# Patient Record
Sex: Female | Born: 1935 | Race: White | Hispanic: No | Smoking: Former smoker
Health system: Southern US, Community
[De-identification: ages and names within clinical notes are randomized; demographics above are authoritative.]

## PROBLEM LIST (undated history)

## (undated) DIAGNOSIS — J479 Bronchiectasis, uncomplicated: Secondary | ICD-10-CM

## (undated) DIAGNOSIS — I493 Ventricular premature depolarization: Secondary | ICD-10-CM

## (undated) DIAGNOSIS — K219 Gastro-esophageal reflux disease without esophagitis: Secondary | ICD-10-CM

## (undated) DIAGNOSIS — J40 Bronchitis, not specified as acute or chronic: Secondary | ICD-10-CM

## (undated) DIAGNOSIS — M1710 Unilateral primary osteoarthritis, unspecified knee: Secondary | ICD-10-CM

## (undated) DIAGNOSIS — H9190 Unspecified hearing loss, unspecified ear: Secondary | ICD-10-CM

## (undated) DIAGNOSIS — I4891 Unspecified atrial fibrillation: Secondary | ICD-10-CM

## (undated) DIAGNOSIS — G4700 Insomnia, unspecified: Secondary | ICD-10-CM

## (undated) DIAGNOSIS — Z9289 Personal history of other medical treatment: Secondary | ICD-10-CM

## (undated) DIAGNOSIS — M179 Osteoarthritis of knee, unspecified: Secondary | ICD-10-CM

## (undated) DIAGNOSIS — H9319 Tinnitus, unspecified ear: Secondary | ICD-10-CM

## (undated) HISTORY — PX: OTHER SURGICAL HISTORY: SHX169

## (undated) HISTORY — DX: Gastro-esophageal reflux disease without esophagitis: K21.9

## (undated) HISTORY — DX: Bronchitis, not specified as acute or chronic: J40

## (undated) HISTORY — PX: BREAST EXCISIONAL BIOPSY: SUR124

## (undated) HISTORY — DX: Personal history of other medical treatment: Z92.89

## (undated) HISTORY — DX: Unspecified hearing loss, unspecified ear: H91.90

## (undated) HISTORY — DX: Tinnitus, unspecified ear: H93.19

## (undated) HISTORY — DX: Unilateral primary osteoarthritis, unspecified knee: M17.10

## (undated) HISTORY — DX: Osteoarthritis of knee, unspecified: M17.9

## (undated) HISTORY — DX: Insomnia, unspecified: G47.00

## (undated) HISTORY — DX: Bronchiectasis, uncomplicated: J47.9

## (undated) HISTORY — DX: Ventricular premature depolarization: I49.3

---

## 1999-08-25 ENCOUNTER — Encounter: Payer: Self-pay | Admitting: Family Medicine

## 1999-08-25 ENCOUNTER — Encounter: Admission: RE | Admit: 1999-08-25 | Discharge: 1999-08-25 | Payer: Self-pay | Admitting: Family Medicine

## 1999-12-02 ENCOUNTER — Other Ambulatory Visit: Admission: RE | Admit: 1999-12-02 | Discharge: 1999-12-02 | Payer: Self-pay | Admitting: Internal Medicine

## 1999-12-06 ENCOUNTER — Encounter: Admission: RE | Admit: 1999-12-06 | Discharge: 1999-12-06 | Payer: Self-pay | Admitting: Internal Medicine

## 1999-12-06 ENCOUNTER — Encounter: Payer: Self-pay | Admitting: Internal Medicine

## 2000-08-27 ENCOUNTER — Encounter: Admission: RE | Admit: 2000-08-27 | Discharge: 2000-08-27 | Payer: Self-pay | Admitting: Internal Medicine

## 2000-08-27 ENCOUNTER — Encounter: Payer: Self-pay | Admitting: Internal Medicine

## 2000-08-29 ENCOUNTER — Encounter: Admission: RE | Admit: 2000-08-29 | Discharge: 2000-08-29 | Payer: Self-pay | Admitting: Internal Medicine

## 2000-08-29 ENCOUNTER — Encounter: Payer: Self-pay | Admitting: Internal Medicine

## 2000-12-03 ENCOUNTER — Other Ambulatory Visit: Admission: RE | Admit: 2000-12-03 | Discharge: 2000-12-03 | Payer: Self-pay | Admitting: Internal Medicine

## 2001-02-14 ENCOUNTER — Ambulatory Visit (HOSPITAL_COMMUNITY): Admission: RE | Admit: 2001-02-14 | Discharge: 2001-02-14 | Payer: Self-pay | Admitting: Gastroenterology

## 2001-09-02 ENCOUNTER — Encounter: Admission: RE | Admit: 2001-09-02 | Discharge: 2001-09-02 | Payer: Self-pay | Admitting: Internal Medicine

## 2001-09-02 ENCOUNTER — Encounter: Payer: Self-pay | Admitting: Internal Medicine

## 2002-09-11 ENCOUNTER — Encounter: Payer: Self-pay | Admitting: Internal Medicine

## 2002-09-11 ENCOUNTER — Encounter: Admission: RE | Admit: 2002-09-11 | Discharge: 2002-09-11 | Payer: Self-pay | Admitting: Internal Medicine

## 2002-12-09 ENCOUNTER — Other Ambulatory Visit: Admission: RE | Admit: 2002-12-09 | Discharge: 2002-12-09 | Payer: Self-pay | Admitting: Internal Medicine

## 2003-09-14 ENCOUNTER — Encounter: Admission: RE | Admit: 2003-09-14 | Discharge: 2003-09-14 | Payer: Self-pay | Admitting: Internal Medicine

## 2004-05-27 ENCOUNTER — Encounter: Admission: RE | Admit: 2004-05-27 | Discharge: 2004-05-27 | Payer: Self-pay | Admitting: Internal Medicine

## 2004-10-05 ENCOUNTER — Encounter: Admission: RE | Admit: 2004-10-05 | Discharge: 2004-10-05 | Payer: Self-pay | Admitting: Internal Medicine

## 2004-12-12 ENCOUNTER — Other Ambulatory Visit: Admission: RE | Admit: 2004-12-12 | Discharge: 2004-12-12 | Payer: Self-pay | Admitting: Internal Medicine

## 2005-12-05 ENCOUNTER — Encounter: Admission: RE | Admit: 2005-12-05 | Discharge: 2005-12-05 | Payer: Self-pay | Admitting: Internal Medicine

## 2006-01-15 ENCOUNTER — Encounter: Admission: RE | Admit: 2006-01-15 | Discharge: 2006-01-15 | Payer: Self-pay | Admitting: Internal Medicine

## 2006-03-06 ENCOUNTER — Encounter: Admission: RE | Admit: 2006-03-06 | Discharge: 2006-03-06 | Payer: Self-pay | Admitting: Internal Medicine

## 2006-03-29 DIAGNOSIS — Z9289 Personal history of other medical treatment: Secondary | ICD-10-CM

## 2006-03-29 HISTORY — DX: Personal history of other medical treatment: Z92.89

## 2006-12-10 ENCOUNTER — Encounter: Admission: RE | Admit: 2006-12-10 | Discharge: 2006-12-10 | Payer: Self-pay | Admitting: Internal Medicine

## 2006-12-18 ENCOUNTER — Other Ambulatory Visit: Admission: RE | Admit: 2006-12-18 | Discharge: 2006-12-18 | Payer: Self-pay | Admitting: Internal Medicine

## 2007-12-17 ENCOUNTER — Encounter: Admission: RE | Admit: 2007-12-17 | Discharge: 2007-12-17 | Payer: Self-pay | Admitting: Internal Medicine

## 2008-12-21 ENCOUNTER — Encounter: Admission: RE | Admit: 2008-12-21 | Discharge: 2008-12-21 | Payer: Self-pay | Admitting: Internal Medicine

## 2009-02-19 ENCOUNTER — Other Ambulatory Visit: Admission: RE | Admit: 2009-02-19 | Discharge: 2009-02-19 | Payer: Self-pay | Admitting: Internal Medicine

## 2009-03-31 ENCOUNTER — Inpatient Hospital Stay (HOSPITAL_BASED_OUTPATIENT_CLINIC_OR_DEPARTMENT_OTHER): Admission: RE | Admit: 2009-03-31 | Discharge: 2009-03-31 | Payer: Self-pay | Admitting: Interventional Cardiology

## 2009-12-27 ENCOUNTER — Encounter: Admission: RE | Admit: 2009-12-27 | Discharge: 2009-12-27 | Payer: Self-pay | Admitting: Internal Medicine

## 2010-10-14 NOTE — Procedures (Signed)
[place]  Patient:    [name], [name] Visit Number: [id number] MRN: [id number]          Service Type: Attending:  Verlin Grills, M.D. Dictated by:   Verlin Grills, M.D. Proc. Date: 02/14/01   CC:         Pearla Dubonnet, M.D.   Procedure Report  DATE OF BIRTH:  December 26, 1930  REFERRING PHYSICIAN:  Pearla Dubonnet, M.D.  PROCEDURE PERFORMED:  Screening colonoscopy to the hepatic flexure.  ENDOSCOPIST:  Verlin Grills, M.D.  INDICATIONS FOR PROCEDURE:  The patient is a 77 year old female who is referred for her first screening colonoscopy with possible polypectomy to prevent colon cancer.  PREMEDICATION:  Fentanyl 50 mcg, Versed 7 mg.  ENDOSCOPE:  Olympus pediatric video colonoscope.  DESCRIPTION OF PROCEDURE:  After obtaining informed consent, the patient was placed in the left lateral decubitus position.  I administered intravenous Versed and intravenous Versed to achieve conscious sedation for the procedure.  The patients blood pressure, oxygen saturation and cardiac rhythm were monitored throughout the procedure and documented in the medical record.  Anal inspection was normal.  Digital rectal exam was normal.  The Olympus pediatric video colonoscope was then introduced into the rectum and under direct vision, advanced to the hepatic flexure.  Due to colonic loop formation, I was unable to intubate the right colon and I was unable to examine the ascending colon, cecum or ileocecal valve.  Colonic preparation for the exam today was excellent.  Rectum:  Normal.  Sigmoid colon and descending colon:  Normal.  Splenic flexure:  Normal.  Transverse colon:  Normal.  Hepatic flexure:  Normal.  ASSESSMENT:  Normal screening proctocolonoscopy to the hepatic flexure.  No endoscopic evidence for the presence of colorectal neoplasia. Dictated by:   Verlin Grills, M.D. Attending:  Verlin Grills, M.D. DD:  02/14/01 TD:  02/14/01 Job: [id number]

## 2010-11-28 ENCOUNTER — Other Ambulatory Visit: Payer: Self-pay | Admitting: Internal Medicine

## 2010-11-28 DIAGNOSIS — Z1231 Encounter for screening mammogram for malignant neoplasm of breast: Secondary | ICD-10-CM

## 2011-01-02 ENCOUNTER — Ambulatory Visit
Admission: RE | Admit: 2011-01-02 | Discharge: 2011-01-02 | Disposition: A | Discharge status: Home / Self Care | Payer: PRIVATE HEALTH INSURANCE | Source: Ambulatory Visit | Attending: Internal Medicine | Admitting: Internal Medicine

## 2011-01-02 DIAGNOSIS — Z1231 Encounter for screening mammogram for malignant neoplasm of breast: Secondary | ICD-10-CM

## 2011-04-07 ENCOUNTER — Other Ambulatory Visit: Payer: Self-pay | Admitting: Internal Medicine

## 2011-04-07 ENCOUNTER — Ambulatory Visit
Admission: RE | Admit: 2011-04-07 | Discharge: 2011-04-07 | Disposition: A | Discharge status: Home / Self Care | Payer: Medicare Other | Source: Ambulatory Visit | Attending: Internal Medicine | Admitting: Internal Medicine

## 2011-06-13 DIAGNOSIS — R05 Cough: Secondary | ICD-10-CM | POA: Diagnosis not present

## 2011-07-07 DIAGNOSIS — IMO0002 Reserved for concepts with insufficient information to code with codable children: Secondary | ICD-10-CM | POA: Insufficient documentation

## 2011-07-07 DIAGNOSIS — H2510 Age-related nuclear cataract, unspecified eye: Secondary | ICD-10-CM | POA: Diagnosis not present

## 2011-09-07 DIAGNOSIS — I10 Essential (primary) hypertension: Secondary | ICD-10-CM | POA: Diagnosis not present

## 2011-09-07 DIAGNOSIS — E782 Mixed hyperlipidemia: Secondary | ICD-10-CM | POA: Diagnosis not present

## 2011-09-07 DIAGNOSIS — Z1331 Encounter for screening for depression: Secondary | ICD-10-CM | POA: Diagnosis not present

## 2011-09-07 DIAGNOSIS — Z0000 Encounter for general adult medical examination without abnormal findings: Secondary | ICD-10-CM | POA: Diagnosis not present

## 2011-09-07 DIAGNOSIS — J479 Bronchiectasis, uncomplicated: Secondary | ICD-10-CM | POA: Diagnosis not present

## 2011-09-07 DIAGNOSIS — H9190 Unspecified hearing loss, unspecified ear: Secondary | ICD-10-CM | POA: Diagnosis not present

## 2011-09-07 DIAGNOSIS — M1990 Unspecified osteoarthritis, unspecified site: Secondary | ICD-10-CM | POA: Diagnosis not present

## 2011-09-07 DIAGNOSIS — Z79899 Other long term (current) drug therapy: Secondary | ICD-10-CM | POA: Diagnosis not present

## 2011-09-07 DIAGNOSIS — E559 Vitamin D deficiency, unspecified: Secondary | ICD-10-CM | POA: Diagnosis not present

## 2011-12-05 ENCOUNTER — Other Ambulatory Visit: Payer: Self-pay | Admitting: Internal Medicine

## 2011-12-05 DIAGNOSIS — Z1231 Encounter for screening mammogram for malignant neoplasm of breast: Secondary | ICD-10-CM

## 2012-01-08 ENCOUNTER — Ambulatory Visit
Admission: RE | Admit: 2012-01-08 | Discharge: 2012-01-08 | Disposition: A | Discharge status: Home / Self Care | Payer: Medicare Other | Source: Ambulatory Visit | Attending: Internal Medicine | Admitting: Internal Medicine

## 2012-01-08 DIAGNOSIS — Z1231 Encounter for screening mammogram for malignant neoplasm of breast: Secondary | ICD-10-CM | POA: Diagnosis not present

## 2012-03-25 DIAGNOSIS — I8390 Asymptomatic varicose veins of unspecified lower extremity: Secondary | ICD-10-CM | POA: Diagnosis not present

## 2012-03-25 DIAGNOSIS — H6120 Impacted cerumen, unspecified ear: Secondary | ICD-10-CM | POA: Diagnosis not present

## 2012-03-25 DIAGNOSIS — Z23 Encounter for immunization: Secondary | ICD-10-CM | POA: Diagnosis not present

## 2012-07-15 DIAGNOSIS — R0789 Other chest pain: Secondary | ICD-10-CM | POA: Diagnosis not present

## 2012-07-15 DIAGNOSIS — R634 Abnormal weight loss: Secondary | ICD-10-CM | POA: Diagnosis not present

## 2012-07-15 DIAGNOSIS — R002 Palpitations: Secondary | ICD-10-CM | POA: Diagnosis not present

## 2012-07-17 DIAGNOSIS — R002 Palpitations: Secondary | ICD-10-CM | POA: Diagnosis not present

## 2012-07-18 DIAGNOSIS — R002 Palpitations: Secondary | ICD-10-CM | POA: Diagnosis not present

## 2012-07-18 DIAGNOSIS — R0789 Other chest pain: Secondary | ICD-10-CM | POA: Diagnosis not present

## 2012-07-18 DIAGNOSIS — R634 Abnormal weight loss: Secondary | ICD-10-CM | POA: Diagnosis not present

## 2012-07-19 DIAGNOSIS — I491 Atrial premature depolarization: Secondary | ICD-10-CM | POA: Diagnosis not present

## 2012-07-19 DIAGNOSIS — I6529 Occlusion and stenosis of unspecified carotid artery: Secondary | ICD-10-CM | POA: Diagnosis not present

## 2012-07-19 DIAGNOSIS — R079 Chest pain, unspecified: Secondary | ICD-10-CM | POA: Diagnosis not present

## 2012-07-19 DIAGNOSIS — R002 Palpitations: Secondary | ICD-10-CM | POA: Diagnosis not present

## 2012-08-21 DIAGNOSIS — I491 Atrial premature depolarization: Secondary | ICD-10-CM | POA: Diagnosis not present

## 2012-08-21 DIAGNOSIS — R079 Chest pain, unspecified: Secondary | ICD-10-CM | POA: Diagnosis not present

## 2012-09-06 DIAGNOSIS — H2510 Age-related nuclear cataract, unspecified eye: Secondary | ICD-10-CM | POA: Diagnosis not present

## 2012-09-30 DIAGNOSIS — Z1331 Encounter for screening for depression: Secondary | ICD-10-CM | POA: Diagnosis not present

## 2012-09-30 DIAGNOSIS — I10 Essential (primary) hypertension: Secondary | ICD-10-CM | POA: Diagnosis not present

## 2012-09-30 DIAGNOSIS — E782 Mixed hyperlipidemia: Secondary | ICD-10-CM | POA: Diagnosis not present

## 2012-09-30 DIAGNOSIS — I491 Atrial premature depolarization: Secondary | ICD-10-CM | POA: Diagnosis not present

## 2012-09-30 DIAGNOSIS — J479 Bronchiectasis, uncomplicated: Secondary | ICD-10-CM | POA: Diagnosis not present

## 2012-09-30 DIAGNOSIS — Z0000 Encounter for general adult medical examination without abnormal findings: Secondary | ICD-10-CM | POA: Diagnosis not present

## 2012-09-30 DIAGNOSIS — M1990 Unspecified osteoarthritis, unspecified site: Secondary | ICD-10-CM | POA: Diagnosis not present

## 2012-09-30 DIAGNOSIS — I8390 Asymptomatic varicose veins of unspecified lower extremity: Secondary | ICD-10-CM | POA: Diagnosis not present

## 2012-09-30 DIAGNOSIS — E559 Vitamin D deficiency, unspecified: Secondary | ICD-10-CM | POA: Diagnosis not present

## 2012-11-01 DIAGNOSIS — D485 Neoplasm of uncertain behavior of skin: Secondary | ICD-10-CM | POA: Diagnosis not present

## 2012-11-01 DIAGNOSIS — L219 Seborrheic dermatitis, unspecified: Secondary | ICD-10-CM | POA: Diagnosis not present

## 2012-12-16 ENCOUNTER — Other Ambulatory Visit: Payer: Self-pay

## 2012-12-16 DIAGNOSIS — Z1231 Encounter for screening mammogram for malignant neoplasm of breast: Secondary | ICD-10-CM

## 2013-01-21 ENCOUNTER — Ambulatory Visit: Payer: Medicare Other

## 2013-01-29 ENCOUNTER — Ambulatory Visit
Admission: RE | Admit: 2013-01-29 | Discharge: 2013-01-29 | Disposition: A | Discharge status: Home / Self Care | Payer: Medicare Other | Source: Ambulatory Visit

## 2013-01-29 DIAGNOSIS — Z1231 Encounter for screening mammogram for malignant neoplasm of breast: Secondary | ICD-10-CM

## 2013-03-15 DIAGNOSIS — Z23 Encounter for immunization: Secondary | ICD-10-CM | POA: Diagnosis not present

## 2013-04-10 ENCOUNTER — Encounter: Payer: Self-pay | Admitting: Interventional Cardiology

## 2013-07-04 DIAGNOSIS — R002 Palpitations: Secondary | ICD-10-CM | POA: Diagnosis not present

## 2013-07-04 DIAGNOSIS — F411 Generalized anxiety disorder: Secondary | ICD-10-CM | POA: Diagnosis not present

## 2013-07-18 ENCOUNTER — Encounter: Payer: Self-pay | Admitting: Cardiology

## 2013-07-18 ENCOUNTER — Ambulatory Visit: Payer: Medicare Other | Admitting: Interventional Cardiology

## 2013-07-18 DIAGNOSIS — I491 Atrial premature depolarization: Secondary | ICD-10-CM | POA: Insufficient documentation

## 2013-07-18 DIAGNOSIS — I6529 Occlusion and stenosis of unspecified carotid artery: Secondary | ICD-10-CM

## 2013-07-22 ENCOUNTER — Ambulatory Visit: Payer: Medicare Other | Admitting: Interventional Cardiology

## 2013-07-29 ENCOUNTER — Other Ambulatory Visit: Payer: Self-pay | Admitting: Interventional Cardiology

## 2013-09-08 DIAGNOSIS — H5200 Hypermetropia, unspecified eye: Secondary | ICD-10-CM | POA: Diagnosis not present

## 2013-09-08 DIAGNOSIS — H01009 Unspecified blepharitis unspecified eye, unspecified eyelid: Secondary | ICD-10-CM | POA: Diagnosis not present

## 2013-09-08 DIAGNOSIS — H2589 Other age-related cataract: Secondary | ICD-10-CM | POA: Diagnosis not present

## 2013-09-08 DIAGNOSIS — H04129 Dry eye syndrome of unspecified lacrimal gland: Secondary | ICD-10-CM | POA: Diagnosis not present

## 2013-09-08 DIAGNOSIS — H524 Presbyopia: Secondary | ICD-10-CM | POA: Diagnosis not present

## 2013-09-08 DIAGNOSIS — H52209 Unspecified astigmatism, unspecified eye: Secondary | ICD-10-CM | POA: Diagnosis not present

## 2013-09-11 ENCOUNTER — Ambulatory Visit (INDEPENDENT_AMBULATORY_CARE_PROVIDER_SITE_OTHER): Payer: Medicare Other | Admitting: Interventional Cardiology

## 2013-09-11 ENCOUNTER — Encounter: Payer: Self-pay | Admitting: Interventional Cardiology

## 2013-09-11 VITALS — BP 150/64 | HR 62 | Ht 62.0 in | Wt 136.0 lb

## 2013-09-11 DIAGNOSIS — I6529 Occlusion and stenosis of unspecified carotid artery: Secondary | ICD-10-CM

## 2013-09-11 DIAGNOSIS — R002 Palpitations: Secondary | ICD-10-CM

## 2013-09-11 DIAGNOSIS — I491 Atrial premature depolarization: Secondary | ICD-10-CM | POA: Diagnosis not present

## 2013-09-11 NOTE — Progress Notes (Signed)
Patient ID: [name], female   DOB: April 11, 1931, 80 y.o.   MRN: [id number]    [place], [place], [place]  [address] Phone: [phone] Fax:  [phone]  Date:  09/11/2013   ID:  [name], DOB [birth year], MRN [id number]  PCP:  Henrine Screws, MD      History of Present Illness: [name] is a 80 y.o. female who has had a clean cath in 2010. She has had pounding in her chest over the past few months. It is worse in the mornings. We changed your atenolol to metoprolol last year. She had a lifeline screening that reported AFib, but I reviewed the strips and she was in NSR in 2014.   She denies syncope or lightheadedness, but reports palpitations. Sometimes, she feels that she is choking. She does housework and walks up stairs. She has no chest pain but some mild SHOB. She had mild carotid disease on the u/s by lifeline.    Wt Readings from Last 3 Encounters:  09/11/13 136 lb (61.689 kg)     Past Medical History  Diagnosis Date  . DJD (degenerative joint disease) of knee   . Bronchiectasis     with multiple pneumonia back in 1990's  . Bronchitis     episodic  . PVC's (premature ventricular contractions)     on EKG, controlled on atenolol and caffeine reduction  . History of nuclear stress test 11/07    NORMAL  . GERD (gastroesophageal reflux disease)     episodic symptoms  . Hearing loss     chronic moderate hearing loss, worked up by ENT and hearing aids have been recommended  . Tinnitus     chronic  . Insomnia     Current Outpatient Prescriptions  Medication Sig Dispense Refill  . aspirin 81 MG tablet Take 81 mg by mouth daily.      . Cholecalciferol (VITAMIN D) 2000 UNITS CAPS Take 2,000 Units by mouth daily.      Marland Kitchen LORazepam (ATIVAN) 0.5 MG tablet Take 0.5 mg by mouth every 8 (eight) hours.      . metoprolol tartrate (LOPRESSOR) 25 MG tablet TAKE 1 TABLET BY MOUTH TWICE DAILY.  60 tablet  1   No current facility-administered  medications for this visit.    Allergies:    Allergies  Allergen Reactions  . Augmentin [Amoxicillin-Pot Clavulanate] Nausea And Vomiting  . Biaxin [Clarithromycin] Nausea And Vomiting  . Tequin [Gatifloxacin] Nausea Only    Social History:  The patient  reports that she has quit smoking. She does not have any smokeless tobacco history on file.   Family History:  The patient's family history is not on file.   ROS:  Please see the history of present illness.  No nausea, vomiting.  No fevers, chills.  No focal weakness.  No dysuria.    All other systems reviewed and negative.   PHYSICAL EXAM: VS:  BP 150/64  Pulse 62  Ht 5\' 2"  (1.575 m)  Wt 136 lb (61.689 kg)  BMI 24.87 kg/m2 Well nourished, well developed, in no acute distress HEENT: normal Neck: no JVD, no carotid bruits Cardiac:  normal S1, S2; RRR;  Lungs:  clear to auscultation bilaterally, no wheezing, rhonchi or rales Abd: soft, nontender, no hepatomegaly Ext: no edema Skin: warm and dry Neuro:   no focal abnormalities noted  EKG:  NSR, NSST in 3/15  ASSESSMENT AND PLAN:  Chest pain, unspecified : Resolved. Prior  negative stress test.  Negative cath in 2010.  LDL 92 in 5/14. 2. PACs  Stopped Atenolol Tablet, 25 MG, 1 tablet, Orally, BID in 2014 Continue Metoprolol Tartrate Tablet, 25 MG, 1 tablet, Orally, Twice a day, 30 day(s), 60, Refills 11 she feels the palpitations are more frequent. Short run of atrial tachycardia noted on her Holter monitor in 2014.she is willing to try a different beta blocker. lifeline screening reviewed. They reported atrial fibrillation, but I personally reviewed ECG. It  Was normal sinus rhythm. Stress may be the cause of her current palpitations. She has had some improvement with lorazepam. Asked her to talk to Dr. Inda Merlin about long-term antianxiety therapy.  3. Carotid Artery Disease  Lifeline screening reviewed. Mild carotid artery disease without significant stenosis.    Signed, Mina Marble, MD, [facility] 09/11/2013 5:47 PM

## 2013-09-11 NOTE — Patient Instructions (Signed)
Your physician recommends that you continue on your current medications as directed. Please refer to the Current Medication list given to you today.  Your physician wants you to follow-up in: 1 year with Dr. Varanasi. You will receive a reminder letter in the mail two months in advance. If you don't receive a letter, please call our office to schedule the follow-up appointment.  

## 2013-09-29 ENCOUNTER — Other Ambulatory Visit: Payer: Self-pay | Admitting: Interventional Cardiology

## 2013-10-07 DIAGNOSIS — E782 Mixed hyperlipidemia: Secondary | ICD-10-CM | POA: Diagnosis not present

## 2013-10-07 DIAGNOSIS — I491 Atrial premature depolarization: Secondary | ICD-10-CM | POA: Diagnosis not present

## 2013-10-07 DIAGNOSIS — I10 Essential (primary) hypertension: Secondary | ICD-10-CM | POA: Diagnosis not present

## 2013-10-07 DIAGNOSIS — E559 Vitamin D deficiency, unspecified: Secondary | ICD-10-CM | POA: Diagnosis not present

## 2013-10-07 DIAGNOSIS — R002 Palpitations: Secondary | ICD-10-CM | POA: Diagnosis not present

## 2013-10-07 DIAGNOSIS — Z1331 Encounter for screening for depression: Secondary | ICD-10-CM | POA: Diagnosis not present

## 2013-10-07 DIAGNOSIS — I8390 Asymptomatic varicose veins of unspecified lower extremity: Secondary | ICD-10-CM | POA: Diagnosis not present

## 2013-10-07 DIAGNOSIS — Z23 Encounter for immunization: Secondary | ICD-10-CM | POA: Diagnosis not present

## 2013-10-07 DIAGNOSIS — Z0000 Encounter for general adult medical examination without abnormal findings: Secondary | ICD-10-CM | POA: Diagnosis not present

## 2013-12-29 ENCOUNTER — Other Ambulatory Visit: Payer: Self-pay

## 2013-12-29 DIAGNOSIS — Z1231 Encounter for screening mammogram for malignant neoplasm of breast: Secondary | ICD-10-CM

## 2014-01-13 DIAGNOSIS — I498 Other specified cardiac arrhythmias: Secondary | ICD-10-CM | POA: Diagnosis not present

## 2014-01-13 DIAGNOSIS — F411 Generalized anxiety disorder: Secondary | ICD-10-CM | POA: Diagnosis not present

## 2014-01-13 DIAGNOSIS — I10 Essential (primary) hypertension: Secondary | ICD-10-CM | POA: Diagnosis not present

## 2014-01-13 DIAGNOSIS — R002 Palpitations: Secondary | ICD-10-CM | POA: Diagnosis not present

## 2014-02-03 ENCOUNTER — Ambulatory Visit
Admission: RE | Admit: 2014-02-03 | Discharge: 2014-02-03 | Disposition: A | Discharge status: Home / Self Care | Payer: Medicare Other | Source: Ambulatory Visit

## 2014-02-03 DIAGNOSIS — Z1231 Encounter for screening mammogram for malignant neoplasm of breast: Secondary | ICD-10-CM

## 2014-02-20 ENCOUNTER — Other Ambulatory Visit: Payer: Self-pay | Admitting: Internal Medicine

## 2014-02-20 ENCOUNTER — Ambulatory Visit
Admission: RE | Admit: 2014-02-20 | Discharge: 2014-02-20 | Disposition: A | Discharge status: Home / Self Care | Payer: Medicare Other | Source: Ambulatory Visit | Attending: Internal Medicine | Admitting: Internal Medicine

## 2014-02-20 DIAGNOSIS — J984 Other disorders of lung: Secondary | ICD-10-CM | POA: Diagnosis not present

## 2014-02-20 DIAGNOSIS — R079 Chest pain, unspecified: Secondary | ICD-10-CM

## 2014-02-20 DIAGNOSIS — Z87891 Personal history of nicotine dependence: Secondary | ICD-10-CM | POA: Diagnosis not present

## 2014-02-20 DIAGNOSIS — J449 Chronic obstructive pulmonary disease, unspecified: Secondary | ICD-10-CM | POA: Diagnosis not present

## 2014-03-27 DIAGNOSIS — Z23 Encounter for immunization: Secondary | ICD-10-CM | POA: Diagnosis not present

## 2014-04-10 ENCOUNTER — Other Ambulatory Visit: Payer: Self-pay | Admitting: Interventional Cardiology

## 2014-08-17 DIAGNOSIS — L237 Allergic contact dermatitis due to plants, except food: Secondary | ICD-10-CM | POA: Diagnosis not present

## 2014-09-09 DIAGNOSIS — H5201 Hypermetropia, right eye: Secondary | ICD-10-CM | POA: Diagnosis not present

## 2014-09-09 DIAGNOSIS — H5202 Hypermetropia, left eye: Secondary | ICD-10-CM | POA: Diagnosis not present

## 2014-09-09 DIAGNOSIS — H25813 Combined forms of age-related cataract, bilateral: Secondary | ICD-10-CM | POA: Diagnosis not present

## 2014-10-07 ENCOUNTER — Encounter: Payer: Self-pay | Admitting: Interventional Cardiology

## 2014-10-07 ENCOUNTER — Ambulatory Visit (INDEPENDENT_AMBULATORY_CARE_PROVIDER_SITE_OTHER): Payer: Medicare Other | Admitting: Interventional Cardiology

## 2014-10-07 VITALS — BP 158/82 | HR 59 | Ht 62.0 in | Wt 133.0 lb

## 2014-10-07 DIAGNOSIS — I491 Atrial premature depolarization: Secondary | ICD-10-CM

## 2014-10-07 DIAGNOSIS — R002 Palpitations: Secondary | ICD-10-CM

## 2014-10-07 NOTE — Progress Notes (Signed)
Patient ID: [name], female   DOB: 1931/02/21, 81 y.o.   MRN: [id number]     Cardiology Office Note   Date:  10/07/2014   ID:  [name], DOB Dec 28, 1930, MRN [id number]  PCP:  Henrine Screws, MD    No chief complaint on file.    Wt Readings from Last 3 Encounters:  10/07/14 133 lb (60.328 kg)  09/11/13 136 lb (61.689 kg)       History of Present Illness: [name] is a 81 y.o. female  who has had a clean cath in 2010. She has had pounding in her chest over the past few months. No longer worse in the mornings. She had a lifeline screening that reported AFib, but I reviewed the strips and she was in NSR in 2014.   She denies syncope or lightheadedness, but reports palpitations. Sometimes, she feels that she is choking. She does housework and walks up stairs. She has no chest pain but some mild SHOB. She had mild carotid disease on the u/s by lifeline.  She saw her PMD who felt this choking and punding feeling was anxiety.  She was treated with Lorazepam, as needed.  This does help. She does feel that the heart beat is irregular at times.   Holter in 2014 showed only PACs.     Past Medical History  Diagnosis Date  . DJD (degenerative joint disease) of knee   . Bronchiectasis     with multiple pneumonia back in 1990's  . Bronchitis     episodic  . PVC's (premature ventricular contractions)     on EKG, controlled on atenolol and caffeine reduction  . History of nuclear stress test 11/07    NORMAL  . GERD (gastroesophageal reflux disease)     episodic symptoms  . Hearing loss     chronic moderate hearing loss, worked up by ENT and hearing aids have been recommended  . Tinnitus     chronic  . Insomnia     No past surgical history on file.   Current Outpatient Prescriptions  Medication Sig Dispense Refill  . aspirin 81 MG tablet Take 81 mg by mouth daily.    . Cholecalciferol (VITAMIN D) 2000 UNITS CAPS Take 2,000 Units by mouth daily.    .  metoprolol tartrate (LOPRESSOR) 25 MG tablet TAKE 1 TABLET BY MOUTH TWICE DAILY 60 tablet 5   No current facility-administered medications for this visit.    Allergies:   Augmentin; Biaxin; and Tequin    Social History:  The patient  reports that she has quit smoking. She does not have any smokeless tobacco history on file. She reports that she drinks alcohol. She reports that she does not use illicit drugs.   Family History:  The patient's *family history includes Hypertension in her father and mother; Stroke in her father and mother.    ROS:  Please see the history of present illness.   Otherwise, review of systems are positive for palpitations.   All other systems are reviewed and negative.    PHYSICAL EXAM: VS:  BP 158/82 mmHg  Pulse 59  Ht 5\' 2"  (1.575 m)  Wt 133 lb (60.328 kg)  BMI 24.32 kg/m2 , BMI Body mass index is 24.32 kg/(m^2). GEN: Well nourished, well developed, in no acute distress HEENT: normal Neck: no JVD, carotid bruits, or masses Cardiac: RRR; no murmurs, rubs, or gallops,no edema  Respiratory:  clear to auscultation bilaterally, normal work of breathing GI: soft, nontender,  nondistended, + BS MS: no deformity or atrophy Skin: warm and dry, no rash Neuro:  Strength and sensation are intact Psych: euthymic mood, full affect   EKG:   The ekg ordered today demonstrates SB, no ST segment changes, borderline RAE.   Recent Labs: No results found for requested labs within last 365 days.   Lipid Panel No results found for: CHOL, TRIG, HDL, CHOLHDL, VLDL, LDLCALC, LDLDIRECT   Other studies Reviewed: Additional studies/ records that were reviewed today with results demonstrating: as above.   ASSESSMENT AND PLAN:  Chest pain, unspecified : Resolved. Prior negative stress test. Negative cath in 2010. LDL 92 in 5/14. 2. PACs  Stopped Atenolol Tablet, 25 MG, 1 tablet, Orally, BID in 2014 Continue Metoprolol Tartrate Tablet, 25 MG, 1 tablet, Orally, Twice  a day, 30 day(s), 60, Refills 11 she feels the palpitations are frequent, several times a week. Short run of atrial tachycardia noted on her Holter monitor in 2014.  She has had some improvement with lorazepam. Asked her to talk to Dr. Inda Merlin about long-term antianxiety therapy.  3. Carotid Artery Disease  Lifeline screening reviewed. Mild carotid artery disease without significant stenosis.    Current medicines are reviewed at length with the patient today.  The patient concerns regarding her medicines were addressed.  The following changes have been made:  No change  Labs/ tests ordered today include:  No orders of the defined types were placed in this encounter.    Recommend 150 minutes/week of aerobic exercise Low fat, low carb, high fiber diet recommended  Disposition:   FU in one year.  Offered prn f/u but she would like to be seen.    Teresita Madura., MD  10/07/2014 4:21 PM    [facility] [place], [place], [place]  [address] Phone: [phone]; Fax: [phone]

## 2014-10-07 NOTE — Patient Instructions (Addendum)
Medication Instructions:  Your physician recommends that you continue on your current medications as directed. Please refer to the Current Medication list given to you today.   Labwork: NONE  Testing/Procedures: NONE  Follow-Up: Your physician wants you to follow-up in: 12 months with Dr. Irish Lack. You will receive a reminder letter in the mail two months in advance. If you don't receive a letter, please call our office to schedule the follow-up appointment.   Any Other Special Instructions Will Be Listed Below (If Applicable).

## 2014-10-11 ENCOUNTER — Other Ambulatory Visit: Payer: Self-pay | Admitting: Interventional Cardiology

## 2014-11-10 ENCOUNTER — Other Ambulatory Visit (HOSPITAL_COMMUNITY): Payer: Self-pay | Admitting: Respiratory Therapy

## 2014-11-10 ENCOUNTER — Ambulatory Visit
Admission: RE | Admit: 2014-11-10 | Discharge: 2014-11-10 | Disposition: A | Discharge status: Home / Self Care | Payer: Medicare Other | Source: Ambulatory Visit | Attending: Internal Medicine | Admitting: Internal Medicine

## 2014-11-10 ENCOUNTER — Other Ambulatory Visit: Payer: Self-pay | Admitting: Internal Medicine

## 2014-11-10 DIAGNOSIS — E559 Vitamin D deficiency, unspecified: Secondary | ICD-10-CM | POA: Diagnosis not present

## 2014-11-10 DIAGNOSIS — I10 Essential (primary) hypertension: Secondary | ICD-10-CM | POA: Diagnosis not present

## 2014-11-10 DIAGNOSIS — J479 Bronchiectasis, uncomplicated: Secondary | ICD-10-CM | POA: Diagnosis not present

## 2014-11-10 DIAGNOSIS — E782 Mixed hyperlipidemia: Secondary | ICD-10-CM | POA: Diagnosis not present

## 2014-11-10 DIAGNOSIS — R0989 Other specified symptoms and signs involving the circulatory and respiratory systems: Secondary | ICD-10-CM | POA: Diagnosis not present

## 2014-11-10 DIAGNOSIS — R5383 Other fatigue: Secondary | ICD-10-CM | POA: Diagnosis not present

## 2014-11-10 DIAGNOSIS — Z0001 Encounter for general adult medical examination with abnormal findings: Secondary | ICD-10-CM | POA: Diagnosis not present

## 2014-11-10 DIAGNOSIS — Z79899 Other long term (current) drug therapy: Secondary | ICD-10-CM | POA: Diagnosis not present

## 2014-11-10 DIAGNOSIS — Z1389 Encounter for screening for other disorder: Secondary | ICD-10-CM | POA: Diagnosis not present

## 2014-11-10 DIAGNOSIS — E538 Deficiency of other specified B group vitamins: Secondary | ICD-10-CM | POA: Diagnosis not present

## 2014-11-10 DIAGNOSIS — R001 Bradycardia, unspecified: Secondary | ICD-10-CM | POA: Diagnosis not present

## 2014-11-10 DIAGNOSIS — R05 Cough: Secondary | ICD-10-CM | POA: Diagnosis not present

## 2014-11-10 DIAGNOSIS — R0600 Dyspnea, unspecified: Secondary | ICD-10-CM | POA: Diagnosis not present

## 2014-11-20 ENCOUNTER — Ambulatory Visit (HOSPITAL_COMMUNITY)
Admission: RE | Admit: 2014-11-20 | Discharge: 2014-11-20 | Disposition: A | Discharge status: Home / Self Care | Payer: Medicare Other | Source: Ambulatory Visit | Attending: Internal Medicine | Admitting: Internal Medicine

## 2014-11-20 DIAGNOSIS — J479 Bronchiectasis, uncomplicated: Secondary | ICD-10-CM | POA: Diagnosis present

## 2014-11-20 LAB — PULMONARY FUNCTION TEST
DL/VA % pred: 91 %
DL/VA: 4.14 ml/min/mmHg/L
DLCO UNC % PRED: 43 %
DLCO UNC: 9.33 ml/min/mmHg
FEF 25-75 PRE: 0.38 L/s
FEF 25-75 Post: 0.55 L/sec
FEF2575-%Change-Post: 44 %
FEF2575-%Pred-Post: 45 %
FEF2575-%Pred-Pre: 31 %
FEV1-%Change-Post: 19 %
FEV1-%Pred-Post: 63 %
FEV1-%Pred-Pre: 53 %
FEV1-Post: 1.08 L
FEV1-Pre: 0.9 L
FEV1FVC-%Change-Post: 12 %
FEV1FVC-%Pred-Pre: 68 %
FEV6-%CHANGE-POST: 8 %
FEV6-%PRED-POST: 86 %
FEV6-%PRED-PRE: 79 %
FEV6-POST: 1.87 L
FEV6-PRE: 1.73 L
FEV6FVC-%CHANGE-POST: 1 %
FEV6FVC-%Pred-Post: 103 %
FEV6FVC-%Pred-Pre: 102 %
FVC-%Change-Post: 5 %
FVC-%PRED-POST: 83 %
FVC-%Pred-Pre: 78 %
FVC-POST: 1.91 L
FVC-PRE: 1.8 L
POST FEV6/FVC RATIO: 98 %
Post FEV1/FVC ratio: 57 %
Pre FEV1/FVC ratio: 50 %
Pre FEV6/FVC Ratio: 97 %
RV % PRED: 117 %
RV: 2.73 L
TLC % PRED: 98 %
TLC: 4.66 L

## 2014-11-20 MED ORDER — ALBUTEROL SULFATE (2.5 MG/3ML) 0.083% IN NEBU
2.5000 mg | INHALATION_SOLUTION | Freq: Once | RESPIRATORY_TRACT | Status: AC
  Administered 2014-11-20: 2.5 mg via RESPIRATORY_TRACT

## 2014-11-24 DIAGNOSIS — J984 Other disorders of lung: Secondary | ICD-10-CM | POA: Diagnosis not present

## 2014-11-24 DIAGNOSIS — J479 Bronchiectasis, uncomplicated: Secondary | ICD-10-CM | POA: Diagnosis not present

## 2014-11-24 DIAGNOSIS — R0989 Other specified symptoms and signs involving the circulatory and respiratory systems: Secondary | ICD-10-CM | POA: Diagnosis not present

## 2014-12-08 ENCOUNTER — Institutional Professional Consult (permissible substitution): Payer: Medicare Other | Admitting: Pulmonary Disease

## 2014-12-21 DIAGNOSIS — H00013 Hordeolum externum right eye, unspecified eyelid: Secondary | ICD-10-CM | POA: Diagnosis not present

## 2014-12-28 ENCOUNTER — Institutional Professional Consult (permissible substitution): Payer: Medicare Other | Admitting: Pulmonary Disease

## 2014-12-28 ENCOUNTER — Ambulatory Visit (INDEPENDENT_AMBULATORY_CARE_PROVIDER_SITE_OTHER): Payer: Medicare Other | Admitting: Pulmonary Disease

## 2014-12-28 ENCOUNTER — Encounter: Payer: Self-pay | Admitting: *Deleted

## 2014-12-28 ENCOUNTER — Other Ambulatory Visit (INDEPENDENT_AMBULATORY_CARE_PROVIDER_SITE_OTHER): Payer: Medicare Other

## 2014-12-28 VITALS — BP 110/56 | HR 50 | Temp 97.8°F | Ht 62.0 in | Wt 129.0 lb

## 2014-12-28 DIAGNOSIS — J449 Chronic obstructive pulmonary disease, unspecified: Secondary | ICD-10-CM | POA: Diagnosis not present

## 2014-12-28 DIAGNOSIS — R0609 Other forms of dyspnea: Secondary | ICD-10-CM

## 2014-12-28 DIAGNOSIS — H00013 Hordeolum externum right eye, unspecified eyelid: Secondary | ICD-10-CM | POA: Diagnosis not present

## 2014-12-28 LAB — CBC WITH DIFFERENTIAL/PLATELET
BASOS PCT: 0.6 % (ref 0.0–3.0)
Basophils Absolute: 0 10*3/uL (ref 0.0–0.1)
Eosinophils Absolute: 0.1 10*3/uL (ref 0.0–0.7)
Eosinophils Relative: 2.1 % (ref 0.0–5.0)
HCT: 41.8 % (ref 36.0–46.0)
Hemoglobin: 14.2 g/dL (ref 12.0–15.0)
LYMPHS PCT: 37.2 % (ref 12.0–46.0)
Lymphs Abs: 1.8 10*3/uL (ref 0.7–4.0)
MCHC: 34 g/dL (ref 30.0–36.0)
MCV: 86.8 fl (ref 78.0–100.0)
MONOS PCT: 7.6 % (ref 3.0–12.0)
Monocytes Absolute: 0.4 10*3/uL (ref 0.1–1.0)
NEUTROS ABS: 2.6 10*3/uL (ref 1.4–7.7)
Neutrophils Relative %: 52.5 % (ref 43.0–77.0)
Platelets: 170 10*3/uL (ref 150.0–400.0)
RBC: 4.82 Mil/uL (ref 3.87–5.11)
RDW: 14 % (ref 11.5–15.5)
WBC: 4.9 10*3/uL (ref 4.0–10.5)

## 2014-12-28 LAB — SEDIMENTATION RATE: Sed Rate: 16 mm/hr (ref 0–22)

## 2014-12-28 LAB — BASIC METABOLIC PANEL
BUN: 14 mg/dL (ref 6–23)
CO2: 25 mEq/L (ref 19–32)
Calcium: 9.5 mg/dL (ref 8.4–10.5)
Chloride: 105 mEq/L (ref 96–112)
Creatinine, Ser: 0.67 mg/dL (ref 0.40–1.20)
GFR: 89.6 mL/min (ref >60.00)
Glucose, Bld: 102 mg/dL — ABNORMAL HIGH (ref 70–99)
POTASSIUM: 4.1 meq/L (ref 3.5–5.1)
Sodium: 140 mEq/L (ref 135–145)

## 2014-12-28 MED ORDER — BUDESONIDE-FORMOTEROL FUMARATE 160-4.5 MCG/ACT IN AERO: 2.0000 | INHALATION_SPRAY | Freq: Two times a day (BID) | RESPIRATORY_TRACT | Status: DC

## 2014-12-28 NOTE — Patient Instructions (Signed)
[name]- it was great meeting you today...  We reviewed your previous CXR & scan, plus the pulm function test that showed moderate obstructive lung disease...  For further diagnosis we are checking a Hi-resolution CT Chest and some blood work...    We will contact you w/ the results when available...   In the meanwhile> we are going to start an inhaler- SYMBICORT160- 2 sprays twice daily every day...  If needed for thick sputum you may use the OTC MUCINEX 1-2 tabs twice daily w/ fluids...  As we discussed> EXERCISE is very important going forward (a gradually increasing exercise program)...  Let's plan a follow up visit in 6 weeks, sooner if needed for problems.Marland KitchenMarland Kitchen

## 2014-12-29 ENCOUNTER — Telehealth: Payer: Self-pay | Admitting: Pulmonary Disease

## 2014-12-29 NOTE — Telephone Encounter (Signed)
SN, do you want to switch pt to advair since symbicort is not on formulay? Please advise thanks!

## 2014-12-30 LAB — QUANTIFERON TB GOLD ASSAY (BLOOD)
Interferon Gamma Release Assay: NEGATIVE
QUANTIFERON NIL VALUE: 0.1 [IU]/mL
QUANTIFERON TB AG MINUS NIL: 0.03 [IU]/mL
TB AG VALUE: 0.13 [IU]/mL

## 2014-12-30 MED ORDER — FLUTICASONE-SALMETEROL 115-21 MCG/ACT IN AERO: 2.0000 | INHALATION_SPRAY | Freq: Two times a day (BID) | RESPIRATORY_TRACT | Status: DC

## 2014-12-30 NOTE — Telephone Encounter (Signed)
Message printed and placed on  SN cart for review  

## 2014-12-30 NOTE — Telephone Encounter (Signed)
Per SN,  Change to Advair HFA with instructions of 2 puffs BID with prn refills

## 2014-12-30 NOTE — Telephone Encounter (Signed)
Informed pt her insurance won't cover Symbicort and per SN we are sending Advair to her pharmacy. Pt agrees. Nothing further needed. RX sent to pharmacy.

## 2014-12-31 ENCOUNTER — Encounter: Payer: Self-pay | Admitting: Pulmonary Disease

## 2014-12-31 NOTE — Progress Notes (Addendum)
Subjective:     Patient ID: [name], female   DOB: 06-03-1930, 81 y.o.   MRN: [id number]  HPI 81 y/o WF, referred by Dr. Mertha Finders, for evaluation of dyspnea>>       [name] tells me that she was diagnosed w/ bronchiectasis ~15 yrs ago- notes that she had whooping cough as a child (but no known sequelae in childhood) and pneumonia x3 as an adult (in the 1990s she says);  She is not sure of her early course w/ her lung dis & records in Epic show old films from [facility] back to 2003 w/ similar features to current CXRs=> biapical pleuroparenchymal scarring with a +/- stable pattern;  CT Angiogram done 02/2006 showed neg for PE, biapical pleuroparenchymal scarring w/ fibronodular changes in RUL, RML, Lingula felt to represent likely post infectious scarring and NAD;  CXRs done 11/10/14 and 02/20/14 showed sl hyperinflation, flattening of diaphragms, stable biapical & upper lobe scarring, normal heart size, NAD...       Symptomatically she notes SOB/DOE- breathing harder when walking fast or exerting, going on for about 79yr & not really progressive (fairly stable dyspnea), ADLs are OK, prev walked on treadmill for 64min about 33yr ago but now not going to the gym;  She notes some chest tightness, a choking sensation in her throat, hard to get the air "IN" and a yawn seems to help temporarily;  She notes mild cough, sm amt green=>beige sput, w/o hemoptysis/ f/c/s... She is an ex-smoker having started in her teens, smoked off & on, up to 1/2 ppd, and quit in the 1980s- calc about a 15 pack-year smoking hx...       She is followed for CARDS by DrVaranasi> Hx CP & palpit w/ neg stress test and neg cath 2010; ?lifeline screen showed Afib but never confirmed (also showed mild carotid dis); notes SOB, choking, palpit, & Holter in 2014 showed PACs & given Lorazepam by PCP w/ some relief; DrV checks her yearly- on ASA81 and Metop25Bid...       Current meds> Metop25Bid, Lorazepam0.5 prn, VitD  supplement...  EXAM reveals Afeb, VSS, O2sat=97% on RA;  HEENT- neg, mallampati1;  Chest- decr BS bilat, clear w/o w/r/r;  Heart- RR, gr1/6 SEM w/o r/g;  Abd- soft, neg;  Ext- neg w/o c/c/e...  Old CXRs in PACS> 2003/2007 showed biapical pleuroparenchymal scarring with a +/- stable pattern.   CT Angiogram done 02/2006 showed neg for PE, biapical pleuroparenchymal scarring w/ fibronodular changes in RUL, RML, lingula felt to represent likely post infectious scarring and NAD.   CXRs done 11/10/14 and 02/20/14 showed sl hyperinflation, flattening of diaphragms, stable biapical & upper lobe scarring, normal heart size, NAD (osteopenia & mid Tspine compression)...   PFT 11/20/14 showed FVC=1.80 (78%), FEV1=0.90 (53%), %1sec=50, mid-flows reduced at 31% predicted; post bronchodil there was a 19% improvement in FEV1; LungVols- TLC=4.66 (98%), RV=2.73 (117%), RV/TLC=59%; DLCO is reduced at 43% predicted...   LABS 12/28/14:  We checked BMet- wnl;  CBC- wnl w/ 2% eos;  Sed=16;  Quantiferon GOLD= NEG...  Hi-res CT Chest => sched for 01/05/15 & pending      IMP/PLAN>>  72 y/o woman w/ mild smoking hx & quit 30 yrs ago; she has mod obstructive lung dis on PFT w/ reversible component AND decr DLCO; Labs appear neg & QuantGold is neg as well- still could have underlying bronchiectasis & poss atypic mycobactium, awaiting Hi-res CT Chest, consider getting sput for cult/ AFB; In any event her  parenchymal dis does not appear to be very progressive;  REC- trial SYMBICORT160- 2sp Bid and we will recheck pt in 4-6 weeks...   ADDENDUM>>  CT Chest 01/05/15 showed norm heart size, atherosclerosis, sl dilated PA trunck at 3.5cm suggestion PAH, no adenopathy;  Lungs show bilat patchy bronchiectasis & interstitial thickening w/ peribronchvasc nodularity (sl worse than 2007) and no ground glass, reticulation, honeycombing => we will check sput specimen for cult & AFB...    Past Medical History  Diagnosis Date  . DJD  (degenerative joint disease) of knee   . Bronchiectasis     with multiple pneumonia back in 1990's  . Bronchitis     episodic  . PVC's (premature ventricular contractions)     on EKG, controlled on atenolol and caffeine reduction  . History of nuclear stress test 11/07    NORMAL  . GERD (gastroesophageal reflux disease)     episodic symptoms  . Hearing loss     chronic moderate hearing loss, worked up by ENT and hearing aids have been recommended  . Tinnitus     chronic  . Insomnia     Past Surgical History  Procedure Laterality Date  . Cyst removed      left breast    Outpatient Encounter Prescriptions as of 12/28/2014  Medication Sig  . Cholecalciferol (VITAMIN D) 2000 UNITS CAPS Take 2,000 Units by mouth daily.  Marland Kitchen LORazepam (ATIVAN) 0.5 MG tablet As needed  . metoprolol tartrate (LOPRESSOR) 25 MG tablet TAKE 1 TABLET BY MOUTH TWICE DAILY  . budesonide-formoterol (SYMBICORT) 160-4.5 MCG/ACT inhaler Inhale 2 puffs into the lungs 2 (two) times daily.  . [DISCONTINUED] aspirin 81 MG tablet Take 81 mg by mouth daily.   No facility-administered encounter medications on file as of 12/28/2014.    Allergies  Allergen Reactions  . Augmentin [Amoxicillin-Pot Clavulanate] Nausea And Vomiting  . Biaxin [Clarithromycin] Nausea And Vomiting  . Tequin [Gatifloxacin] Nausea Only    Family History  Problem Relation Age of Onset  . Stroke Father   . Hypertension Father   . Stroke Mother   . Hypertension Mother   . Asthma Sister   . Clotting disorder Sister     History   Social History  . Marital Status: Divorced    Spouse Name: N/A  . Number of Children: N/A  . Years of Education: N/A   Occupational History  . retired    Social History Main Topics  . Smoking status: Former Smoker -- 0.50 packs/day for 20 years    Types: Cigarettes    Quit date: 05/29/1988  . Smokeless tobacco: Not on file  . Alcohol Use: 0.0 oz/week    0 Standard drinks or equivalent per week      Comment: 1 can of beer at night  . Drug Use: No  . Sexual Activity: Not on file   Other Topics Concern  . Not on file   Social History Narrative    Current Medications, Allergies, Past Medical History, Past Surgical History, Family History, and Social History were reviewed in [employer] record.   Review of Systems            All symptoms NEG except where BOLDED >>  Constitutional:  F/C/S, fatigue, anorexia, unexpected weight change. HEENT:  HA, visual changes, hearing loss, earache, nasal symptoms, sore throat, mouth sores, hoarseness. Resp:  cough, sputum, hemoptysis; SOB, tightness, wheezing. Cardio:  CP, palpit, DOE, orthopnea, edema. GI:  N/V/D/C, blood in stool; reflux, abd  pain, distention, gas. GU:  dysuria, freq, urgency, hematuria, flank pain, voiding difficulty. MS:  joint pain, swelling, tenderness, decr ROM; neck pain, back pain, etc. Neuro:  HA, tremors, seizures, dizziness, syncope, weakness, numbness, gait abn. Skin:  suspicious lesions or skin rash. Heme:  adenopathy, bruising, bleeding. Psyche:  confusion, agitation, sleep disturbance, hallucinations, anxiety, depression suicidal.   Objective:   Physical Exam      Vital Signs:  Reviewed...  General:  WD, WN, 81 y/o WM in NAD; alert & oriented; pleasant & cooperative... HEENT:  [place]/AT; Conjunctiva- pink, Sclera- nonicteric, EOM-wnl, PERRLA, EACs-clear, TMs-wnl; NOSE-clear; THROAT-clear & wnl. Neck:  Supple w/ fairl ROM; no JVD; normal carotid impulses w/o bruits; no thyromegaly or nodules palpated; no lymphadenopathy. Chest:  Sl decr BS at bases, clear to P & A x few rhonchi; without wheezes, rales, or signs of consolidation... Heart:  Regular Rhythm; norm S1 & S2 without murmurs, rubs, or gallops detected. Abdomen:  Soft & nontender- no guarding or rebound; normal bowel sounds; no organomegaly or masses palpated. Ext:  decrROM; without deformities +arthritic changes; no varicose veins,  venous insuffic, or edema;  Pulses intact w/o bruits. Neuro:  No focal neuro deficits; sensory testing normal; gait normal & balance OK. Derm:  No lesions noted; no rash etc. Lymph:  No cervical, supraclavicular, axillary, or inguinal adenopathy palpated.   Assessment:      IMP >>  Mod obstructive lung dis w/ a revers component & GOLD Stage3 COPD on PFTs>  We will start SYMBICORT160- 2sp Bid R/O Bronchiectasis, MAI infection, [place] syndrome> awaiting the hi-res CT Chest, then consider collecting sput for AFB...   PLAN >> 56 y/o woman w/ mild smoking hx & quit 30 yrs ago; she has mod obstructive lung dis on PFT w/ reversible component AND decr DLCO; Labs appear neg & QuantGold is neg as well- still could have underlying bronchiectasis & poss atypic mycobactium, awaiting Hi-res CT Chest, consider getting sput for cult/ AFB; In any event her parenchymal dis does not appear to be very progressive;  REC- trial SYMBICORT160- 2sp Bid and we will recheck pt in 4-6 weeks     Plan:     Patient's Medications  New Prescriptions   BUDESONIDE-FORMOTEROL (SYMBICORT) 160-4.5 MCG/ACT INHALER    Inhale 2 puffs into the lungs 2 (two) times daily.   FLUTICASONE-SALMETEROL (ADVAIR HFA) 115-21 MCG/ACT INHALER    Inhale 2 puffs into the lungs 2 (two) times daily.  Previous Medications   CHOLECALCIFEROL (VITAMIN D) 2000 UNITS CAPS    Take 2,000 Units by mouth daily.   LORAZEPAM (ATIVAN) 0.5 MG TABLET    As needed   METOPROLOL TARTRATE (LOPRESSOR) 25 MG TABLET    TAKE 1 TABLET BY MOUTH TWICE DAILY  Modified Medications   No medications on file  Discontinued Medications   ASPIRIN 81 MG TABLET    Take 81 mg by mouth daily.

## 2015-01-04 ENCOUNTER — Other Ambulatory Visit: Payer: Self-pay

## 2015-01-04 DIAGNOSIS — Z1231 Encounter for screening mammogram for malignant neoplasm of breast: Secondary | ICD-10-CM

## 2015-01-05 ENCOUNTER — Ambulatory Visit (INDEPENDENT_AMBULATORY_CARE_PROVIDER_SITE_OTHER)
Admission: RE | Admit: 2015-01-05 | Discharge: 2015-01-05 | Disposition: A | Discharge status: Home / Self Care | Payer: Medicare Other | Source: Ambulatory Visit | Attending: Pulmonary Disease | Admitting: Pulmonary Disease

## 2015-01-05 DIAGNOSIS — J449 Chronic obstructive pulmonary disease, unspecified: Secondary | ICD-10-CM

## 2015-01-05 DIAGNOSIS — I700 Atherosclerosis of aorta: Secondary | ICD-10-CM | POA: Diagnosis not present

## 2015-01-05 DIAGNOSIS — J479 Bronchiectasis, uncomplicated: Secondary | ICD-10-CM | POA: Diagnosis not present

## 2015-01-05 DIAGNOSIS — R0609 Other forms of dyspnea: Secondary | ICD-10-CM

## 2015-01-06 ENCOUNTER — Other Ambulatory Visit: Payer: Self-pay | Admitting: Pulmonary Disease

## 2015-01-06 DIAGNOSIS — J449 Chronic obstructive pulmonary disease, unspecified: Secondary | ICD-10-CM

## 2015-01-08 ENCOUNTER — Telehealth: Payer: Self-pay | Admitting: Pulmonary Disease

## 2015-01-08 NOTE — Telephone Encounter (Signed)
Patient came by office to pick up sputum cups to capture samples Patient given cups  Nothing further needed.

## 2015-01-15 ENCOUNTER — Other Ambulatory Visit: Payer: Self-pay | Admitting: Pulmonary Disease

## 2015-01-15 ENCOUNTER — Other Ambulatory Visit: Payer: Medicare Other

## 2015-01-15 DIAGNOSIS — J449 Chronic obstructive pulmonary disease, unspecified: Secondary | ICD-10-CM | POA: Diagnosis not present

## 2015-01-18 LAB — RESPIRATORY CULTURE OR RESPIRATORY AND SPUTUM CULTURE

## 2015-01-19 ENCOUNTER — Other Ambulatory Visit: Payer: Self-pay | Admitting: Pulmonary Disease

## 2015-01-19 ENCOUNTER — Other Ambulatory Visit: Payer: Self-pay

## 2015-01-29 DIAGNOSIS — Z23 Encounter for immunization: Secondary | ICD-10-CM | POA: Diagnosis not present

## 2015-01-29 DIAGNOSIS — J439 Emphysema, unspecified: Secondary | ICD-10-CM | POA: Diagnosis not present

## 2015-01-29 DIAGNOSIS — E782 Mixed hyperlipidemia: Secondary | ICD-10-CM | POA: Diagnosis not present

## 2015-01-29 DIAGNOSIS — J984 Other disorders of lung: Secondary | ICD-10-CM | POA: Diagnosis not present

## 2015-01-29 DIAGNOSIS — F4320 Adjustment disorder, unspecified: Secondary | ICD-10-CM | POA: Diagnosis not present

## 2015-01-29 DIAGNOSIS — J309 Allergic rhinitis, unspecified: Secondary | ICD-10-CM | POA: Diagnosis not present

## 2015-01-29 DIAGNOSIS — E538 Deficiency of other specified B group vitamins: Secondary | ICD-10-CM | POA: Diagnosis not present

## 2015-01-29 DIAGNOSIS — J479 Bronchiectasis, uncomplicated: Secondary | ICD-10-CM | POA: Diagnosis not present

## 2015-01-29 DIAGNOSIS — I10 Essential (primary) hypertension: Secondary | ICD-10-CM | POA: Diagnosis not present

## 2015-01-29 DIAGNOSIS — E559 Vitamin D deficiency, unspecified: Secondary | ICD-10-CM | POA: Diagnosis not present

## 2015-01-29 DIAGNOSIS — H9190 Unspecified hearing loss, unspecified ear: Secondary | ICD-10-CM | POA: Diagnosis not present

## 2015-01-29 DIAGNOSIS — R0989 Other specified symptoms and signs involving the circulatory and respiratory systems: Secondary | ICD-10-CM | POA: Diagnosis not present

## 2015-02-08 ENCOUNTER — Encounter: Payer: Self-pay | Admitting: Pulmonary Disease

## 2015-02-08 ENCOUNTER — Ambulatory Visit (INDEPENDENT_AMBULATORY_CARE_PROVIDER_SITE_OTHER): Payer: Medicare Other | Admitting: Pulmonary Disease

## 2015-02-08 VITALS — BP 110/58 | HR 62 | Temp 98.3°F | Wt 129.4 lb

## 2015-02-08 DIAGNOSIS — J449 Chronic obstructive pulmonary disease, unspecified: Secondary | ICD-10-CM

## 2015-02-08 DIAGNOSIS — Z2239 Carrier of other specified bacterial diseases: Secondary | ICD-10-CM

## 2015-02-08 DIAGNOSIS — J479 Bronchiectasis, uncomplicated: Secondary | ICD-10-CM

## 2015-02-08 NOTE — Progress Notes (Signed)
Subjective:     Patient ID: [name], female   DOB: 30-Sep-1930, 81 y.o.   MRN: [id number]  HPI ~  December 28, 2014:  Initial pulmonary consult by SN>   68 y/o WF, referred by Dr. Mertha Finders, for evaluation of dyspnea>>       [name] tells me that she was diagnosed w/ bronchiectasis ~15 yrs ago- notes that she had whooping cough as a child (but no known sequelae in childhood) and pneumonia x3 as an adult (in the 1990s she says);  She is not sure of her early course w/ her lung dis & records in Epic show old films from [place] office back to 2003 w/ similar features to current CXRs=> biapical pleuroparenchymal scarring with a +/- stable pattern;  CT Angiogram done 02/2006 showed neg for PE, biapical pleuroparenchymal scarring w/ fibronodular changes in RUL, RML, Lingula felt to represent likely post infectious scarring and NAD;  CXRs done 11/10/14 and 02/20/14 showed sl hyperinflation, flattening of diaphragms, stable biapical & upper lobe scarring, normal heart size, NAD...       Symptomatically she notes SOB/DOE- breathing harder when walking fast or exerting, going on for about 92yr & not really progressive (fairly stable dyspnea), ADLs are OK, prev walked on treadmill for 65min about 57yr ago but now not going to the gym;  She notes some chest tightness, a choking sensation in her throat, hard to get the air "IN" and a yawn seems to help temporarily;  She notes mild cough, sm amt green=>beige sput, w/o hemoptysis/ f/c/s... She is an ex-smoker having started in her teens, smoked off & on, up to 1/2 ppd, and quit in the 1980s- calc about a 15 pack-year smoking hx...       She is followed for CARDS by DrVaranasi> Hx CP & palpit w/ neg stress test and neg cath 2010; ?lifeline screen showed Afib but never confirmed (also showed mild carotid dis); notes SOB, choking, palpit, & Holter in 2014 showed PACs & given Lorazepam by PCP w/ some relief; DrV checks her yearly- on ASA81 and Metop25Bid...       Current  meds> Metop25Bid, Lorazepam0.5 prn, VitD supplement... EXAM reveals Afeb, VSS, O2sat=97% on RA;  HEENT- neg, mallampati1;  Chest- decr BS bilat, clear w/o w/r/r;  Heart- RR, gr1/6 SEM w/o r/g;  Abd- soft, neg;  Ext- neg w/o c/c/e...  Old CXRs in PACS> 2003/2007 showed biapical pleuroparenchymal scarring with a +/- stable pattern.   CT Angiogram done 02/2006 showed neg for PE, biapical pleuroparenchymal scarring w/ fibronodular changes in RUL, RML, lingula felt to represent likely post infectious scarring and NAD.   CXRs done 11/10/14 and 02/20/14 showed sl hyperinflation, flattening of diaphragms, stable biapical & upper lobe scarring, normal heart size, NAD (osteopenia & mid Tspine compression)...   PFT 11/20/14 showed FVC=1.80 (78%), FEV1=0.90 (53%), %1sec=50, mid-flows reduced at 31% predicted; post bronchodil there was a 19% improvement in FEV1; LungVols- TLC=4.66 (98%), RV=2.73 (117%), RV/TLC=59%; DLCO is reduced at 43% predicted...   LABS 12/28/14:  We checked BMet- wnl;  CBC- wnl w/ 2% eos;  Sed=16;  Quantiferon GOLD= NEG...  Hi-res CT Chest => sched for 01/05/15 & pending     IMP/PLAN>>  37 y/o woman w/ mild smoking hx & quit 30 yrs ago; she has mod obstructive lung dis on PFT w/ reversible component AND decr DLCO; Labs appear neg & QuantGold is neg as well- still could have underlying bronchiectasis & poss atypic mycobactium, awaiting Hi-res CT  Chest, consider getting sput for cult/ AFB; In any event her parenchymal dis does not appear to be very progressive;  REC- trial SYMBICORT160- 2sp Bid and we will recheck pt in 4-6 weeks...   ADDENDUM>>  CT Chest 01/05/15 showed norm heart size, atherosclerosis, sl dilated PA trunk at 3.5cm suggestion PAH, no adenopathy;  Lungs show bilat patchy bronchiectasis & interstitial thickening w/ peribronchvasc nodularity (sl worse than 2007) and no ground glass, reticulation, honeycombing => we will check sput specimen for cult & AFB... ADDENDUM>>  Sputum C&S  01/15/15 was pos for Pseudomonas (resist to Rocephin, sens to Cipro etc)- likely colonizer;  AFB smears are neg & culture pending...   ~  February 08, 2015:  6wk ROV w/ SN>      [name] reports that her breathing is improved, less SOB, notes that she walked a lot over the weekend at the [place] did well;  She denies much cough, sput, no hemoptysis, no CP, no f/c/s... She remains on Advair115-2spBid and Lorazepam 0.5mg  prn...     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 EXAM reveals Afeb, VSS, O2sat=97% on RA;  HEENT- neg, mallampati1;  Chest- decr BS bilat, clear w/o w/r/r;  Heart- RR, gr1/6 SEM w/o r/g;  Abd- soft, neg;  Ext- neg w/o c/c/e... IMP/PLAN>>  [name] is stable=> improved, asked to continue the Advair115-2spBid; she has already had the 2016 Flu vaccine; we plan ROV in 3-4 moths, sooner if needed...    Past Medical History  Diagnosis Date  . DJD (degenerative joint disease) of knee   . Bronchiectasis     with multiple pneumonia back in 1990's  . Bronchitis     episodic  . PVC's (premature ventricular contractions)     on EKG, controlled on atenolol and caffeine reduction  . History of nuclear stress test 11/07    NORMAL  . GERD (gastroesophageal reflux disease)     episodic symptoms  . Hearing loss     chronic moderate hearing loss, worked up by ENT and hearing aids have been recommended  . Tinnitus     chronic  . Insomnia     Past Surgical History  Procedure Laterality Date  . Cyst removed      left breast    Outpatient Encounter Prescriptions as of 02/08/2015  Medication Sig  . Cholecalciferol (VITAMIN D) 2000 UNITS CAPS Take 2,000 Units by mouth daily.  . fluticasone-salmeterol  (ADVAIR HFA) 115-21 MCG/ACT inhaler Inhale 2 puffs into the lungs 2 (two) times daily.  Marland Kitchen LORazepam (ATIVAN) 0.5 MG tablet As needed  . metoprolol tartrate (LOPRESSOR) 25 MG tablet TAKE 1 TABLET BY MOUTH TWICE DAILY  . [DISCONTINUED] budesonide-formoterol (SYMBICORT) 160-4.5 MCG/ACT inhaler Inhale 2 puffs into the lungs 2 (two) times daily. (Patient not taking: Reported on 02/08/2015)   No facility-administered encounter medications on file as of 02/08/2015.    Allergies  Allergen Reactions  . Augmentin [Amoxicillin-Pot Clavulanate] Nausea And Vomiting  . Biaxin [Clarithromycin] Nausea And Vomiting  . Tequin [Gatifloxacin] Nausea Only    Current Medications, Allergies, Past Medical History, Past Surgical History, Family History, and Social History were reviewed in [employer] record.   Review of Systems  All symptoms NEG except where BOLDED >>  Constitutional:  F/C/S, fatigue, anorexia, unexpected weight change. HEENT:  HA, visual changes, hearing loss, earache, nasal symptoms, sore throat, mouth sores, hoarseness. Resp:  cough, sputum, hemoptysis; SOB, tightness, wheezing. Cardio:  CP, palpit, DOE, orthopnea, edema. GI:  N/V/D/C, blood in stool; reflux, abd pain, distention, gas. GU:  dysuria, freq, urgency, hematuria, flank pain, voiding difficulty. MS:  joint pain, swelling, tenderness, decr ROM; neck pain, back pain, etc. Neuro:  HA, tremors, seizures, dizziness, syncope, weakness, numbness, gait abn. Skin:  suspicious lesions or skin rash. Heme:  adenopathy, bruising, bleeding. Psyche:  confusion, agitation, sleep disturbance, hallucinations, anxiety, depression suicidal.   Objective:   Physical Exam      Vital Signs:  Reviewed...  General:  WD, WN, 81 y/o WM in NAD; alert & oriented; pleasant & cooperative... HEENT:  [place]/AT; Conjunctiva- pink, Sclera- nonicteric, EOM-wnl, PERRLA, EACs-clear, TMs-wnl; NOSE-clear; THROAT-clear & wnl. Neck:   Supple w/ fairl ROM; no JVD; normal carotid impulses w/o bruits; no thyromegaly or nodules palpated; no lymphadenopathy. Chest:  Sl decr BS at bases, clear to P & A x few rhonchi; without wheezes, rales, or signs of consolidation... Heart:  Regular Rhythm; norm S1 & S2 without murmurs, rubs, or gallops detected. Abdomen:  Soft & nontender- no guarding or rebound; normal bowel sounds; no organomegaly or masses palpated. Ext:  decrROM; without deformities +arthritic changes; no varicose veins, venous insuffic, or edema;  Pulses intact w/o bruits. Neuro:  No focal neuro deficits; sensory testing normal; gait normal & balance OK. Derm:  No lesions noted; no rash etc. Lymph:  No cervical, supraclavicular, axillary, or inguinal adenopathy palpated.   Assessment:      IMP >>     Mod obstructive lung dis w/ a revers component & GOLD Stage3 COPD on PFTs> on ADVAIR115-2spBid & she is improved w/ incr exercise tolerance    Bronchiectasis & pleuroparenchymal scarring on CT, r/o MAI infection/ [name] syndrome> AFB cult is neg x1    Pseudomonas colonization> the pseudomonas is sens to Cipro...  PLAN >> 8/1>   81 y/o woman w/ mild smoking hx & quit 30 yrs ago; she has mod obstructive lung dis on PFT w/ reversible component AND decr DLCO; Labs appear neg & QuantGold is neg as well- still could have underlying bronchiectasis & poss atypic mycobactium, awaiting Hi-res CT Chest, consider getting sput for cult/ AFB; In any event her parenchymal dis does not appear to be very progressive;  REC- trial SYMBICORT160- 2sp Bid and we will recheck pt in 4-6 weeks. 9/12>   [place] is stable=> improved, asked to continue the [place]; she has already had the 2016 Flu vaccine; we plan ROV in 3-4 moths, sooner if needed...     Plan:     Patient's Medications  New Prescriptions   No medications on file  Previous Medications   CHOLECALCIFEROL (VITAMIN D) 2000 UNITS CAPS    Take 2,000 Units by mouth daily.     FLUTICASONE-SALMETEROL (ADVAIR HFA) 115-21 MCG/ACT INHALER    Inhale 2 puffs into the lungs 2 (two) times daily.   LORAZEPAM (ATIVAN) 0.5 MG TABLET    As needed   METOPROLOL TARTRATE (LOPRESSOR) 25 MG TABLET    TAKE 1 TABLET BY MOUTH TWICE DAILY  Modified Medications   No medications on file  Discontinued Medications   BUDESONIDE-FORMOTEROL (SYMBICORT) 160-4.5 MCG/ACT INHALER    Inhale 2 puffs into the lungs 2 (two) times daily.

## 2015-02-10 ENCOUNTER — Ambulatory Visit
Admission: RE | Admit: 2015-02-10 | Discharge: 2015-02-10 | Disposition: A | Discharge status: Home / Self Care | Payer: Medicare Other | Source: Ambulatory Visit

## 2015-02-10 DIAGNOSIS — Z1231 Encounter for screening mammogram for malignant neoplasm of breast: Secondary | ICD-10-CM | POA: Diagnosis not present

## 2015-02-22 DIAGNOSIS — Z2239 Carrier of other specified bacterial diseases: Secondary | ICD-10-CM | POA: Insufficient documentation

## 2015-02-22 DIAGNOSIS — J479 Bronchiectasis, uncomplicated: Secondary | ICD-10-CM | POA: Insufficient documentation

## 2015-03-03 LAB — AFB CULTURE WITH SMEAR (NOT AT ARMC): ACID FAST SMEAR: NONE SEEN

## 2015-03-11 ENCOUNTER — Ambulatory Visit (INDEPENDENT_AMBULATORY_CARE_PROVIDER_SITE_OTHER): Payer: Medicare Other | Admitting: Interventional Cardiology

## 2015-03-11 ENCOUNTER — Encounter: Payer: Self-pay | Admitting: Interventional Cardiology

## 2015-03-11 VITALS — BP 124/78 | HR 59 | Ht 62.0 in | Wt 129.2 lb

## 2015-03-11 DIAGNOSIS — I491 Atrial premature depolarization: Secondary | ICD-10-CM | POA: Diagnosis not present

## 2015-03-11 DIAGNOSIS — R42 Dizziness and giddiness: Secondary | ICD-10-CM

## 2015-03-11 NOTE — Progress Notes (Signed)
Patient ID: [name], female   DOB: [birth year], 81 y.o.   MRN: [id number]     Cardiology Office Note   Date:  03/11/2015   ID:  [name], DOB 1931/04/12, MRN [id number]  PCP:  Henrine Screws, MD    Chief Complaint  Patient presents with  . SKIPPED HEART BEATS    CAUSED VISUAL DISTURBANCE     Wt Readings from Last 3 Encounters:  03/11/15 129 lb 3.2 oz (58.605 kg)  02/08/15 129 lb 6.4 oz (58.695 kg)  12/28/14 129 lb (58.514 kg)       History of Present Illness: [name] is a 81 y.o. female  who has had a clean cath in 2010. She had a lifeline screening that reported AFib, but I reviewed the strips and she was in NSR in 2014.   She denies syncope or lightheadedness, but reports palpitations. Sometimes, she feels that she is choking. She does housework and walks up stairs. She has no chest pain but some mild SHOB. She had mild carotid disease on the u/s by lifeline.  She saw her PMD who felt she had anxiety. She was treated with Lorazepam, as needed. This does help. She does feel that the heart beat is irregular at times.   Holter in 2014 showed only PACs.  Had a lightheaded feeling after using Lorazepam once.  She takes that to help her breathing.  She takes it if she is nervous.  She uses it about 2x/week.  She felt her vision decreasing as she was dizzy, lasting a few seconds and then resolving spontaneously.     Past Medical History  Diagnosis Date  . DJD (degenerative joint disease) of knee   . Bronchiectasis ([place])     with multiple pneumonia back in 1990's  . Bronchitis     episodic  . PVC's (premature ventricular contractions)     on EKG, controlled on atenolol and caffeine reduction  . History of nuclear stress test 11/07    NORMAL  . GERD (gastroesophageal reflux disease)     episodic symptoms  . Hearing loss     chronic moderate hearing loss, worked up by ENT and hearing aids have been recommended  . Tinnitus     chronic  . Insomnia      Past Surgical History  Procedure Laterality Date  . Cyst removed      left breast     Current Outpatient Prescriptions  Medication Sig Dispense Refill  . Cholecalciferol (VITAMIN D) 2000 UNITS CAPS Take 2,000 Units by mouth daily.    . fluticasone-salmeterol (ADVAIR HFA) 115-21 MCG/ACT inhaler Inhale 2 puffs into the lungs 2 (two) times daily. 1 Inhaler 5  . LORazepam (ATIVAN) 0.5 MG tablet Take 0.25 mg by mouth 2 (two) times daily as needed for anxiety.   0  . metoprolol tartrate (LOPRESSOR) 25 MG tablet TAKE 1 TABLET BY MOUTH TWICE DAILY 60 tablet 11   No current facility-administered medications for this visit.    Allergies:   Augmentin; Biaxin; and Tequin    Social History:  The patient  reports that she quit smoking about 26 years ago. Her smoking use included Cigarettes. She has a 10 pack-year smoking history. She has never used smokeless tobacco. She reports that she drinks alcohol. She reports that she does not use illicit drugs.   Family History:  The patient's *family history includes Asthma in her sister; Clotting disorder in her sister; Hypertension in her father and mother; Stroke  in her father and mother.    ROS:  Please see the history of present illness.   Otherwise, review of systems are positive for dizzy episode as noted above.   All other systems are reviewed and negative.    PHYSICAL EXAM: VS:  BP 124/78 mmHg  Pulse 59  Ht 5\' 2"  (1.575 m)  Wt 129 lb 3.2 oz (58.605 kg)  BMI 23.63 kg/m2  SpO2 98% , BMI Body mass index is 23.63 kg/(m^2). GEN: Well nourished, well developed, in no acute distress HEENT: normal Neck: no JVD, carotid bruits, or masses Cardiac: RRR; no murmurs, rubs, or gallops,no edema  Respiratory:  clear to auscultation bilaterally, normal work of breathing GI: soft, nontender, nondistended, + BS MS: no deformity or atrophy Skin: warm and dry, no rash Neuro:  Strength and sensation are intact Psych: euthymic mood, full  affect    Recent Labs: 12/28/2014: BUN 14; Creatinine, Ser 0.67; Hemoglobin 14.2; Platelets 170.0; Potassium 4.1; Sodium 140   Lipid Panel No results found for: CHOL, TRIG, HDL, CHOLHDL, VLDL, LDLCALC, LDLDIRECT   Other studies Reviewed: Additional studies/ records that were reviewed today with results demonstrating: Negative cath in 2010.   ASSESSMENT AND PLAN:  1. Dizziness:  May have been related to lorazepam.  SHe has not been drinking water frequently.  If she was a little dehydrated in the setting of lorazepam, this may have caused the episode.  I counseled her regarding eating small meals frequently and staying well-hydrated. She should also be seated when taken lorazepam. 2. Palpitations: Controlled. Prior PACs. 3. No CAD by prior cath in 2010.   Current medicines are reviewed at length with the patient today.  The patient concerns regarding her medicines were addressed.  The following changes have been made:  No change  Labs/ tests ordered today include:  No orders of the defined types were placed in this encounter.    Recommend 150 minutes/week of aerobic exercise Low fat, low carb, high fiber diet recommended  Disposition:   As scheduled   Teresita Madura., MD  03/11/2015 11:20 AM    [facility] [address], [place], [place]  [address] Phone: [phone]; Fax: [phone]

## 2015-03-11 NOTE — Patient Instructions (Signed)
Medication Instructions:  Same-no changes  Labwork: None  Testing/Procedures: None  Follow-Up: Your physician recommends that you schedule a follow-up appointment in: as previously planned

## 2015-05-12 DIAGNOSIS — H9313 Tinnitus, bilateral: Secondary | ICD-10-CM | POA: Diagnosis not present

## 2015-05-12 DIAGNOSIS — H903 Sensorineural hearing loss, bilateral: Secondary | ICD-10-CM | POA: Diagnosis not present

## 2015-06-04 DIAGNOSIS — E559 Vitamin D deficiency, unspecified: Secondary | ICD-10-CM | POA: Diagnosis not present

## 2015-06-04 DIAGNOSIS — I10 Essential (primary) hypertension: Secondary | ICD-10-CM | POA: Diagnosis not present

## 2015-06-04 DIAGNOSIS — J479 Bronchiectasis, uncomplicated: Secondary | ICD-10-CM | POA: Diagnosis not present

## 2015-06-04 DIAGNOSIS — E538 Deficiency of other specified B group vitamins: Secondary | ICD-10-CM | POA: Diagnosis not present

## 2015-06-04 DIAGNOSIS — E782 Mixed hyperlipidemia: Secondary | ICD-10-CM | POA: Diagnosis not present

## 2015-06-04 DIAGNOSIS — R002 Palpitations: Secondary | ICD-10-CM | POA: Diagnosis not present

## 2015-06-04 DIAGNOSIS — J439 Emphysema, unspecified: Secondary | ICD-10-CM | POA: Diagnosis not present

## 2015-06-04 DIAGNOSIS — R001 Bradycardia, unspecified: Secondary | ICD-10-CM | POA: Diagnosis not present

## 2015-06-10 ENCOUNTER — Ambulatory Visit (INDEPENDENT_AMBULATORY_CARE_PROVIDER_SITE_OTHER): Payer: Medicare Other | Admitting: Pulmonary Disease

## 2015-06-10 ENCOUNTER — Encounter: Payer: Self-pay | Admitting: Pulmonary Disease

## 2015-06-10 VITALS — BP 118/60 | HR 50 | Temp 97.7°F | Ht 62.0 in | Wt 128.4 lb

## 2015-06-10 DIAGNOSIS — J479 Bronchiectasis, uncomplicated: Secondary | ICD-10-CM

## 2015-06-10 DIAGNOSIS — J449 Chronic obstructive pulmonary disease, unspecified: Secondary | ICD-10-CM | POA: Diagnosis not present

## 2015-06-10 DIAGNOSIS — Z2239 Carrier of other specified bacterial diseases: Secondary | ICD-10-CM | POA: Diagnosis not present

## 2015-06-10 MED ORDER — FLUTICASONE-SALMETEROL 250-50 MCG/DOSE IN AEPB: 1.0000 | INHALATION_SPRAY | Freq: Two times a day (BID) | RESPIRATORY_TRACT | Status: DC

## 2015-06-10 NOTE — Progress Notes (Signed)
Subjective:     Patient ID: [name], female   DOB: 09-Nov-1930, 82 y.o.   MRN: [id number]  HPI ~  December 28, 2014:  Initial pulmonary consult by SN>   65 y/o WF, referred by Dr. Mertha Finders, for evaluation of dyspnea>>       [name] tells me that she was diagnosed w/ bronchiectasis ~15 yrs ago- notes that she had whooping cough as a child (but no known sequelae in childhood) and pneumonia x3 as an adult (in the 1990s she says);  She is not sure of her early course w/ her lung dis & records in Epic show old films from [place] office back to 2003 w/ similar features to current CXRs=> biapical pleuroparenchymal scarring with a +/- stable pattern;  CT Angiogram done 02/2006 showed neg for PE, biapical pleuroparenchymal scarring w/ fibronodular changes in RUL, RML, Lingula felt to represent likely post infectious scarring and NAD;  CXRs done 11/10/14 and 02/20/14 showed sl hyperinflation, flattening of diaphragms, stable biapical & upper lobe scarring, normal heart size, NAD...       Symptomatically she notes SOB/DOE- breathing harder when walking fast or exerting, going on for about 90yr & not really progressive (fairly stable dyspnea), ADLs are OK, prev walked on treadmill for 39min about 20yr ago but now not going to the gym;  She notes some chest tightness, a choking sensation in her throat, hard to get the air "IN" and a yawn seems to help temporarily;  She notes mild cough, sm amt green=>beige sput, w/o hemoptysis/ f/c/s... She is an ex-smoker having started in her teens, smoked off & on, up to 1/2 ppd, and quit in the 1980s- calc about a 15 pack-year smoking hx...       She is followed for CARDS by DrVaranasi> Hx CP & palpit w/ neg stress test and neg cath 2010; ?lifeline screen showed Afib but never confirmed (also showed mild carotid dis); notes SOB, choking, palpit, & Holter in 2014 showed PACs & given Lorazepam by PCP w/ some relief; DrV checks her yearly- on ASA81 and Metop25Bid...       Current  meds> Metop25Bid, Lorazepam0.5 prn, VitD supplement... EXAM reveals Afeb, VSS, O2sat=97% on RA;  HEENT- neg, mallampati1;  Chest- decr BS bilat, clear w/o w/r/r;  Heart- RR, gr1/6 SEM w/o r/g;  Abd- soft, neg;  Ext- neg w/o c/c/e...  Old CXRs in PACS> 2003/2007 showed biapical pleuroparenchymal scarring with a +/- stable pattern.   CT Angiogram done 02/2006 showed neg for PE, biapical pleuroparenchymal scarring w/ fibronodular changes in RUL, RML, lingula felt to represent likely post infectious scarring and NAD.   CXRs done 11/10/14 and 02/20/14 showed sl hyperinflation, flattening of diaphragms, stable biapical & upper lobe scarring, normal heart size, NAD (osteopenia & mid Tspine compression)...   PFT 11/20/14 showed FVC=1.80 (78%), FEV1=0.90 (53%), %1sec=50, mid-flows reduced at 31% predicted; post bronchodil there was a 19% improvement in FEV1; LungVols- TLC=4.66 (98%), RV=2.73 (117%), RV/TLC=59%; DLCO is reduced at 43% predicted...   LABS 12/28/14:  We checked BMet- wnl;  CBC- wnl w/ 2% eos;  Sed=16;  Quantiferon GOLD= NEG...  Hi-res CT Chest => sched for 01/05/15 & pending     IMP/PLAN>>  18 y/o woman w/ mild smoking hx & quit 30 yrs ago; she has mod obstructive lung dis on PFT w/ reversible component AND decr DLCO; Labs appear neg & QuantGold is neg as well- still could have underlying bronchiectasis & poss atypic mycobactium, awaiting Hi-res CT  Chest, consider getting sput for cult/ AFB; In any event her parenchymal dis does not appear to be very progressive;  REC- trial SYMBICORT160- 2sp Bid and we will recheck pt in 4-6 weeks...   ADDENDUM>>  CT Chest 01/05/15 showed norm heart size, atherosclerosis, sl dilated PA trunk at 3.5cm suggestion PAH, no adenopathy;  Lungs show bilat patchy bronchiectasis & interstitial thickening w/ peribronchvasc nodularity (sl worse than 2007) and no ground glass, reticulation, honeycombing => we will check sput specimen for cult & AFB... ADDENDUM>>  Sputum C&S  01/15/15 was pos for Pseudomonas (resist to Rocephin, sens to Cipro etc)- likely colonizer;  AFB smears are neg & culture pending...   ~  February 08, 2015:  6wk ROV w/ SN>      [name] reports that her breathing is improved, less SOB, notes that she walked a lot over the weekend at the [place] did well;  She denies much cough, sput, no hemoptysis, no CP, no f/c/s... She remains on Advair115-2spBid and Lorazepam 0.5mg  prn...     Mod obstructive lung dis w/ a revers component & GOLD Stage3 COPD on PFTs> on ADVAIR115-2spBid & she is improved w/ incr exercise tolerance    Bronchiectasis & pleuroparenchymal scarring on CT, r/o MAI infection/ [place] syndrome> AFB cult is neg x1    Pseudomonas colonization> the pseudomonas is sens to Cipro... EXAM reveals Afeb, VSS, O2sat=97% on RA;  HEENT- neg, mallampati1;  Chest- decr BS bilat, clear w/o w/r/r;  Heart- RR, gr1/6 SEM w/o r/g;  Abd- soft, neg;  Ext- neg w/o c/c/e... IMP/PLAN>>  Concepsion is stable=> improved, asked to continue the Advair115-2spBid; she has already had the 2016 Flu vaccine; we plan ROV in 3-4 moths, sooner if needed...   ~  June 10, 2015:  67mo ROV & [name] remains stable- breathing is good, no prob w/ winter weather so far, she remains active & denies cough, sput, hemoptysis; she has chr stable DOE & trying to exercise; she wonders if the Advair250-DPI inhaler would be better for her & we gave her a sample 7 new Rx so she can decide...      Mod obstructive lung dis w/ a revers component & GOLD Stage3 COPD on PFTs> on ADVAIR115-2spBid & she is improved w/ incr exercise tolerance    Bronchiectasis & pleuroparenchymal scarring on CT, r/o MAI infection/ [place] syndrome> AFB cult is neg x1    Pseudomonas colonization> the pseudomonas is sens to Cipro... EXAM reveals Afeb, VSS, O2sat=96% on RA;  HEENT- neg, mallampati1;  Chest- decr BS bilat, clear w/o w/r/r;  Heart- RR, gr1/6 SEM w/o r/g;  Abd- soft, neg;  Ext- neg w/o  c/c/e... IMP/PLAN>>  [name] remains stable-- she will decide if she prefers the MDI vs the DPI inhaletr; asked to incr exercise program, call for any problems and we plan routine f/u 71mo...    Past Medical History  Diagnosis Date  . DJD (degenerative joint disease) of knee   . Bronchiectasis ([place])     with multiple pneumonia back in 1990's  . Bronchitis     episodic  . PVC's (premature ventricular contractions)     on EKG, controlled on atenolol and caffeine reduction  . History of nuclear stress test 11/07    NORMAL  . GERD (gastroesophageal reflux disease)     episodic symptoms  . Hearing loss     chronic moderate hearing loss, worked up by ENT and hearing aids have been recommended  . Tinnitus  chronic  . Insomnia     Past Surgical History  Procedure Laterality Date  . Cyst removed      left breast    Outpatient Encounter Prescriptions as of 06/10/2015  Medication Sig  . Cholecalciferol (VITAMIN D) 2000 UNITS CAPS Take 2,000 Units by mouth daily.  . fluticasone-salmeterol (ADVAIR HFA) 115-21 MCG/ACT inhaler Inhale 2 puffs into the lungs 2 (two) times daily.  Marland Kitchen LORazepam (ATIVAN) 0.5 MG tablet Take 0.25 mg by mouth 2 (two) times daily as needed for anxiety.   . metoprolol tartrate (LOPRESSOR) 25 MG tablet TAKE 1 TABLET BY MOUTH TWICE DAILY    Allergies  Allergen Reactions  . Augmentin [Amoxicillin-Pot Clavulanate] Nausea And Vomiting  . Biaxin [Clarithromycin] Nausea And Vomiting  . Tequin [Gatifloxacin] Nausea Only    Current Medications, Allergies, Past Medical History, Past Surgical History, Family History, and Social History were reviewed in [employer] record.   Review of Systems            All symptoms NEG except where BOLDED >>  Constitutional:  F/C/S, fatigue, anorexia, unexpected weight change. HEENT:  HA, visual changes, hearing loss, earache, nasal symptoms, sore throat, mouth sores, hoarseness. Resp:  cough, sputum,  hemoptysis; SOB, tightness, wheezing. Cardio:  CP, palpit, DOE, orthopnea, edema. GI:  N/V/D/C, blood in stool; reflux, abd pain, distention, gas. GU:  dysuria, freq, urgency, hematuria, flank pain, voiding difficulty. MS:  joint pain, swelling, tenderness, decr ROM; neck pain, back pain, etc. Neuro:  HA, tremors, seizures, dizziness, syncope, weakness, numbness, gait abn. Skin:  suspicious lesions or skin rash. Heme:  adenopathy, bruising, bleeding. Psyche:  confusion, agitation, sleep disturbance, hallucinations, anxiety, depression suicidal.   Objective:   Physical Exam      Vital Signs:  Reviewed...  General:  WD, WN, 82 y/o WM in NAD; alert & oriented; pleasant & cooperative... HEENT:  /AT; Conjunctiva- pink, Sclera- nonicteric, EOM-wnl, PERRLA, EACs-clear, TMs-wnl; NOSE-clear; THROAT-clear & wnl. Neck:  Supple w/ fairl ROM; no JVD; normal carotid impulses w/o bruits; no thyromegaly or nodules palpated; no lymphadenopathy. Chest:  Sl decr BS at bases, clear to P & A x few rhonchi; without wheezes, rales, or signs of consolidation... Heart:  Regular Rhythm; norm S1 & S2 without murmurs, rubs, or gallops detected. Abdomen:  Soft & nontender- no guarding or rebound; normal bowel sounds; no organomegaly or masses palpated. Ext:  decrROM; without deformities +arthritic changes; no varicose veins, venous insuffic, or edema;  Pulses intact w/o bruits. Neuro:  No focal neuro deficits; sensory testing normal; gait normal & balance OK. Derm:  No lesions noted; no rash etc. Lymph:  No cervical, supraclavicular, axillary, or inguinal adenopathy palpated.   Assessment:      IMP >>     Mod obstructive lung dis w/ a revers component & GOLD Stage3 COPD on PFTs> on [id number] & she is improved w/ incr exercise tolerance; wants to try DPI inhaler- ok...    Bronchiectasis & pleuroparenchymal scarring on CT, r/o MAI infection/ [place] syndrome> AFB cult is neg x1    Pseudomonas  colonization> the pseudomonas is sens to Cipro...  PLAN >> 8/1>   76 y/o woman w/ mild smoking hx & quit 30 yrs ago; she has mod obstructive lung dis on PFT w/ reversible component AND decr DLCO; Labs appear neg & QuantGold is neg as well- still could have underlying bronchiectasis & poss atypic mycobactium, awaiting Hi-res CT Chest, consider getting sput for cult/ AFB; In any event her  parenchymal dis does not appear to be very progressive;  REC- trial SYMBICORT160- 2sp Bid and we will recheck pt in 4-6 weeks. 9/12>   [place]letha is stable=> improved, asked to continue the [place]; she has already had the 2016 Flu vaccine; we plan ROV in 3-4 moths, sooner if needed... 06/11/15>  [place]a remains stable-- she will decide if she prefers the MDI vs the DPI inhaletr; asked to incr exercise program, call for any problems and we plan routine f/u 67mo.     Plan:     Patient's Medications  New Prescriptions   FLUTICASONE-SALMETEROL (ADVAIR) 250-50 MCG/DOSE AEPB    Inhale 1 puff into the lungs every 12 (twelve) hours.  Previous Medications   CHOLECALCIFEROL (VITAMIN D) 2000 UNITS CAPS    Take 2,000 Units by mouth daily.   FLUTICASONE-SALMETEROL (ADVAIR HFA) 115-21 MCG/ACT INHALER    Inhale 2 puffs into the lungs 2 (two) times daily.   LORAZEPAM (ATIVAN) 0.5 MG TABLET    Take 0.25 mg by mouth 2 (two) times daily as needed for anxiety.    METOPROLOL TARTRATE (LOPRESSOR) 25 MG TABLET    TAKE 1 TABLET BY MOUTH TWICE DAILY  Modified Medications   No medications on file  Discontinued Medications   No medications on file

## 2015-06-10 NOTE — Patient Instructions (Signed)
Today we updated your med list in our EPIC system...    Continue your current medications the same...  We will give you a try on the ADVAIR250 inhaler- one inhalation twice daily...    You can choose betw the Advair-HFA 115 inhaler (aerosol) & the Advair 250 DPI inhaler...  Stay as active as possible...  Call for any questions...  Let's plan a follow up visit in 19mo, sooner if needed for breathing problems.Marland KitchenMarland Kitchen

## 2015-06-21 ENCOUNTER — Telehealth: Payer: Self-pay | Admitting: Pulmonary Disease

## 2015-06-21 NOTE — Telephone Encounter (Signed)
Called spoke with pt. She had prevnar 13 09/2013. I have placed this in her immunization history. Nothing further needed

## 2015-06-24 DIAGNOSIS — S39012A Strain of muscle, fascia and tendon of lower back, initial encounter: Secondary | ICD-10-CM | POA: Diagnosis not present

## 2015-07-02 ENCOUNTER — Inpatient Hospital Stay (HOSPITAL_BASED_OUTPATIENT_CLINIC_OR_DEPARTMENT_OTHER)
Admission: EM | Admit: 2015-07-02 | Discharge: 2015-07-03 | DRG: 310 | Disposition: A | Discharge status: Home / Self Care | Payer: Medicare Other | Attending: Cardiovascular Disease | Admitting: Cardiovascular Disease

## 2015-07-02 ENCOUNTER — Emergency Department (HOSPITAL_BASED_OUTPATIENT_CLINIC_OR_DEPARTMENT_OTHER): Payer: Medicare Other

## 2015-07-02 ENCOUNTER — Encounter (HOSPITAL_BASED_OUTPATIENT_CLINIC_OR_DEPARTMENT_OTHER): Payer: Self-pay | Admitting: *Deleted

## 2015-07-02 DIAGNOSIS — Z87891 Personal history of nicotine dependence: Secondary | ICD-10-CM

## 2015-07-02 DIAGNOSIS — Z881 Allergy status to other antibiotic agents status: Secondary | ICD-10-CM | POA: Diagnosis not present

## 2015-07-02 DIAGNOSIS — Z8249 Family history of ischemic heart disease and other diseases of the circulatory system: Secondary | ICD-10-CM

## 2015-07-02 DIAGNOSIS — J439 Emphysema, unspecified: Secondary | ICD-10-CM | POA: Diagnosis present

## 2015-07-02 DIAGNOSIS — I480 Paroxysmal atrial fibrillation: Secondary | ICD-10-CM | POA: Diagnosis not present

## 2015-07-02 DIAGNOSIS — Z5181 Encounter for therapeutic drug level monitoring: Secondary | ICD-10-CM | POA: Diagnosis not present

## 2015-07-02 DIAGNOSIS — Z7901 Long term (current) use of anticoagulants: Secondary | ICD-10-CM

## 2015-07-02 DIAGNOSIS — R002 Palpitations: Secondary | ICD-10-CM | POA: Diagnosis present

## 2015-07-02 DIAGNOSIS — J479 Bronchiectasis, uncomplicated: Secondary | ICD-10-CM | POA: Diagnosis not present

## 2015-07-02 DIAGNOSIS — I4891 Unspecified atrial fibrillation: Secondary | ICD-10-CM | POA: Diagnosis not present

## 2015-07-02 DIAGNOSIS — H9193 Unspecified hearing loss, bilateral: Secondary | ICD-10-CM | POA: Diagnosis not present

## 2015-07-02 DIAGNOSIS — Z7951 Long term (current) use of inhaled steroids: Secondary | ICD-10-CM | POA: Diagnosis not present

## 2015-07-02 LAB — BASIC METABOLIC PANEL
Anion gap: 10 (ref 5–15)
BUN: 16 mg/dL (ref 6–20)
CHLORIDE: 105 mmol/L (ref 101–111)
CO2: 29 mmol/L (ref 22–32)
Calcium: 9.4 mg/dL (ref 8.9–10.3)
Creatinine, Ser: 0.67 mg/dL (ref 0.44–1.00)
GLUCOSE: 110 mg/dL — AB (ref 65–99)
POTASSIUM: 3.7 mmol/L (ref 3.5–5.1)
Sodium: 144 mmol/L (ref 135–145)

## 2015-07-02 LAB — TROPONIN I
Troponin I: <0.03 ng/mL (ref <0.031)
Troponin I: 0.04 ng/mL — ABNORMAL HIGH (ref <0.031)
Troponin I: <0.03 ng/mL (ref <0.031)

## 2015-07-02 LAB — CBC
HCT: 47.7 % — ABNORMAL HIGH (ref 36.0–46.0)
HEMOGLOBIN: 15.6 g/dL — AB (ref 12.0–15.0)
MCH: 29.1 pg (ref 26.0–34.0)
MCHC: 32.7 g/dL (ref 30.0–36.0)
MCV: 89 fL (ref 78.0–100.0)
PLATELETS: 154 10*3/uL (ref 150–400)
RBC: 5.36 MIL/uL — AB (ref 3.87–5.11)
RDW: 13.5 % (ref 11.5–15.5)
WBC: 5.5 10*3/uL (ref 4.0–10.5)

## 2015-07-02 LAB — TSH: TSH: 2.443 u[IU]/mL (ref 0.350–4.500)

## 2015-07-02 MED ORDER — DILTIAZEM HCL 100 MG IV SOLR
5.0000 mg/h | INTRAVENOUS | Status: DC
  Administered 2015-07-02: 5 mg/h via INTRAVENOUS
  Filled 2015-07-02: qty 100

## 2015-07-02 MED ORDER — APIXABAN 2.5 MG PO TABS
2.5000 mg | ORAL_TABLET | Freq: Two times a day (BID) | ORAL | Status: DC
  Administered 2015-07-03: 2.5 mg via ORAL
  Filled 2015-07-02: qty 1

## 2015-07-02 MED ORDER — ONDANSETRON HCL 4 MG/2ML IJ SOLN: 4.0000 mg | Freq: Four times a day (QID) | INTRAMUSCULAR | Status: DC | PRN

## 2015-07-02 MED ORDER — ACETAMINOPHEN 325 MG PO TABS: 650.0000 mg | ORAL_TABLET | ORAL | Status: DC | PRN

## 2015-07-02 MED ORDER — APIXABAN 2.5 MG PO TABS
2.5000 mg | ORAL_TABLET | Freq: Two times a day (BID) | ORAL | Status: DC
  Administered 2015-07-02: 2.5 mg via ORAL
  Filled 2015-07-02: qty 1

## 2015-07-02 MED ORDER — ASPIRIN 81 MG PO CHEW
CHEWABLE_TABLET | ORAL | Status: AC
  Filled 2015-07-02: qty 4

## 2015-07-02 MED ORDER — DILTIAZEM LOAD VIA INFUSION
10.0000 mg | Freq: Once | INTRAVENOUS | Status: AC
  Administered 2015-07-02: 10 mg via INTRAVENOUS
  Filled 2015-07-02: qty 10

## 2015-07-02 MED ORDER — VITAMIN D 1000 UNITS PO TABS
2000.0000 [IU] | ORAL_TABLET | Freq: Every day | ORAL | Status: DC
  Administered 2015-07-02 – 2015-07-03 (×2): 2000 [IU] via ORAL
  Filled 2015-07-02 (×4): qty 2

## 2015-07-02 MED ORDER — METOPROLOL TARTRATE 50 MG PO TABS
50.0000 mg | ORAL_TABLET | Freq: Two times a day (BID) | ORAL | Status: DC
  Administered 2015-07-02: 50 mg via ORAL
  Filled 2015-07-02: qty 1

## 2015-07-02 MED ORDER — METOPROLOL TARTRATE 50 MG PO TABS
50.0000 mg | ORAL_TABLET | Freq: Two times a day (BID) | ORAL | Status: DC
  Administered 2015-07-03: 50 mg via ORAL
  Filled 2015-07-02: qty 1

## 2015-07-02 MED ORDER — LORAZEPAM 0.5 MG PO TABS: 0.2500 mg | ORAL_TABLET | Freq: Two times a day (BID) | ORAL | Status: DC | PRN

## 2015-07-02 NOTE — ED Notes (Signed)
Irregular heart beat since last night.  Denies pain.  Reports a 'funny feeling' in her left chest.  Denies radiation of sensation.  Denies SOB, N/V.

## 2015-07-02 NOTE — ED Provider Notes (Signed)
CSN: [id number]     Arrival date & time 07/02/15  0821 History   First MD Initiated Contact with Patient 07/02/15 [phone]     Chief Complaint  Patient presents with  . Irregular Heart Beat     (Consider location/radiation/quality/duration/timing/severity/associated sxs/prior Treatment) HPI  Pt presenting with c/o palpitations and heart racing.  She also describes some chest pressure.  Symptoms started last night.  No fainting or dizziness.  No difficulty breathing associated.  She states she sees cardiology for hx of irregular heart beats but has not been diagnosed with atrial fibrillation.  No recent illness.  No fever, vomiting diarrhea.  There are no other associated systemic symptoms, there are no other alleviating or modifying factors.   Past Medical History  Diagnosis Date  . DJD (degenerative joint disease) of knee   . Bronchiectasis ([place])     with multiple pneumonia back in 1990's  . Bronchitis     episodic  . PVC's (premature ventricular contractions)     on EKG, controlled on atenolol and caffeine reduction  . History of nuclear stress test 11/07    NORMAL  . GERD (gastroesophageal reflux disease)     episodic symptoms  . Hearing loss     chronic moderate hearing loss, worked up by ENT and hearing aids have been recommended  . Tinnitus     chronic  . Insomnia    Past Surgical History  Procedure Laterality Date  . Cyst removed      left breast   Family History  Problem Relation Age of Onset  . Stroke Father   . Hypertension Father   . Stroke Mother   . Hypertension Mother   . Asthma Sister   . Clotting disorder Sister    Social History  Substance Use Topics  . Smoking status: Former Smoker -- 0.50 packs/day for 20 years    Types: Cigarettes    Quit date: 05/29/1988  . Smokeless tobacco: Never Used  . Alcohol Use: 0.0 oz/week    0 Standard drinks or equivalent per week     Comment: 1 can of beer at night   OB History    No data available     Review of  Systems  ROS reviewed and all otherwise negative except for mentioned in HPI    Allergies  Augmentin; Biaxin; and Tequin  Home Medications   Prior to Admission medications   Medication Sig Start Date End Date Taking? Authorizing Provider  Cholecalciferol (VITAMIN D) 2000 UNITS CAPS Take 2,000 Units by mouth daily.    Historical Provider, MD  fluticasone-salmeterol (ADVAIR HFA) 115-21 MCG/ACT inhaler Inhale 2 puffs into the lungs 2 (two) times daily. 12/30/14   Noralee Space, MD  Fluticasone-Salmeterol (ADVAIR) 250-50 MCG/DOSE AEPB Inhale 1 puff into the lungs every 12 (twelve) hours. 06/10/15   Noralee Space, MD  LORazepam (ATIVAN) 0.5 MG tablet Take 0.25 mg by mouth 2 (two) times daily as needed for anxiety.  11/11/14   Historical Provider, MD  metoprolol tartrate (LOPRESSOR) 25 MG tablet TAKE 1 TABLET BY MOUTH TWICE DAILY 10/12/14   Jettie Booze, MD   BP 119/72 mmHg  Pulse 99  Temp(Src) 98.2 F (36.8 C) (Oral)  Resp 19  Ht 5\' 2"  (1.575 m)  Wt 127 lb (57.607 kg)  BMI 23.22 kg/m2  SpO2 97%  Vitals reviewed Physical Exam  Physical Examination: General appearance - alert, well appearing, and in no distress Mental status - alert, oriented to person, place, and  time Eyes - no conjunctival injection no scleral icterus Mouth - mucous membranes moist, pharynx normal without lesions Chest - clear to auscultation, no wheezes, rales or rhonchi, symmetric air entry Heart - normal rate, regular rhythm, normal S1, S2, no murmurs, rubs, clicks or gallops Abdomen - soft, nontender, nondistended, no masses or organomegaly Neurological - alert, oriented, normal speech Extremities - peripheral pulses normal, no pedal edema, no clubbing or cyanosis Skin - normal coloration and turgor, no rashes  ED Course  Procedures (including critical care time)  CRITICAL CARE Performed by: Threasa Beards Total critical care time: 40 minutes Critical care time was exclusive of separately billable  procedures and treating other patients. Critical care was necessary to treat or prevent imminent or life-threatening deterioration. Critical care was time spent personally by me on the following activities: development of treatment plan with patient and/or surrogate as well as nursing, discussions with consultants, evaluation of patient's response to treatment, examination of patient, obtaining history from patient or surrogate, ordering and performing treatments and interventions, ordering and review of laboratory studies, ordering and review of radiographic studies, pulse oximetry and re-evaluation of patient's condition. Labs Review Labs Reviewed  CBC - Abnormal; Notable for the following:    RBC 5.36 (*)    Hemoglobin 15.6 (*)    HCT 47.7 (*)    All other components within normal limits  BASIC METABOLIC PANEL - Abnormal; Notable for the following:    Glucose, Bld 110 (*)    All other components within normal limits  TROPONIN I  TSH    Imaging Review Dg Chest 2 View  07/02/2015  CLINICAL DATA:  A irregular heartbeat.  Palpitations. EXAM: CHEST - 2 VIEW COMPARISON:  CT of the chest 01/05/2015. FINDINGS: The heart size is normal. There is no edema or effusion. Emphysematous changes are again noted. 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 There is no significant progression. No superimposed airspace disease is evident. The visualized soft tissues and bony thorax are unremarkable. IMPRESSION: 1. No acute cardiopulmonary disease or significant interval change. 2. Stable chronic changes in the right middle lobe and upper lobes bilaterally as described. 3. Emphysema. Electronically Signed   By: San Morelle M.D.   On: 07/02/2015 09:22   I have personally reviewed and evaluated these images and lab results as part of my medical decision-making.   EKG  Interpretation   Date/Time:  Friday July 02 2015 08:36:45 EST Ventricular Rate:  130 PR Interval:    QRS Duration: 72 QT Interval:  302 QTC Calculation: 444 R Axis:     Text Interpretation:  Atrial fibrillation with rapid ventricular response  Probable anteroseptal infarct, old ST depression, probably rate related No  old tracing to compare Confirmed by [facility]  MD, [place] [phone]) on 07/02/2015  9:07:49 AM      MDM   Final diagnoses:  Atrial fibrillation with rapid ventricular response ([place])    Pt presenting with c/o palpitations.  On workup in the ED she is in afib with RVR.  She has hx of irregular heart beat per chart review with PACs, but no hx of afib.  Pt started on diltiazem drip.  Labs are reassuring.  D/w cardiolgy for admission.  Pt is agreeable with plan for transfer.    9:44 AM d/w Dr. Claiborne Billings, pt accepted for admission to telemetry bed.    Alfonzo Beers, MD 07/02/15 [phone]

## 2015-07-02 NOTE — Progress Notes (Signed)
Converted to Sinus Brady low 40's, C/O brief episode of dizziness which has passed per pt. BP= 108/57.

## 2015-07-02 NOTE — H&P (Signed)
Patient ID: [name] MRN: [id number], DOB/AGE: 08-10-1930   Admit date: 07/02/2015   Primary Physician: Henrine Screws, MD Primary Cardiologist: Dr. Irish Lack  Pt. Profile:  The patient is a pleasant 82 year old Caucasian female with past medical history of bronchiectasis with multiple pneumonia, chronic moderate hearing loss and chronic tinnitus presented with new atrial fibrillation  Problem List  Past Medical History  Diagnosis Date  . DJD (degenerative joint disease) of knee   . Bronchiectasis ([place])     with multiple pneumonia back in 1990's  . Bronchitis     episodic  . PVC's (premature ventricular contractions)     on EKG, controlled on atenolol and caffeine reduction  . History of nuclear stress test 11/07    NORMAL  . GERD (gastroesophageal reflux disease)     episodic symptoms  . Hearing loss     chronic moderate hearing loss, worked up by ENT and hearing aids have been recommended  . Tinnitus     chronic  . Insomnia     Past Surgical History  Procedure Laterality Date  . Cyst removed      left breast     Allergies  Allergies  Allergen Reactions  . Augmentin [Amoxicillin-Pot Clavulanate] Nausea And Vomiting  . Biaxin [Clarithromycin] Nausea And Vomiting  . Tequin [Gatifloxacin] Nausea Only    HPI  The patient is a pleasant 82 year old Caucasian female with past medical history of bronchiectasis with multiple pneumonia, chronic moderate hearing loss and chronic tinnitus.  she had a clean cath in 2010. She had a [employer] screening that reported atrial fibrillation, however upon further review affect her cardiologist, it was sinus rhythm with PAC in 2014. She has been on metoprolol 25 mg twice a day at home and has been compliant with her medication.   Last night around 8 PM, she had acute onset of palpitation. She denies any chest discomfort, dizziness, or shortness of breath. She denies recent sign of infection. She did take her nighttime  metoprolol dose, however did not take any extra dose. Around 4 AM this morning, since the her symptom has persisted, she decided to seek medical attention at [facility] and noticed she was in atrial fibrillation with RVR. She was placed on IV diltiazem and subsequently transferred to [facility] for further evaluation. She does drink one beer per night, however TSH was negative.    Home Medications  Prior to Admission medications   Medication Sig Start Date End Date Taking? Authorizing Provider  Cholecalciferol (VITAMIN D) 2000 UNITS CAPS Take 2,000 Units by mouth daily.   Yes Historical Provider, MD  fluticasone-salmeterol (ADVAIR HFA) 115-21 MCG/ACT inhaler Inhale 2 puffs into the lungs 2 (two) times daily. 12/30/14  Yes Noralee Space, MD  LORazepam (ATIVAN) 0.5 MG tablet Take 0.25 mg by mouth 2 (two) times daily as needed for anxiety.  11/11/14  Yes Historical Provider, MD  metoprolol tartrate (LOPRESSOR) 25 MG tablet TAKE 1 TABLET BY MOUTH TWICE DAILY Patient taking differently: TAKE 1 TABLET BY MOUTH TWICE DAILY WITH MEALS 10/12/14  Yes Jettie Booze, MD  Polyethyl Glycol-Propyl Glycol (SYSTANE OP) Place 1 drop into both eyes daily as needed (dry eyes).   Yes Historical Provider, MD  Fluticasone-Salmeterol (ADVAIR) 250-50 MCG/DOSE AEPB Inhale 1 puff into the lungs every 12 (twelve) hours. 06/10/15   Noralee Space, MD    Family History  Family History  Problem Relation Age of Onset  . Stroke Father   .  Hypertension Father   . Stroke Mother   . Hypertension Mother   . Asthma Sister   . Clotting disorder Sister     Social History  Social History   Social History  . Marital Status: Divorced    Spouse Name: N/A  . Number of Children: N/A  . Years of Education: N/A   Occupational History  . retired    Social History Main Topics  . Smoking status: Former Smoker -- 0.50 packs/day for 20 years    Types: Cigarettes    Quit date: 05/29/1988  . Smokeless  tobacco: Never Used  . Alcohol Use: 0.0 oz/week    0 Standard drinks or equivalent per week     Comment: 1 can of beer at night  . Drug Use: No  . Sexual Activity: Not on file   Other Topics Concern  . Not on file   Social History Narrative     Review of Systems General:  No chills, fever, night sweats or weight changes.  Cardiovascular:  No chest pain, dyspnea on exertion, edema, orthopnea, paroxysmal nocturnal dyspnea. +palpitation Dermatological: No rash, lesions/masses Respiratory: No cough, dyspnea Urologic: No hematuria, dysuria Abdominal:   No nausea, vomiting, diarrhea, bright red blood per rectum, melena, or hematemesis Neurologic:  No visual changes, wkns, changes in mental status. All other systems reviewed and are otherwise negative except as noted above.  Physical Exam  Blood pressure 141/67, pulse 99, temperature 97.9 F (36.6 C), temperature source Oral, resp. rate 21, height 5\' 2"  (1.575 m), weight 123 lb 8 oz (56.019 kg), SpO2 100 %.  General: Pleasant, NAD Psych: Normal affect. Neuro: Alert and oriented X 3. Moves all extremities spontaneously. HEENT: Normal  Neck: Supple without bruits or JVD. Lungs:  Resp regular and unlabored, CTA. Heart: RRR no s3, s4, or murmurs. Abdomen: Soft, non-tender, non-distended, BS + x 4.  Extremities: No clubbing, cyanosis or edema. DP/PT/Radials 2+ and equal bilaterally.  Labs  Troponin (Point of Care Test) No results for input(s): TROPIPOC in the last 72 hours.  Recent Labs  07/02/15 0830  TROPONINI <0.03   Lab Results  Component Value Date   WBC 5.5 07/02/2015   HGB 15.6* 07/02/2015   HCT 47.7* 07/02/2015   MCV 89.0 07/02/2015   PLT 154 07/02/2015    Recent Labs Lab 07/02/15 0830  NA 144  K 3.7  CL 105  CO2 29  BUN 16  CREATININE 0.67  CALCIUM 9.4  GLUCOSE 110*   No results found for: CHOL, HDL, LDLCALC, TRIG No results found for: DDIMER   Radiology/Studies  Dg Chest 2 View  07/02/2015   CLINICAL DATA:  A irregular heartbeat.  Palpitations. EXAM: CHEST - 2 VIEW COMPARISON:  CT of the chest 01/05/2015. FINDINGS: The heart size is normal. There is no edema or effusion. Emphysematous changes are again noted. 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 There is no significant progression. No superimposed airspace disease is evident. The visualized soft tissues and bony thorax are unremarkable. IMPRESSION: 1. No acute cardiopulmonary disease or significant interval change. 2. Stable chronic changes in the right middle lobe and upper lobes bilaterally as described. 3. Emphysema. Electronically Signed   By: San Morelle M.D.   On: 07/02/2015 09:22    ECG  A-fib with RVR  Echocardiogram  pending    ASSESSMENT AND PLAN  1. Newly diagnosed atrial fibrillation   - CHA2DS2-Vasc  score 3 (female, age)  - increase metoprolol to 50mg  BID, titrate down diltiazem as needed. Add eliquis 2.5mg  BID. If rate controlled, potentially discharge tomorrow. If still in afib in 3 weeks, then outpatient D[facility]  - obtain echo   Hilbert Corrigan, PA-C 07/02/2015, 4:42 PM   Patient seen and examined. Agree with assessment and plan.  Ms. Aidyn Gunsallus is a very pleasant 82 year old female who is followed by Dr. Irish Lack. She has a history of bronchiectasis, as well as chronic tinnitus and moderate hearing loss.  She underwent coronary angiography in 2010 which revealed normal coronary arteries.  She denies any awareness of prior rhythm disturbance.  Yesterday she developed the acute onset of a fast heartbeat.  She had been on chronic low-dose metoprolol therapy and did take her normal dose, but did not take an extra pill.  Due to persistence of this tachycardia.  She ultimately presented to [facility] today and was found to be in atrial fibrillation with rapid ventricular  response.  She has been treated with IV L diltiazem, which slowing of her heart rate from 130 bpm to presently in the 90s.  She denies any associated chest pressure.  She denies any significant caffeine intake.  She does drink an occasional beer.  She denies any recent pseudoephedrine preparations.  Physical examination reveals a normal blood pressure.  Her pulse is regular regular in the 90s.  She is afebrile.  HNT is unremarkable.  There is no JVD.  Her lungs were clear.  Rhythm was irregularly irregular in the 90s with a faint 1/6 systolic murmur.  Abdomen was soft and nontender.  Abdominal aorta was not enlarged by palpation.  She did not have chest wall tenderness.  There was no clubbing, cyanosis or edema.  Neurologic exam was grossly nonfocal.  Admission laboratories notable for potassium of 3.7.  Normal renal function.  Troponin is negative.  She is not anemic.  TSH is normal at 2.4.  Magnesium is not done and this will be checked.  Chest x-ray did not show any acute cardiopulmonary disease with chronic changes in the right middle lobe and upper lobes bilaterally with some emphysematous changes.  With the patient's age of greater than 10, weight of less than 60 kg, anticoagulation will be implemented with low-dose eliquis at 2.5 mg twice a day.  Her beta blocker dose will be increased.  She presently is on Cardizem drip and this will be weaned as tolerated.  An echo Doppler study will be scheduled. If she does not immediately convert, will ultimately undergo cardioversion after approximately 4 weeks of anticoagulation treatment.  Troy Sine, MD, [facility] 07/02/2015 5:52 PM

## 2015-07-03 ENCOUNTER — Observation Stay (HOSPITAL_COMMUNITY): Payer: Medicare Other

## 2015-07-03 DIAGNOSIS — R002 Palpitations: Secondary | ICD-10-CM | POA: Diagnosis not present

## 2015-07-03 DIAGNOSIS — J439 Emphysema, unspecified: Secondary | ICD-10-CM | POA: Diagnosis not present

## 2015-07-03 DIAGNOSIS — I480 Paroxysmal atrial fibrillation: Secondary | ICD-10-CM | POA: Diagnosis not present

## 2015-07-03 DIAGNOSIS — I4891 Unspecified atrial fibrillation: Secondary | ICD-10-CM | POA: Diagnosis not present

## 2015-07-03 DIAGNOSIS — H9193 Unspecified hearing loss, bilateral: Secondary | ICD-10-CM | POA: Diagnosis not present

## 2015-07-03 DIAGNOSIS — Z87891 Personal history of nicotine dependence: Secondary | ICD-10-CM | POA: Diagnosis not present

## 2015-07-03 DIAGNOSIS — Z8249 Family history of ischemic heart disease and other diseases of the circulatory system: Secondary | ICD-10-CM | POA: Diagnosis not present

## 2015-07-03 DIAGNOSIS — Z7901 Long term (current) use of anticoagulants: Secondary | ICD-10-CM | POA: Diagnosis not present

## 2015-07-03 DIAGNOSIS — Z881 Allergy status to other antibiotic agents status: Secondary | ICD-10-CM | POA: Diagnosis not present

## 2015-07-03 LAB — HEMOGLOBIN A1C
HEMOGLOBIN A1C: 5.7 % — AB (ref 4.8–5.6)
MEAN PLASMA GLUCOSE: 117 mg/dL

## 2015-07-03 LAB — BASIC METABOLIC PANEL
ANION GAP: 12 (ref 5–15)
BUN: 12 mg/dL (ref 6–20)
CO2: 27 mmol/L (ref 22–32)
Calcium: 9.6 mg/dL (ref 8.9–10.3)
Chloride: 104 mmol/L (ref 101–111)
Creatinine, Ser: 0.7 mg/dL (ref 0.44–1.00)
Glucose, Bld: 109 mg/dL — ABNORMAL HIGH (ref 65–99)
POTASSIUM: 3.9 mmol/L (ref 3.5–5.1)
SODIUM: 143 mmol/L (ref 135–145)

## 2015-07-03 LAB — MAGNESIUM: MAGNESIUM: 2.1 mg/dL (ref 1.7–2.4)

## 2015-07-03 LAB — TROPONIN I

## 2015-07-03 LAB — LIPID PANEL
CHOL/HDL RATIO: 2.9 ratio
CHOLESTEROL: 163 mg/dL (ref 0–200)
HDL: 57 mg/dL (ref >40)
LDL Cholesterol: 91 mg/dL (ref 0–99)
TRIGLYCERIDES: 73 mg/dL (ref <150)
VLDL: 15 mg/dL (ref 0–40)

## 2015-07-03 LAB — CBC
HCT: 43.6 % (ref 36.0–46.0)
HEMOGLOBIN: 14.4 g/dL (ref 12.0–15.0)
MCH: 29.4 pg (ref 26.0–34.0)
MCHC: 33 g/dL (ref 30.0–36.0)
MCV: 89.2 fL (ref 78.0–100.0)
Platelets: 161 10*3/uL (ref 150–400)
RBC: 4.89 MIL/uL (ref 3.87–5.11)
RDW: 13.8 % (ref 11.5–15.5)
WBC: 4.9 10*3/uL (ref 4.0–10.5)

## 2015-07-03 MED ORDER — METOPROLOL TARTRATE 50 MG PO TABS: 50.0000 mg | ORAL_TABLET | Freq: Two times a day (BID) | ORAL | Status: DC

## 2015-07-03 MED ORDER — APIXABAN 2.5 MG PO TABS: 2.5000 mg | ORAL_TABLET | Freq: Two times a day (BID) | ORAL | Status: DC

## 2015-07-03 NOTE — Progress Notes (Signed)
Subjective:  Feels better; converted to NSR last night  Objective:   Vital Signs : Filed Vitals:   07/03/15 0508 07/03/15 0526 07/03/15 0708 07/03/15 0731  BP: 111/63  101/40 102/46  Pulse:    60  Temp:    98.3 F (36.8 C)  TempSrc:    Oral  Resp:    16  Height:      Weight:  122 lb 4.8 oz (55.475 kg)    SpO2:    96%    Intake/Output from previous day:  Intake/Output Summary (Last 24 hours) at 07/03/15 0859 Last data filed at 07/02/15 1957  Gross per 24 hour  Intake    265 ml  Output      0 ml  Net    265 ml    I/O since admission: +265  Wt Readings from Last 3 Encounters:  07/03/15 122 lb 4.8 oz (55.475 kg)  06/10/15 128 lb 6.4 oz (58.242 kg)  03/11/15 129 lb 3.2 oz (58.605 kg)    Medications: . apixaban  2.5 mg Oral BID  . cholecalciferol  2,000 Units Oral Daily  . metoprolol tartrate  50 mg Oral BID       Physical Exam:   General appearance: alert, cooperative and no distress Neck: no adenopathy, no carotid bruit, no JVD, supple, symmetrical, trachea midline and thyroid not enlarged, symmetric, no tenderness/mass/nodules Lungs: no rales or wheezing Heart: regular rate and rhythm and 1/67 sem Abdomen: soft, non-tender; bowel sounds normal; no masses,  no organomegaly Extremities: no edema, redness or tenderness in the calves or thighs Pulses: 2+ and symmetric Neurologic: Grossly normal   Rate: 74  Rhythm: normal sinus rhythm  ECG  At 20:22 on 07/02/15 (independently read by me): SB at 50; PRWP   Prior ECG at 8:36 on 07/02/15:   AF at 130 with NSSTT changes          Lab Results:   Recent Labs  07/02/15 0830 07/03/15 0440  NA 144 143  K 3.7 3.9  CL 105 104  CO2 29 27  GLUCOSE 110* 109*  BUN 16 12  CREATININE 0.67 0.70  CALCIUM 9.4 9.6  MG  --  2.1    No flowsheet data found.   Recent Labs  07/02/15 0830 07/03/15 0440  WBC 5.5 4.9  HGB 15.6* 14.4  HCT 47.7* 43.6  MCV 89.0 89.2  PLT 154 161     Recent Labs  07/02/15 1709  07/02/15 2303 07/03/15 0440  TROPONINI 0.04* <0.03 <0.03    Lab Results  Component Value Date   TSH 2.443 07/02/2015    Recent Labs  07/02/15 1709  HGBA1C 5.7*    No results for input(s): PROT, ALBUMIN, AST, ALT, ALKPHOS, BILITOT, BILIDIR, IBILI in the last 72 hours. No results for input(s): INR in the last 72 hours. BNP (last 3 results) No results for input(s): BNP in the last 8760 hours.  ProBNP (last 3 results) No results for input(s): PROBNP in the last 8760 hours.   Lipid Panel     Component Value Date/Time   CHOL 163 07/03/2015 0440   TRIG 73 07/03/2015 0440   HDL 57 07/03/2015 0440   CHOLHDL 2.9 07/03/2015 0440   VLDL 15 07/03/2015 0440   LDLCALC 91 07/03/2015 0440    Imaging:  Dg Chest 2 View  07/02/2015  CLINICAL DATA:  A irregular heartbeat.  Palpitations. EXAM: CHEST - 2 VIEW COMPARISON:  CT of the chest 01/05/2015. FINDINGS: The heart size is  normal. There is no edema or effusion. Emphysematous changes are again noted. 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 There is no significant progression. No superimposed airspace disease is evident. The visualized soft tissues and bony thorax are unremarkable. IMPRESSION: 1. No acute cardiopulmonary disease or significant interval change. 2. Stable chronic changes in the right middle lobe and upper lobes bilaterally as described. 3. Emphysema. Electronically Signed   By: San Morelle M.D.   On: 07/02/2015 09:22      Assessment/Plan:   Active Problems:   Atrial fibrillation with rapid ventricular response (HCC)   Atrial fibrillation (HCC)   Converted to NSR: now on increased lopressor 50 mg bid, off cardizem drip and on eliquis for anticoagulation.  Cha2ds2vasc score is 3.  Will dc today; arrange for f/u with APP and Dr. Irish Lack.    Troy Sine, MD, [facility] 07/03/2015, 8:59 AM

## 2015-07-03 NOTE — Progress Notes (Signed)
  Echocardiogram 2D Echocardiogram has been performed.  [name] 07/03/2015, 2:30 PM

## 2015-07-03 NOTE — Progress Notes (Signed)
Data: Patient complained of some light headedness at 2000. Heart rate 40s and patient is oriented x4. See echarting for additional data.  Action: obtained stat EKG, notified Cardiology fellow, and reiterated the importance of getting assistance with ambulation for safety.  Response: patient safe. Cardiology fellow advised to continue to monitor and hold/ discontinue cardizem infusion

## 2015-07-03 NOTE — Care Management Obs Status (Addendum)
[place] NOTIFICATION   Patient Details  Name: [name] MRN: [id number] Date of Birth: 06-Nov-1930   Medicare Observation Status Notification Given:  Yes    Bethena Roys, RN 07/03/2015, 10:09 AM

## 2015-07-03 NOTE — Progress Notes (Signed)
Just find out that echo reading is pending. The patient is discharged. Please review during office visit.   Casmira Cramer, [place]

## 2015-07-03 NOTE — Care Management (Addendum)
1010 07-03-15 Jacqlyn Krauss, RN,BSN [phone] CM did provide pt with the Eliquis 30 day free card. Pt will need Rx to go along with card. Pt uses [facility] on [address] and  Medication is not  available. CM did call the pharmacy on [address] and medication is available. Pharmacy to make pt aware of co pay thereafter. CM did make staff RN Marya Amsler aware that pt will need Rx for 30 day supply and where medication is available. Pt is from home with daughter. No further needs from CM at this time. Bethena Roys, RN BSN

## 2015-07-03 NOTE — Discharge Summary (Signed)
Discharge Summary    Patient ID: DIOSELIN EDKIN,  MRN: [id number], DOB/AGE: 09/14/1930 82 y.o.  Admit date: 07/02/2015 Discharge date: 07/03/2015  Primary Care Provider: GATES,ROBERT NEVILL Primary Cardiologist: Irish Lack  Discharge Diagnoses    Principal Problem:   Atrial fibrillation with rapid ventricular response [facility]) Active Problems:   Paroxysmal atrial fibrillation (HCC)   Allergies Allergies  Allergen Reactions  . Augmentin [Amoxicillin-Pot Clavulanate] Nausea And Vomiting  . Biaxin [Clarithromycin] Nausea And Vomiting  . Tequin [Gatifloxacin] Nausea Only    Diagnostic Studies/Procedures    CHEST - 2 VIEW  COMPARISON: CT of the chest 01/05/2015.  FINDINGS: The heart size is normal. There is no edema or effusion. Emphysematous changes are again noted. 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 There is no significant progression. No superimposed airspace disease is evident. The visualized soft tissues and bony thorax are unremarkable.  IMPRESSION: 1. No acute cardiopulmonary disease or significant interval change. 2. Stable chronic changes in the right middle lobe and upper lobes bilaterally as described. 3. Emphysema. _____________   History of Present Illness     The patient is a pleasant 82 year old Caucasian female with past medical history of bronchiectasis with multiple pneumonia, chronic moderate hearing loss and chronic tinnitus. she had a clean cath in 2010. She had a [employer] screening that reported atrial fibrillation, however upon further review affect her cardiologist, it was sinus rhythm with PAC in 2014. She has been on metoprolol 25 mg twice a day at home and has been compliant with her medication.   Last night around 8 PM, she had acute onset of palpitation. She denies any chest discomfort, dizziness, or shortness  of breath. She denies recent sign of infection. She did take her nighttime metoprolol dose, however did not take any extra dose. Around 4 AM this morning, since the her symptom has persisted, she decided to seek medical attention at [facility] and noticed she was in atrial fibrillation with RVR. She was placed on IV diltiazem and subsequently transferred to [facility] for further evaluation. She does drink one beer per night, however TSH was negative.    Hospital Course     Patient was admitted with a new onset atrial fibrillation and was initially started on IV diltiazem.  CHA2DS2-Vasc score 3 (female, age).  She was started on an age and weight adjusted dose of Eliquis 2.5 mg twice daily. Metoprolol was increased to 50 mg twice daily.  She spontaneously converted to normal sinus rhythm off of Cardizem.  The patient was seen by Dr. Claiborne Billings who felt she was stable for DC home.   Consultants: None   _____________  Discharge Vitals Blood pressure 115/59, pulse 78, temperature 98 F (36.7 C), temperature source Oral, resp. rate 16, height 5\' 2"  (1.575 m), weight 122 lb 4.8 oz (55.475 kg), SpO2 98 %.  Filed Weights   07/02/15 0828 07/02/15 1158 07/03/15 0526  Weight: 127 lb (57.607 kg) 123 lb 8 oz (56.019 kg) 122 lb 4.8 oz (55.475 kg)    Labs & Radiologic Studies     CBC  Recent Labs  07/02/15 0830 07/03/15 0440  WBC 5.5 4.9  HGB 15.6* 14.4  HCT 47.7* 43.6  MCV 89.0 89.2  PLT 154 [id number]   Basic Metabolic Panel  Recent Labs  07/02/15 0830 07/03/15 0440  NA 144 143  K 3.7 3.9  CL 105 104  CO2 29 27  GLUCOSE 110* 109*  BUN 16 12  CREATININE 0.67 0.70  CALCIUM 9.4 9.6  MG  --  2.1   Liver Function Tests No results for input(s): AST, ALT, ALKPHOS, BILITOT, PROT, ALBUMIN in the last 72 hours. No results for input(s): LIPASE, AMYLASE in the last 72 hours. Cardiac Enzymes  Recent Labs  07/02/15 1709 07/02/15 2303 07/03/15 0440  TROPONINI 0.04* <0.03  <0.03   BNP Invalid input(s): POCBNP D-Dimer No results for input(s): DDIMER in the last 72 hours. Hemoglobin A1C  Recent Labs  07/02/15 1709  HGBA1C 5.7*   Fasting Lipid Panel  Recent Labs  07/03/15 0440  CHOL 163  HDL 57  LDLCALC 91  TRIG 73  CHOLHDL 2.9   Thyroid Function Tests  Recent Labs  07/02/15 0830  TSH 2.443    Dg Chest 2 View  07/02/2015  CLINICAL DATA:  A irregular heartbeat.  Palpitations. EXAM: CHEST - 2 VIEW COMPARISON:  CT of the chest 01/05/2015. FINDINGS: The heart size is normal. There is no edema or effusion. Emphysematous changes are again noted. 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 There is no significant progression. No superimposed airspace disease is evident. The visualized soft tissues and bony thorax are unremarkable. IMPRESSION: 1. No acute cardiopulmonary disease or significant interval change. 2. Stable chronic changes in the right middle lobe and upper lobes bilaterally as described. 3. Emphysema. Electronically Signed   By: San Morelle M.D.   On: 07/02/2015 09:22    Disposition   Pt is being discharged home today in good condition.  Follow-up Plans & Appointments    Follow-up Information    Follow up with GATES,ROBERT NEVILL, MD.   Specialty:  Internal Medicine   Contact information:   [address] [place] [address] [phone]       Follow up with Jettie Booze., MD.   Specialties:  Cardiology, Radiology, Interventional Cardiology   Why:  Office will call you with your follow up appointment date and time   Contact information:   [id number] [place] [address] [phone]      Discharge Instructions    Diet - low sodium heart healthy    Complete by:  As directed      Increase activity slowly    Complete by:  As directed             Discharge Medications   Current Discharge Medication List    START taking these medications   Details  apixaban (ELIQUIS) 2.5 MG TABS tablet Take 1 tablet (2.5 mg total) by mouth 2 (two) times daily. Qty: 60 tablet, Refills: 11      CONTINUE these medications which have CHANGED   Details  metoprolol (LOPRESSOR) 50 MG tablet Take 1 tablet (50 mg total) by mouth 2 (two) times daily. Qty: 60 tablet, Refills: 11      CONTINUE these medications which have NOT CHANGED   Details  Cholecalciferol (VITAMIN D) 2000 UNITS CAPS Take 2,000 Units by mouth daily.    LORazepam (ATIVAN) 0.5 MG tablet Take 0.25 mg by mouth 2 (two) times daily as needed for anxiety.  Refills: 0    Polyethyl Glycol-Propyl Glycol (SYSTANE OP) Place 1 drop into both eyes daily as needed (dry eyes).    Fluticasone-Salmeterol (ADVAIR) 250-50 MCG/DOSE AEPB Inhale 1 puff into the lungs every 12 (twelve) hours.  Qty: 60 each, Refills: 6      STOP taking these medications     fluticasone-salmeterol (ADVAIR HFA) 115-21 MCG/ACT inhaler            Outstanding Labs/Studies     Duration of Discharge Encounter   Greater than 30 minutes including physician time.  Signed, Lurene Robley, [place] PAC 07/03/2015, 11:30 AM

## 2015-07-07 NOTE — Progress Notes (Signed)
Cardiology Office Note:    Date:  07/08/2015   ID:  SERAPHIM MANGELSDORF, DOB 25-Nov-1930, MRN [id number]  PCP:  Henrine Screws, MD  Cardiologist:  Dr. Casandra Doffing   Electrophysiologist:  n/a  Chief Complaint  Patient presents with  . Hospitalization Follow-up    Atrial fibrillation    History of Present Illness:     [name] is a 82 y.o. female with a hx of bronchiectasis, PACs.    Admitted 2/3-2/4 with AF with RVR.CHADS2-VASc= 3. She was placed on Eliquis 2.5 mg twice a day (age 47, wt 88 kg) for anticoagulation. She was placed on IV diltiazem for rate control. Beta blocker dose was increased. She converted spontaneously to NSR. She was discharged on higher dose of metoprolol 50 mg twice a day.  Returns for FU.  Here today with her daughter. Overall doing well. She denies chest pain. Breathing is stable. She denies any bleeding issues. Denies orthopnea, PND or edema. Denies syncope. She does get lightheaded at times after taking her metoprolol. She has noted occasional palpitations but no sustained rapid palpitations.   Past Medical History  Diagnosis Date  . DJD (degenerative joint disease) of knee   . Bronchiectasis ([place])     with multiple pneumonia back in 1990's  . Bronchitis     episodic  . PVC's (premature ventricular contractions)     on EKG, controlled on atenolol and caffeine reduction  . History of nuclear stress test 11/07    NORMAL  . GERD (gastroesophageal reflux disease)     episodic symptoms  . Hearing loss     chronic moderate hearing loss, worked up by ENT and hearing aids have been recommended  . Tinnitus     chronic  . Insomnia     Past Surgical History  Procedure Laterality Date  . Cyst removed      left breast    Current Medications:  Outpatient Encounter Prescriptions as of 07/08/2015  Medication Sig Note  . apixaban (ELIQUIS) 2.5 MG TABS tablet Take 1 tablet (2.5 mg total) by mouth 2 (two) times daily.   . Cholecalciferol (VITAMIN D)  2000 UNITS CAPS Take 2,000 Units by mouth daily.   Marland Kitchen LORazepam (ATIVAN) 0.5 MG tablet Take 0.25 mg by mouth 2 (two) times daily as needed for anxiety. Reported on 07/08/2015   . metoprolol (LOPRESSOR) 50 MG tablet Take 1 tablet (50 mg total) by mouth 2 (two) times daily.   Vladimir Faster Glycol-Propyl Glycol (SYSTANE OP) Place 1 drop into both eyes daily as needed (dry eyes).   . Fluticasone-Salmeterol (ADVAIR) 250-50 MCG/DOSE AEPB Inhale 1 puff into the lungs every 12 (twelve) hours. (Patient not taking: Reported on 07/08/2015) 07/02/2015: Pt will start taking this in about a month after completing current Advair 115-21 inhaler   No facility-administered encounter medications on file as of 07/08/2015.     Allergies:   Augmentin; Biaxin; and Tequin   Social History   Social History  . Marital Status: Divorced    Spouse Name: N/A  . Number of Children: N/A  . Years of Education: N/A   Occupational History  . retired    Social History Main Topics  . Smoking status: Former Smoker -- 0.50 packs/day for 20 years    Types: Cigarettes    Quit date: 05/29/1988  . Smokeless tobacco: Never Used  . Alcohol Use: 0.0 oz/week    0 Standard drinks or equivalent per week     Comment: 1 can  of beer at night  . Drug Use: No  . Sexual Activity: Not Asked   Other Topics Concern  . None   Social History Narrative     Family History:  The patient's family history includes Asthma in her sister; Clotting disorder in her sister; Hypertension in her father and mother; Stroke in her father and mother.   ROS:   Please see the history of present illness.    ROS All other systems reviewed and are negative.   Physical Exam:    VS:  BP 150/70 mmHg  Pulse 68  Ht 5\' 2"  (1.575 m)  Wt 124 lb 6.4 oz (56.427 kg)  BMI 22.75 kg/m2   GEN: Well nourished, well developed, in no acute distress HEENT: normal Neck: no JVD, no masses Cardiac: Normal S1/S2, RRR; no murmurs, no edema;  Respiratory:  clear to  auscultation bilaterally; no wheezing, rhonchi or rales GI: soft, nontender, nondistended, + BS MS: no deformity or atrophy Skin: warm and dry, no rash Neuro:  no focal deficits  Psych: Alert and oriented x 3, normal affect  Wt Readings from Last 3 Encounters:  07/08/15 124 lb 6.4 oz (56.427 kg)  07/03/15 122 lb 4.8 oz (55.475 kg)  06/10/15 128 lb 6.4 oz (58.242 kg)      Studies/Labs Reviewed:     EKG:  EKG is  ordered today.  The ekg ordered today demonstrates NSR, HR 77, normal axis, nonspecific ST-T wave changes, QTc 432 ms  Recent Labs: 07/02/2015: TSH 2.443 07/03/2015: BUN 12; Creatinine, Ser 0.70; Hemoglobin 14.4; Magnesium 2.1; Platelets 161; Potassium 3.9; Sodium 143   Recent Lipid Panel    Component Value Date/Time   CHOL 163 07/03/2015 0440   TRIG 73 07/03/2015 0440   HDL 57 07/03/2015 0440   CHOLHDL 2.9 07/03/2015 0440   VLDL 15 07/03/2015 0440   LDLCALC 91 07/03/2015 0440    Additional studies/ records that were reviewed today include:   Echo 07/03/15 EF 55-60%, normal wall motion  ETT 3/14 Negative   LHC 10/10 Normal coronary arteries EF 60%  Myoview 10/10 EF 80%, minimal attenuation artifact in anterior region of myocardium, small reversible defect in the apex  ASSESSMENT:     1. Paroxysmal atrial fibrillation (HCC)   2. COPD mixed type ([place])   3. Elevated blood pressure     PLAN:     In order of problems listed above:  1. PAF - Recent admit for AF with RVR. She converted to NSR on rate controlling medication.  Maintaining NSR.  CHADS2-VASc=3.  Based upon age and weight, she should remain on Eliquis 2.5 mg bid for anticoagulation.  She has been lightheaded at times.  -  BMET/CBC in 6 weeks.  -  Refer to Anticoagulation Clinic for FU in 6 weeks.  -  If she continues to experience lightheadedness, decrease metoprolol to 37.5 mg twice a day  2. COPD - FU with Pulmonology as planned. There was some confusion regarding her Advair. Our service did not  change her medication. This was changed prior to her hospitalization by her pulmonologist. We did discuss the possibility of salmeterol contributing to tachycardia. I encouraged her to contact her pulmonologist to see if there are any alternatives.  3. Elevated BP - Blood pressure at home has been optimal.    Medication Adjustments/Labs and Tests Ordered: Current medicines are reviewed at length with the patient today.  Concerns regarding medicines are outlined above.  Medication changes, Labs and Tests ordered today are  outlined in the Patient Instructions noted below. Patient Instructions  Medication Instructions:  OK TO TAKE ADVAIR; MAKE SURE TO CHECK WITH DR. NADEL ABOUT ADVAIR IF OK TO TAKE  Labwork: 6 WEEKS FOR BMET, CBC  Testing/Procedures: NONE  Follow-Up: 1. DR. Irish Lack IN 3MONTHS  2. YOU WILL NEED A NEW PT APPT WITH THE COUMADIN CLINIC FOR NEW ELIQUIS; THIS WILL NEED TO BE IN 6 WEEKS; SAME DAY AS LAB WORK  Any Other Special Instructions Will Be Listed Below (If Applicable).  If you need a refill on your cardiac medications before your next appointment, please call your pharmacy.     Signed, Richardson Dopp, PA-C  07/08/2015 9:55 AM    [facility] [address], [place], [place]  [address] Phone: [phone]; Fax: [phone]

## 2015-07-08 ENCOUNTER — Encounter: Payer: Self-pay | Admitting: Physician Assistant

## 2015-07-08 ENCOUNTER — Ambulatory Visit (INDEPENDENT_AMBULATORY_CARE_PROVIDER_SITE_OTHER): Payer: Medicare Other | Admitting: Physician Assistant

## 2015-07-08 VITALS — BP 150/70 | HR 68 | Ht 62.0 in | Wt 124.4 lb

## 2015-07-08 DIAGNOSIS — J449 Chronic obstructive pulmonary disease, unspecified: Secondary | ICD-10-CM

## 2015-07-08 DIAGNOSIS — I480 Paroxysmal atrial fibrillation: Secondary | ICD-10-CM | POA: Diagnosis not present

## 2015-07-08 DIAGNOSIS — R030 Elevated blood-pressure reading, without diagnosis of hypertension: Secondary | ICD-10-CM | POA: Diagnosis not present

## 2015-07-08 DIAGNOSIS — IMO0001 Reserved for inherently not codable concepts without codable children: Secondary | ICD-10-CM

## 2015-07-08 NOTE — Patient Instructions (Addendum)
Medication Instructions:  OK TO TAKE ADVAIR; MAKE SURE TO CHECK WITH DR. NADEL ABOUT ADVAIR IF OK TO TAKE  Labwork: 6 WEEKS FOR BMET, CBC  Testing/Procedures: NONE  Follow-Up: 1. DR. Irish Lack IN 3MONTHS  2. YOU WILL NEED A NEW PT APPT WITH THE COUMADIN CLINIC FOR NEW ELIQUIS; THIS WILL NEED TO BE IN 6 WEEKS; SAME DAY AS LAB WORK  Any Other Special Instructions Will Be Listed Below (If Applicable).  If you need a refill on your cardiac medications before your next appointment, please call your pharmacy.

## 2015-07-12 DIAGNOSIS — I4891 Unspecified atrial fibrillation: Secondary | ICD-10-CM | POA: Diagnosis not present

## 2015-07-12 DIAGNOSIS — R002 Palpitations: Secondary | ICD-10-CM | POA: Diagnosis not present

## 2015-07-12 DIAGNOSIS — F411 Generalized anxiety disorder: Secondary | ICD-10-CM | POA: Diagnosis not present

## 2015-08-19 ENCOUNTER — Ambulatory Visit (INDEPENDENT_AMBULATORY_CARE_PROVIDER_SITE_OTHER): Payer: Medicare Other | Admitting: *Deleted

## 2015-08-19 ENCOUNTER — Other Ambulatory Visit (INDEPENDENT_AMBULATORY_CARE_PROVIDER_SITE_OTHER): Payer: Medicare Other

## 2015-08-19 DIAGNOSIS — I480 Paroxysmal atrial fibrillation: Secondary | ICD-10-CM

## 2015-08-19 DIAGNOSIS — I4891 Unspecified atrial fibrillation: Secondary | ICD-10-CM | POA: Diagnosis not present

## 2015-08-19 LAB — CBC WITH DIFFERENTIAL/PLATELET
BASOS PCT: 0 % (ref 0–1)
Basophils Absolute: 0 10*3/uL (ref 0.0–0.1)
EOS ABS: 0.2 10*3/uL (ref 0.0–0.7)
Eosinophils Relative: 3 % (ref 0–5)
HCT: 41.6 % (ref 36.0–46.0)
HEMOGLOBIN: 13.9 g/dL (ref 12.0–15.0)
Lymphocytes Relative: 34 % (ref 12–46)
Lymphs Abs: 1.9 10*3/uL (ref 0.7–4.0)
MCH: 29.4 pg (ref 26.0–34.0)
MCHC: 33.4 g/dL (ref 30.0–36.0)
MCV: 87.9 fL (ref 78.0–100.0)
MPV: 9.7 fL (ref 8.6–12.4)
Monocytes Absolute: 0.6 10*3/uL (ref 0.1–1.0)
Monocytes Relative: 10 % (ref 3–12)
NEUTROS ABS: 2.9 10*3/uL (ref 1.7–7.7)
Neutrophils Relative %: 53 % (ref 43–77)
PLATELETS: 179 10*3/uL (ref 150–400)
RBC: 4.73 MIL/uL (ref 3.87–5.11)
RDW: 13.9 % (ref 11.5–15.5)
WBC: 5.5 10*3/uL (ref 4.0–10.5)

## 2015-08-19 LAB — BASIC METABOLIC PANEL
BUN: 13 mg/dL (ref 7–25)
CALCIUM: 9.3 mg/dL (ref 8.6–10.4)
CO2: 30 mmol/L (ref 20–31)
CREATININE: 0.63 mg/dL (ref 0.60–0.88)
Chloride: 103 mmol/L (ref 98–110)
Glucose, Bld: 85 mg/dL (ref 65–99)
Potassium: 4 mmol/L (ref 3.5–5.3)
SODIUM: 141 mmol/L (ref 135–146)

## 2015-08-19 NOTE — Patient Instructions (Addendum)
Pt was started on Eliquis 2.5mg   Bid every 12hours  for  Atrial Fib on  07/03/2015.    Reviewed patients medication list.  Pt is not  currently on any combined P-gp and strong CYP3A4 inhibitors/inducers (ketoconazole, traconazole, ritonavir, carbamazepine, phenytoin, rifampin, St. John's wort).  Reviewed labs.  SCr 0.63 , Weight 56.8kg ,   Dose is appropriate based on Age Weight and SrCr.   Hgb  13.9 and HCT 41.6  A full discussion of the nature of anticoagulants has been carried out.  A benefit/risk analysis has been presented to the patient, so that they understand the justification for choosing anticoagulation with Eliquis at this time.  The need for compliance is stressed.  Pt is aware to take the medication twice daily.  Side effects of potential bleeding are discussed, including unusual colored urine or stools, coughing up blood or coffee ground emesis, nose bleeds or serious fall or head trauma.  Discussed signs and symptoms of stroke. The patient should avoid any OTC items containing aspirin or ibuprofen.  Avoid alcohol consumption.   Call if any signs of abnormal bleeding.  Discussed financial obligations and pt states she is not having  any difficulty in obtaining medication.  Next lab test test in 6 months.  Pt states that she has had no sign or symptom of bleeding nor sign or symptom of stroke Pt states she  has tolerated Eliquis CBC and BMET done today and instructed that will call in am with results 08/20/2015 Spoke with pt and instructed that she is on the correct dose of Eliquis 2.5mg  bid 1 every 12 hours and to continue this and made an appt for her to be seen in 6 months and she states understanding

## 2015-08-20 ENCOUNTER — Telehealth: Payer: Self-pay | Admitting: *Deleted

## 2015-08-20 NOTE — Telephone Encounter (Signed)
Pt has been notified of lab results and advised to continue on current dose of Eliquis 2.5 mg BID. Pt states since she is "new to having blood thinners" and who should she call if she is having bleeding, PCP, cardiology, ER? I advised she can always call our office and ask for a Triage nurse and they can help decide what path of treatment for her would be best if she has any bleeding problems. Pt said thank you for taking the time go over this with her.

## 2015-08-23 DIAGNOSIS — I479 Paroxysmal tachycardia, unspecified: Secondary | ICD-10-CM | POA: Diagnosis not present

## 2015-08-23 DIAGNOSIS — R413 Other amnesia: Secondary | ICD-10-CM | POA: Diagnosis not present

## 2015-08-23 DIAGNOSIS — F419 Anxiety disorder, unspecified: Secondary | ICD-10-CM | POA: Diagnosis not present

## 2015-08-23 DIAGNOSIS — J479 Bronchiectasis, uncomplicated: Secondary | ICD-10-CM | POA: Diagnosis not present

## 2015-08-24 ENCOUNTER — Encounter (HOSPITAL_COMMUNITY): Payer: Self-pay | Admitting: Emergency Medicine

## 2015-08-24 ENCOUNTER — Emergency Department (HOSPITAL_COMMUNITY): Payer: Medicare Other

## 2015-08-24 ENCOUNTER — Emergency Department (HOSPITAL_COMMUNITY)
Admission: EM | Admit: 2015-08-24 | Discharge: 2015-08-24 | Disposition: A | Discharge status: Home / Self Care | Payer: Medicare Other | Attending: Emergency Medicine | Admitting: Emergency Medicine

## 2015-08-24 DIAGNOSIS — Z87891 Personal history of nicotine dependence: Secondary | ICD-10-CM | POA: Insufficient documentation

## 2015-08-24 DIAGNOSIS — R0602 Shortness of breath: Secondary | ICD-10-CM | POA: Diagnosis present

## 2015-08-24 DIAGNOSIS — Z8719 Personal history of other diseases of the digestive system: Secondary | ICD-10-CM | POA: Insufficient documentation

## 2015-08-24 DIAGNOSIS — H9190 Unspecified hearing loss, unspecified ear: Secondary | ICD-10-CM | POA: Insufficient documentation

## 2015-08-24 DIAGNOSIS — Z79899 Other long term (current) drug therapy: Secondary | ICD-10-CM | POA: Diagnosis not present

## 2015-08-24 DIAGNOSIS — R002 Palpitations: Secondary | ICD-10-CM

## 2015-08-24 LAB — BASIC METABOLIC PANEL
ANION GAP: 9 (ref 5–15)
BUN: 10 mg/dL (ref 6–20)
CO2: 27 mmol/L (ref 22–32)
Calcium: 9.4 mg/dL (ref 8.9–10.3)
Chloride: 107 mmol/L (ref 101–111)
Creatinine, Ser: 0.71 mg/dL (ref 0.44–1.00)
GFR calc Af Amer: >60 mL/min (ref >60)
Glucose, Bld: 112 mg/dL — ABNORMAL HIGH (ref 65–99)
POTASSIUM: 4.2 mmol/L (ref 3.5–5.1)
SODIUM: 143 mmol/L (ref 135–145)

## 2015-08-24 LAB — CBC
HEMATOCRIT: 45 % (ref 36.0–46.0)
HEMOGLOBIN: 14.3 g/dL (ref 12.0–15.0)
MCH: 28.6 pg (ref 26.0–34.0)
MCHC: 31.8 g/dL (ref 30.0–36.0)
MCV: 90 fL (ref 78.0–100.0)
Platelets: 171 10*3/uL (ref 150–400)
RBC: 5 MIL/uL (ref 3.87–5.11)
RDW: 13.6 % (ref 11.5–15.5)
WBC: 5.8 10*3/uL (ref 4.0–10.5)

## 2015-08-24 LAB — I-STAT TROPONIN, ED
Troponin i, poc: 0 ng/mL (ref 0.00–0.08)
Troponin i, poc: 0 ng/mL (ref 0.00–0.08)

## 2015-08-24 NOTE — ED Notes (Signed)
Ambulatory w/ steady gait to restroom. 

## 2015-08-24 NOTE — ED Notes (Signed)
Pt from home sts hx of afib that started last night with hx of same; pt sts took home meds without relief; pt sts some SOB and takes eloquis

## 2015-08-24 NOTE — ED Provider Notes (Signed)
CSN: [id number]     Arrival date & time 08/24/15  1021 History   First MD Initiated Contact with Patient 08/24/15 1107     Chief Complaint  Patient presents with  . Shortness of Breath  . Atrial Fibrillation   (Consider location/radiation/quality/duration/timing/severity/associated sxs/prior Treatment) HPI  82 y.o. female with a hx of AFib, COPD, presents to the Emergency Department today complaining of SOB, Palpitations since last night. States she was unable to sleep last night due to the palpitations. No chest pain last night or currently. No N/V/D. No headache. No SOB/ABD pain. No fevers. No numbness/tingling. Currently on Diltiazem and Metoprolol as well as Eliquis. States home medication are not working. Has been on medication since the beginning of February. No other symptoms noted.   Cardiology- Irish Lack  Past Medical History  Diagnosis Date  . DJD (degenerative joint disease) of knee   . Bronchiectasis ([place])     with multiple pneumonia back in 1990's  . Bronchitis     episodic  . PVC's (premature ventricular contractions)     on EKG, controlled on atenolol and caffeine reduction  . History of nuclear stress test 11/07    NORMAL  . GERD (gastroesophageal reflux disease)     episodic symptoms  . Hearing loss     chronic moderate hearing loss, worked up by ENT and hearing aids have been recommended  . Tinnitus     chronic  . Insomnia    Past Surgical History  Procedure Laterality Date  . Cyst removed      left breast   Family History  Problem Relation Age of Onset  . Stroke Father   . Hypertension Father   . Stroke Mother   . Hypertension Mother   . Asthma Sister   . Clotting disorder Sister    Social History  Substance Use Topics  . Smoking status: Former Smoker -- 0.50 packs/day for 20 years    Types: Cigarettes    Quit date: 05/29/1988  . Smokeless tobacco: Never Used  . Alcohol Use: 0.0 oz/week    0 Standard drinks or equivalent per week     Comment: 1  can of beer at night   OB History    No data available     Review of Systems ROS reviewed and all are negative for acute change except as noted in the HPI.  Allergies  Augmentin; Biaxin; and Tequin  Home Medications   Prior to Admission medications   Medication Sig Start Date End Date Taking? Authorizing Provider  apixaban (ELIQUIS) 2.5 MG TABS tablet Take 1 tablet (2.5 mg total) by mouth 2 (two) times daily. 07/03/15   Brett Canales, PA-C  Cholecalciferol (VITAMIN D) 2000 UNITS CAPS Take 2,000 Units by mouth daily.    Historical Provider, MD  Fluticasone-Salmeterol (ADVAIR) 250-50 MCG/DOSE AEPB Inhale 1 puff into the lungs every 12 (twelve) hours. Patient not taking: Reported on 07/08/2015 06/10/15   Noralee Space, MD  LORazepam (ATIVAN) 0.5 MG tablet Take 0.25 mg by mouth 2 (two) times daily as needed for anxiety. Reported on 07/08/2015 11/11/14   Historical Provider, MD  metoprolol succinate (TOPROL-XL) 25 MG 24 hr tablet Takes 1 tablet in the Am and 2 tablets in the PM    Historical Provider, MD  Polyethyl Glycol-Propyl Glycol (SYSTANE OP) Place 1 drop into both eyes daily as needed (dry eyes).    Historical Provider, MD   BP 129/73 mmHg  Pulse 76  Temp(Src) 97.8 F (36.6 C) (  Oral)  Resp 18  SpO2 96%   Physical Exam  Constitutional: She is oriented to person, place, and time. She appears well-developed and well-nourished.  HENT:  Head: Normocephalic and atraumatic.  Eyes: EOM are normal.  Neck: Normal range of motion.  Cardiovascular: Normal rate, regular rhythm, normal heart sounds, intact distal pulses and normal pulses.   No murmur heard. Pulmonary/Chest: Effort normal and breath sounds normal.  Abdominal: Soft.  Musculoskeletal: Normal range of motion.  Neurological: She is alert and oriented to person, place, and time.  Skin: Skin is warm and dry.  Psychiatric: She has a normal mood and affect. Her behavior is normal. Thought content normal.  Nursing note and vitals  reviewed.  ED Course  Procedures (including critical care time) Labs Review Labs Reviewed  BASIC METABOLIC PANEL - Abnormal; Notable for the following:    Glucose, Bld 112 (*)    All other components within normal limits  CBC  I-STAT TROPOININ, ED  I-STAT TROPOININ, ED   Imaging Review Dg Chest 2 View  08/24/2015  CLINICAL DATA:  Shortness of Breath and atrial fibrillation EXAM: CHEST  2 VIEW COMPARISON:  July 02, 2015 chest radiograph and chest CT January 05, 2015 ; chest radiograph August 31, 2010 FINDINGS: The lungs are somewhat hyperexpanded with evidence of underlying emphysematous change. There is chronic scarring in both upper lobe regions. There is persistent nodular opacity in the periphery of the left upper lobe. There is no frank edema or consolidation. The heart size is unchanged and within normal limits. Mild distortion of the pulmonary vascularity is stable. No adenopathy is evident. There is degenerative change in the thoracic spine. IMPRESSION: Underlying emphysema. Areas of scarring in both upper lobes. Nodularity in the periphery of the left upper lobe is stable. No new opacity. No change in cardiac silhouette. Electronically Signed   By: Lowella Grip III M.D.   On: 08/24/2015 11:18   I have personally reviewed and evaluated these images and lab results as part of my medical decision-making.   EKG Interpretation   Date/Time:  Tuesday August 24 2015 10:34:02 EDT Ventricular Rate:  80 PR Interval:  164 QRS Duration: 58 QT Interval:  356 QTC Calculation: 410 R Axis:   100 Text Interpretation:  Normal sinus rhythm Right atrial enlargement  Rightward axis Pulmonary disease pattern Septal infarct , age undetermined  Abnormal ECG No significant change since last tracing Confirmed by  [facility] MD, [place] ([id number]) on 08/24/2015 11:47:05 AM      MDM  I have reviewed and evaluated the relevant laboratory values.I have reviewed and evaluated the relevant imaging studies.I  personally evaluated and interpreted the relevant EKG.I have reviewed the relevant previous healthcare records.I have reviewed EMS Documentation.I obtained HPI from historian. Patient discussed with supervising physician  ED Course:  Assessment: Pt is a 84yF presents with palpitations since last night. Hx Afib. No CP/SOB. Patient is to be discharged with recommendation to follow up with PCP in regards to today's hospital visit. Palpitations is not likely of cardiac or pulmonary etiology d/t presentation, perc negative, VSS, no tracheal deviation, no JVD or new murmur, RRR, breath sounds equal bilaterally, EKG without acute abnormalities, and no RVR, negative troponin x2, and negative CXR. Pt has been advised start a PPI and return to the ED if CP occurs and becomes exertional, associated with diaphoresis or nausea, radiates to left jaw/arm, worsens or becomes concerning in any way. Most likely anxiety driven. Recently placed on Zoloft and has not taken  x1 week yet for medication to be effective. Pt appears reliable for follow up and is agreeable to discharge. Patient is in no acute distress. Vital Signs are stable. Patient is able to ambulate. Patient able to tolerate PO.   Disposition/Plan:  DC Home Additional Verbal discharge instructions given and discussed with patient.  Pt Instructed to f/u with PCP for evaluation and treatment of symptoms. Return precautions given Pt acknowledges and agrees with plan  Supervising Physician Gareth Morgan, MD   Final diagnoses:  Palpitations     Shary Decamp, PA-C 08/24/15 1531  Gareth Morgan, MD 08/24/15 2255

## 2015-08-24 NOTE — Discharge Instructions (Signed)
Please read and follow all provided instructions.  Your diagnoses today include:  1. Palpitations    Tests performed today include:  An EKG of your heart  A chest x-ray  Cardiac enzymes - a blood test for heart muscle damage  Blood counts and electrolytes  Vital signs. See below for your results today.   Medications prescribed:   Take any prescribed medications only as directed.  Follow-up instructions: Please follow-up with yourCardiologist as soon as you can for further evaluation of your symptoms.   Return instructions:  SEEK IMMEDIATE MEDICAL ATTENTION IF:  You have severe chest pain, especially if the pain is crushing or pressure-like and spreads to the arms, back, neck, or jaw, or if you have sweating, nausea (feeling sick to your stomach), or shortness of breath. THIS IS AN EMERGENCY. Don't wait to see if the pain will go away. Get medical help at once. Call 911 or 0 (operator). DO NOT drive yourself to the hospital.   Your chest pain gets worse and does not go away with rest.   You have an attack of chest pain lasting longer than usual, despite rest and treatment with the medications your caregiver has prescribed.   You wake from sleep with chest pain or shortness of breath.  You feel dizzy or faint.  You have chest pain not typical of your usual pain for which you originally saw your caregiver.   You have any other emergent concerns regarding your health.  Additional Information: Chest pain comes from many different causes. Your caregiver has diagnosed you as having chest pain that is not specific for one problem, but does not require admission.  You are at low risk for an acute heart condition or other serious illness.   Your vital signs today were: BP 109/61 mmHg   Pulse 47   Temp(Src) 97.8 F (36.6 C) (Oral)   Resp 21   SpO2 97% If your blood pressure (BP) was elevated above 135/85 this visit, please have this repeated by your doctor within one  month. --------------

## 2015-08-25 ENCOUNTER — Encounter: Payer: Self-pay | Admitting: Cardiology

## 2015-08-25 ENCOUNTER — Telehealth: Payer: Self-pay | Admitting: Interventional Cardiology

## 2015-08-25 ENCOUNTER — Ambulatory Visit (INDEPENDENT_AMBULATORY_CARE_PROVIDER_SITE_OTHER): Payer: Medicare Other | Admitting: Cardiology

## 2015-08-25 VITALS — BP 110/76 | HR 76 | Ht 62.0 in | Wt 122.6 lb

## 2015-08-25 DIAGNOSIS — Z889 Allergy status to unspecified drugs, medicaments and biological substances status: Secondary | ICD-10-CM | POA: Diagnosis not present

## 2015-08-25 DIAGNOSIS — Z789 Other specified health status: Secondary | ICD-10-CM

## 2015-08-25 MED ORDER — DILTIAZEM HCL 60 MG PO TABS: 60.0000 mg | ORAL_TABLET | Freq: Two times a day (BID) | ORAL | Status: DC

## 2015-08-25 NOTE — Patient Instructions (Addendum)
Medication Instructions:  Your physician has recommended you make the following change in your medication:  1.  STOP Metoprolol 2.  START Cardizem 60 mg taking 1 tablet twice a day   Labwork: None ordered  Testing/Procedures: None ordered  Follow-Up: Your physician recommends that you schedule a follow-up appointment in: [place] RECHECK BP, PULSE, AND DO EKG    Any Other Special Instructions Will Be Listed Below (If Applicable).   If you need a refill on your cardiac medications before your next appointment, please call your pharmacy.

## 2015-08-25 NOTE — Progress Notes (Signed)
08/25/2015 [name]   [birth year]  [id number]  Primary Physician Henrine Screws, MD Primary Cardiologist: Dr. Irish Lack   Reason for Visit/CC: Medication Intolerance  HPI:  82 y.o. female with a hx of bronchiectasis, PACs and PAF.    Admitted 2/3-2/4 with AF with RVR.CHADS2-VASc= 3. She was placed on Eliquis 2.5 mg twice a day (age 35, wt 75 kg) for anticoagulation. She was placed on IV diltiazem for rate control. Beta blocker dose was increased. She converted spontaneously to NSR. She was discharged on higher dose of metoprolol 50 mg twice a day.   She presents to clinic today with her daughter with a complaint of intolerance to metoprolol. She states that roughly 1 hr after taking each dose she feels "funnyy/ wired up". She does not like the way she feels. She feels uncomfortable. This happens every time she takes it and she feels the metoprolol is the cause.  She is also unable to sleep. She denies any other changes to her diet. No excess caffeine or ETOH. No other medication changes. No n/v/d, fever or chills. She was seen in the ED yesterday with same complaint and w/u was normal. EKG showed NSR. Labs were normal.     Current Outpatient Prescriptions  Medication Sig Dispense Refill  . apixaban (ELIQUIS) 2.5 MG TABS tablet Take 1 tablet (2.5 mg total) by mouth 2 (two) times daily. 60 tablet 11  . Cholecalciferol (VITAMIN D) 2000 UNITS CAPS Take 2,000 Units by mouth daily.    . fluticasone-salmeterol (ADVAIR HFA) 115-21 MCG/ACT inhaler Inhale 2 puffs into the lungs 2 (two) times daily.    . Fluticasone-Salmeterol (ADVAIR) 250-50 MCG/DOSE AEPB Inhale 1 puff into the lungs every 12 (twelve) hours. 60 each 6  . metoprolol (LOPRESSOR) 50 MG tablet Take 50 mg by mouth 2 (two) times daily.  11  . Polyethyl Glycol-Propyl Glycol (SYSTANE OP) Place 1 drop into both eyes daily as needed (dry eyes).    . sertraline (ZOLOFT) 25 MG tablet Take 25 mg by mouth daily.    Marland Kitchen diltiazem  (CARDIZEM) 60 MG tablet Take 1 tablet (60 mg total) by mouth 2 (two) times daily. 120 tablet 0   No current facility-administered medications for this visit.    Allergies  Allergen Reactions  . Augmentin [Amoxicillin-Pot Clavulanate] Nausea And Vomiting  . Biaxin [Clarithromycin] Nausea And Vomiting  . Tequin [Gatifloxacin] Nausea Only    Social History   Social History  . Marital Status: Divorced    Spouse Name: N/A  . Number of Children: N/A  . Years of Education: N/A   Occupational History  . retired    Social History Main Topics  . Smoking status: Former Smoker -- 0.50 packs/day for 20 years    Types: Cigarettes    Quit date: 05/29/1988  . Smokeless tobacco: Never Used  . Alcohol Use: 0.0 oz/week    0 Standard drinks or equivalent per week     Comment: 1 can of beer at night  . Drug Use: No  . Sexual Activity: Not on file   Other Topics Concern  . Not on file   Social History Narrative     Review of Systems: General: negative for chills, fever, night sweats or weight changes.  Cardiovascular: negative for chest pain, dyspnea on exertion, edema, orthopnea, palpitations, paroxysmal nocturnal dyspnea or shortness of breath Dermatological: negative for rash Respiratory: negative for cough or wheezing Urologic: negative for hematuria Abdominal: negative for nausea, vomiting, diarrhea, bright red blood per  rectum, melena, or hematemesis Neurologic: negative for visual changes, syncope, or dizziness All other systems reviewed and are otherwise negative except as noted above.    Blood pressure 110/76, pulse 76, height 5\' 2"  (1.575 m), weight 122 lb 9.6 oz (55.611 kg), SpO2 96 %.  General appearance: alert, cooperative and no distress Neck: no carotid bruit and no JVD Lungs: clear to auscultation bilaterally Heart: regular rate and rhythm, S1, S2 normal, no murmur, click, rub or gallop Extremities: no LEE Pulses: 2+ and symmetric Skin: warm and dry Neurologic:  Grossly normal  EKG not performed. EKG 08/24/15 showed NSR. Physical exam reveals RRR.   ASSESSMENT AND PLAN:   1. Medication Intolerance: will switch from metoprolol to Cardizem for rate control given PAF. She responded well to IV Cardizem during recent admission and EF is normal. Will start 60 mg BID to see how she tolerates this. If able to tolerate, will consolidate dose to once daily. F/u in 1 week with pharmacy for f/u EKG, BP and pulse check.   2. PAF: NSR. Change rate control agent from BB to CCB due to reported intolerance to BB. Continue Eliquis for a/c. Patient denies abnormal bleeding. Recent CBC yesterday showed normal hgb. BMP showed normal renal function.   PLAN  Keep f/u with Dr. Irish Lack in May.   Lyda Jester PA-C 08/25/2015 4:56 PM

## 2015-08-25 NOTE — Telephone Encounter (Signed)
New message     Daughter calling wants mother to be seen today   Seen emergency room all day on yesterday C/O racing heart, sob, felt like she going to past out.  ER told to call the office the am.

## 2015-08-25 NOTE — Telephone Encounter (Signed)
Spoke with daughter and she states that pt was seen in the ED yesterday and was told to call us this morning. Daughter states when pt takes her Metoprolol she gets "wired up", SOB and "feels like she's going to die". Pt did not take medications prior to going to ED yesterday and vitals were good. Pt is not sleeping due to the wired up feeling. Pt was seen by Dr. Inda Merlin on 3/27 and changed her Lorazepam to Zoloft which pt did not start until last night. Spoke with Dr. Irish Lack and he said ok to add pt to Flex schedule. Spoke with daughter and scheduled pt with Lyda Jester, PA-C at 3:30pm today.

## 2015-08-26 ENCOUNTER — Telehealth: Payer: Self-pay | Admitting: Interventional Cardiology

## 2015-08-26 NOTE — Telephone Encounter (Signed)
New Message  Pt dtr requestd to speak w/ RN- was seen yesterday by [place] (3/29) stated that pt did not sleep again last night and has the same symptoms from the hospital. Pt dtr wanted to confirm if pt needs to start new medication today. Please call back and discuss.

## 2015-08-26 NOTE — Telephone Encounter (Signed)
**Note De-Identified  Obfuscation** Benjamine Mola, the pts daughter, is advised that the pt can start taking her Cardizem as advised at her OV yesterday with Ellen Henri, PA-c. Also, she is advised to contact the pts PCP to discuss the pts insomnia. Benjamine Mola verbalized understanding and thanked me for calling her back so soon.

## 2015-08-27 ENCOUNTER — Other Ambulatory Visit: Payer: Self-pay | Admitting: *Deleted

## 2015-08-27 ENCOUNTER — Telehealth: Payer: Self-pay | Admitting: Interventional Cardiology

## 2015-08-27 ENCOUNTER — Telehealth: Payer: Self-pay | Admitting: Physician Assistant

## 2015-08-27 MED ORDER — DILTIAZEM HCL 60 MG PO TABS: 60.0000 mg | ORAL_TABLET | Freq: Two times a day (BID) | ORAL | Status: DC

## 2015-08-27 MED ORDER — DILTIAZEM HCL ER COATED BEADS 120 MG PO CP24: 120.0000 mg | ORAL_CAPSULE | Freq: Every day | ORAL | Status: DC

## 2015-08-27 NOTE — Telephone Encounter (Signed)
This is the 4th night with no sleep, her heart is pounding so bad it wakes her up, very shaky, BP 128/64 78 sitting at 845am this am, SOB x 5 days, felt something "sliding down inside her body" at one point--pls advise dtr [facility] [phone]

## 2015-08-27 NOTE — Telephone Encounter (Addendum)
I spoke with The pts daughter, [name],  yesterday and she was reporting the same s/s on the pt.  This am [name] reports that the pts BP and HR are as follows: BP at 8:45 was 128/64  HR was 78 BP at 10:15 was 126/73  HR was 63 BP at 12:45 was 146/76  HR was 83  The pt had an OV with [name] on 3/29 and the following is her assessment and plan from that OV. ASSESSMENT AND PLAN:   1. Medication Intolerance: will switch from metoprolol to Cardizem for rate control given PAF. She responded well to IV Cardizem during recent admission and EF is normal. Will start 60 mg BID to see how she tolerates this. If able to tolerate, will consolidate dose to once daily. F/u in 1 week with pharmacy for f/u EKG, BP and pulse check.   2. PAF: NSR. Change rate control agent from BB to CCB due to reported intolerance to BB. Continue Eliquis for a/c. Patient denies abnormal bleeding. Recent CBC yesterday showed normal hgb. BMP showed normal renal function.   PLAN Keep f/u with Dr. Irish Lack in May.   Bethany Jester PA-C 08/25/2015 4:56 PM  The pt is scheduled to be seen in by Pharm D on 4/6 for f/u EKG, BP and HR check.  Please advise.

## 2015-08-27 NOTE — Telephone Encounter (Signed)
I changed the cardizem to 120mg  daily as written in previous phone note and spoke to the patient's daughter.  Tarri Fuller PAC

## 2015-08-27 NOTE — Telephone Encounter (Signed)
LMTCB

## 2015-08-27 NOTE — Telephone Encounter (Signed)
OK . Good HR and BP readings.  Can change to Cardizem CD 120 mg daily.

## 2015-08-28 NOTE — Telephone Encounter (Signed)
Daughter called. Her mother had some epigastric discomfort- she thinks its from Diltiazem (just started because she couldn't tolerate Lopressor). Her HR is running a little high-120 ish. I suggested her symptoms were not from the Diltiazem or Eliquis but from her HR being elevated. I suggested she take Diltiazem 60 mg this pm to see if this helps slow her down and relive her symptoms.  Kerin Ransom PA-C 08/28/2015 3:15 PM

## 2015-08-31 ENCOUNTER — Encounter (HOSPITAL_COMMUNITY)
Admission: RE | Disposition: A | Discharge status: Home / Self Care | Payer: Self-pay | Source: Ambulatory Visit | Attending: Cardiology

## 2015-08-31 ENCOUNTER — Encounter (HOSPITAL_COMMUNITY): Payer: Self-pay

## 2015-08-31 ENCOUNTER — Telehealth: Payer: Self-pay | Admitting: *Deleted

## 2015-08-31 ENCOUNTER — Ambulatory Visit (HOSPITAL_COMMUNITY): Payer: Medicare Other | Admitting: Anesthesiology

## 2015-08-31 ENCOUNTER — Encounter: Payer: Self-pay | Admitting: Physician Assistant

## 2015-08-31 ENCOUNTER — Ambulatory Visit (INDEPENDENT_AMBULATORY_CARE_PROVIDER_SITE_OTHER): Payer: Medicare Other | Admitting: Physician Assistant

## 2015-08-31 ENCOUNTER — Telehealth: Payer: Self-pay | Admitting: Interventional Cardiology

## 2015-08-31 ENCOUNTER — Ambulatory Visit (HOSPITAL_COMMUNITY)
Admission: RE | Admit: 2015-08-31 | Discharge: 2015-08-31 | Disposition: A | Discharge status: Home / Self Care | Payer: Medicare Other | Source: Ambulatory Visit | Attending: Cardiology | Admitting: Cardiology

## 2015-08-31 VITALS — BP 140/68 | HR 126 | Ht 62.0 in | Wt 121.6 lb

## 2015-08-31 DIAGNOSIS — Z7901 Long term (current) use of anticoagulants: Secondary | ICD-10-CM | POA: Insufficient documentation

## 2015-08-31 DIAGNOSIS — I4891 Unspecified atrial fibrillation: Secondary | ICD-10-CM | POA: Diagnosis not present

## 2015-08-31 DIAGNOSIS — J449 Chronic obstructive pulmonary disease, unspecified: Secondary | ICD-10-CM | POA: Diagnosis not present

## 2015-08-31 DIAGNOSIS — Z7951 Long term (current) use of inhaled steroids: Secondary | ICD-10-CM | POA: Diagnosis not present

## 2015-08-31 DIAGNOSIS — Z79899 Other long term (current) drug therapy: Secondary | ICD-10-CM | POA: Diagnosis not present

## 2015-08-31 DIAGNOSIS — I481 Persistent atrial fibrillation: Secondary | ICD-10-CM | POA: Insufficient documentation

## 2015-08-31 DIAGNOSIS — Z87891 Personal history of nicotine dependence: Secondary | ICD-10-CM | POA: Insufficient documentation

## 2015-08-31 DIAGNOSIS — I739 Peripheral vascular disease, unspecified: Secondary | ICD-10-CM | POA: Diagnosis not present

## 2015-08-31 HISTORY — PX: CARDIOVERSION: SHX1299

## 2015-08-31 SURGERY — CARDIOVERSION
Anesthesia: Monitor Anesthesia Care

## 2015-08-31 MED ORDER — SODIUM CHLORIDE 0.9% FLUSH: 3.0000 mL | Freq: Two times a day (BID) | INTRAVENOUS | Status: DC

## 2015-08-31 MED ORDER — PROPOFOL 10 MG/ML IV BOLUS
INTRAVENOUS | Status: DC | PRN
  Administered 2015-08-31: 20 mg via INTRAVENOUS
  Administered 2015-08-31: 50 mg via INTRAVENOUS

## 2015-08-31 MED ORDER — FLECAINIDE ACETATE 50 MG PO TABS: 50.0000 mg | ORAL_TABLET | Freq: Two times a day (BID) | ORAL | Status: DC

## 2015-08-31 MED ORDER — SODIUM CHLORIDE 0.9% FLUSH: 3.0000 mL | INTRAVENOUS | Status: DC | PRN

## 2015-08-31 MED ORDER — SODIUM CHLORIDE 0.9 % IV SOLN
250.0000 mL | INTRAVENOUS | Status: DC
  Administered 2015-08-31: 13:00:00 via INTRAVENOUS

## 2015-08-31 MED ORDER — LIDOCAINE HCL (CARDIAC) 20 MG/ML IV SOLN
INTRAVENOUS | Status: DC | PRN
  Administered 2015-08-31: 40 mg via INTRAVENOUS

## 2015-08-31 NOTE — Progress Notes (Signed)
Cardiology Office Note:    Date:  08/31/2015   ID:  [name], DOB [birth year], MRN [id number]  PCP:  Henrine Screws, MD  Cardiologist:  Dr. Casandra Doffing   Electrophysiologist:  n/a  Chief Complaint  Patient presents with  . Palpitations    History of Present Illness:     [name] is a 82 y.o. female with a hx of bronchiectasis, PACs.    Admitted 2/17 with AF with RVR.CHADS2-VASc= 3. She was placed on Eliquis 2.5 mg twice a day (age 63, wt 61 kg) for anticoagulation. She was placed on IV diltiazem for rate control. Beta blocker dose was increased. She converted spontaneously to NSR. She was discharged on higher dose of metoprolol 50 mg twice a day.  Last seen in this office by Lyda Jester, PA-C 3/29.  She c/o SEs to beta-blocker.  Her beta-blocker was changed to short acting Dilt 60 bid.  She had more palpitations and was started on Dilt CD 120 in AM and short acting 60 in PM.  This helped for a few days.  But, she started having symptoms again.  She then called in today with c/o palpitations. She was added on for evaluation.  Here with her daughter.  She feels a rapid HR. This has been going on for days.  She has chronic dyspnea related to bronchiectasis.  This may be somewhat worse.  She feels tight in the chest with her rapid HR.  Denies syncope. She has been dizzy.  Denies orthopnea, PND, edema.  Elevated HR has woken her from sleep.    Past Medical History  Diagnosis Date  . DJD (degenerative joint disease) of knee   . Bronchiectasis ([place])     with multiple pneumonia back in 1990's  . Bronchitis     episodic  . PVC's (premature ventricular contractions)     on EKG, controlled on atenolol and caffeine reduction  . History of nuclear stress test 11/07    NORMAL  . GERD (gastroesophageal reflux disease)     episodic symptoms  . Hearing loss     chronic moderate hearing loss, worked up by ENT and hearing aids have been recommended  . Tinnitus     chronic   . Insomnia     Past Surgical History  Procedure Laterality Date  . Cyst removed      left breast    Current Medications:  No facility-administered encounter medications on file as of 08/31/2015.   Outpatient Encounter Prescriptions as of 08/31/2015  Medication Sig Note  . apixaban (ELIQUIS) 2.5 MG TABS tablet Take 1 tablet (2.5 mg total) by mouth 2 (two) times daily.   . Cholecalciferol (VITAMIN D) 2000 UNITS CAPS Take 2,000 Units by mouth daily.   Marland Kitchen diltiazem (CARDIZEM CD) 120 MG 24 hr capsule Take 1 capsule (120 mg total) by mouth daily.   . fluticasone-salmeterol (ADVAIR HFA) 115-21 MCG/ACT inhaler Inhale 2 puffs into the lungs 2 (two) times daily as needed (WHEEZING AND SHORTNESS OF BREATH).    Vladimir Faster Glycol-Propyl Glycol (SYSTANE OP) Place 1 drop into both eyes daily as needed (dry eyes).   . sertraline (ZOLOFT) 25 MG tablet Take 25 mg by mouth daily.   . [DISCONTINUED] diltiazem (CARDIZEM) 60 MG tablet Take 60 mg by mouth at bedtime.   . flecainide (TAMBOCOR) 50 MG tablet Take 1 tablet (50 mg total) by mouth 2 (two) times daily.   . [DISCONTINUED] Fluticasone-Salmeterol (ADVAIR) 250-50 MCG/DOSE AEPB Inhale 1 puff  into the lungs every 12 (twelve) hours. (Patient not taking: Reported on 08/31/2015) 07/02/2015: Pt will start taking this in about a month after completing current Advair 115-21 inhaler  . [DISCONTINUED] metoprolol (LOPRESSOR) 50 MG tablet Take 50 mg by mouth 2 (two) times daily. Reported on 08/31/2015 08/24/2015: Received from: External Pharmacy Received Sig:      Allergies:   Augmentin; Biaxin; Metoprolol; and Tequin   Social History   Social History  . Marital Status: Divorced    Spouse Name: N/A  . Number of Children: N/A  . Years of Education: N/A   Occupational History  . retired    Social History Main Topics  . Smoking status: Former Smoker -- 0.50 packs/day for 20 years    Types: Cigarettes    Quit date: 05/29/1988  . Smokeless tobacco: Never Used  .  Alcohol Use: 0.0 oz/week    0 Standard drinks or equivalent per week     Comment: 1 can of beer at night  . Drug Use: No  . Sexual Activity: Not Asked   Other Topics Concern  . None   Social History Narrative     Family History:  The patient's family history includes Asthma in her sister; Clotting disorder in her sister; Hypertension in her father and mother; Stroke in her father and mother.   ROS:   Please see the history of present illness.    Review of Systems  HENT: Positive for headaches.   Cardiovascular: Positive for dyspnea on exertion and irregular heartbeat.  Respiratory: Positive for shortness of breath.   Psychiatric/Behavioral: The patient is nervous/anxious.   All other systems reviewed and are negative.   Physical Exam:    VS:  BP 140/68 mmHg  Pulse 126  Ht 5\' 2"  (1.575 m)  Wt 121 lb 9.6 oz (55.157 kg)  BMI 22.24 kg/m2  SpO2 97%   GEN: Well nourished, well developed, in no acute distress HEENT: normal Neck: no JVD, no masses Cardiac: Normal S1/S2, irreg irreg rhythm; no murmurs, no edema;  Respiratory:  clear to auscultation bilaterally; no wheezing, rhonchi or rales GI: soft, nontender, nondistended, + BS MS: no deformity or atrophy Skin: warm and dry, no rash Neuro:  no focal deficits  Psych: Alert and oriented x 3, normal affect  Wt Readings from Last 3 Encounters:  08/31/15 121 lb (54.885 kg)  08/31/15 121 lb 9.6 oz (55.157 kg)  08/25/15 122 lb 9.6 oz (55.611 kg)      Studies/Labs Reviewed:     EKG:  EKG is  ordered today.  The ekg ordered today demonstrates AF with RVR, HR 137   Recent Labs: 07/02/2015: TSH 2.443 07/03/2015: Magnesium 2.1 08/24/2015: BUN 10; Creatinine, Ser 0.71; Hemoglobin 14.3; Platelets 171; Potassium 4.2; Sodium 143   Recent Lipid Panel    Component Value Date/Time   CHOL 163 07/03/2015 0440   TRIG 73 07/03/2015 0440   HDL 57 07/03/2015 0440   CHOLHDL 2.9 07/03/2015 0440   VLDL 15 07/03/2015 0440   LDLCALC 91  07/03/2015 0440    Additional studies/ records that were reviewed today include:   Echo 07/03/15 EF 55-60%, normal wall motion  ETT 3/14 Negative   LHC 10/10 Normal coronary arteries EF 60%  Myoview 10/10 EF 80%, minimal attenuation artifact in anterior region of myocardium, small reversible defect in the apex  ASSESSMENT:     1. Atrial fibrillation with RVR ([place])   2. COPD mixed type ([place])     PLAN:  In order of problems listed above:  1. PAF - She is back in AF with RVR. She has intol to beta-blocker.  Now on calcium channel blocker and HR remains too high.  She has been on Eliquis without interruption since 06/2015.  Last documented NSR was 3/28.  She needs AAD therapy (rhythm control) to maintain NSR as she has rapid AF and is symptomatic. She is not likely a candidate for Amiodarone due to lung disease.  QTc may be borderline for Tikosyn. She had a normal cath in 2010.  I think she is a good candidate for Flecainide.  I reviewed this with Dr. Liam Rogers (DOD) who agreed.  -  Arrange DCCV this afternoon (she is NPO).  -  Get CBC, BMET at short stay  -  Change Dil tiazem to CD 120 mg QD  -  Start Flecainide 50 mg bid  -  ETT in 1 week  -  FU with me or Dr. Casandra Doffing in 2 weeks.   -  Continue Eliquis.   2. COPD - FU with Pulmonology as planned.      Medication Adjustments/Labs and Tests Ordered: Current medicines are reviewed at length with the patient today.  Concerns regarding medicines are outlined above.  Medication changes, Labs and Tests ordered today are outlined in the Patient Instructions noted below. Patient Instructions  Medication Instructions:  1. START FLECAINIDE 50 MG TWICE DAILY; RX SENT IN 2. CONTINUE ON THE CARDIZEM CD 120 MG DADILY  Labwork: TODAY BMET, CBC W/DIFF  Testing/Procedures: 1. Your physician has requested that you have an exercise tolerance test. For further information please visit HugeFiesta.[email or web address]. Please also follow  instruction sheet, as given.  2. Your physician has recommended that you have a Cardioversion (DCCV). Electrical Cardioversion uses a jolt of electricity to your heart either through paddles or wired patches attached to your chest. This is a controlled, usually prescheduled, procedure. Defibrillation is done under light anesthesia in the hospital, and you usually go home the day of the procedure. This is done to get your heart back into a normal rhythm. You are not awake for the procedure. Please see the instruction sheet given to you today.  Follow-Up: DR. VARANASI /Zalika Tieszen, PAC 2 WEEKS   Any Other Special Instructions Will Be Listed Below (If Applicable).  If you need a refill on your cardiac medications before your next appointment, please call your pharmacy.    Signed, Richardson Dopp, PA-C  08/31/2015 1:03 PM    [facility] [place], [place], [place]  [address] Phone: [phone]; Fax: [phone]    ADDENDUM 08/31/2015 3:33 PM  Received call from Dr. Candee Furbish.  Patient was DCCV x total of 4 without return to NSR. Will let patient start Flecainide tonight. FU in several days. If still in AFib, arrange repeat DCCV on Flecainide. After DCCV, will need FU ETT to r/o pro-arrhythmia on Flecainide. Richardson Dopp, PA-C   08/31/2015 3:35 PM    Attending Note:   The patient was seen and examined.  Agree with assessment and plan as noted above.  Changes made to the above note as needed.  Agreed with plan for cardioversion today . Unfortunately , she did not convert to NSR Starting Flecainide tonight  Will try cardioversion again next week  If she does not convert to NSR   Thayer Headings, Brooke Bonito., MD, [facility] 08/31/2015, 5:41 PM [address]  [place] Pager [phone]    

## 2015-08-31 NOTE — CV Procedure (Signed)
    Electrical Cardioversion Procedure Note [name] [id number] 1931/05/12  Procedure: Electrical Cardioversion Indications:  Atrial Fibrillation  Time Out: Verified patient identification, verified procedure,medications/allergies/relevent history reviewed, required imaging and test results available.  Performed  Procedure Details  The patient was NPO after midnight. Anesthesia was administered at the beside  by anesthesia with propofol.  Cardioversion was performed with synchronized biphasic defibrillation via AP pads with 120, 150, 200, 120 joules.  4 attempt(s) were performed.  The patient converted to normal sinus rhythm. The patient tolerated the procedure well   IMPRESSION:  Failed cardioversion Briefly converted to NSR with frequent PAC's after first shock. The next 3 shocks were followed with persistent AFIB.  Just starting Flecainide 50 tonight and continuing Dilt 120 QD  Eliquis 2.5    [name] 08/31/2015, 1:58 PM

## 2015-08-31 NOTE — Telephone Encounter (Signed)
New Message  Per pt dtr- pt c/o heart racing, fatigue, weakness, lack of sleep and wants to be seen ASAP. Please call back and discuss.

## 2015-08-31 NOTE — Patient Instructions (Addendum)
Medication Instructions:  1. START FLECAINIDE 50 MG TWICE DAILY; RX SENT IN 2. CONTINUE ON THE CARDIZEM CD 120 MG DADILY  Labwork: TODAY BMET, CBC W/DIFF  Testing/Procedures: 1. Your physician has requested that you have an exercise tolerance test. For further information please visit HugeFiesta.[email or web address]. Please also follow instruction sheet, as given.  2. Your physician has recommended that you have a Cardioversion (DCCV). Electrical Cardioversion uses a jolt of electricity to your heart either through paddles or wired patches attached to your chest. This is a controlled, usually prescheduled, procedure. Defibrillation is done under light anesthesia in the hospital, and you usually go home the day of the procedure. This is done to get your heart back into a normal rhythm. You are not awake for the procedure. Please see the instruction sheet given to you today.  Follow-Up: DR. VARANASI /SCOTT WEAVER, PAC 2 WEEKS   Any Other Special Instructions Will Be Listed Below (If Applicable).  If you need a refill on your cardiac medications before your next appointment, please call your pharmacy.

## 2015-08-31 NOTE — Anesthesia Postprocedure Evaluation (Signed)
Anesthesia Post Note  Patient: [name]  Procedure(s) Performed: Procedure(s) (LRB): CARDIOVERSION (N/A)  Patient location during evaluation: Endoscopy Anesthesia Type: MAC Level of consciousness: awake, responds to stimulation and patient cooperative Pain management: pain level controlled Vital Signs Assessment: post-procedure vital signs reviewed and stable Respiratory status: spontaneous breathing Cardiovascular status: blood pressure returned to baseline Postop Assessment: no headache Anesthetic complications: no    Last Vitals:  Filed Vitals:   08/31/15 1256  BP: 136/65  Pulse: 135  Resp: 26    Last Pain: There were no vitals filed for this visit.               Judeth Cornfield T

## 2015-08-31 NOTE — Anesthesia Preprocedure Evaluation (Addendum)
Anesthesia Evaluation  Patient identified by MRN, date of birth, ID band Patient awake    Reviewed: Allergy & Precautions, NPO status , Patient's Chart, lab work & pertinent test results  Airway Mallampati: II  TM Distance: >3 FB Neck ROM: Full    Dental  (+) Teeth Intact   Pulmonary COPD, former smoker,    breath sounds clear to auscultation       Cardiovascular + Peripheral Vascular Disease and + DOE  + dysrhythmias Atrial Fibrillation  Rhythm:Irregular Rate:Tachycardia     Neuro/Psych negative neurological ROS  negative psych ROS   GI/Hepatic Neg liver ROS, GERD  ,  Endo/Other  negative endocrine ROS  Renal/GU negative Renal ROS  negative genitourinary   Musculoskeletal  (+) Arthritis ,   Abdominal   Peds negative pediatric ROS (+)  Hematology negative hematology ROS (+)   Anesthesia Other Findings   Reproductive/Obstetrics negative OB ROS                            Lab Results  Component Value Date   WBC 5.8 08/24/2015   HGB 14.3 08/24/2015   HCT 45.0 08/24/2015   MCV 90.0 08/24/2015   PLT 171 08/24/2015   Lab Results  Component Value Date   CREATININE 0.71 08/24/2015   BUN 10 08/24/2015   NA 143 08/24/2015   K 4.2 08/24/2015   CL 107 08/24/2015   CO2 27 08/24/2015   No results found for: INR, PROTIME  06/2015 Echo Study Conclusions  - Left ventricle: The cavity size was normal. Systolic function was normal. The estimated ejection fraction was in the range of 55% to 60%. Wall motion was normal; there were no regional wall motion abnormalities.     Anesthesia Physical Anesthesia Plan  ASA: III  Anesthesia Plan: MAC   Post-op Pain Management:    Induction: Intravenous  Airway Management Planned: Simple Face Mask and Natural Airway  Additional Equipment:   Intra-op Plan:   Post-operative Plan:   Informed Consent: I have reviewed the patients History  and Physical, chart, labs and discussed the procedure including the risks, benefits and alternatives for the proposed anesthesia with the patient or authorized representative who has indicated his/her understanding and acceptance.     Plan Discussed with: CRNA  Anesthesia Plan Comments:         Anesthesia Quick Evaluation

## 2015-08-31 NOTE — Telephone Encounter (Signed)
Called patient's daughter about patient's symptoms. Patient complaining of SOB, decreased appetite, and insomnia. Daughter states that patient's insomnia is due to pounding/racing/skipping heart beats. Patient's Cardizem was recently changed to 120 mg in the morning and 60 mg at night. This morning patient's BP 119/67 HR 68. Daughter reports that heart rate has been in 60-80's. Will consult Richardson Dopp PA/ FLEX.  Richardson Dopp PA advised patient to come in for an appointment today. Called patient's daughter and scheduled patient for 11:00 today.

## 2015-08-31 NOTE — Interval H&P Note (Signed)
History and Physical Interval Note:  08/31/2015 1:10 PM  [name]  has presented today for surgery, with the diagnosis of afib  The various methods of treatment have been discussed with the patient and family. After consideration of risks, benefits and other options for treatment, the patient has consented to  Procedure(s): CARDIOVERSION (N/A) as a surgical intervention .  The patient's history has been reviewed, patient examined, no change in status, stable for surgery.  I have reviewed the patient's chart and labs.  Questions were answered to the patient's satisfaction.     [name]

## 2015-08-31 NOTE — Discharge Instructions (Signed)
Monitored Anesthesia Care °Monitored anesthesia care is an anesthesia service for a medical procedure. Anesthesia is the loss of the ability to feel pain. It is produced by medicines called anesthetics. It may affect a small area of your body (local anesthesia), a large area of your body (regional anesthesia), or your entire body (general anesthesia). The need for monitored anesthesia care depends your procedure, your condition, and the potential need for regional or general anesthesia. It is often provided during procedures where:  °· General anesthesia may be needed if there are complications. This is because you need special care when you are under general anesthesia.   °· You will be under local or regional anesthesia. This is so that you are able to have higher levels of anesthesia if needed.   °· You will receive calming medicines (sedatives). This is especially the case if sedatives are given to put you in a semi-conscious state of relaxation (deep sedation). This is because the amount of sedative needed to produce this state can be hard to predict. Too much of a sedative can produce general anesthesia. °Monitored anesthesia care is performed by one or more health care providers who have special training in all types of anesthesia. You will need to meet with these health care providers before your procedure. During this meeting, they will ask you about your medical history. They will also give you instructions to follow. (For example, you will need to stop eating and drinking before your procedure. You may also need to stop or change medicines you are taking.) During your procedure, your health care providers will stay with you. They will:  °· Watch your condition. This includes watching your blood pressure, breathing, and level of pain.   °· Diagnose and treat problems that occur.   °· Give medicines if they are needed. These may include calming medicines (sedatives) and anesthetics.   °· Make sure you are  comfortable.   °Having monitored anesthesia care does not necessarily mean that you will be under anesthesia. It does mean that your health care providers will be able to manage anesthesia if you need it or if it occurs. It also means that you will be able to have a different type of anesthesia than you are having if you need it. When your procedure is complete, your health care providers will continue to watch your condition. They will make sure any medicines wear off before you are allowed to go home.  °  °This information is not intended to replace advice given to you by your health care provider. Make sure you discuss any questions you have with your health care provider. °  °Document Released: 02/08/2005 Document Revised: 06/05/2014 Document Reviewed: 06/26/2012 °Elsevier Interactive Patient Education ©2016 [employer] °Electrical Cardioversion, Care After °Refer to this sheet in the next few weeks. These instructions provide you with information on caring for yourself after your procedure. Your health care provider may also give you more specific instructions. Your treatment has been planned according to current medical practices, but problems sometimes occur. Call your health care provider if you have any problems or questions after your procedure. °WHAT TO EXPECT AFTER THE PROCEDURE °After your procedure, it is typical to have the following sensations: °· Some redness on the skin where the shocks were delivered. If this is tender, a sunburn lotion or hydrocortisone cream may help. °· Possible return of an abnormal heart rhythm within hours or days after the procedure. °HOME CARE INSTRUCTIONS °· Take medicines only as directed by your health care provider.   Be sure you understand how and when to take your medicine. °· Learn how to feel your pulse and check it often. °· Limit your activity for 48 hours after the procedure or as directed by your health care provider. °· Avoid or minimize caffeine and other  stimulants as directed by your health care provider. °SEEK MEDICAL CARE IF: °· You feel like your heart is beating too fast or your pulse is not regular. °· You have any questions about your medicines. °· You have bleeding that will not stop. °SEEK IMMEDIATE MEDICAL CARE IF: °· You are dizzy or feel faint. °· It is hard to breathe or you feel short of breath. °· There is a change in discomfort in your chest. °· Your speech is slurred or you have trouble moving an arm or leg on one side of your body. °· You get a serious muscle cramp that does not go away. °· Your fingers or toes turn cold or blue. °  °This information is not intended to replace advice given to you by your health care provider. Make sure you discuss any questions you have with your health care provider. °  °Document Released: 03/05/2013 Document Revised: 06/05/2014 Document Reviewed: 03/05/2013 °Elsevier Interactive Patient Education ©2016 Elsevier Inc. ° °

## 2015-08-31 NOTE — H&P (View-Only) (Signed)
08/25/2015 [name]   [birth year]  [id number]  Primary Physician Henrine Screws, MD Primary Cardiologist: Dr. Irish Lack   Reason for Visit/CC: Medication Intolerance  HPI:  82 y.o. female with a hx of bronchiectasis, PACs and PAF.    Admitted 2/3-2/4 with AF with RVR.CHADS2-VASc= 3. She was placed on Eliquis 2.5 mg twice a day (age 32, wt 80 kg) for anticoagulation. She was placed on IV diltiazem for rate control. Beta blocker dose was increased. She converted spontaneously to NSR. She was discharged on higher dose of metoprolol 50 mg twice a day.   She presents to clinic today with her daughter with a complaint of intolerance to metoprolol. She states that roughly 1 hr after taking each dose she feels "funnyy/ wired up". She does not like the way she feels. She feels uncomfortable. This happens every time she takes it and she feels the metoprolol is the cause.  She is also unable to sleep. She denies any other changes to her diet. No excess caffeine or ETOH. No other medication changes. No n/v/d, fever or chills. She was seen in the ED yesterday with same complaint and w/u was normal. EKG showed NSR. Labs were normal.     Current Outpatient Prescriptions  Medication Sig Dispense Refill  . apixaban (ELIQUIS) 2.5 MG TABS tablet Take 1 tablet (2.5 mg total) by mouth 2 (two) times daily. 60 tablet 11  . Cholecalciferol (VITAMIN D) 2000 UNITS CAPS Take 2,000 Units by mouth daily.    . fluticasone-salmeterol (ADVAIR HFA) 115-21 MCG/ACT inhaler Inhale 2 puffs into the lungs 2 (two) times daily.    . Fluticasone-Salmeterol (ADVAIR) 250-50 MCG/DOSE AEPB Inhale 1 puff into the lungs every 12 (twelve) hours. 60 each 6  . metoprolol (LOPRESSOR) 50 MG tablet Take 50 mg by mouth 2 (two) times daily.  11  . Polyethyl Glycol-Propyl Glycol (SYSTANE OP) Place 1 drop into both eyes daily as needed (dry eyes).    . sertraline (ZOLOFT) 25 MG tablet Take 25 mg by mouth daily.    Marland Kitchen diltiazem  (CARDIZEM) 60 MG tablet Take 1 tablet (60 mg total) by mouth 2 (two) times daily. 120 tablet 0   No current facility-administered medications for this visit.    Allergies  Allergen Reactions  . Augmentin [Amoxicillin-Pot Clavulanate] Nausea And Vomiting  . Biaxin [Clarithromycin] Nausea And Vomiting  . Tequin [Gatifloxacin] Nausea Only    Social History   Social History  . Marital Status: Divorced    Spouse Name: N/A  . Number of Children: N/A  . Years of Education: N/A   Occupational History  . retired    Social History Main Topics  . Smoking status: Former Smoker -- 0.50 packs/day for 20 years    Types: Cigarettes    Quit date: 05/29/1988  . Smokeless tobacco: Never Used  . Alcohol Use: 0.0 oz/week    0 Standard drinks or equivalent per week     Comment: 1 can of beer at night  . Drug Use: No  . Sexual Activity: Not on file   Other Topics Concern  . Not on file   Social History Narrative     Review of Systems: General: negative for chills, fever, night sweats or weight changes.  Cardiovascular: negative for chest pain, dyspnea on exertion, edema, orthopnea, palpitations, paroxysmal nocturnal dyspnea or shortness of breath Dermatological: negative for rash Respiratory: negative for cough or wheezing Urologic: negative for hematuria Abdominal: negative for nausea, vomiting, diarrhea, bright red blood per  rectum, melena, or hematemesis Neurologic: negative for visual changes, syncope, or dizziness All other systems reviewed and are otherwise negative except as noted above.    Blood pressure 110/76, pulse 76, height 5\' 2"  (1.575 m), weight 122 lb 9.6 oz (55.611 kg), SpO2 96 %.  General appearance: alert, cooperative and no distress Neck: no carotid bruit and no JVD Lungs: clear to auscultation bilaterally Heart: regular rate and rhythm, S1, S2 normal, no murmur, click, rub or gallop Extremities: no LEE Pulses: 2+ and symmetric Skin: warm and dry Neurologic:  Grossly normal  EKG not performed. EKG 08/24/15 showed NSR. Physical exam reveals RRR.   ASSESSMENT AND PLAN:   1. Medication Intolerance: will switch from metoprolol to Cardizem for rate control given PAF. She responded well to IV Cardizem during recent admission and EF is normal. Will start 60 mg BID to see how she tolerates this. If able to tolerate, will consolidate dose to once daily. F/u in 1 week with pharmacy for f/u EKG, BP and pulse check.   2. PAF: NSR. Change rate control agent from BB to CCB due to reported intolerance to BB. Continue Eliquis for a/c. Patient denies abnormal bleeding. Recent CBC yesterday showed normal hgb. BMP showed normal renal function.   PLAN  Keep f/u with Dr. Irish Lack in May.   Lyda Jester PA-C 08/25/2015 4:56 PM

## 2015-08-31 NOTE — Telephone Encounter (Signed)
See my OV note from today. Richardson Dopp, PA-C   08/31/2015 5:53 PM

## 2015-08-31 NOTE — Telephone Encounter (Signed)
DPR for pt's daughter [name] who has been notified since DCCV unscuccessful today per PA and DOD pt needs to be seen next week. Per [facility] early next week. Pt has been scheduled with Ellen Henri, PA 2 pm 4/10. This will give pt a few days to be on Flecainide 50 mg BID that was started today at ov by Brynda Rim. PA. If pt still out of rhythm 4/10 after being on Flecainide for a few days we may attempt DCCV again. Both daughter and pt agreeable to plan of care.

## 2015-08-31 NOTE — Transfer of Care (Signed)
Immediate Anesthesia Transfer of Care Note  Patient: [name]  Procedure(s) Performed: Procedure(s): CARDIOVERSION (N/A)  Patient Location: PACU and Endoscopy Unit  Anesthesia Type:MAC  Level of Consciousness: awake, patient cooperative and responds to stimulation  Airway & Oxygen Therapy: Patient Spontanous Breathing  Post-op Assessment: Report given to RN and Post -op Vital signs reviewed and stable  Post vital signs: Reviewed and stable  Last Vitals:  Filed Vitals:   08/31/15 1256  BP: 136/65  Pulse: 135  Resp: 26    Complications: No apparent anesthesia complications

## 2015-09-02 ENCOUNTER — Ambulatory Visit: Payer: Medicare Other | Admitting: Pharmacist

## 2015-09-02 ENCOUNTER — Telehealth: Payer: Self-pay | Admitting: Interventional Cardiology

## 2015-09-02 NOTE — Telephone Encounter (Signed)
New message  Pt c/o medication issue: 1. Name of Medication:  diltiazem (CARDIZEM CD) 120 MG 24 hr capsule flecainide (TAMBOCOR) 50 MG tablet flecainide (TAMBOCOR) 50 MG tablet   4. What is your medication issue? Heart pounding, Shaking and no rest. Not sure if it is coming from these medications

## 2015-09-02 NOTE — Telephone Encounter (Signed)
The pt c/o her heart pounding, shaking all over, and she states that she cannot sleep. She wants to know what to do.  She is advised that I will discuss with our Flex, Cecilie Kicks, NP or our DOD, Dr Angelena Form and that I will call her back.

## 2015-09-02 NOTE — Telephone Encounter (Signed)
Per Cecilie Kicks, NP (Flex) the pt has been scheduled to see Lyda Jester, PA-c tomorrow at 11:30. The pt is aware that her apt with Lyda Jester, PA-c that was scheduled for 09/06/15 at 2 pm has been canceled and replaced with apt tomorrow. She is in agreement with the plan.

## 2015-09-03 ENCOUNTER — Encounter: Payer: Self-pay | Admitting: Cardiology

## 2015-09-03 ENCOUNTER — Telehealth: Payer: Self-pay | Admitting: Cardiology

## 2015-09-03 ENCOUNTER — Ambulatory Visit (INDEPENDENT_AMBULATORY_CARE_PROVIDER_SITE_OTHER): Payer: Medicare Other | Admitting: Cardiology

## 2015-09-03 VITALS — BP 140/70 | HR 90 | Ht 62.0 in | Wt 121.0 lb

## 2015-09-03 DIAGNOSIS — R002 Palpitations: Secondary | ICD-10-CM

## 2015-09-03 DIAGNOSIS — Z5181 Encounter for therapeutic drug level monitoring: Secondary | ICD-10-CM

## 2015-09-03 DIAGNOSIS — I4891 Unspecified atrial fibrillation: Secondary | ICD-10-CM | POA: Diagnosis not present

## 2015-09-03 DIAGNOSIS — R030 Elevated blood-pressure reading, without diagnosis of hypertension: Secondary | ICD-10-CM

## 2015-09-03 DIAGNOSIS — IMO0001 Reserved for inherently not codable concepts without codable children: Secondary | ICD-10-CM

## 2015-09-03 DIAGNOSIS — Z79899 Other long term (current) drug therapy: Secondary | ICD-10-CM

## 2015-09-03 NOTE — Patient Instructions (Addendum)
Medication Instructions:  Your physician recommends that you continue on your current medications as directed. Please refer to the Current Medication list given to you today.   Labwork: None ordered  Testing/Procedures: Your physician has requested that you have an exercise tolerance test. For further information please visit [email or web address]. Please also follow instruction sheet, as given.   Follow-Up: Your physician recommends that you schedule a follow-up appointment in: [place]   Any Other Special Instructions Will Be Listed Below (If Applicable).     If you need a refill on your cardiac medications before your next appointment, please call your pharmacy.

## 2015-09-03 NOTE — Telephone Encounter (Signed)
Calling stating she noticed when she had a bowel movement today and when she wiped she had bright red blood.  States the stool was a dark brown not black tarry.  She admits to being constipated and possibly having hemorrhoids. States she has a rectal supp and wanted to know if she could use the suppository.  She started on Eliquis on 07/03/15 and was afraid that was causing the bleeding.  Advised that she needs to stay on the Eliquis.  Recommended that if the bleeding occurs again or has stomach cramping she should go to urgent care since it is the weekend to have some blood drawn to check her Hbg.  Advised to take MOM for her constipation or something that will help her have a bowel movement. She verbalizes understanding and will go to urgent care if necessary.

## 2015-09-03 NOTE — Telephone Encounter (Signed)
Pt taking Eliquis for several weeks, just now saw blood in stool--pl advise [phone]

## 2015-09-03 NOTE — Progress Notes (Signed)
09/03/2015 [name]   14-Oct-1930  [id number]  Primary Physician Henrine Screws, MD Primary Cardiologist: Dr. Irish Lack   Reason for Visit/CC: Atrial Fibrillation  HPI:  82 year old female with a history of paroxysmal atrial fibrillation and elevated CHA2DS2 VASc score of 3, on chronic anticoagulation with Eliquis, who presents to clinic for post procedural follow-up. She recently presented to [facility] for elective direct-current cardioversion. This was performed by Dr. Marlou Porch on 08/31/2015. It was unsuccessful. Per procedure report, she briefly converted to normal sinus rhythm with frequent PACs after first shock. The next 3 shocks were followed with persistent A. fib. Subsequently, she was started on flecainide 50 mg BID and was instructed to continue diltiazem 120 mg daily. She was also advised to resume Eliquis twice a day. She presents to clinic for follow-up.  EKG today in clinic shows normal sinus rhythm. Heart rate 71 bpm. Blood pressure is stable. She reports full medication compliance with flecainide along with diltiazem. She also notes full medication compliance with Eliquis. She denies any abnormal bleeding. No melena and no falls. She still has a lot of anxiety, especially at night. This is managed by her PCP Dr. Inda Merlin.   Current Outpatient Prescriptions  Medication Sig Dispense Refill  . apixaban (ELIQUIS) 2.5 MG TABS tablet Take 1 tablet (2.5 mg total) by mouth 2 (two) times daily. 60 tablet 11  . Cholecalciferol (VITAMIN D) 2000 UNITS CAPS Take 2,000 Units by mouth daily.    Marland Kitchen diltiazem (CARDIZEM CD) 120 MG 24 hr capsule Take 1 capsule (120 mg total) by mouth daily. 30 capsule 5  . flecainide (TAMBOCOR) 50 MG tablet Take 1 tablet (50 mg total) by mouth 2 (two) times daily. 180 tablet 3  . fluticasone-salmeterol (ADVAIR HFA) 115-21 MCG/ACT inhaler Inhale 2 puffs into the lungs 2 (two) times daily as needed (WHEEZING AND SHORTNESS OF BREATH).     Vladimir Faster  Glycol-Propyl Glycol (SYSTANE OP) Place 1 drop into both eyes daily as needed (dry eyes).    . sertraline (ZOLOFT) 25 MG tablet Take 25 mg by mouth daily.     No current facility-administered medications for this visit.    Allergies  Allergen Reactions  . Augmentin [Amoxicillin-Pot Clavulanate] Nausea And Vomiting  . Biaxin [Clarithromycin] Nausea And Vomiting  . Metoprolol Other (See Comments)    Patient notes she "feels funny" after taking it  . Tequin [Gatifloxacin] Nausea Only    Social History   Social History  . Marital Status: Divorced    Spouse Name: N/A  . Number of Children: N/A  . Years of Education: N/A   Occupational History  . retired    Social History Main Topics  . Smoking status: Former Smoker -- 0.50 packs/day for 20 years    Types: Cigarettes    Quit date: 05/29/1988  . Smokeless tobacco: Never Used  . Alcohol Use: 0.0 oz/week    0 Standard drinks or equivalent per week     Comment: 1 can of beer at night  . Drug Use: No  . Sexual Activity: Not on file   Other Topics Concern  . Not on file   Social History Narrative     Review of Systems: General: negative for chills, fever, night sweats or weight changes.  Cardiovascular: negative for chest pain, dyspnea on exertion, edema, orthopnea, palpitations, paroxysmal nocturnal dyspnea or shortness of breath Dermatological: negative for rash Respiratory: negative for cough or wheezing Urologic: negative for hematuria Abdominal: negative for nausea, vomiting, diarrhea,  bright red blood per rectum, melena, or hematemesis Neurologic: negative for visual changes, syncope, or dizziness All other systems reviewed and are otherwise negative except as noted above.    Height 5\' 2"  (1.575 m).  General appearance: alert, cooperative and no distress Neck: no carotid bruit and no JVD Lungs: clear to auscultation bilaterally Heart: regular rate and rhythm, S1, S2 normal, no murmur, click, rub or  gallop Extremities: no LEE Pulses: 2+ and symmetric Skin: warm and dry Neurologic: Grossly normal  EKG NSR. 91 bpm   ASSESSMENT AND PLAN:   1. PAF: Patient converted to normal sinus rhythm with start of flecainide. EKG today confirms that she is maintaining sinus rhythm. Heart rate is 91 bpm. Continue flecainide 50 mg twice a day along with 120 mg of Cardizem. We will plan for ETT next week for flecanide monitoring. Continue Eliquis for stroke prophylaxis.   2. Chronic anticoagulation: She is on Eliquis for paroxysmal atrial fibrillation. She has recently converted chemically with flecainide. Patient reports full daily compliance twice a day. She denies any abnormal bleeding. CBC was recently checked which showed a stable hemoglobin level of 14.3.  3 Anxiety: On Rx medication, managed by her PCP, Dr. Inda Merlin.   PLAN  ETT next week for flecainide monitoring. Keep f/u with Dr. Irish Lack next month.   Lyda Jester PA-C 09/03/2015 11:31 AM

## 2015-09-06 ENCOUNTER — Encounter: Payer: Medicare Other | Admitting: Cardiology

## 2015-09-06 ENCOUNTER — Ambulatory Visit (INDEPENDENT_AMBULATORY_CARE_PROVIDER_SITE_OTHER): Payer: Medicare Other

## 2015-09-06 DIAGNOSIS — R030 Elevated blood-pressure reading, without diagnosis of hypertension: Secondary | ICD-10-CM

## 2015-09-06 DIAGNOSIS — R002 Palpitations: Secondary | ICD-10-CM

## 2015-09-06 DIAGNOSIS — Z79899 Other long term (current) drug therapy: Secondary | ICD-10-CM

## 2015-09-06 DIAGNOSIS — Z5181 Encounter for therapeutic drug level monitoring: Secondary | ICD-10-CM

## 2015-09-06 DIAGNOSIS — IMO0001 Reserved for inherently not codable concepts without codable children: Secondary | ICD-10-CM

## 2015-09-06 DIAGNOSIS — I4891 Unspecified atrial fibrillation: Secondary | ICD-10-CM | POA: Diagnosis not present

## 2015-09-06 LAB — EXERCISE TOLERANCE TEST
CHL CUP MPHR: 138 {beats}/min
CHL CUP RESTING HR STRESS: 76 {beats}/min
CSEPEDS: 11 s
CSEPEW: 4.8 METS
Exercise duration (min): 3 min
Peak HR: 141 {beats}/min
Percent HR: 102 %
RPE: 15

## 2015-09-14 ENCOUNTER — Encounter: Payer: Medicare Other | Admitting: Cardiology

## 2015-09-16 ENCOUNTER — Telehealth: Payer: Self-pay | Admitting: Interventional Cardiology

## 2015-09-16 NOTE — Telephone Encounter (Signed)
New Message  Pt c/o medication issue:   4. What is your medication issue? Pt called states that she is only getting about 3 hours of sleep per night and she believes that it is due to one of the medications she is taking. Pt is not sure which one it is. Request a call back to discuss

## 2015-09-16 NOTE — Telephone Encounter (Signed)
Any clear culprit for sx?

## 2015-09-16 NOTE — Telephone Encounter (Signed)
SPOKE  WITH PT  HAS NOT BEEN  SLEEPING  WELL  ONLY  COUPLE OF HOURS  A NIGHT . PT  STILL CONTINUES  TO  HAVE  POUNDING  HEART.  HEART RATE  TODAY  IS  79 AND B/P IS  145/97.PT  FEELS  ONE OF MEDS IS CULPRIT .  FLECAINIDE  DOES  CAUSE  ANXIETY ALSO  PT NOTES  SHAKY FEELING  AS  WELL AS  FEELS  SOME BETTER AFTER  TAKING  ZOLOFT  .WILL FORWARD  TO DR   Irish Lack  FOR REVIEW .Adonis Housekeeper

## 2015-09-17 NOTE — Telephone Encounter (Signed)
Please see Megan's note and continue with that plan.

## 2015-09-17 NOTE — Telephone Encounter (Signed)
**Note De-identified  Obfuscation** The pt is advised and she verbalized understanding and is in agreement with plan. 

## 2015-09-17 NOTE — Telephone Encounter (Signed)
Either sertraline or flecainide could be the culprit, more likely to be sertraline though. Looks like pt was recently started on sertraline within the last few months - it can cause insomnia in ~30% of patients. Pt's anxiety may be contributing as well. Flecainide was added more recently and could also be contributing (less likely - incidence of insomnia 1-3%). Would hesitate to adjust flecainide since pt now in NSR (EKG 4/7). Would have pt move sertraline dose to AM and f/u with PCP for dose adjustments if symptoms don't resolve within the next few weeks.

## 2015-09-29 ENCOUNTER — Telehealth: Payer: Self-pay | Admitting: Interventional Cardiology

## 2015-09-29 NOTE — Telephone Encounter (Signed)
Spoke with pt. She reports she noticed swelling in both ankles last night. Left greater than right. She does have varicose veins in left leg.  She reports she was on her feet more yesterday than usual. Went shopping with a friend and ate out. Had a hot dog and sauerkraut. Swelling has improved some this AM.  I told her this could be related to salt intake yesterday and I asked her to continue to monitor this and let us know if it does not continue to improve. She also reports shortness of breath. She does have COPD but shortness of breath worse in last week.  She is OK when sitting and quiet but is short of breath with walking.  Has dry hacking cough. Did have productive green cough about 2 weeks ago but not productive at present time.  Does not weigh daily and has had recent weight loss since atrial fib started. Feels heart racing at times for last couple of weeks. This is with exertion.  Is taking all her medications.  She does see Dr. Lenna Gilford for COPD and is asking if shortness of breath could be related to her atrial fib or if Dr. Irish Lack thinks she needs to get in to see Dr. Lenna Gilford before planned appt in July.  Has inhaler and is asking if OK to use with her atrial fib.

## 2015-09-29 NOTE — Telephone Encounter (Signed)
Pt c/o swelling: STAT is pt has developed SOB within 24 hours  1. How long have you been experiencing swelling? W/in 24 hrs  2. Where is the swelling located? Ankles (mostly left)  3.  Are you currently taking a "fluid pill"? no  4.  Are you currently SOB? Yes   5.  Have you traveled recently?no

## 2015-09-30 NOTE — Telephone Encounter (Signed)
OK to call in Lasix 40 mg daily prn, disp 30.  OK to try this tomorrow morning for a day and see if things are better.  Elevate legs as well.

## 2015-09-30 NOTE — Telephone Encounter (Signed)
**Note De-Identified  Obfuscation** The pt is advised that I will re send this message to Dr Irish Lack and that as soon as he reply's I will call her with his recommendations. She verbalized understanding.

## 2015-09-30 NOTE — Telephone Encounter (Signed)
F/u  Pt following up from previous note concerning her swelling. Please call back and discuss.

## 2015-10-01 MED ORDER — FUROSEMIDE 40 MG PO TABS: 40.0000 mg | ORAL_TABLET | ORAL | Status: DC | PRN

## 2015-10-01 NOTE — Telephone Encounter (Signed)
The pt is advised and she verbalized understanding. Per her request I have sent RX to [facility] to fill.

## 2015-10-15 ENCOUNTER — Ambulatory Visit (INDEPENDENT_AMBULATORY_CARE_PROVIDER_SITE_OTHER): Payer: Medicare Other | Admitting: Interventional Cardiology

## 2015-10-15 ENCOUNTER — Encounter: Payer: Self-pay | Admitting: Interventional Cardiology

## 2015-10-15 VITALS — BP 150/60 | HR 60 | Ht 62.0 in | Wt 116.6 lb

## 2015-10-15 DIAGNOSIS — R0602 Shortness of breath: Secondary | ICD-10-CM

## 2015-10-15 DIAGNOSIS — R002 Palpitations: Secondary | ICD-10-CM | POA: Diagnosis not present

## 2015-10-15 DIAGNOSIS — IMO0001 Reserved for inherently not codable concepts without codable children: Secondary | ICD-10-CM

## 2015-10-15 DIAGNOSIS — R0609 Other forms of dyspnea: Secondary | ICD-10-CM

## 2015-10-15 DIAGNOSIS — R600 Localized edema: Secondary | ICD-10-CM

## 2015-10-15 DIAGNOSIS — R030 Elevated blood-pressure reading, without diagnosis of hypertension: Secondary | ICD-10-CM | POA: Diagnosis not present

## 2015-10-15 DIAGNOSIS — I4891 Unspecified atrial fibrillation: Secondary | ICD-10-CM | POA: Diagnosis not present

## 2015-10-15 LAB — BASIC METABOLIC PANEL
BUN: 10 mg/dL (ref 7–25)
CHLORIDE: 102 mmol/L (ref 98–110)
CO2: 26 mmol/L (ref 20–31)
Calcium: 9.1 mg/dL (ref 8.6–10.4)
Creat: 0.64 mg/dL (ref 0.60–0.88)
Glucose, Bld: 87 mg/dL (ref 65–99)
POTASSIUM: 4.2 mmol/L (ref 3.5–5.3)
SODIUM: 141 mmol/L (ref 135–146)

## 2015-10-15 NOTE — Patient Instructions (Signed)
Medication Instructions:  Same-no changes  Labwork: BNP and BMET-today  Testing/Procedures: None  Follow-Up: Your physician wants you to follow-up in: 6 months. You will receive a reminder letter in the mail two months in advance. If you don't receive a letter, please call our office to schedule the follow-up appointment.     If you need a refill on your cardiac medications before your next appointment, please call your pharmacy.

## 2015-10-15 NOTE — Progress Notes (Signed)
Patient ID: [name], female   DOB: 03/11/1931, 82 y.o.   MRN: [id number]     Cardiology Office Note   Date:  10/15/2015   ID:  [name], DOB 25-Apr-1931, MRN [id number]  PCP:  Henrine Screws, MD    No chief complaint on file.    Wt Readings from Last 3 Encounters:  10/15/15 116 lb 9.6 oz (52.889 kg)  09/03/15 121 lb (54.885 kg)  08/31/15 121 lb (54.885 kg)       History of Present Illness: [name] is a 82 y.o. female  with a history of paroxysmal atrial fibrillation and elevated CHA2DS2 VASc score of 3, on chronic anticoagulation with Eliquis, who presents to clinic for post procedural follow-up. She recently presented to [facility] for elective direct-current cardioversion. This was performed by Dr. Marlou Porch on 08/31/2015. It was unsuccessful. Per procedure report, she briefly converted to normal sinus rhythm with frequent PACs after first shock. The next 3 shocks were followed with persistent A. fib. Subsequently, she was started on flecainide 50 mg BID and was instructed to continue diltiazem 120 mg daily. She was also advised to resume Eliquis twice a day.   She reports full medication compliance with flecainide along with diltiazem. She also notes full medication compliance with Eliquis. She denies any abnormal bleeding. No melena and no falls. She still has a lot of anxiety, especially at night. This is managed by her PCP Dr. Inda Merlin.  Patient had an ETT in 4/17:  Blood pressure demonstrated a hypertensive response to exercise.  No T wave inversion was noted during stress.  There was no ST segment deviation noted during stress.  Arrhythmias during stress: rare PVCs and short run of 6 beats of a-fib  Blood pressure demonstrated a hypertensive response to exercise  Overall, the patient's exercise capacity was mildly impaired.  Blood pressure demonstrated a hypertensive response to exercise.  D[facility] Treadmill Score: low risk  She still has PVCs.   She is scared to sleep supine.  She reports dyspnea on exertion with walking. She reports some occasional premature beats.    Past Medical History  Diagnosis Date  . DJD (degenerative joint disease) of knee   . Bronchiectasis ([place])     with multiple pneumonia back in 1990's  . Bronchitis     episodic  . PVC's (premature ventricular contractions)     on EKG, controlled on atenolol and caffeine reduction  . History of nuclear stress test 11/07    NORMAL  . GERD (gastroesophageal reflux disease)     episodic symptoms  . Hearing loss     chronic moderate hearing loss, worked up by ENT and hearing aids have been recommended  . Tinnitus     chronic  . Insomnia     Past Surgical History  Procedure Laterality Date  . Cyst removed      left breast  . Cardioversion N/A 08/31/2015    Procedure: CARDIOVERSION;  Surgeon: Jerline Pain, MD;  Location: [facility] ENDOSCOPY;  Service: Cardiovascular;  Laterality: N/A;     Current Outpatient Prescriptions  Medication Sig Dispense Refill  . apixaban (ELIQUIS) 2.5 MG TABS tablet Take 1 tablet (2.5 mg total) by mouth 2 (two) times daily. 60 tablet 11  . Cholecalciferol (VITAMIN D) 2000 UNITS CAPS Take 2,000 Units by mouth daily.    Marland Kitchen diltiazem (CARDIZEM CD) 120 MG 24 hr capsule Take 1 capsule (120 mg total) by mouth daily. 30 capsule 5  . flecainide (  TAMBOCOR) 50 MG tablet Take 1 tablet (50 mg total) by mouth 2 (two) times daily. 180 tablet 3  . fluticasone-salmeterol (ADVAIR HFA) 115-21 MCG/ACT inhaler Inhale 2 puffs into the lungs 2 (two) times daily as needed (WHEEZING AND SHORTNESS OF BREATH).     . furosemide (LASIX) 40 MG tablet Take 1 tablet (40 mg total) by mouth as needed for edema. 30 tablet 3  . Polyethyl Glycol-Propyl Glycol (SYSTANE OP) Place 1 drop into both eyes daily as needed (dry eyes).    . sertraline (ZOLOFT) 25 MG tablet Take 50 mg by mouth daily.      No current facility-administered medications for this visit.    Allergies:    Augmentin; Biaxin; Metoprolol; and Tequin    Social History:  The patient  reports that she quit smoking about 27 years ago. Her smoking use included Cigarettes. She has a 10 pack-year smoking history. She has never used smokeless tobacco. She reports that she drinks alcohol. She reports that she does not use illicit drugs.   Family History:  The patient's family history includes Asthma in her sister; Clotting disorder in her sister; Hypertension in her father and mother; Stroke in her father and mother. There is no history of Heart attack.    ROS:  Please see the history of present illness.   Otherwise, review of systems are positive for dyspnea on exertion.   All other systems are reviewed and negative.    PHYSICAL EXAM: VS:  BP 150/60 mmHg  Pulse 60  Ht 5\' 2"  (1.575 m)  Wt 116 lb 9.6 oz (52.889 kg)  BMI 21.32 kg/m2 , BMI Body mass index is 21.32 kg/(m^2). GEN: Well nourished, well developed, in no acute distress HEENT: normal Neck: no JVD, carotid bruits, or masses Cardiac: RRR; no murmurs, rubs, or gallops,no edema  Respiratory:  clear to auscultation bilaterally, normal work of breathing GI: soft, nontender, nondistended, + BS MS: no deformity or atrophy Skin: warm and dry, no rash Neuro:  Strength and sensation are intact Psych: euthymic mood, full affect   EKG:   The ekg ordered today demonstrates normal sinus rhythm, no ST segment changes, poor R-wave progression   Recent Labs: 07/02/2015: TSH 2.443 07/03/2015: Magnesium 2.1 08/24/2015: BUN 10; Creatinine, Ser 0.71; Hemoglobin 14.3; Platelets 171; Potassium 4.2; Sodium 143   Lipid Panel    Component Value Date/Time   CHOL 163 07/03/2015 0440   TRIG 73 07/03/2015 0440   HDL 57 07/03/2015 0440   CHOLHDL 2.9 07/03/2015 0440   VLDL 15 07/03/2015 0440   LDLCALC 91 07/03/2015 0440     Other studies Reviewed: Additional studies/ records that were reviewed today with results demonstrating: Negative stress test in April  2017.   ASSESSMENT AND PLAN:   PAF: Symptoms controlled on flecainide. She is maintaining sinus rhythm by today's ECG as well. Continue diltiazem.   Mild lower extremity edema: Continue furosemide.  Dyspnea on exertion: Mild. Will check BNP and electrolytes. May need to have her take Lasix more regularly if she does have fluid overload.  Palpitations sound like premature beats.   Current medicines are reviewed at length with the patient today.  The patient concerns regarding her medicines were addressed.  The following changes have been made:  No change  Labs/ tests ordered today include:  Orders Placed This Encounter  Procedures  . EKG 12-Lead    Recommend 150 minutes/week of aerobic exercise Low fat, low carb, high fiber diet recommended  Disposition:   FU  in 6 months   Signed, Larae Grooms, MD  10/15/2015 3:07 PM    [facility] [address], [place], [place]  [address] Phone: [phone]; Fax: [phone]

## 2015-10-16 LAB — BRAIN NATRIURETIC PEPTIDE: Brain Natriuretic Peptide: 54.2 pg/mL (ref <100)

## 2015-10-20 DIAGNOSIS — J479 Bronchiectasis, uncomplicated: Secondary | ICD-10-CM | POA: Diagnosis not present

## 2015-10-20 DIAGNOSIS — R413 Other amnesia: Secondary | ICD-10-CM | POA: Diagnosis not present

## 2015-10-20 DIAGNOSIS — F419 Anxiety disorder, unspecified: Secondary | ICD-10-CM | POA: Diagnosis not present

## 2015-10-20 DIAGNOSIS — I479 Paroxysmal tachycardia, unspecified: Secondary | ICD-10-CM | POA: Diagnosis not present

## 2015-10-22 ENCOUNTER — Telehealth: Payer: Self-pay | Admitting: Interventional Cardiology

## 2015-10-22 NOTE — Telephone Encounter (Signed)
The pt has been given her lab results and she verbalized understanding. 

## 2015-10-22 NOTE — Telephone Encounter (Signed)
New message   Pt verbalized she is calling because she got a vm today and it said to call back   but she did not hear the name or the reason for the call

## 2015-11-04 ENCOUNTER — Encounter: Payer: Self-pay | Admitting: Pulmonary Disease

## 2015-11-04 ENCOUNTER — Ambulatory Visit (INDEPENDENT_AMBULATORY_CARE_PROVIDER_SITE_OTHER): Payer: Medicare Other | Admitting: Pulmonary Disease

## 2015-11-04 VITALS — BP 112/60 | HR 58 | Temp 97.8°F | Ht 62.0 in | Wt 112.2 lb

## 2015-11-04 DIAGNOSIS — R0600 Dyspnea, unspecified: Secondary | ICD-10-CM | POA: Diagnosis not present

## 2015-11-04 DIAGNOSIS — J449 Chronic obstructive pulmonary disease, unspecified: Secondary | ICD-10-CM | POA: Diagnosis not present

## 2015-11-04 DIAGNOSIS — J479 Bronchiectasis, uncomplicated: Secondary | ICD-10-CM

## 2015-11-04 MED ORDER — CLONAZEPAM 0.5 MG PO TABS: ORAL_TABLET | ORAL | Status: DC

## 2015-11-04 NOTE — Progress Notes (Signed)
HPI    Review of Systems        Physical Exam

## 2015-11-04 NOTE — Progress Notes (Signed)
Subjective:     Patient ID: [name], female   DOB: 04-07-1931, 82 y.o.   MRN: [id number]  HPI    82 y/o WF referred for dyspnea> she has bronchiectasis/ scarring, mod COPD w/ a revers component, NEG Quant Gold assay, and sput w/ Pseudomonas x1 & NEG AFB/ Fungi;  She is folowed by [facility] for Cards w/ PAFib   ~  December 28, 2014:  Initial pulmonary consult by SN>   54 y/o WF, referred by Dr. Mertha Finders, for evaluation of dyspnea>>       [name] tells me that she was diagnosed w/ bronchiectasis ~15 yrs ago- notes that she had whooping cough as a child (but no known sequelae in childhood) and pneumonia x3 as an adult (in the 1990s she says);  She is not sure of her early course w/ her lung dis & records in Epic show old films from [place] office back to 2003 w/ similar features to current CXRs=> biapical pleuroparenchymal scarring with a +/- stable pattern;  CT Angiogram done 02/2006 showed neg for PE, biapical pleuroparenchymal scarring w/ fibronodular changes in RUL, RML, Lingula felt to represent likely post infectious scarring and NAD;  CXRs done 11/10/14 and 02/20/14 showed sl hyperinflation, flattening of diaphragms, stable biapical & upper lobe scarring, normal heart size, NAD...       Symptomatically she notes SOB/DOE- breathing harder when walking fast or exerting, going on for about 59yr & not really progressive (fairly stable dyspnea), ADLs are OK, prev walked on treadmill for 61min about 65yr ago but now not going to the gym;  She notes some chest tightness, a choking sensation in her throat, hard to get the air "IN" and a yawn seems to help temporarily;  She notes mild cough, sm amt green=>beige sput, w/o hemoptysis/ f/c/s... She is an ex-smoker having started in her teens, smoked off & on, up to 1/2 ppd, and quit in the 1980s- calc about a 15 pack-year smoking hx...       She is followed for CARDS by DrVaranasi> Hx CP & palpit w/ neg stress test and neg cath 2010; ?lifeline screen  showed Afib but never confirmed (also showed mild carotid dis); notes SOB, choking, palpit, & Holter in 2014 showed PACs & given Lorazepam by PCP w/ some relief; DrV checks her yearly- on ASA81 and Metop25Bid...       Current meds> Metop25Bid, Lorazepam0.5 prn, VitD supplement... EXAM reveals Afeb, VSS, O2sat=97% on RA;  HEENT- neg, mallampati1;  Chest- decr BS bilat, clear w/o w/r/r;  Heart- RR, gr1/6 SEM w/o r/g;  Abd- soft, neg;  Ext- neg w/o c/c/e...  Old CXRs in PACS> 2003/2007 showed biapical pleuroparenchymal scarring with a +/- stable pattern.   CT Angiogram done 02/2006 showed neg for PE, biapical pleuroparenchymal scarring w/ fibronodular changes in RUL, RML, lingula felt to represent likely post infectious scarring and NAD.   CXRs done 11/10/14 and 02/20/14 showed sl hyperinflation, flattening of diaphragms, stable biapical & upper lobe scarring, normal heart size, NAD (osteopenia & mid Tspine compression)...   PFT 11/20/14 showed FVC=1.80 (78%), FEV1=0.90 (53%), %1sec=50, mid-flows reduced at 31% predicted; post bronchodil there was a 19% improvement in FEV1; LungVols- TLC=4.66 (98%), RV=2.73 (117%), RV/TLC=59%; DLCO is reduced at 43% predicted...   LABS 12/28/14:  We checked BMet- wnl;  CBC- wnl w/ 2% eos;  Sed=16;  Quantiferon GOLD= NEG...  Hi-res CT Chest => sched for 01/05/15 & pending     IMP/PLAN>>  76 y/o woman  w/ mild smoking hx & quit 30 yrs ago; she has mod obstructive lung dis on PFT w/ reversible component AND decr DLCO; Labs appear neg & QuantGold is neg as well- still could have underlying bronchiectasis & poss atypic mycobactium, awaiting Hi-res CT Chest, consider getting sput for cult/ AFB; In any event her parenchymal dis does not appear to be very progressive;  REC- trial SYMBICORT160- 2sp Bid and we will recheck pt in 4-6 weeks...   ADDENDUM>>  CT Chest 01/05/15 showed norm heart size, atherosclerosis, sl dilated PA trunk at 3.5cm suggestion PAH, no adenopathy;  Lungs show  bilat patchy bronchiectasis & interstitial thickening w/ peribronchvasc nodularity (sl worse than 2007) and no ground glass, reticulation, honeycombing => we will check sput specimen for cult & AFB... ADDENDUM>>  Sputum C&S 01/15/15 was pos for Pseudomonas (resist to Rocephin, sens to Cipro etc)- likely colonizer;  AFB smears are neg & culture pending...   ~  February 08, 2015:  6wk ROV w/ SN>  [name] reports that her breathing is improved, less SOB, notes that she walked a lot over the weekend at the [employer] did well;  She denies much cough, sput, no hemoptysis, no CP, no f/c/s... She remains on Advair115-2spBid and Lorazepam 0.5mg  prn...     Mod obstructive lung dis w/ a revers component & GOLD Stage3 COPD on PFTs> on ADVAIR115-2spBid & she is improved w/ incr exercise tolerance    Bronchiectasis & pleuroparenchymal scarring on CT, r/o MAI infection/ [place] syndrome> AFB cult is neg x1    Pseudomonas colonization> the pseudomonas is sens to Cipro... EXAM reveals Afeb, VSS, O2sat=97% on RA;  HEENT- neg, mallampati1;  Chest- decr BS bilat, clear w/o w/r/r;  Heart- RR, gr1/6 SEM w/o r/g;  Abd- soft, neg;  Ext- neg w/o c/c/e... IMP/PLAN>>  [name] is stable=> improved, asked to continue the Advair115-2spBid; she has already had the 2016 Flu vaccine; we plan ROV in 3-4 moths, sooner if needed...   ~  June 10, 2015:  57mo ROV w/ SN>  [name] remains stable- breathing is good, no prob w/ winter weather so far, she remains active & denies cough, sput, hemoptysis; she has chr stable DOE & trying to exercise; she wonders if the Advair250-DPI inhaler would be better for her & we gave her a sample 7 new Rx so she can decide...      Mod obstructive lung dis w/ a revers component & GOLD Stage3 COPD on PFTs> on ADVAIR115-2spBid & she is improved w/ incr exercise tolerance    Bronchiectasis & pleuroparenchymal scarring on CT, r/o MAI infection/ [place] syndrome> AFB cult is neg x1    Pseudomonas  colonization> the pseudomonas is sens to Cipro... EXAM reveals Afeb, VSS, O2sat=96% on RA;  HEENT- neg, mallampati1;  Chest- decr BS bilat, clear w/o w/r/r;  Heart- RR, gr1/6 SEM w/o r/g;  Abd- soft, neg;  Ext- neg w/o c/c/e... IMP/PLAN>>  [name] remains stable-- she will decide if she prefers the MDI vs the DPI inhaletr; asked to incr exercise program, call for any problems and we plan routine f/u 46mo...   ~  November 04, 2015:  45mo ROV w/ SN>  [name] has had a lot of difficulty in the interval- AFib w/ rvr Feb2017 and Adm 1d for IV Cardizem drip, converted to NSR & disch on Metop50Bid & Eliquis2.5Bid;  Then she developed recurrent AFib on 08/31/15 & failed DCCV=> placed on Flecainide & converted to NSR;  She has followed up  w/ DrVaranasi 10/15/15 on Flecainide50Bid, CardizemCD120, Eliquis2.5Bid and Lasix40 prn; Labs were OK, BNP=54 and told to continue same...     She presents today w/ a ?2wk hx of SOB, ?choking sensation, heavy feeling in upper chest/throat area- hard for her to describe; it is intermittent- ?when noted, ?ppt, nothing relieves it;  She stopped her Advair several weeks ago on her own therefore restarted recently;  She denies CP or palp sensation, denies dizzy/ lightheaded/ syncope;  She denies cough/ sput/ hemoptysis/ etc;  When asked to describe her SOB further- she cannot, asked if it's hard to get the air "in" or "out" she says both;  Daughter adds that she's been under a lot of stress and quite anxious- ?had a reaction to Lorazepam months ago & DrGates switched her to Zoloft;  Exam today showed sl irreg HR and EKGs showed NSR but sinus pauses & then ?escape beats=> she did not sense these episodes on the EKG machine (not c/o dizzy/ lightheaded/ palpit/ etc)...    Mod obstructive lung dis w/ a revers component & GOLD Stage3 COPD on PFTs> on ADVAIR115-2spBid & she is improved w/ incr exercise tolerance    Bronchiectasis & pleuroparenchymal scarring on CT, r/o MAI infection/ [place] syndrome>  QuantGOLD is neg, AFB cult is neg x1    Pseudomonas colonization> the pseudomonas was sens to Cipro...    PAF>  eval & Rx per DrVaranasi on Flecainide50Bid, CardizemCD120, Eliquis2.5Bid and Lasix40 prn; EKG today shows NSR but assoc w/ few pauses (~1.5sec, HR down to ~40) EXAM reveals Afeb, VSS, O2sat=96% on RA;  HEENT- neg, mallampati1;  Chest- decr BS bilat, clear w/o w/r/r;  Heart- sl irregularity, gr1/6 SEM w/o r/g;  Abd- soft, neg;  Ext- neg w/o c/c/e...  EKG 07/02/15 w/ AFib & rvr, rate=130, poor R progression V1-3...  2DEcho 07/02/15>  Norm LV size & function w/ EF=55-60%, no regional wall motion abn, norm AoV, essentially norm MV w/ trivMR, norm LA/RA, norm RV w/ RVsys=19...  EKG after chemical cardioversion> NSR, rate=80, RAE, poor R prograssion V1-3  EKG today 11/04/15>  NSR w/ sinus pauses up to 1.5sec documented (HR~40 in that interval)RAE, poor R progression V1-3... IMP/PLAN>>  I am not sure what is going on here; she is certainly quite anxious but puts up a good front for daughter; she does NOT describe the dyspnea as the classic "can't get a DB, can't get the air IN", but has a discomfort ?choking sensation in throat/ upper chest (?globus)- btw she denies any heartburn symptoms;  I'm concerned the symptoms could be a manifestation of an arrhythmia & I'm inclined to suggest a HOLTER monitor for further eval but I'd like to discuss w/ DrVaranasi first; she is NOT on a Benzo- just Zoloft now- & I am inclined to start KLONOPIN 0.5mg  tabs- 1/2 to 1 tab PO Bid...we plan ROV recheck in 87mo...    Past Medical History  Diagnosis Date  . DJD (degenerative joint disease) of knee   . Bronchiectasis ([place])     with multiple pneumonia back in 1990's  . Bronchitis     episodic  . PVC's (premature ventricular contractions)     on EKG, controlled on atenolol and caffeine reduction  . History of nuclear stress test 11/07    NORMAL  . GERD (gastroesophageal reflux disease)     episodic symptoms  .  Hearing loss     chronic moderate hearing loss, worked up by ENT and hearing aids have been recommended  .  Tinnitus     chronic  . Insomnia     Past Surgical History  Procedure Laterality Date  . Cyst removed      left breast  . Cardioversion N/A 08/31/2015    Procedure: CARDIOVERSION;  Surgeon: Jerline Pain, MD;  Location: [facility] ENDOSCOPY;  Service: Cardiovascular;  Laterality: N/A;    Outpatient Encounter Prescriptions as of 11/04/2015  Medication Sig  . apixaban (ELIQUIS) 2.5 MG TABS tablet Take 1 tablet (2.5 mg total) by mouth 2 (two) times daily.  . Cholecalciferol (VITAMIN D) 2000 UNITS CAPS Take 2,000 Units by mouth daily.  Marland Kitchen diltiazem (CARDIZEM CD) 120 MG 24 hr capsule Take 1 capsule (120 mg total) by mouth daily.  . flecainide (TAMBOCOR) 50 MG tablet Take 1 tablet (50 mg total) by mouth 2 (two) times daily.  . fluticasone-salmeterol (ADVAIR HFA) 115-21 MCG/ACT inhaler Inhale 2 puffs into the lungs 2 (two) times daily as needed (WHEEZING AND SHORTNESS OF BREATH).   . furosemide (LASIX) 40 MG tablet Take 1 tablet (40 mg total) by mouth as needed for edema.  Vladimir Faster Glycol-Propyl Glycol (SYSTANE OP) Place 1 drop into both eyes daily as needed (dry eyes).  . sertraline (ZOLOFT) 25 MG tablet Take 50 mg by mouth daily.     Allergies  Allergen Reactions  . Augmentin [Amoxicillin-Pot Clavulanate] Nausea And Vomiting  . Biaxin [Clarithromycin] Nausea And Vomiting  . Metoprolol Other (See Comments)    Patient notes she "feels funny" after taking it  . Tequin [Gatifloxacin] Nausea Only    Current Medications, Allergies, Past Medical History, Past Surgical History, Family History, and Social History were reviewed in [employer] record.   Review of Systems            All symptoms NEG except where BOLDED >>  Constitutional:  F/C/S, fatigue, anorexia, unexpected weight change. HEENT:  HA, visual changes, hearing loss, earache, nasal symptoms, sore throat,  mouth sores, hoarseness. Resp:  cough, sputum, hemoptysis; SOB, tightness, wheezing. Cardio:  CP, palpit, DOE, orthopnea, edema. GI:  N/V/D/C, blood in stool; reflux, abd pain, distention, gas. GU:  dysuria, freq, urgency, hematuria, flank pain, voiding difficulty. MS:  joint pain, swelling, tenderness, decr ROM; neck pain, back pain, etc. Neuro:  HA, tremors, seizures, dizziness, syncope, weakness, numbness, gait abn. Skin:  suspicious lesions or skin rash. Heme:  adenopathy, bruising, bleeding. Psyche:  confusion, agitation, sleep disturbance, hallucinations, anxiety, depression suicidal.   Objective:   Physical Exam      Vital Signs:  Reviewed...  General:  WD, WN, 82 y/o WM in NAD; alert & oriented; pleasant & cooperative... HEENT:  [place]/AT; Conjunctiva- pink, Sclera- nonicteric, EOM-wnl, PERRLA, EACs-clear, TMs-wnl; NOSE-clear; THROAT-clear & wnl. Neck:  Supple w/ fairl ROM; no JVD; normal carotid impulses w/o bruits; no thyromegaly or nodules palpated; no lymphadenopathy. Chest:  Sl decr BS at bases, clear to P & A without wheezes, rales, or signs of consolidation... Heart:  Regular Rhythm; norm S1 & S2 without murmurs, rubs, or gallops detected. Abdomen:  Soft & nontender- no guarding or rebound; normal bowel sounds; no organomegaly or masses palpated. Ext:  decrROM; without deformities +arthritic changes; no varicose veins, venous insuffic, or edema;  Pulses intact w/o bruits. Neuro:  No focal neuro deficits; sensory testing normal; gait normal & balance OK. Derm:  No lesions noted; no rash etc. Lymph:  No cervical, supraclavicular, axillary, or inguinal adenopathy palpated.   Assessment:      IMP >>  Mod obstructive lung dis w/ a revers component & GOLD Stage3 COPD on PFTs> on ADVAIR115-2spBid & she was improved w/ incr exercise tolerance    Bronchiectasis & pleuroparenchymal scarring on CT, r/o MAI infection/ [place] syndrome> QuantGOLD is neg, AFB cult is neg x1     Pseudomonas colonization> the pseudomonas was sens to Cipro...    PAF>  eval & Rx per DrVaranasi on Flecainide50Bid, CardizemCD120, Eliquis2.5Bid and Lasix40 prn; EKG today shows NSR but assoc w/ few pauses (~1.5sec, HR down to ~40)  PLAN >> 12/28/14>   58 y/o woman w/ mild smoking hx & quit 30 yrs ago; she has mod obstructive lung dis on PFT w/ reversible component AND decr DLCO; Labs appear neg & QuantGold is neg as well- still could have underlying bronchiectasis & poss atypic mycobactium, awaiting Hi-res CT Chest, consider getting sput for cult/ AFB; In any event her parenchymal dis does not appear to be very progressive;  REC- trial SYMBICORT160- 2sp Bid and we will recheck pt in 4-6 weeks. 02/08/15>   [name] is stable=> improved, asked to continue the [place]; she has already had the 2016 Flu vaccine; we plan ROV in 3-4 moths, sooner if needed... 06/11/15>  [name] remains stable-- she will decide if she prefers the MDI vs the DPI inhaletr; asked to incr exercise program, call for any problems and we plan routine f/u 21mo. 11/04/15>   She has had a lot of trouble w/ AFib; ?what is causing her dyspnea sensation (?cardiac arrhythmia, anxiety, chest wall factors); we will consider checking a holter monitor & start rx w/ Klonopin 0.5mg - 1/2to1tab Bid...     Plan:     Patient's Medications  New Prescriptions   CLONAZEPAM (KLONOPIN) 0.5 MG TABLET    Take 1/2 to 1 tablet twice daily  Previous Medications   APIXABAN (ELIQUIS) 2.5 MG TABS TABLET    Take 1 tablet (2.5 mg total) by mouth 2 (two) times daily.   CHOLECALCIFEROL (VITAMIN D) 2000 UNITS CAPS    Take 2,000 Units by mouth daily.   DILTIAZEM (CARDIZEM CD) 120 MG 24 HR CAPSULE    Take 1 capsule (120 mg total) by mouth daily.   FLECAINIDE (TAMBOCOR) 50 MG TABLET    Take 1 tablet (50 mg total) by mouth 2 (two) times daily.   FLUTICASONE-SALMETEROL (ADVAIR HFA) 115-21 MCG/ACT INHALER    Inhale 2 puffs into the lungs 2 (two) times daily as needed  (WHEEZING AND SHORTNESS OF BREATH).    FUROSEMIDE (LASIX) 40 MG TABLET    Take 1 tablet (40 mg total) by mouth as needed for edema.   POLYETHYL GLYCOL-PROPYL GLYCOL (SYSTANE OP)    Place 1 drop into both eyes daily as needed (dry eyes).   SERTRALINE (ZOLOFT) 25 MG TABLET    Take 50 mg by mouth daily.   Modified Medications   No medications on file  Discontinued Medications   No medications on file

## 2015-11-04 NOTE — Patient Instructions (Signed)
Today we updated your med list in our EPIC system...    Continue your current medications the same...  We reviewed your history of the shortness of breath & discomfort in the throat area...    It could be anxiety & a type of muscle spasm in the chest wall/ upper chest...    It could be your atrial fib coming back & the irregularity is felt in the upper chest region...    It could be related to acid reflux into your throat from your esophagus, but your history does not suggest this cause...  Your EKG today shows some irregularity & I will call your cardiologist to see if they want to do a 24H EKG recording...  We decided to treat your shortness of breath w/ a combination relaxer- KLONOPIN 05mg  tabs> take 1/2 to 1 tab twice daily...    Start w/ 1/2 tab twice daily for a wk to 10d;  Then plan to increase the dose to 1 tab twice daily if the sensation has not resolved   Call for any questions...  Let's plan a follow up visit in 50month.Marland KitchenMarland Kitchen

## 2015-11-10 DIAGNOSIS — H52203 Unspecified astigmatism, bilateral: Secondary | ICD-10-CM | POA: Diagnosis not present

## 2015-11-10 DIAGNOSIS — H5203 Hypermetropia, bilateral: Secondary | ICD-10-CM | POA: Diagnosis not present

## 2015-11-10 DIAGNOSIS — H25813 Combined forms of age-related cataract, bilateral: Secondary | ICD-10-CM | POA: Diagnosis not present

## 2015-11-23 ENCOUNTER — Ambulatory Visit: Payer: Medicare Other | Admitting: Pulmonary Disease

## 2015-12-02 ENCOUNTER — Encounter: Payer: Self-pay | Admitting: Pulmonary Disease

## 2015-12-02 ENCOUNTER — Ambulatory Visit (INDEPENDENT_AMBULATORY_CARE_PROVIDER_SITE_OTHER): Payer: Medicare Other | Admitting: Pulmonary Disease

## 2015-12-02 VITALS — BP 132/60 | HR 50 | Temp 97.9°F | Wt 119.4 lb

## 2015-12-02 DIAGNOSIS — J449 Chronic obstructive pulmonary disease, unspecified: Secondary | ICD-10-CM | POA: Diagnosis not present

## 2015-12-02 DIAGNOSIS — J479 Bronchiectasis, uncomplicated: Secondary | ICD-10-CM | POA: Diagnosis not present

## 2015-12-02 DIAGNOSIS — I480 Paroxysmal atrial fibrillation: Secondary | ICD-10-CM | POA: Diagnosis not present

## 2015-12-02 DIAGNOSIS — R0600 Dyspnea, unspecified: Secondary | ICD-10-CM

## 2015-12-02 NOTE — Patient Instructions (Signed)
Today we updated your med list in our EPIC system...    Continue your current medications the same...  I am delighted that you are feeling so much better on the low dose Klonopin medication...  I would like you to have a 48-72hour HOLTER MONITOR to check your heart for any arrhythmia or any pauses...    We will try to set this up in the coming weeks after your beach vacation...    We will contact you w/ the results when available...   Call for any problems- dizziness, syncope (blackouts), etc...  Let's plan a routine follow up visit in 21mo, sooner if needed for any problems.Marland KitchenMarland Kitchen

## 2015-12-02 NOTE — Progress Notes (Signed)
Subjective:     Patient ID: [name], female   DOB: 11/23/1930, 82 y.o.   MRN: [id number]  HPI    82 y/o WF referred for dyspnea> she has bronchiectasis/ scarring, mod COPD w/ a revers component, NEG Quant Gold assay, and sput w/ Pseudomonas x1 & NEG AFB/ Fungi;  She is followed by [facility] for Cards w/ PAFib...   ~  December 28, 2014:  Initial pulmonary consult by SN>   89 y/o WF, referred by Dr. Mertha Finders, for evaluation of dyspnea>>       [name] tells me that she was diagnosed w/ bronchiectasis ~15 yrs ago- notes that she had whooping cough as a child (but no known sequelae in childhood) and pneumonia x3 as an adult (in the 1990s she says);  She is not sure of her early course w/ her lung dis & records in Epic show old films from [place] office back to 2003 w/ similar features to current CXRs=> biapical pleuroparenchymal scarring with a +/- stable pattern;  CT Angiogram done 02/2006 showed neg for PE, biapical pleuroparenchymal scarring w/ fibronodular changes in RUL, RML, Lingula felt to represent likely post infectious scarring and NAD;  CXRs done 11/10/14 and 02/20/14 showed sl hyperinflation, flattening of diaphragms, stable biapical & upper lobe scarring, normal heart size, NAD...       Symptomatically she notes SOB/DOE- breathing harder when walking fast or exerting, going on for about 66yr & not really progressive (fairly stable dyspnea), ADLs are OK, prev walked on treadmill for 59min about 19yr ago but now not going to the gym;  She notes some chest tightness, a choking sensation in her throat, hard to get the air "IN" and a yawn seems to help temporarily;  She notes mild cough, sm amt green=>beige sput, w/o hemoptysis/ f/c/s... She is an ex-smoker having started in her teens, smoked off & on, up to 1/2 ppd, and quit in the 1980s- calc about a 15 pack-year smoking hx...       She is followed for CARDS by DrVaranasi> Hx CP & palpit w/ neg stress test and neg cath 2010; ?lifeline screen  showed Afib but never confirmed (also showed mild carotid dis); notes SOB, choking, palpit, & Holter in 2014 showed PACs & given Lorazepam by PCP w/ some relief; DrV checks her yearly- on ASA81 and Metop25Bid...       Current meds> Metop25Bid, Lorazepam0.5 prn, VitD supplement... EXAM reveals Afeb, VSS, O2sat=97% on RA;  HEENT- neg, mallampati1;  Chest- decr BS bilat, clear w/o w/r/r;  Heart- RR, gr1/6 SEM w/o r/g;  Abd- soft, neg;  Ext- neg w/o c/c/e...  Old CXRs in PACS> 2003/2007 showed biapical pleuroparenchymal scarring with a +/- stable pattern.   CT Angiogram done 02/2006 showed neg for PE, biapical pleuroparenchymal scarring w/ fibronodular changes in RUL, RML, lingula felt to represent likely post infectious scarring and NAD.   CXRs done 11/10/14 and 02/20/14 showed sl hyperinflation, flattening of diaphragms, stable biapical & upper lobe scarring, normal heart size, NAD (osteopenia & mid Tspine compression)...   PFT 11/20/14 showed FVC=1.80 (78%), FEV1=0.90 (53%), %1sec=50, mid-flows reduced at 31% predicted; post bronchodil there was a 19% improvement in FEV1; LungVols- TLC=4.66 (98%), RV=2.73 (117%), RV/TLC=59%; DLCO is reduced at 43% predicted...   LABS 12/28/14:  We checked BMet- wnl;  CBC- wnl w/ 2% eos;  Sed=16;  Quantiferon GOLD= NEG...  Hi-res CT Chest => sched for 01/05/15 & pending     IMP/PLAN>>  53 y/o woman  w/ mild smoking hx & quit 30 yrs ago; she has mod obstructive lung dis on PFT w/ reversible component AND decr DLCO; Labs appear neg & QuantGold is neg as well- still could have underlying bronchiectasis & poss atypic mycobactium, awaiting Hi-res CT Chest, consider getting sput for cult/ AFB; In any event her parenchymal dis does not appear to be very progressive;  REC- trial SYMBICORT160- 2sp Bid and we will recheck pt in 4-6 weeks...   ADDENDUM>>  CT Chest 01/05/15 showed norm heart size, atherosclerosis, sl dilated PA trunk at 3.5cm suggestion PAH, no adenopathy;  Lungs show  bilat patchy bronchiectasis & interstitial thickening w/ peribronchvasc nodularity (sl worse than 2007) and no ground glass, reticulation, honeycombing => we will check sput specimen for cult & AFB.Marland Kitchen. (2DEcho 06/2015 w/ PAsys=19) ADDENDUM>>  Sputum C&S 01/15/15 was pos for Pseudomonas (resist to Rocephin, sens to Cipro etc)- likely colonizer;  AFB smears are neg & culture pending...   ~  February 08, 2015:  6wk ROV w/ SN>  [name] reports that her breathing is improved, less SOB, notes that she walked a lot over the weekend at the [place] did well;  She denies much cough, sput, no hemoptysis, no CP, no f/c/s... She remains on Advair115-2spBid and Lorazepam 0.5mg  prn...     Mod obstructive lung dis w/ a revers component & GOLD Stage3 COPD on PFTs> on ADVAIR115-2spBid & she is improved w/ incr exercise tolerance    Bronchiectasis & pleuroparenchymal scarring on CT, r/o MAI infection/ [place] syndrome> AFB cult is neg x1    Pseudomonas colonization> the pseudomonas is sens to Cipro... EXAM reveals Afeb, VSS, O2sat=97% on RA;  HEENT- neg, mallampati1;  Chest- decr BS bilat, clear w/o w/r/r;  Heart- RR, gr1/6 SEM w/o r/g;  Abd- soft, neg;  Ext- neg w/o c/c/e... IMP/PLAN>>  [name] is stable=> improved, asked to continue the Advair115-2spBid; she has already had the 2016 Flu vaccine; we plan ROV in 3-4 moths, sooner if needed...   ~  June 10, 2015:  18mo ROV w/ SN>  [name] remains stable- breathing is good, no prob w/ winter weather so far, she remains active & denies cough, sput, hemoptysis; she has chr stable DOE & trying to exercise; she wonders if the Advair250-DPI inhaler would be better for her & we gave her a sample 7 new Rx so she can decide...      Mod obstructive lung dis w/ a revers component & GOLD Stage3 COPD on PFTs> on ADVAIR115-2spBid & she is improved w/ incr exercise tolerance    Bronchiectasis & pleuroparenchymal scarring on CT, r/o MAI infection/ [place] syndrome> AFB  cult is neg x1    Pseudomonas colonization> the pseudomonas is sens to Cipro... EXAM reveals Afeb, VSS, O2sat=96% on RA;  HEENT- neg, mallampati1;  Chest- decr BS bilat, clear w/o w/r/r;  Heart- RR, gr1/6 SEM w/o r/g;  Abd- soft, neg;  Ext- neg w/o c/c/e... IMP/PLAN>>  [name] remains stable-- she will decide if she prefers the MDI vs the DPI inhaletr; asked to incr exercise program, call for any problems and we plan routine f/u 25mo...   ~  November 04, 2015:  22mo ROV w/ SN>  [name] has had a lot of difficulty in the interval- AFib w/ rvr Feb2017 and Adm 1d for IV Cardizem drip, converted to NSR & disch on Metop50Bid & Eliquis2.5Bid;  Then she developed recurrent AFib on 08/31/15 & failed DCCV=> placed on Flecainide & converted to NSR;  She has followed up w/ DrVaranasi 10/15/15 on Flecainide50Bid, CardizemCD120, Eliquis2.5Bid and Lasix40 prn; Labs were OK, BNP=54 and told to continue same...     She presents today w/ a ?2wk hx of SOB, ?choking sensation, heavy feeling in upper chest/throat area- hard for her to describe; it is intermittent- ?when noted, ?ppt, nothing relieves it;  She stopped her Advair several weeks ago on her own therefore restarted recently;  She denies CP or palp sensation, denies dizzy/ lightheaded/ syncope;  She denies cough/ sput/ hemoptysis/ etc;  When asked to describe her SOB further- she cannot, asked if it's hard to get the air "in" or "out" she says both;  Daughter adds that she's been under a lot of stress and quite anxious- ?had a reaction to Lorazepam months ago & DrGates switched her to Zoloft;  Exam today showed sl irreg HR and EKGs showed NSR but sinus pauses & then ?escape beats=> she did not sense these episodes on the EKG machine (not c/o dizzy/ lightheaded/ palpit/ etc)...    Mod obstructive lung dis w/ a revers component & GOLD Stage3 COPD on PFTs> on ADVAIR115-2spBid & she is improved w/ incr exercise tolerance    Bronchiectasis & pleuroparenchymal scarring on CT, r/o MAI  infection/ [place] syndrome> QuantGOLD is neg, AFB cult is neg x1    Pseudomonas colonization> the pseudomonas was sens to Cipro...    PAF>  eval & Rx per DrVaranasi on Flecainide50Bid, CardizemCD120, Eliquis2.5Bid and Lasix40 prn; EKG today shows NSR but assoc w/ few pauses (~1.5sec, HR down to ~40) EXAM reveals Afeb, VSS, O2sat=96% on RA;  HEENT- neg, mallampati1;  Chest- decr BS bilat, clear w/o w/r/r;  Heart- sl irregularity, gr1/6 SEM w/o r/g;  Abd- soft, neg;  Ext- neg w/o c/c/e...  EKG 07/02/15 w/ AFib & rvr, rate=130, poor R progression V1-3...  2DEcho 07/02/15>  Norm LV size & function w/ EF=55-60%, no regional wall motion abn, norm AoV, essentially norm MV w/ trivMR, norm LA/RA, norm RV w/ RVsys=19...  EKG after chemical cardioversion> NSR, rate=80, RAE, poor R prograssion V1-3  EKG today 11/04/15>  NSR w/ sinus pauses up to 1.5sec documented (HR~40 in that interval)RAE, poor R progression V1-3... IMP/PLAN>>  I am not sure what is going on here; she is certainly quite anxious but puts up a good front for daughter; she does NOT describe the dyspnea as the classic "can't get a DB, can't get the air IN", but has a discomfort ?choking sensation in throat/ upper chest (?globus)- btw she denies any heartburn symptoms;  I'm concerned the symptoms could be a manifestation of an arrhythmia & I'm inclined to suggest a HOLTER monitor for further eval but I'd like to discuss w/ DrVaranasi first; she is NOT on a Benzo- just Zoloft now- & I am inclined to start KLONOPIN 0.5mg  tabs- 1/2 to 1 tab PO Bid...we plan ROV recheck in 55mo...  ~  December 02, 2015:  100mo ROV & [name] reports a prompt and complete response to the [facility] 0.5mg - 1/2Bid; this has eliminated her choking sensation, chest discomfort, etc and she is now resting well at night, awakes refreshed & feeling better; she continues to deny palpit, dizziness, lightheadedness, near syncope, etc...  EXAM shows Afeb, VSS, O2sat=96% on RA;  HEENT- neg,  mallampati1;  Chest- decr BS bilat, clear w/o w/r/r;  Heart- sl irregularity, gr1/6 SEM w/o r/g;  Abd- soft, neg;  Ext- neg w/o c/c/e...  EKG today 12/02/15> ?SBrady, rate 43, some variability in p  wave, poor R prog V1-2... Longest pause=1.7sec on this EKG. IMP/PLAN>>  [name] is feeling much better on the Klonopin & we will continue same;  I am still concerned about the arryhthmia, long r-r interval, slow HR, p-wave morphology differs => decided to proceed w/ HOLTER MONITOR to be sure she is not having any more serious pauses/ arrhythmias...     Past Medical History  Diagnosis Date  . DJD (degenerative joint disease) of knee   . Bronchiectasis ([place])     with multiple pneumonia back in 1990's  . Bronchitis     episodic  . PVC's (premature ventricular contractions)     on EKG, controlled on atenolol and caffeine reduction  . History of nuclear stress test 11/07    NORMAL  . GERD (gastroesophageal reflux disease)     episodic symptoms  . Hearing loss     chronic moderate hearing loss, worked up by ENT and hearing aids have been recommended  . Tinnitus     chronic  . Insomnia     Past Surgical History  Procedure Laterality Date  . Cyst removed      left breast  . Cardioversion N/A 08/31/2015    Procedure: CARDIOVERSION;  Surgeon: Jerline Pain, MD;  [facility] ENDOSCOPY;  Service: Cardiovascular;  Laterality: N/A;    Outpatient Encounter Prescriptions as of 12/02/2015  Medication Sig  . apixaban (ELIQUIS) 2.5 MG TABS tablet Take 1 tablet (2.5 mg total) by mouth 2 (two) times daily.  . Cholecalciferol (VITAMIN D) 2000 UNITS CAPS Take 2,000 Units by mouth daily.  . clonazePAM (KLONOPIN) 0.5 MG tablet Take 1/2 to 1 tablet twice daily  . diltiazem (CARDIZEM CD) 120 MG 24 hr capsule Take 1 capsule (120 mg total) by mouth daily.  . flecainide (TAMBOCOR) 50 MG tablet Take 1 tablet (50 mg total) by mouth 2 (two) times daily.  . fluticasone-salmeterol (ADVAIR HFA) 115-21 MCG/ACT inhaler Inhale  2 puffs into the lungs 2 (two) times daily as needed (WHEEZING AND SHORTNESS OF BREATH).   Vladimir Faster Glycol-Propyl Glycol (SYSTANE OP) Place 1 drop into both eyes daily as needed (dry eyes).  . sertraline (ZOLOFT) 25 MG tablet Take 50 mg by mouth daily.   . furosemide (LASIX) 40 MG tablet Take 1 tablet (40 mg total) by mouth as needed for edema. (Patient not taking: Reported on 12/02/2015)   No facility-administered encounter medications on file as of 12/02/2015.    Allergies  Allergen Reactions  . Augmentin [Amoxicillin-Pot Clavulanate] Nausea And Vomiting  . Biaxin [Clarithromycin] Nausea And Vomiting  . Metoprolol Other (See Comments)    Patient notes she "feels funny" after taking it  . Tequin [Gatifloxacin] Nausea Only    Current Medications, Allergies, Past Medical History, Past Surgical History, Family History, and Social History were reviewed in [employer] record.   Review of Systems            All symptoms NEG except where BOLDED >>  Constitutional:  F/C/S, fatigue, anorexia, unexpected weight change. HEENT:  HA, visual changes, hearing loss, earache, nasal symptoms, sore throat, mouth sores, hoarseness. Resp:  cough, sputum, hemoptysis; SOB, tightness, wheezing. Cardio:  CP, palpit, DOE, orthopnea, edema. GI:  N/V/D/C, blood in stool; reflux, abd pain, distention, gas. GU:  dysuria, freq, urgency, hematuria, flank pain, voiding difficulty. MS:  joint pain, swelling, tenderness, decr ROM; neck pain, back pain, etc. Neuro:  HA, tremors, seizures, dizziness, syncope, weakness, numbness, gait abn. Skin:  suspicious lesions or skin  rash. Heme:  adenopathy, bruising, bleeding. Psyche:  confusion, agitation, sleep disturbance, hallucinations, anxiety, depression suicidal.   Objective:   Physical Exam      Vital Signs:  Reviewed...  General:  WD, WN, 82 y/o WM in NAD; alert & oriented; pleasant & cooperative... HEENT:  [place]/AT; Conjunctiva- pink, Sclera-  nonicteric, EOM-wnl, PERRLA, EACs-clear, TMs-wnl; NOSE-clear; THROAT-clear & wnl. Neck:  Supple w/ fairl ROM; no JVD; normal carotid impulses w/o bruits; no thyromegaly or nodules palpated; no lymphadenopathy. Chest:  Sl decr BS at bases, clear to P & A without wheezes, rales, or signs of consolidation... Heart:  Regular Rhythm; norm S1 & S2 without murmurs, rubs, or gallops detected. Abdomen:  Soft & nontender- no guarding or rebound; normal bowel sounds; no organomegaly or masses palpated. Ext:  decrROM; without deformities +arthritic changes; no varicose veins, venous insuffic, or edema;  Pulses intact w/o bruits. Neuro:  No focal neuro deficits; sensory testing normal; gait normal & balance OK. Derm:  No lesions noted; no rash etc. Lymph:  No cervical, supraclavicular, axillary, or inguinal adenopathy palpated.   Assessment:      IMP >>     Mod obstructive lung dis w/ a revers component & GOLD Stage3 COPD on PFTs> on ADVAIR115-2spBid & she was improved w/ incr exercise tolerance    Bronchiectasis & pleuroparenchymal scarring on CT, r/o MAI infection/ [place] syndrome> QuantGOLD is neg, AFB cult is neg x1    Pseudomonas colonization> the pseudomonas was sens to Cipro...    PAF>  eval & Rx per DrVaranasi on Flecainide50Bid, CardizemCD120, Eliquis2.5Bid and Lasix40 prn; EKG today shows NSR but assoc w/ few pauses (~1.7sec, HR down to ~40s)  PLAN >> 12/28/14>   87 y/o woman w/ mild smoking hx & quit 30 yrs ago; she has mod obstructive lung dis on PFT w/ reversible component AND decr DLCO; Labs appear neg & QuantGold is neg as well- still could have underlying bronchiectasis & poss atypic mycobactium, awaiting Hi-res CT Chest, consider getting sput for cult/ AFB; In any event her parenchymal dis does not appear to be very progressive;  REC- trial SYMBICORT160- 2sp Bid and we will recheck pt in 4-6 weeks. 02/08/15>   [name] is stable=> improved, asked to continue the [place]; she has  already had the 2016 Flu vaccine; we plan ROV in 3-4 moths, sooner if needed... 06/11/15>  [name] remains stable-- she will decide if she prefers the MDI vs the DPI inhaletr; asked to incr exercise program, call for any problems and we plan routine f/u 63mo. 11/04/15>   She has had a lot of trouble w/ AFib; ?what is causing her dyspnea sensation (?cardiac arrhythmia, anxiety, chest wall factors); we will consider checking a holter monitor & start rx w/ Klonopin 0.5mg - 1/2to1tab Bid... 12/02/15>   Much improved on the Klonopin; still w/ slow HR, sl irreg w/ pauses, & we will proceed w/ HOLTER MONITOR...     Plan:     Patient's Medications  New Prescriptions   No medications on file  Previous Medications   APIXABAN (ELIQUIS) 2.5 MG TABS TABLET    Take 1 tablet (2.5 mg total) by mouth 2 (two) times daily.   CHOLECALCIFEROL (VITAMIN D) 2000 UNITS CAPS    Take 2,000 Units by mouth daily.   CLONAZEPAM (KLONOPIN) 0.5 MG TABLET    Take 1/2 to 1 tablet twice daily   DILTIAZEM (CARDIZEM CD) 120 MG 24 HR CAPSULE    Take 1 capsule (120 mg total) by mouth  daily.   FLECAINIDE (TAMBOCOR) 50 MG TABLET    Take 1 tablet (50 mg total) by mouth 2 (two) times daily.   FLUTICASONE-SALMETEROL (ADVAIR HFA) 115-21 MCG/ACT INHALER    Inhale 2 puffs into the lungs 2 (two) times daily as needed (WHEEZING AND SHORTNESS OF BREATH).    FUROSEMIDE (LASIX) 40 MG TABLET    Take 1 tablet (40 mg total) by mouth as needed for edema.   POLYETHYL GLYCOL-PROPYL GLYCOL (SYSTANE OP)    Place 1 drop into both eyes daily as needed (dry eyes).   SERTRALINE (ZOLOFT) 25 MG TABLET    Take 50 mg by mouth daily.   Modified Medications   No medications on file  Discontinued Medications   No medications on file

## 2015-12-03 ENCOUNTER — Telehealth: Payer: Self-pay

## 2015-12-03 NOTE — Telephone Encounter (Signed)
**Note De-Identified  Obfuscation** [name],        Please set up a 24 Hour Holter monitor on this patient for AFib.        Thanks         Ulice Dash    ----- Message -----     From: Noralee Space, MD     Sent: 12/02/2015  3:55 PM      To: Jettie Booze, MD

## 2015-12-03 NOTE — Telephone Encounter (Signed)
**Note De-identified  Obfuscation** LMTCB

## 2015-12-03 NOTE — Telephone Encounter (Signed)
I called the pt and she stated that her 24 hour holter monitor has already been ordered and scheduled to be placed on 7/17 at 2 pm here in this office by someone at Dr Jeannine Kitten office. I confirmed her appt date and time.

## 2015-12-03 NOTE — Telephone Encounter (Signed)
Follow Up:; ° ° °Returning your call. °

## 2015-12-09 ENCOUNTER — Other Ambulatory Visit: Payer: Self-pay | Admitting: Pulmonary Disease

## 2015-12-09 DIAGNOSIS — R9431 Abnormal electrocardiogram [ECG] [EKG]: Secondary | ICD-10-CM

## 2015-12-09 DIAGNOSIS — I480 Paroxysmal atrial fibrillation: Secondary | ICD-10-CM

## 2015-12-13 ENCOUNTER — Ambulatory Visit (INDEPENDENT_AMBULATORY_CARE_PROVIDER_SITE_OTHER): Payer: Medicare Other

## 2015-12-13 ENCOUNTER — Encounter (INDEPENDENT_AMBULATORY_CARE_PROVIDER_SITE_OTHER): Payer: Self-pay

## 2015-12-13 DIAGNOSIS — R9431 Abnormal electrocardiogram [ECG] [EKG]: Secondary | ICD-10-CM | POA: Diagnosis not present

## 2015-12-13 DIAGNOSIS — I480 Paroxysmal atrial fibrillation: Secondary | ICD-10-CM

## 2015-12-14 ENCOUNTER — Ambulatory Visit: Payer: Medicare Other | Admitting: Pulmonary Disease

## 2015-12-22 ENCOUNTER — Other Ambulatory Visit: Payer: Self-pay | Admitting: Pulmonary Disease

## 2015-12-22 DIAGNOSIS — I491 Atrial premature depolarization: Secondary | ICD-10-CM

## 2015-12-22 DIAGNOSIS — I480 Paroxysmal atrial fibrillation: Secondary | ICD-10-CM

## 2015-12-31 ENCOUNTER — Other Ambulatory Visit: Payer: Self-pay | Admitting: Internal Medicine

## 2015-12-31 DIAGNOSIS — Z1231 Encounter for screening mammogram for malignant neoplasm of breast: Secondary | ICD-10-CM

## 2016-01-03 ENCOUNTER — Institutional Professional Consult (permissible substitution): Payer: Medicare Other | Admitting: Cardiology

## 2016-01-12 NOTE — Progress Notes (Signed)
Electrophysiology Office Note   Date:  01/13/2016   ID:  [name], DOB [birth year], MRN [id number]  PCP:  Henrine Screws, MD  Cardiologist:  Christ Kick Primary Electrophysiologist:  Will Meredith Leeds, MD    Chief Complaint  Patient presents with  . New Patient (Initial Visit)    PAF, SVT     History of Present Illness: [name] is a 82 y.o. female who presents today for electrophysiology evaluation.   History of paroxysmal atrial fibrillation and elevated CHA2DS2 VASc score of 3, on chronic anticoagulation with Eliquis. Had cardioversion 08/31/15 which was unsuccessful.  Post cardioversion went into sinus rhythm for a short time with APCs.  The next 3 shocks failed to restore sinus rhythm.  She was started on flecainide 50 mg and diltiazem. She remains in atrial fibrillation today. She says that her symptoms are mainly of a fullness in her chest and neck. She does not have chest pain, but does have a small amount of shortness of breath.   Today, she denies symptoms of palpitations, chest pain, orthopnea, PND, lower extremity edema, claudication, dizziness, presyncope, syncope, bleeding, or neurologic sequela. The patient is tolerating medications without difficulties and is otherwise without complaint today.    Past Medical History:  Diagnosis Date  . Bronchiectasis ([place])    with multiple pneumonia back in 1990's  . Bronchitis    episodic  . DJD (degenerative joint disease) of knee   . GERD (gastroesophageal reflux disease)    episodic symptoms  . Hearing loss    chronic moderate hearing loss, worked up by ENT and hearing aids have been recommended  . History of nuclear stress test 11/07   NORMAL  . Insomnia   . PVC's (premature ventricular contractions)    on EKG, controlled on atenolol and caffeine reduction  . Tinnitus    chronic   Past Surgical History:  Procedure Laterality Date  . CARDIOVERSION N/A 08/31/2015   Procedure: CARDIOVERSION;  Surgeon: Jerline Pain, MD;  Location: [facility];  Service: Cardiovascular;  Laterality: N/A;  . cyst removed     left breast     Current Outpatient Prescriptions  Medication Sig Dispense Refill  . apixaban (ELIQUIS) 2.5 MG TABS tablet Take 1 tablet (2.5 mg total) by mouth 2 (two) times daily. 60 tablet 11  . Cholecalciferol (VITAMIN D) 2000 UNITS CAPS Take 2,000 Units by mouth daily.    . clonazePAM (KLONOPIN) 0.5 MG tablet Take 1/2 to 1 tablet twice daily 60 tablet 5  . diltiazem (CARDIZEM CD) 240 MG 24 hr capsule Take 1 capsule (240 mg total) by mouth daily. 90 capsule 3  . flecainide (TAMBOCOR) 100 MG tablet Take 1 tablet (100 mg total) by mouth 2 (two) times daily. 180 tablet 3  . fluticasone-salmeterol (ADVAIR HFA) 115-21 MCG/ACT inhaler Inhale 2 puffs into the lungs 2 (two) times daily as needed (WHEEZING AND SHORTNESS OF BREATH).     Marland Kitchen sertraline (ZOLOFT) 50 MG tablet Take 50 mg by mouth daily.     No current facility-administered medications for this visit.     Allergies:   Augmentin [amoxicillin-pot clavulanate]; Biaxin [clarithromycin]; Metoprolol; and Tequin [gatifloxacin]   Social History:  The patient  reports that she quit smoking about 47 years ago. Her smoking use included Cigarettes. She has a 10.00 pack-year smoking history. She has never used smokeless tobacco. She reports that she drinks alcohol. She reports that she does not use drugs.   Family History:  The patient's  family history includes Asthma in her sister; Clotting disorder in her sister; Hypertension in her father and mother; Stroke in her father and mother.    ROS:  Please see the history of present illness.   Otherwise, review of systems is positive for DOE, waking up at night SOB, SOB when lying down, palpitations, anxiety, balance issues.   All other systems are reviewed and negative.    PHYSICAL EXAM: VS:  BP 130/80   Pulse (!) 139   Ht 5\' 2"  (1.575 m)   Wt 116 lb 9.6 oz (52.9 kg)   BMI 21.33 kg/m  , BMI  Body mass index is 21.33 kg/m. GEN: Well nourished, well developed, in no acute distress  HEENT: normal  Neck: no JVD, carotid bruits, or masses Cardiac: RRR; no murmurs, rubs, or gallops,no edema  Respiratory:  clear to auscultation bilaterally, normal work of breathing GI: soft, nontender, nondistended, + BS MS: no deformity or atrophy  Skin: warm and dry Neuro:  Strength and sensation are intact Psych: euthymic mood, full affect  EKG:  EKG is ordered today. Personal review of the ekg ordered shows atrial fibrillation, rate 139, LAFB, septal Q waves  Recent Labs: 07/02/2015: TSH 2.443 07/03/2015: Magnesium 2.1 08/24/2015: Hemoglobin 14.3; Platelets 171 10/15/2015: Brain Natriuretic Peptide 54.2; BUN 10; Creat 0.64; Potassium 4.2; Sodium 141    Lipid Panel     Component Value Date/Time   CHOL 163 07/03/2015 0440   TRIG 73 07/03/2015 0440   HDL 57 07/03/2015 0440   CHOLHDL 2.9 07/03/2015 0440   VLDL 15 07/03/2015 0440   LDLCALC 91 07/03/2015 0440     Wt Readings from Last 3 Encounters:  01/13/16 116 lb 9.6 oz (52.9 kg)  12/02/15 119 lb 6.4 oz (54.2 kg)  11/04/15 112 lb 3.2 oz (50.9 kg)      Other studies Reviewed: Additional studies/ records that were reviewed today include: TTE 07/03/15  Review of the above records today demonstrates:  - Left ventricle: The cavity size was normal. Systolic function was   normal. The estimated ejection fraction was in the range of 55%   to 60%. Wall motion was normal; there were no regional wall   motion abnormalities.  Holter 12/13/15  NSR, intermittent junctional rhythm.  PACs, PVCs.  Minimum HR 30 at 4AM, presumably during sleep.  No atrial fibrillation.   ASSESSMENT AND PLAN:  1.  Parosysmal atrial fibrillation: on Eliquis, flecainide, diltiazem. She is currently on a low-dose of flecainide, and will therefore increase it to 100 mg twice daily. I have discussed with her the importance of being on her Eliquis for stroke  prevention. At this point, it does seem that a rhythm control strategy would be better, as she does have symptoms from her atrial fibrillation. We will therefore plan for cardioversion next week when she has been on the higher dose of flecainide. Her heart rate is also quite high in the office today. We'll therefore increase her diltiazem.  This patients CHA2DS2-VASc Score and unadjusted Ischemic Stroke Rate (% per year) is equal to 3.2 % stroke rate/year from a score of 3  Above score calculated as 1 point each if present [CHF, HTN, DM, Vascular=MI/PAD/Aortic Plaque, Age if 65-74, or Female] Above score calculated as 2 points each if present [Age > 75, or Stroke/TIA/TE]   Current medicines are reviewed at length with the patient today.   The patient does not have concerns regarding her medicines.  The following changes were made today:  Increase  diltiazem and flecainide  Labs/ tests ordered today include:  Orders Placed This Encounter  Procedures  . Basic metabolic panel  . CBC with Differential  . EKG 12-Lead     Disposition:   FU with Will Camnitz 3 months  Signed, Will Meredith Leeds, MD  01/13/2016 1:27 PM     [place] [address] [phone] (office) [phone] (fax)

## 2016-01-13 ENCOUNTER — Ambulatory Visit (INDEPENDENT_AMBULATORY_CARE_PROVIDER_SITE_OTHER): Payer: Medicare Other | Admitting: Cardiology

## 2016-01-13 ENCOUNTER — Encounter: Payer: Self-pay | Admitting: Cardiology

## 2016-01-13 VITALS — BP 130/80 | HR 139 | Ht 62.0 in | Wt 116.6 lb

## 2016-01-13 DIAGNOSIS — I4891 Unspecified atrial fibrillation: Secondary | ICD-10-CM | POA: Diagnosis not present

## 2016-01-13 LAB — CBC WITH DIFFERENTIAL/PLATELET
BASOS PCT: 1 %
Basophils Absolute: 63 cells/uL (ref 0–200)
EOS ABS: 126 {cells}/uL (ref 15–500)
Eosinophils Relative: 2 %
HEMATOCRIT: 43.9 % (ref 35.0–45.0)
Hemoglobin: 15 g/dL (ref 11.7–15.5)
LYMPHS PCT: 29 %
Lymphs Abs: 1827 cells/uL (ref 850–3900)
MCH: 29.2 pg (ref 27.0–33.0)
MCHC: 34.2 g/dL (ref 32.0–36.0)
MCV: 85.6 fL (ref 80.0–100.0)
MONO ABS: 567 {cells}/uL (ref 200–950)
MONOS PCT: 9 %
MPV: 9.7 fL (ref 7.5–12.5)
NEUTROS ABS: 3717 {cells}/uL (ref 1500–7800)
Neutrophils Relative %: 59 %
PLATELETS: 183 10*3/uL (ref 140–400)
RBC: 5.13 MIL/uL — AB (ref 3.80–5.10)
RDW: 14 % (ref 11.0–15.0)
WBC: 6.3 10*3/uL (ref 3.8–10.8)

## 2016-01-13 LAB — BASIC METABOLIC PANEL
BUN: 9 mg/dL (ref 7–25)
CALCIUM: 9.4 mg/dL (ref 8.6–10.4)
CO2: 22 mmol/L (ref 20–31)
Chloride: 107 mmol/L (ref 98–110)
Creat: 0.68 mg/dL (ref 0.60–0.88)
Glucose, Bld: 96 mg/dL (ref 65–99)
POTASSIUM: 4.1 mmol/L (ref 3.5–5.3)
SODIUM: 141 mmol/L (ref 135–146)

## 2016-01-13 MED ORDER — DILTIAZEM HCL ER COATED BEADS 240 MG PO CP24: 240.0000 mg | ORAL_CAPSULE | Freq: Every day | ORAL | 3 refills | Status: DC

## 2016-01-13 MED ORDER — FLECAINIDE ACETATE 100 MG PO TABS: 100.0000 mg | ORAL_TABLET | Freq: Two times a day (BID) | ORAL | 3 refills | Status: DC

## 2016-01-13 NOTE — Patient Instructions (Addendum)
Medication Instructions:  Your physician has recommended you make the following change in your medication:  1) Increase Flecainide to 100 mg twice daily 2) Increase Diltiazem to 240 mg daily    Labwork: Your physician recommends that you return for lab work today: BMP/CBC   Testing/Procedures: Your physician has recommended that you have a Cardioversion (DCCV). Electrical Cardioversion uses a jolt of electricity to your heart either through paddles or wired patches attached to your chest. This is a controlled, usually prescheduled, procedure. Defibrillation is done under light anesthesia in the hospital, and you usually go home the day of the procedure. This is done to get your heart back into a normal rhythm. You are not awake for the procedure. Please see the instruction sheet given to you today.---01/25/16  Please check in at the [facility] main Entrance of [place] at 11:00 Do not eat or drink after midnight the night prior to the procedure Okay to take your morning medications with a small sip of water Will need someone to drive you home after procedure  Follow-Up:  Your physician recommends that you schedule a follow-up appointment in: 3 months with Dr Curt Bears   Any Other Special Instructions Will Be Listed Below (If Applicable).     If you need a refill on your cardiac medications before your next appointment, please call your pharmacy.

## 2016-01-20 ENCOUNTER — Telehealth: Payer: Self-pay | Admitting: Cardiology

## 2016-01-20 NOTE — Telephone Encounter (Signed)
New message     Pt wants to know about the time for her cardioversion on Tuesday. Is it at 8:30 or at 1:00pm? Please call.

## 2016-01-20 NOTE — Telephone Encounter (Signed)
Advised patient to arrive at [facility] between 11:30 - 12:00 for DCCV at 1pm. She had lab work when in office on 8/17. Patient verbalized understanding and agreeable to plan.

## 2016-01-25 ENCOUNTER — Ambulatory Visit (HOSPITAL_COMMUNITY)
Admission: RE | Admit: 2016-01-25 | Discharge: 2016-01-25 | Disposition: A | Discharge status: Home / Self Care | Payer: Medicare Other | Source: Ambulatory Visit | Attending: Cardiology | Admitting: Cardiology

## 2016-01-25 ENCOUNTER — Encounter (HOSPITAL_COMMUNITY): Payer: Self-pay | Admitting: Anesthesiology

## 2016-01-25 ENCOUNTER — Encounter (HOSPITAL_COMMUNITY)
Admission: RE | Disposition: A | Discharge status: Home / Self Care | Payer: Self-pay | Source: Ambulatory Visit | Attending: Cardiology

## 2016-01-25 DIAGNOSIS — Z79899 Other long term (current) drug therapy: Secondary | ICD-10-CM | POA: Diagnosis not present

## 2016-01-25 DIAGNOSIS — Z5309 Procedure and treatment not carried out because of other contraindication: Secondary | ICD-10-CM | POA: Diagnosis not present

## 2016-01-25 DIAGNOSIS — I4891 Unspecified atrial fibrillation: Secondary | ICD-10-CM | POA: Diagnosis not present

## 2016-01-25 DIAGNOSIS — Z7901 Long term (current) use of anticoagulants: Secondary | ICD-10-CM | POA: Insufficient documentation

## 2016-01-25 SURGERY — CANCELLED PROCEDURE
Anesthesia: Monitor Anesthesia Care

## 2016-01-25 NOTE — Progress Notes (Signed)
Patient admitted to Endo unit and placed on monitor. Patient appeared to be in sinus bradycardia. EKG obtained that confirmed sinus brady. MD paged, but in procedure. NP confirmed Sinus brady and ok to d/c patient. Patient informed to continue to take medications as ordered and to keep follow up. If MD wanted to see patient before scheduled follow up the office will call. Patient informed to continue to check pulse for regularity and to call MD office if it felt irregular or pulse was above 100.

## 2016-01-25 NOTE — Anesthesia Preprocedure Evaluation (Deleted)
Anesthesia Evaluation  Patient identified by MRN, date of birth, ID band Patient awake    Reviewed: Allergy & Precautions, NPO status , Patient's Chart, lab work & pertinent test results  Airway        Dental   Pulmonary COPD, former smoker,  Bronchiectasis           Cardiovascular + DOE  + dysrhythmias (PVCs) Atrial Fibrillation   Carotid stenosis  Exercise Tolerance Test 09/06/2015: Negative stress test without evidence of ischemia at given workload. Prior to exercise, there was a PVC, followed several beats later by a short run of a-fib with RVR. (known diagnosis).  06/2015 Echo Study Conclusions  - Left ventricle: The cavity size was normal. Systolic function was normal. The estimated ejection fraction was in the range of 55% to 60%. Wall motion was normal; there were no regional wall motion abnormalities.   Neuro/Psych    GI/Hepatic GERD  ,  Endo/Other    Renal/GU      Musculoskeletal  (+) Arthritis ,   Abdominal   Peds  Hematology   Anesthesia Other Findings Hearing loss, insomnia  Reproductive/Obstetrics                             Anesthesia Physical Anesthesia Plan  ASA: III  Anesthesia Plan: MAC   Post-op Pain Management:    Induction: Intravenous  Airway Management Planned: Natural Airway and Nasal Cannula  Additional Equipment:   Intra-op Plan:   Post-operative Plan:   Informed Consent:   Plan Discussed with:   Anesthesia Plan Comments:         Anesthesia Quick Evaluation

## 2016-01-25 NOTE — H&P (Signed)
Patient presented to the hospital in sinus rhythm.  No cardioversion performed. Patient discharged home to follow up in clinic.  Allegra Lai, MD

## 2016-02-14 ENCOUNTER — Ambulatory Visit
Admission: RE | Admit: 2016-02-14 | Discharge: 2016-02-14 | Disposition: A | Discharge status: Home / Self Care | Payer: Medicare Other | Source: Ambulatory Visit | Attending: Internal Medicine | Admitting: Internal Medicine

## 2016-02-14 DIAGNOSIS — Z1231 Encounter for screening mammogram for malignant neoplasm of breast: Secondary | ICD-10-CM

## 2016-02-18 ENCOUNTER — Encounter: Payer: Self-pay | Admitting: Cardiology

## 2016-02-20 NOTE — Progress Notes (Signed)
Electrophysiology Office Note   Date:  02/21/2016   ID:  SHAQUESHA KARL, DOB 11/29/1930, MRN [id number]  PCP:  Henrine Screws, MD  Cardiologist:  Christ Kick Primary Electrophysiologist:  Maleka Contino Meredith Leeds, MD    Chief Complaint  Patient presents with  . Follow-up    afib     History of Present Illness: [name] is a 82 y.o. female who presents today for electrophysiology evaluation.   History of paroxysmal atrial fibrillation and elevated CHA2DS2 VASc score of 3, on chronic anticoagulation with Eliquis. Had cardioversion 08/31/15 which was unsuccessful.  Flecainide was increased to 100 mg BID and she had attempted repeat cardioversion but was in sinus rhythm when she showed up to the hospital. She says that over the last week and a half, she has felt an uncomfortable pounding sensation in her chest. It has been increasing over the last week and a half. It is keeping her from sleeping, and she has been mainly trying to sleep in the chair. She also been having some lower extremity edema but started a few weeks ago left greater than right.  Today, she denies symptoms of palpitations, chest pain, orthopnea, PND, lower extremity edema, claudication, dizziness, presyncope, syncope, bleeding, or neurologic sequela. The patient is tolerating medications without difficulties and is otherwise without complaint today.    Past Medical History:  Diagnosis Date  . Bronchiectasis ([place])    with multiple pneumonia back in 1990's  . Bronchitis    episodic  . DJD (degenerative joint disease) of knee   . GERD (gastroesophageal reflux disease)    episodic symptoms  . Hearing loss    chronic moderate hearing loss, worked up by ENT and hearing aids have been recommended  . History of nuclear stress test 11/07   NORMAL  . Insomnia   . PVC's (premature ventricular contractions)    on EKG, controlled on atenolol and caffeine reduction  . Tinnitus    chronic   Past Surgical History:    Procedure Laterality Date  . CARDIOVERSION N/A 08/31/2015   Procedure: CARDIOVERSION;  Surgeon: Jerline Pain, MD;  Location: [facility];  Service: Cardiovascular;  Laterality: N/A;  . cyst removed     left breast     Current Outpatient Prescriptions  Medication Sig Dispense Refill  . apixaban (ELIQUIS) 2.5 MG TABS tablet Take 1 tablet (2.5 mg total) by mouth 2 (two) times daily. 60 tablet 11  . Cholecalciferol (VITAMIN D) 2000 UNITS CAPS Take 2,000 Units by mouth daily.    . clonazePAM (KLONOPIN) 0.5 MG tablet Take 1/2 to 1 tablet twice daily 60 tablet 5  . diltiazem (CARDIZEM CD) 120 MG 24 hr capsule Take 1 capsule (120 mg total) by mouth daily. 30 capsule 6  . flecainide (TAMBOCOR) 100 MG tablet Take 1 tablet (100 mg total) by mouth 2 (two) times daily. 180 tablet 3  . fluticasone-salmeterol (ADVAIR HFA) 115-21 MCG/ACT inhaler Inhale 2 puffs into the lungs 2 (two) times daily as needed (WHEEZING AND SHORTNESS OF BREATH).     . furosemide (LASIX) 40 MG tablet Take 40 mg by mouth daily as needed.    . sertraline (ZOLOFT) 50 MG tablet Take 50 mg by mouth daily.     No current facility-administered medications for this visit.     Allergies:   Augmentin [amoxicillin-pot clavulanate]; Biaxin [clarithromycin]; Metoprolol; and Tequin [gatifloxacin]   Social History:  The patient  reports that she quit smoking about 47 years ago. Her smoking use included  Cigarettes. She has a 10.00 pack-year smoking history. She has never used smokeless tobacco. She reports that she drinks alcohol. She reports that she does not use drugs.   Family History:  The patient's family history includes Asthma in her sister; Clotting disorder in her sister; Hypertension in her father and mother; Stroke in her father and mother.    ROS:  Please see the history of present illness.   Otherwise, review of systems is positive for leg swelling, palpitations, DOE, anxiety, balance problems.   All other systems are reviewed  and negative.    PHYSICAL EXAM: VS:  BP 130/74   Pulse (!) 53   Ht 5\' 2"  (1.575 m)   Wt 117 lb (53.1 kg)   BMI 21.40 kg/m  , BMI Body mass index is 21.4 kg/m. GEN: Well nourished, well developed, in no acute distress  HEENT: normal  Neck: no JVD, carotid bruits, or masses Cardiac: RRR; no murmurs, rubs, or gallops, 1-2+ edema left leg Respiratory:  clear to auscultation bilaterally, normal work of breathing GI: soft, nontender, nondistended, + BS MS: no deformity or atrophy  Skin: warm and dry Neuro:  Strength and sensation are intact Psych: euthymic mood, full affect  EKG:  EKG is ordered today. Personal review of the ekg ordered shows junctional rhythm, rate 53  Recent Labs: 07/02/2015: TSH 2.443 07/03/2015: Magnesium 2.1 10/15/2015: Brain Natriuretic Peptide 54.2 01/13/2016: BUN 9; Creat 0.68; Hemoglobin 15.0; Platelets 183; Potassium 4.1; Sodium 141    Lipid Panel     Component Value Date/Time   CHOL 163 07/03/2015 0440   TRIG 73 07/03/2015 0440   HDL 57 07/03/2015 0440   CHOLHDL 2.9 07/03/2015 0440   VLDL 15 07/03/2015 0440   LDLCALC 91 07/03/2015 0440     Wt Readings from Last 3 Encounters:  02/21/16 117 lb (53.1 kg)  01/13/16 116 lb 9.6 oz (52.9 kg)  12/02/15 119 lb 6.4 oz (54.2 kg)      Other studies Reviewed: Additional studies/ records that were reviewed today include: TTE 07/03/15  Review of the above records today demonstrates:  - Left ventricle: The cavity size was normal. Systolic function was   normal. The estimated ejection fraction was in the range of 55%   to 60%. Wall motion was normal; there were no regional wall   motion abnormalities.  Holter 12/13/15  NSR, intermittent junctional rhythm.  PACs, PVCs.  Minimum HR 30 at 4AM, presumably during sleep.  No atrial fibrillation.   ASSESSMENT AND PLAN:  1.  Parosysmal atrial fibrillation: on Eliquis, flecainide, diltiazem. She is currently on a low-dose of flecainide, and Denys Salinger therefore  increase it to 100 mg twice daily. I have discussed with her the importance of being on her Eliquis for stroke prevention. Attempted cardioversion but was in sinus rhythm at presentation. Today she is in junctional rhythm, and having symptoms of palpitations. Due to the junctional rhythm we Milledge Gerding decrease her diltiazem to 160 mg. We'll also get a 48-hour monitor to further determine if she is having any abnormal rhythms could be causing her palpitations.  This patients CHA2DS2-VASc Score and unadjusted Ischemic Stroke Rate (% per year) is equal to 3.2 % stroke rate/year from a score of 3  Above score calculated as 1 point each if present [CHF, HTN, DM, Vascular=MI/PAD/Aortic Plaque, Age if 65-74, or Female] Above score calculated as 2 points each if present [Age > 75, or Stroke/TIA/TE]  2. LE edema: Kweli Grassel get lower 70 Dopplers to rule out DVT.  If negative Dequante Tremaine give a short course of Lasix.  Current medicines are reviewed at length with the patient today.   The patient has concerns regarding her medicines.  The following changes were made today:  Decrease diltiazem  Labs/ tests ordered today include:  Orders Placed This Encounter  Procedures  . Holter monitor - 48 hour  . EKG 12-Lead  . EKG 12-Lead     Disposition:   FU with Haelyn Forgey 3 months  Signed, Phiona Ramnauth Meredith Leeds, MD  02/21/2016 11:35 AM     [facility] [address] [place] [address] [phone] (office) [phone] (fax)

## 2016-02-21 ENCOUNTER — Ambulatory Visit (INDEPENDENT_AMBULATORY_CARE_PROVIDER_SITE_OTHER): Payer: Medicare Other | Admitting: Cardiology

## 2016-02-21 ENCOUNTER — Encounter: Payer: Self-pay | Admitting: Cardiology

## 2016-02-21 VITALS — BP 130/74 | HR 53 | Ht 62.0 in | Wt 117.0 lb

## 2016-02-21 DIAGNOSIS — R600 Localized edema: Secondary | ICD-10-CM | POA: Diagnosis not present

## 2016-02-21 DIAGNOSIS — I480 Paroxysmal atrial fibrillation: Secondary | ICD-10-CM | POA: Diagnosis not present

## 2016-02-21 DIAGNOSIS — R002 Palpitations: Secondary | ICD-10-CM | POA: Diagnosis not present

## 2016-02-21 MED ORDER — DILTIAZEM HCL ER COATED BEADS 120 MG PO CP24: 120.0000 mg | ORAL_CAPSULE | Freq: Every day | ORAL | 6 refills | Status: DC

## 2016-02-21 NOTE — Patient Instructions (Addendum)
Medication Instructions:   Your physician has recommended you make the following change in your medication:  1) DECREASE Diltiazem to 120 mg once daily  --- If you need a refill on your cardiac medications before your next appointment, please call your pharmacy. ---  Labwork:  None ordered  Testing/Procedures:  Your physician has requested that you have lower extremity doppler study on your legs.   Your physician has recommended that you wear a 48 hour holter monitor. Holter monitors are medical devices that record the heart's electrical activity. Doctors most often use these monitors to diagnose arrhythmias. Arrhythmias are problems with the speed or rhythm of the heartbeat. The monitor is a small, portable device. You can wear one while you do your normal daily activities. This is usually used to diagnose what is causing palpitations/syncope (passing out).  Follow-Up:  Your physician recommends that you schedule a follow-up appointment in: 1 week for nurse visit EKG.   Your physician recommends that you schedule a follow-up appointment in: 3 months with Dr. Curt Bears.  Thank you for choosing [facility]!!   Trinidad Curet, RN [phone]  Any Other Special Instructions Will Be Listed Below (If Applicable).  Holter Monitoring A Holter monitor is a small device that is used to detect abnormal heart rhythms. It clips to your clothing and is connected by wires to flat, sticky disks (electrodes) that attach to your chest. It is worn continuously for 24-48 hours. HOME CARE INSTRUCTIONS  Wear your Holter monitor at all times, even while exercising and sleeping, for as long as directed by your health care provider.  Make sure that the Holter monitor is safely clipped to your clothing or close to your body as recommended by your health care provider.  Do not get the monitor or wires wet.  Do not put body lotion or moisturizer on your chest.  Keep your skin clean.  Keep a  diary of your daily activities, such as walking and doing chores. If you feel that your heartbeat is abnormal or that your heart is fluttering or skipping a beat:  Record what you are doing when it happens.  Record what time of day the symptoms occur.  Return your Holter monitor as directed by your health care provider.  Keep all follow-up visits as directed by your health care provider. This is important. SEEK IMMEDIATE MEDICAL CARE IF:  You feel lightheaded or you faint.  You have trouble breathing.  You feel pain in your chest, upper arm, or jaw.  You feel sick to your stomach and your skin is pale, cool, or damp.  You heartbeat feels unusual or abnormal.   This information is not intended to replace advice given to you by your health care provider. Make sure you discuss any questions you have with your health care provider.   Document Released: 02/11/2004 Document Revised: 06/05/2014 Document Reviewed: 12/22/2013 Elsevier Interactive Patient Education [employer].

## 2016-02-23 ENCOUNTER — Ambulatory Visit (INDEPENDENT_AMBULATORY_CARE_PROVIDER_SITE_OTHER): Payer: Medicare Other | Admitting: Pharmacist

## 2016-02-23 ENCOUNTER — Ambulatory Visit (HOSPITAL_COMMUNITY)
Admission: RE | Admit: 2016-02-23 | Discharge: 2016-02-23 | Disposition: A | Discharge status: Home / Self Care | Payer: Medicare Other | Source: Ambulatory Visit | Attending: Cardiology | Admitting: Cardiology

## 2016-02-23 VITALS — Wt 115.7 lb

## 2016-02-23 DIAGNOSIS — Z87891 Personal history of nicotine dependence: Secondary | ICD-10-CM | POA: Diagnosis not present

## 2016-02-23 DIAGNOSIS — J449 Chronic obstructive pulmonary disease, unspecified: Secondary | ICD-10-CM | POA: Insufficient documentation

## 2016-02-23 DIAGNOSIS — R600 Localized edema: Secondary | ICD-10-CM | POA: Insufficient documentation

## 2016-02-23 DIAGNOSIS — I4891 Unspecified atrial fibrillation: Secondary | ICD-10-CM | POA: Diagnosis not present

## 2016-02-23 LAB — CBC
HEMATOCRIT: 40.1 % (ref 35.0–45.0)
Hemoglobin: 13.6 g/dL (ref 11.7–15.5)
MCH: 29.7 pg (ref 27.0–33.0)
MCHC: 33.9 g/dL (ref 32.0–36.0)
MCV: 87.6 fL (ref 80.0–100.0)
MPV: 10 fL (ref 7.5–12.5)
PLATELETS: 172 10*3/uL (ref 140–400)
RBC: 4.58 MIL/uL (ref 3.80–5.10)
RDW: 14.9 % (ref 11.0–15.0)
WBC: 4.9 10*3/uL (ref 3.8–10.8)

## 2016-02-23 NOTE — Progress Notes (Signed)
Pt was started on Eliquis for atrial fibrillation on 07/03/2015 and presents for 6 month follow up. Of note, she also had a doppler performed today d/t lower extremity swelling in her left leg - negative for DVT.  Reviewed patients medication list.  Pt is not currently on any combined P-gp and strong CYP3A4 inhibitors/inducers (ketoconazole, traconazole, ritonavir, carbamazepine, phenytoin, rifampin, St. John's wort).  Reviewed labs.  Age 82, SCr 0.75, Weight 52.5kg.  Dose appropriate based on age, weight, and SCr.  Hgb and HCT stable at 13.6 and 40.1, respectively.   A full discussion of the nature of anticoagulants has been carried out.  A benefit/risk analysis has been presented to the patient, so that they understand the justification for choosing anticoagulation with Eliquis at this time.  The need for compliance is stressed.  Pt is aware to take the medication twice daily.  Side effects of potential bleeding are discussed, including unusual colored urine or stools, coughing up blood or coffee ground emesis, nose bleeds or serious fall or head trauma.  Discussed signs and symptoms of stroke. The patient should avoid any OTC items containing aspirin or ibuprofen.  Avoid alcohol consumption.   Call if any signs of abnormal bleeding.  Discussed financial obligations and resolved any difficulty in obtaining medication.

## 2016-02-24 LAB — BASIC METABOLIC PANEL
BUN: 15 mg/dL (ref 7–25)
CHLORIDE: 103 mmol/L (ref 98–110)
CO2: 25 mmol/L (ref 20–31)
CREATININE: 0.75 mg/dL (ref 0.60–0.88)
Calcium: 8.7 mg/dL (ref 8.6–10.4)
GLUCOSE: 81 mg/dL (ref 65–99)
POTASSIUM: 3.9 mmol/L (ref 3.5–5.3)
Sodium: 143 mmol/L (ref 135–146)

## 2016-02-28 ENCOUNTER — Ambulatory Visit (INDEPENDENT_AMBULATORY_CARE_PROVIDER_SITE_OTHER): Payer: Medicare Other

## 2016-02-28 DIAGNOSIS — R002 Palpitations: Secondary | ICD-10-CM | POA: Diagnosis not present

## 2016-02-28 DIAGNOSIS — I480 Paroxysmal atrial fibrillation: Secondary | ICD-10-CM | POA: Diagnosis not present

## 2016-03-01 ENCOUNTER — Ambulatory Visit (INDEPENDENT_AMBULATORY_CARE_PROVIDER_SITE_OTHER): Payer: Medicare Other | Admitting: *Deleted

## 2016-03-01 ENCOUNTER — Other Ambulatory Visit: Payer: Self-pay | Admitting: Cardiology

## 2016-03-01 ENCOUNTER — Encounter: Payer: Self-pay | Admitting: *Deleted

## 2016-03-01 VITALS — BP 138/72 | HR 74 | Resp 16 | Ht 62.0 in

## 2016-03-01 DIAGNOSIS — R002 Palpitations: Secondary | ICD-10-CM | POA: Diagnosis not present

## 2016-03-01 DIAGNOSIS — I480 Paroxysmal atrial fibrillation: Secondary | ICD-10-CM

## 2016-03-01 MED ORDER — AMIODARONE HCL 200 MG PO TABS: ORAL_TABLET | ORAL | 2 refills | Status: DC

## 2016-03-01 MED ORDER — AMIODARONE HCL 400 MG PO TABS: 400.0000 mg | ORAL_TABLET | Freq: Two times a day (BID) | ORAL | 11 refills | Status: DC

## 2016-03-01 NOTE — Addendum Note (Signed)
Addended by: Stanton Kidney on: 03/01/2016 10:04 AM   Modules accepted: Orders

## 2016-03-01 NOTE — Progress Notes (Signed)
1.) Reason for visit: EKG to f/u medication change  2.) Name of MD requesting visit: Dr Curt Bears  3.) H&P: Hx of PAF, recent change to decrease Diltiazem to 120 mg.    4.) ROS related to problem: feeling palpitations.  5.) Assessment and plan per MD: Reviewed EKG with Dr Curt Bears who d/c flecainide and Diltiazem.  Started Amiodarone 400 mg twice a day for 2 weeks, then 400 mg once a day for 2 weeks then 200 mg daily.  She is to f/u with him in 1 month.  Rx sent into pharmacy and appt scheduled to f/u.

## 2016-03-01 NOTE — Patient Instructions (Signed)
Medication Instructions:  Please stop your Diltiazem and your Flecainide.  Start Amiodarone 400 mg twice a day for 2 weeks, then take 400 mg once a day for 2 weeks then decrease to 200 mg daily. Continue all other medications as listed.  Follow-Up: Follow up with Dr Horace Porteous in 1 month.  If you need a refill on your cardiac medications before your next appointment, please call your pharmacy.  Thank you for choosing [facility]!!

## 2016-03-07 ENCOUNTER — Ambulatory Visit (INDEPENDENT_AMBULATORY_CARE_PROVIDER_SITE_OTHER): Payer: Medicare Other | Admitting: Pulmonary Disease

## 2016-03-07 ENCOUNTER — Encounter: Payer: Self-pay | Admitting: Pulmonary Disease

## 2016-03-07 VITALS — BP 112/64 | HR 46 | Temp 97.8°F | Ht 62.0 in | Wt 113.2 lb

## 2016-03-07 DIAGNOSIS — R002 Palpitations: Secondary | ICD-10-CM

## 2016-03-07 DIAGNOSIS — J479 Bronchiectasis, uncomplicated: Secondary | ICD-10-CM | POA: Diagnosis not present

## 2016-03-07 DIAGNOSIS — J449 Chronic obstructive pulmonary disease, unspecified: Secondary | ICD-10-CM

## 2016-03-07 DIAGNOSIS — I480 Paroxysmal atrial fibrillation: Secondary | ICD-10-CM | POA: Diagnosis not present

## 2016-03-07 DIAGNOSIS — R0600 Dyspnea, unspecified: Secondary | ICD-10-CM | POA: Diagnosis not present

## 2016-03-07 NOTE — Patient Instructions (Signed)
Today we updated your med list in our EPIC system...    Continue your current medications the same...  We discussed using the KLONOPIN 1/2 tab as needed for the shortness of breath...    You may increase this to 1/2 tab twice daily if needed for on-going symptoms...  Call for any questions...  Let's plan a follow up visit in 50mo, sooner if needed for problems.Marland KitchenMarland Kitchen

## 2016-03-07 NOTE — Progress Notes (Signed)
Subjective:     Patient ID: [name], female   DOB: 1930-07-22, 82 y.o.   MRN: [id number]  HPI    82 y/o WF referred for dyspnea> she has bronchiectasis/ scarring, mod COPD w/ a revers component, NEG Quant Gold assay, and sput w/ Pseudomonas x1 & NEG AFB/ Fungi;  She is followed by [facility] for Cards w/ PAFib...   ~  December 28, 2014:  Initial pulmonary consult by SN>   49 y/o WF, referred by Dr. Mertha Finders, for evaluation of dyspnea>>       [name] tells me that she was diagnosed w/ bronchiectasis ~15 yrs ago- notes that she had whooping cough as a child (but no known sequelae in childhood) and pneumonia x3 as an adult (in the 1990s she says);  She is not sure of her early course w/ her lung dis & records in Epic show old films from [place] office back to 2003 w/ similar features to current CXRs=> biapical pleuroparenchymal scarring with a +/- stable pattern;  CT Angiogram done 02/2006 showed neg for PE, biapical pleuroparenchymal scarring w/ fibronodular changes in RUL, RML, Lingula felt to represent likely post infectious scarring and NAD;  CXRs done 11/10/14 and 02/20/14 showed sl hyperinflation, flattening of diaphragms, stable biapical & upper lobe scarring, normal heart size, NAD...       Symptomatically she notes SOB/DOE- breathing harder when walking fast or exerting, going on for about 42yr & not really progressive (fairly stable dyspnea), ADLs are OK, prev walked on treadmill for 16min about 76yr ago but now not going to the gym;  She notes some chest tightness, a choking sensation in her throat, hard to get the air "IN" and a yawn seems to help temporarily;  She notes mild cough, sm amt green=>beige sput, w/o hemoptysis/ f/c/s... She is an ex-smoker having started in her teens, smoked off & on, up to 1/2 ppd, and quit in the 1980s- calc about a 15 pack-year smoking hx...       She is followed for CARDS by DrVaranasi> Hx CP & palpit w/ neg stress test and neg cath 2010; ?lifeline screen  showed Afib but never confirmed (also showed mild carotid dis); notes SOB, choking, palpit, & Holter in 2014 showed PACs & given Lorazepam by PCP w/ some relief; DrV checks her yearly- on ASA81 and Metop25Bid...       Current meds> Metop25Bid, Lorazepam0.5 prn, VitD supplement... EXAM reveals Afeb, VSS, O2sat=97% on RA;  HEENT- neg, mallampati1;  Chest- decr BS bilat, clear w/o w/r/r;  Heart- RR, gr1/6 SEM w/o r/g;  Abd- soft, neg;  Ext- neg w/o c/c/e...  Old CXRs in PACS> 2003/2007 showed biapical pleuroparenchymal scarring with a +/- stable pattern.   CT Angiogram done 02/2006 showed neg for PE, biapical pleuroparenchymal scarring w/ fibronodular changes in RUL, RML, lingula felt to represent likely post infectious scarring and NAD.   CXRs done 11/10/14 and 02/20/14 showed sl hyperinflation, flattening of diaphragms, stable biapical & upper lobe scarring, normal heart size, NAD (osteopenia & mid Tspine compression)...   PFT 11/20/14 showed FVC=1.80 (78%), FEV1=0.90 (53%), %1sec=50, mid-flows reduced at 31% predicted; post bronchodil there was a 19% improvement in FEV1; LungVols- TLC=4.66 (98%), RV=2.73 (117%), RV/TLC=59%; DLCO is reduced at 43% predicted...   LABS 12/28/14:  We checked BMet- wnl;  CBC- wnl w/ 2% eos;  Sed=16;  Quantiferon GOLD= NEG...  Hi-res CT Chest => sched for 01/05/15 & pending     IMP/PLAN>>  27 y/o woman  w/ mild smoking hx & quit 30 yrs ago; she has mod obstructive lung dis on PFT w/ reversible component AND decr DLCO; Labs appear neg & QuantGold is neg as well- still could have underlying bronchiectasis & poss atypic mycobactium, awaiting Hi-res CT Chest, consider getting sput for cult/ AFB; In any event her parenchymal dis does not appear to be very progressive;  REC- trial SYMBICORT160- 2sp Bid and we will recheck pt in 4-6 weeks...   ADDENDUM>>  CT Chest 01/05/15 showed norm heart size, atherosclerosis, sl dilated PA trunk at 3.5cm suggestion PAH, no adenopathy;  Lungs show  bilat patchy bronchiectasis & interstitial thickening w/ peribronchvasc nodularity (sl worse than 2007) and no ground glass, reticulation, honeycombing => we will check sput specimen for cult & AFB.Marland Kitchen. (2DEcho 06/2015 w/ PAsys=19) ADDENDUM>>  Sputum C&S 01/15/15 was pos for Pseudomonas (resist to Rocephin, sens to Cipro etc)- likely colonizer;  AFB smears are neg & culture pending...   ~  February 08, 2015:  6wk ROV w/ SN>  [name] reports that her breathing is improved, less SOB, notes that she walked a lot over the weekend at the [place] did well;  She denies much cough, sput, no hemoptysis, no CP, no f/c/s... She remains on Advair115-2spBid and Lorazepam 0.5mg  prn...     Mod obstructive lung dis w/ a revers component & GOLD Stage3 COPD on PFTs> on ADVAIR115-2spBid & she is improved w/ incr exercise tolerance    Bronchiectasis & pleuroparenchymal scarring on CT, r/o MAI infection/ Lady [place] syndrome> AFB cult is neg x1    Pseudomonas colonization> the pseudomonas is sens to Cipro... EXAM reveals Afeb, VSS, O2sat=97% on RA;  HEENT- neg, mallampati1;  Chest- decr BS bilat, clear w/o w/r/r;  Heart- RR, gr1/6 SEM w/o r/g;  Abd- soft, neg;  Ext- neg w/o c/c/e... IMP/PLAN>>  Eulia is stable=> improved, asked to continue the Advair115-2spBid; she has already had the 2016 Flu vaccine; we plan ROV in 3-4 moths, sooner if needed...   ~  June 10, 2015:  18mo ROV w/ SN>  [name] remains stable- breathing is good, no prob w/ winter weather so far, she remains active & denies cough, sput, hemoptysis; she has chr stable DOE & trying to exercise; she wonders if the Advair250-DPI inhaler would be better for her & we gave her a sample 7 new Rx so she can decide...      Mod obstructive lung dis w/ a revers component & GOLD Stage3 COPD on PFTs> on ADVAIR115-2spBid & she is improved w/ incr exercise tolerance    Bronchiectasis & pleuroparenchymal scarring on CT, r/o MAI infection/ Lady [place] syndrome> AFB  cult is neg x1    Pseudomonas colonization> the pseudomonas is sens to Cipro... EXAM reveals Afeb, VSS, O2sat=96% on RA;  HEENT- neg, mallampati1;  Chest- decr BS bilat, clear w/o w/r/r;  Heart- RR, gr1/6 SEM w/o r/g;  Abd- soft, neg;  Ext- neg w/o c/c/e... IMP/PLAN>>  Christee remains stable-- she will decide if she prefers the MDI vs the DPI inhaletr; asked to incr exercise program, call for any problems and we plan routine f/u 25mo...   ~  November 04, 2015:  22mo ROV w/ SN>  Helga has had a lot of difficulty in the interval- AFib w/ rvr Feb2017 and Adm 1d for IV Cardizem drip, converted to NSR & disch on Metop50Bid & Eliquis2.5Bid;  Then she developed recurrent AFib on 08/31/15 & failed DCCV=> placed on Flecainide & converted to NSR;  She has followed up w/ DrVaranasi 10/15/15 on Flecainide50Bid, CardizemCD120, Eliquis2.5Bid and Lasix40 prn; Labs were OK, BNP=54 and told to continue same...     She presents today w/ a ?2wk hx of SOB, ?choking sensation, heavy feeling in upper chest/throat area- hard for her to describe; it is intermittent- ?when noted, ?ppt, nothing relieves it;  She stopped her Advair several weeks ago on her own therefore restarted recently;  She denies CP or palp sensation, denies dizzy/ lightheaded/ syncope;  She denies cough/ sput/ hemoptysis/ etc;  When asked to describe her SOB further- she cannot, asked if it's hard to get the air "in" or "out" she says both;  Daughter adds that she's been under a lot of stress and quite anxious- ?had a reaction to Lorazepam months ago & DrGates switched her to Zoloft;  Exam today showed sl irreg HR and EKGs showed NSR but sinus pauses & then ?escape beats=> she did not sense these episodes on the EKG machine (not c/o dizzy/ lightheaded/ palpit/ etc)...    Mod obstructive lung dis w/ a revers component & GOLD Stage3 COPD on PFTs> on ADVAIR115-2spBid & she is improved w/ incr exercise tolerance    Bronchiectasis & pleuroparenchymal scarring on CT, r/o MAI  infection/ Lady [place] syndrome> QuantGOLD is neg, AFB cult is neg x1    Pseudomonas colonization> the pseudomonas was sens to Cipro...    PAF>  eval & Rx per DrVaranasi on Flecainide50Bid, CardizemCD120, Eliquis2.5Bid and Lasix40 prn; EKG today shows NSR but assoc w/ few pauses (~1.5sec, HR down to ~40) EXAM reveals Afeb, VSS, O2sat=96% on RA;  HEENT- neg, mallampati1;  Chest- decr BS bilat, clear w/o w/r/r;  Heart- sl irregularity, gr1/6 SEM w/o r/g;  Abd- soft, neg;  Ext- neg w/o c/c/e...  EKG 07/02/15 w/ AFib & rvr, rate=130, poor R progression V1-3...  2DEcho 07/02/15>  Norm LV size & function w/ EF=55-60%, no regional wall motion abn, norm AoV, essentially norm MV w/ trivMR, norm LA/RA, norm RV w/ RVsys=19...  EKG after chemical cardioversion> NSR, rate=80, RAE, poor R prograssion V1-3  CXR 08/24/15 showed norm heart size, hyperexpansion w/ scarring both upper lobes & nodular opac in periph LUL, degen changes in Tspine...  EKG today 11/04/15>  NSR w/ sinus pauses up to 1.5sec documented (HR~40 in that interval)RAE, poor R progression V1-3... IMP/PLAN>>  I am not sure what is going on here; she is certainly quite anxious but puts up a good front for daughter; she does NOT describe the dyspnea as the classic "can't get a DB, can't get the air IN", but has a discomfort ?choking sensation in throat/ upper chest (?globus)- btw she denies any heartburn symptoms;  I'm concerned the symptoms could be a manifestation of an arrhythmia & I'm inclined to suggest a HOLTER monitor for further eval but I'd like to discuss w/ DrVaranasi first; she is NOT on a Benzo- just Zoloft now- & I am inclined to start KLONOPIN 0.5mg  tabs- 1/2 to 1 tab PO Bid...we plan ROV recheck in 49mo...  ~  December 02, 2015:  31mo ROV & Madolyn reports a prompt and complete response to the [facility] 0.5mg - 1/2Bid; this has eliminated her choking sensation, chest discomfort, etc and she is now resting well at night, awakes refreshed & feeling  better; she continues to deny palpit, dizziness, lightheadedness, near syncope, etc...  EXAM shows Afeb, VSS, O2sat=96% on RA;  HEENT- neg, mallampati1;  Chest- decr BS bilat, clear w/o w/r/r;  Heart- sl irregularity, gr1/6  SEM w/o r/g;  Abd- soft, neg;  Ext- neg w/o c/c/e...  EKG today 12/02/15> ?SBrady, rate 43, some variability in p wave, poor R prog V1-2... Longest pause=1.7sec on this EKG. IMP/PLAN>>  Teshawn is feeling much better on the Klonopin & we will continue same;  I am still concerned about the arryhthmia, long r-r interval, slow HR, p-wave morphology differs => decided to proceed w/ HOLTER MONITOR to be sure she is not having any more serious pauses/ arrhythmias...   ADDENDUM>>  48H Holter 12/13/15 showed NSR, intermittent junctional rhythm., PACs, PVCs. Minimum HR 30 at 4AM, No atrial fibrillation.=> referred to EP... Pt seen by [facility] 01/13/16>  Hx PAF on Eliquis, unsuccessful cardioversion 08/2015, treated w/ Flecainide & Diltiazem, Flec dose increased & she converted to NSR=> junctional rhythm & repeat Holter 02/28/16 showed NSR, PVCs, PACs, occas juntional rhythm & 4.8 sec pause while sleeping; palpit were due to bigeminy... They decided to STOP the Flecainide & diltiazem & start AMIODARONE...   ~  March 07, 2016:  63mo ROV w/ SN>  Madalyne continues to do well on the Klonopin- breathing well now & just using 1/2 of the Klonopin prn (taking 3/7 days on ave)... She has been eval & followed by [facility] for EP- see above, now on AMIO;  She has a f/u appt w/ PCP- DrGates soon for a CPX & will get her FLU vaccine there... We reviewed the following medical problems during today's office visit >>     Dyspnea- multifactorial>  This improved on Klonopin trial taking 0.5mg - 1/2 to 1 tab bid...    Mod obstructive lung dis w/ a revers component & GOLD Stage3 COPD on PFTs> on ADVAIR115-2spBid & she is improved w/ incr exercise tolerance    Bronchiectasis & pleuroparenchymal scarring on CT, r/o MAI  infection/ Lady W[place] syndrome> QuantGOLD is neg, AFB cult is neg x1    Pseudomonas colonization> the pseudomonas was sens to Cipro...    Cardiac issues>  PAF & other arrhythmias as noted-  eval & Rx per DrVaranasi => DrCamnitz;  She has had mult EKGs and Holter checks, now on Amio & on-going eval & follow up.    Medical issues>  Hearing loss, tinnitus, GERD, DJD, depression & Insomnia; her PCP is DrGates... EXAM reveals Afeb, VSS, O2sat=96% on RA;  HEENT- neg, mallampati1;  Chest- decr BS bilat, clear w/o w/r/r;  Heart- sl irregularity, gr1/6 SEM w/o r/g;  Abd- soft, neg;  Ext- neg w/o c/c/e...  LABS 8-02/2016:  [place]ms- wnl;  CBC- wnl IMP/PLAN>>  Her breathing is improved, cardiac eval & Rx is on-going, continue current meds including the Advair115-2spBid & Klonopin 0.5mg  1/2-1 Bid prn...     Past Medical History:  Diagnosis Date  . Bronchiectasis ([place])    with multiple pneumonia back in 1990's  . Bronchitis    episodic  . DJD (degenerative joint disease) of knee   . GERD (gastroesophageal reflux disease)    episodic symptoms  . Hearing loss    chronic moderate hearing loss, worked up by ENT and hearing aids have been recommended  . History of nuclear stress test 11/07   NORMAL  . Insomnia   . PVC's (premature ventricular contractions)    on EKG, controlled on atenolol and caffeine reduction  . Tinnitus    chronic    Past Surgical History:  Procedure Laterality Date  . CARDIOVERSION N/A 08/31/2015   Procedure: CARDIOVERSION;  Surgeon: Jerline Pain, MD;  Location: [facility];  Service: Cardiovascular;  Laterality: N/A;  . cyst removed     left breast    Outpatient Encounter Prescriptions as of 03/07/2016  Medication Sig  . amiodarone (PACERONE) 200 MG tablet Take 400 mg twice a day for 2 weeks, then 400 mg daily for 2 weeks then decrease to 200 mg daily.  Marland Kitchen apixaban (ELIQUIS) 2.5 MG TABS tablet Take 1 tablet (2.5 mg total) by mouth 2 (two) times daily.  .  Cholecalciferol (VITAMIN D) 2000 UNITS CAPS Take 2,000 Units by mouth daily.  . clonazePAM (KLONOPIN) 0.5 MG tablet Take 1/2 to 1 tablet twice daily  . diltiazem (CARDIZEM) 120 MG tablet Take 120 mg by mouth daily.  . fluticasone-salmeterol (ADVAIR HFA) 115-21 MCG/ACT inhaler Inhale 2 puffs into the lungs 2 (two) times daily as needed (WHEEZING AND SHORTNESS OF BREATH).   . furosemide (LASIX) 40 MG tablet Take 40 mg by mouth daily as needed.  . sertraline (ZOLOFT) 50 MG tablet Take 50 mg by mouth daily.   No facility-administered encounter medications on file as of 03/07/2016.     Allergies  Allergen Reactions  . Augmentin [Amoxicillin-Pot Clavulanate] Nausea And Vomiting  . Biaxin [Clarithromycin] Nausea And Vomiting  . Metoprolol Other (See Comments)    Patient notes she "feels funny" after taking it  . Tequin [Gatifloxacin] Nausea Only    Current Medications, Allergies, Past Medical History, Past Surgical History, Family History, and Social History were reviewed in [employer] record.   Review of Systems            All symptoms NEG except where BOLDED >>  Constitutional:  F/C/S, fatigue, anorexia, unexpected weight change. HEENT:  HA, visual changes, hearing loss, earache, nasal symptoms, sore throat, mouth sores, hoarseness. Resp:  cough, sputum, hemoptysis; SOB, tightness, wheezing. Cardio:  CP, palpit, DOE, orthopnea, edema. GI:  N/V/D/C, blood in stool; reflux, abd pain, distention, gas. GU:  dysuria, freq, urgency, hematuria, flank pain, voiding difficulty. MS:  joint pain, swelling, tenderness, decr ROM; neck pain, back pain, etc. Neuro:  HA, tremors, seizures, dizziness, syncope, weakness, numbness, gait abn. Skin:  suspicious lesions or skin rash. Heme:  adenopathy, bruising, bleeding. Psyche:  confusion, agitation, sleep disturbance, hallucinations, anxiety, depression suicidal.   Objective:   Physical Exam      Vital Signs:  Reviewed...     General:  WD, WN, 82 y/o WM in NAD; alert & oriented; pleasant & cooperative... HEENT:  [place]/AT; Conjunctiva- pink, Sclera- nonicteric, EOM-wnl, PERRLA, EACs-clear, TMs-wnl; NOSE-clear; THROAT-clear & wnl.  Neck:  Supple w/ fairl ROM; no JVD; normal carotid impulses w/o bruits; no thyromegaly or nodules palpated; no lymphadenopathy.  Chest:  Sl decr BS at bases, clear to P & A without wheezes, rales, or signs of consolidation... Heart:  Regular Rhythm; norm S1 & S2 without murmurs, rubs, or gallops detected. Abdomen:  Soft & nontender- no guarding or rebound; normal bowel sounds; no organomegaly or masses palpated. Ext:  decrROM; without deformities +arthritic changes; no varicose veins, venous insuffic, or edema;  Pulses intact w/o bruits. Neuro:  No focal neuro deficits; sensory testing normal; gait normal & balance OK. Derm:  No lesions noted; no rash etc. Lymph:  No cervical, supraclavicular, axillary, or inguinal adenopathy palpated.   Assessment:      IMP >>     Mod obstructive lung dis w/ a revers component & GOLD Stage3 COPD on PFTs> on [id number] & she was improved w/ incr exercise tolerance    Bronchiectasis & pleuroparenchymal scarring on CT,  r/o MAI infection/ Lady [place] syndrome> QuantGOLD is neg, AFB cult is neg x1    Pseudomonas colonization> the pseudomonas was sens to Cipro...    PAF>  eval & Rx per DrVaranasi on Flecainide50Bid, CardizemCD120, Eliquis2.5Bid and Lasix40 prn; EKG today shows NSR but assoc w/ few pauses (~1.7sec, HR down to ~40s)  PLAN >> 12/28/14>   16 y/o woman w/ mild smoking hx & quit 30 yrs ago; she has mod obstructive lung dis on PFT w/ reversible component AND decr DLCO; Labs appear neg & QuantGold is neg as well- still could have underlying bronchiectasis & poss atypic mycobactium, awaiting Hi-res CT Chest, consider getting sput for cult/ AFB; In any event her parenchymal dis does not appear to be very progressive;  REC- trial SYMBICORT160- 2sp  Bid and we will recheck pt in 4-6 weeks. 02/08/15>   Naviana is stable=> improved, asked to continue the Roff; she has already had the 2016 Flu vaccine; we plan ROV in 3-4 moths, sooner if needed... 06/11/15>  Giustina remains stable-- she will decide if she prefers the MDI vs the DPI inhaletr; asked to incr exercise program, call for any problems and we plan routine f/u 69mo. 11/04/15>   She has had a lot of trouble w/ AFib; ?what is causing her dyspnea sensation (?cardiac arrhythmia, anxiety, chest wall factors); we will consider checking a holter monitor & start rx w/ Klonopin 0.5mg - 1/2to1tab Bid... 12/02/15>   Much improved on the Klonopin; still w/ slow HR, sl irreg w/ pauses, & we will proceed w/ HOLTER MONITOR...     Plan:     Patient's Medications  New Prescriptions   No medications on file  Previous Medications   AMIODARONE (PACERONE) 200 MG TABLET    Take 400 mg twice a day for 2 weeks, then 400 mg daily for 2 weeks then decrease to 200 mg daily.   APIXABAN (ELIQUIS) 2.5 MG TABS TABLET    Take 1 tablet (2.5 mg total) by mouth 2 (two) times daily.   CHOLECALCIFEROL (VITAMIN D) 2000 UNITS CAPS    Take 2,000 Units by mouth daily.   CLONAZEPAM (KLONOPIN) 0.5 MG TABLET    Take 1/2 to 1 tablet twice daily   DILTIAZEM (CARDIZEM) 120 MG TABLET    Take 120 mg by mouth daily.   FLUTICASONE-SALMETEROL (ADVAIR HFA) 115-21 MCG/ACT INHALER    Inhale 2 puffs into the lungs 2 (two) times daily as needed (WHEEZING AND SHORTNESS OF BREATH).    FUROSEMIDE (LASIX) 40 MG TABLET    Take 40 mg by mouth daily as needed.   SERTRALINE (ZOLOFT) 50 MG TABLET    Take 50 mg by mouth daily.  Modified Medications   No medications on file  Discontinued Medications   No medications on file

## 2016-03-17 DIAGNOSIS — E538 Deficiency of other specified B group vitamins: Secondary | ICD-10-CM | POA: Diagnosis not present

## 2016-03-17 DIAGNOSIS — R413 Other amnesia: Secondary | ICD-10-CM | POA: Diagnosis not present

## 2016-03-17 DIAGNOSIS — J439 Emphysema, unspecified: Secondary | ICD-10-CM | POA: Diagnosis not present

## 2016-03-17 DIAGNOSIS — J309 Allergic rhinitis, unspecified: Secondary | ICD-10-CM | POA: Diagnosis not present

## 2016-03-17 DIAGNOSIS — H918X3 Other specified hearing loss, bilateral: Secondary | ICD-10-CM | POA: Diagnosis not present

## 2016-03-17 DIAGNOSIS — Z1389 Encounter for screening for other disorder: Secondary | ICD-10-CM | POA: Diagnosis not present

## 2016-03-17 DIAGNOSIS — R001 Bradycardia, unspecified: Secondary | ICD-10-CM | POA: Diagnosis not present

## 2016-03-17 DIAGNOSIS — Z7189 Other specified counseling: Secondary | ICD-10-CM | POA: Diagnosis not present

## 2016-03-17 DIAGNOSIS — Z79899 Other long term (current) drug therapy: Secondary | ICD-10-CM | POA: Diagnosis not present

## 2016-03-17 DIAGNOSIS — Z13820 Encounter for screening for osteoporosis: Secondary | ICD-10-CM | POA: Diagnosis not present

## 2016-03-17 DIAGNOSIS — Z23 Encounter for immunization: Secondary | ICD-10-CM | POA: Diagnosis not present

## 2016-03-17 DIAGNOSIS — J479 Bronchiectasis, uncomplicated: Secondary | ICD-10-CM | POA: Diagnosis not present

## 2016-03-17 DIAGNOSIS — I479 Paroxysmal tachycardia, unspecified: Secondary | ICD-10-CM | POA: Diagnosis not present

## 2016-03-17 DIAGNOSIS — I4891 Unspecified atrial fibrillation: Secondary | ICD-10-CM | POA: Diagnosis not present

## 2016-03-17 DIAGNOSIS — E559 Vitamin D deficiency, unspecified: Secondary | ICD-10-CM | POA: Diagnosis not present

## 2016-03-17 DIAGNOSIS — Z0000 Encounter for general adult medical examination without abnormal findings: Secondary | ICD-10-CM | POA: Diagnosis not present

## 2016-03-17 DIAGNOSIS — F419 Anxiety disorder, unspecified: Secondary | ICD-10-CM | POA: Diagnosis not present

## 2016-03-17 DIAGNOSIS — R410 Disorientation, unspecified: Secondary | ICD-10-CM | POA: Diagnosis not present

## 2016-03-19 ENCOUNTER — Other Ambulatory Visit: Payer: Self-pay | Admitting: Pulmonary Disease

## 2016-03-24 ENCOUNTER — Telehealth: Payer: Self-pay | Admitting: Cardiology

## 2016-03-24 DIAGNOSIS — F419 Anxiety disorder, unspecified: Secondary | ICD-10-CM | POA: Diagnosis not present

## 2016-03-24 DIAGNOSIS — E538 Deficiency of other specified B group vitamins: Secondary | ICD-10-CM | POA: Diagnosis not present

## 2016-03-24 DIAGNOSIS — I479 Paroxysmal tachycardia, unspecified: Secondary | ICD-10-CM | POA: Diagnosis not present

## 2016-03-24 DIAGNOSIS — R413 Other amnesia: Secondary | ICD-10-CM | POA: Diagnosis not present

## 2016-03-24 DIAGNOSIS — J439 Emphysema, unspecified: Secondary | ICD-10-CM | POA: Diagnosis not present

## 2016-03-24 DIAGNOSIS — R110 Nausea: Secondary | ICD-10-CM | POA: Diagnosis not present

## 2016-03-24 DIAGNOSIS — I4891 Unspecified atrial fibrillation: Secondary | ICD-10-CM | POA: Diagnosis not present

## 2016-03-24 NOTE — Telephone Encounter (Signed)
New Message:   Pt went to primary doctor today. He felt like her Amiodarone is giving her problems.He wants to know if she can stop this medicine until her next appt with Dr Curt Bears? Her next appt is 03-29-16?

## 2016-03-24 NOTE — Telephone Encounter (Signed)
Dtr reports pt has been having nausea since starting medication.  States she is not eating/sleeping well.  PCP advised them that this is SE in older patients and to discuss with Camnitz about stopping medication.  Informed dtr that this is not an uncommon occurrence when pt is taking the "loading dose" of Amiodarone. Pt just started taking maintenance dose in the last day/two according dtr. Plan is for pt to remain on 200 mg daily and I will follow up on Monday to see how she is doing.  Dtr thanks me for calling and will update me next week.

## 2016-03-27 NOTE — Telephone Encounter (Signed)
lmtcb to see how pt is doing and if any improvement in condition

## 2016-03-28 NOTE — Progress Notes (Signed)
Electrophysiology Office Note   Date:  03/29/2016   ID:  SEQUIOA Mcdonald, DOB 1930/12/30, MRN [id number]  PCP:  Henrine Screws, MD  Cardiologist:  Christ Kick Primary Electrophysiologist:  Minta Fair Meredith Leeds, MD    Chief Complaint  Patient presents with  . Follow-up    PAF     History of Present Illness: [name] is a 82 y.o. female who presents today for electrophysiology evaluation.   History of paroxysmal atrial fibrillation and elevated CHA2DS2 VASc score of 3, on chronic anticoagulation with Eliquis. Had cardioversion 08/31/15 which was unsuccessful.  Flecainide was increased to 100 mg BID and she had attempted repeat cardioversion but was in sinus rhythm when she showed up to the hospital. Wore a holter monitor showing 18% PVCs but no atrial fibrillation with episodes of junctional rhythm and HR in the 20s during sleeping hours. On the higher dose of amiodarone, she was also having GI upset and difficulty sleeping. She says that since her amiodarone dose was decreased, she has been sleeping well without GI issues. She is trying to gain weight currently. She also says that she is sleeping much better. She is not had any episodes of atrial fibrillation, to her knowledge, and has not had any near syncope or syncope.  Today, she denies symptoms of palpitations, chest pain, orthopnea, PND, lower extremity edema, claudication, dizziness, presyncope, syncope, bleeding, or neurologic sequela. The patient is tolerating medications without difficulties and is otherwise without complaint today.    Past Medical History:  Diagnosis Date  . Bronchiectasis ([place])    with multiple pneumonia back in 1990's  . Bronchitis    episodic  . DJD (degenerative joint disease) of knee   . GERD (gastroesophageal reflux disease)    episodic symptoms  . Hearing loss    chronic moderate hearing loss, worked up by ENT and hearing aids have been recommended  . History of nuclear stress test 11/07   NORMAL  . Insomnia   . PVC's (premature ventricular contractions)    on EKG, controlled on atenolol and caffeine reduction  . Tinnitus    chronic   Past Surgical History:  Procedure Laterality Date  . CARDIOVERSION N/A 08/31/2015   Procedure: CARDIOVERSION;  Surgeon: Jerline Pain, MD;  Location: [facility];  Service: Cardiovascular;  Laterality: N/A;  . cyst removed     left breast     Current Outpatient Prescriptions  Medication Sig Dispense Refill  . ADVAIR HFA 115-21 MCG/ACT inhaler INHALE 2 PUFFS INTO THE LUNGS TWICE DAILY 12 g 0  . amiodarone (PACERONE) 200 MG tablet Take 400 mg twice a day for 2 weeks, then 400 mg daily for 2 weeks then decrease to 200 mg daily. 72 tablet 2  . apixaban (ELIQUIS) 2.5 MG TABS tablet Take 1 tablet (2.5 mg total) by mouth 2 (two) times daily. 60 tablet 11  . Cholecalciferol (VITAMIN D) 2000 UNITS CAPS Take 2,000 Units by mouth daily.    . clonazePAM (KLONOPIN) 0.5 MG tablet Take 1/2 to 1 tablet twice daily 60 tablet 5  . furosemide (LASIX) 40 MG tablet Take 40 mg by mouth daily as needed.    . sertraline (ZOLOFT) 50 MG tablet Take 50 mg by mouth daily.     No current facility-administered medications for this visit.     Allergies:   Augmentin [amoxicillin-pot clavulanate]; Biaxin [clarithromycin]; Metoprolol; and Tequin [gatifloxacin]   Social History:  The patient  reports that she quit smoking about 47 years ago. Her  smoking use included Cigarettes. She has a 10.00 pack-year smoking history. She has never used smokeless tobacco. She reports that she drinks alcohol. She reports that she does not use drugs.   Family History:  The patient's family history includes Asthma in her sister; Clotting disorder in her sister; Hypertension in her father and mother; Stroke in her father and mother.    ROS:  Please see the history of present illness.   Otherwise, review of systems is positive for Palpitations, shortness of breath, hearing loss, anxiety.    All other systems are reviewed and negative.    PHYSICAL EXAM: VS:  BP 134/78   Pulse 72   Ht 5\' 2"  (1.575 m)   Wt 108 lb (49 kg)   BMI 19.75 kg/m  , BMI Body mass index is 19.75 kg/m. GEN: Well nourished, well developed, in no acute distress  HEENT: normal  Neck: no JVD, carotid bruits, or masses Cardiac: RRR; no murmurs, rubs, or gallops, 1-2+ edema left leg Respiratory:  clear to auscultation bilaterally, normal work of breathing GI: soft, nontender, nondistended, + BS MS: no deformity or atrophy  Skin: warm and dry Neuro:  Strength and sensation are intact Psych: euthymic mood, full affect  EKG:  EKG is ordered today. Personal review of the ekg ordered shows sinus rhythm, rate 72, biatrial enlargement, LAFB  Recent Labs: 07/02/2015: TSH 2.443 07/03/2015: Magnesium 2.1 10/15/2015: Brain Natriuretic Peptide 54.2 02/23/2016: BUN 15; Creat 0.75; Hemoglobin 13.6; Platelets 172; Potassium 3.9; Sodium 143    Lipid Panel     Component Value Date/Time   CHOL 163 07/03/2015 0440   TRIG 73 07/03/2015 0440   HDL 57 07/03/2015 0440   CHOLHDL 2.9 07/03/2015 0440   VLDL 15 07/03/2015 0440   LDLCALC 91 07/03/2015 0440     Wt Readings from Last 3 Encounters:  03/29/16 108 lb (49 kg)  03/07/16 113 lb 4 oz (51.4 kg)  02/23/16 115 lb 11.9 oz (52.5 kg)      Other studies Reviewed: Additional studies/ records that were reviewed today include: TTE 07/03/15  Review of the above records today demonstrates:  - Left ventricle: The cavity size was normal. Systolic function was   normal. The estimated ejection fraction was in the range of 55%   to 60%. Wall motion was normal; there were no regional wall   motion abnormalities.  Holter 12/13/15  NSR, intermittent junctional rhythm.  PACs, PVCs.  Minimum HR 30 at 4AM, presumably during sleep.  No atrial fibrillation.  Holter 02/28/16 Minimum HR: 17 BPM at 3:44:42 AM Maximum HR: 114 BPM at 9:07:34 AM Average HR: 51 BPM 18.2%  PVCs 2.9% APCs Sinus rhytnm Zero atrial fibrillation Occasional junctional rhythm during sleeping hours 4.8 second sinus pause during sleeping hours Chest pounding associated with ventricular bigeminy Longest ventriclar run 12 beats  ASSESSMENT AND PLAN:  1.  Parosysmal atrial fibrillation: on Eliquis, amiodarone. She did have a Holter monitor that showed PVCs, and junctional rhythm during sleeping hours with up to 4. second pauses. At times her heart rate dipped into the teens and 20s. Her diltiazem was stopped and she is felt much better since then. Her amiodarone dose was also decreased to 200 mg a day which has helped with her symptoms. She is feeling well no further changes today.  This patients CHA2DS2-VASc Score and unadjusted Ischemic Stroke Rate (% per year) is equal to 3.2 % stroke rate/year from a score of 3  Above score calculated as 1 point each  if present [CHF, HTN, DM, Vascular=MI/PAD/Aortic Plaque, Age if 73-74, or Female] Above score calculated as 2 points each if present [Age > 75, or Stroke/TIA/TE]  2. LE edema: Khady Vandenberg get lower 70 Dopplers to rule out DVT. If negative Mateusz Neilan give a short course of Lasix.  Current medicines are reviewed at length with the patient today.   The patient has concerns regarding her medicines.  The following changes were made today: Diltiazem stopped amiodarone 200 mg a day.   Labs/ tests ordered today include:  Orders Placed This Encounter  Procedures  . EKG 12-Lead     Disposition:   FU with Arlin Sass 3 months  Signed, Yaman Grauberger Meredith Leeds, MD  03/29/2016 10:53 AM     [facility] [address] [place] [employer] [place] [address] [phone] (office) [phone] (fax)

## 2016-03-29 ENCOUNTER — Encounter: Payer: Self-pay | Admitting: Cardiology

## 2016-03-29 ENCOUNTER — Ambulatory Visit (INDEPENDENT_AMBULATORY_CARE_PROVIDER_SITE_OTHER): Payer: Medicare Other | Admitting: Cardiology

## 2016-03-29 VITALS — BP 134/78 | HR 72 | Ht 62.0 in | Wt 108.0 lb

## 2016-03-29 DIAGNOSIS — I481 Persistent atrial fibrillation: Secondary | ICD-10-CM

## 2016-03-29 DIAGNOSIS — I4819 Other persistent atrial fibrillation: Secondary | ICD-10-CM

## 2016-03-29 NOTE — Patient Instructions (Signed)
Medication Instructions:  Your physician recommends that you continue on your current medications as directed. Please refer to the Current Medication list given to you today.  If you need a refill on your cardiac medications before your next appointment, please call your pharmacy.   Labwork: None ordered  Testing/Procedures: None ordered  Follow-Up: Your physician wants you to follow-up in: 6 months with Dr. Camnitz.  You will receive a reminder letter in the mail two months in advance. If you don't receive a letter, please call our office to schedule the follow-up appointment.  Thank you for choosing [facility]!!   Dejaun Vidrio, RN [phone]         

## 2016-04-11 ENCOUNTER — Ambulatory Visit: Payer: Medicare Other | Admitting: Cardiology

## 2016-04-14 DIAGNOSIS — J439 Emphysema, unspecified: Secondary | ICD-10-CM | POA: Diagnosis not present

## 2016-04-14 DIAGNOSIS — R413 Other amnesia: Secondary | ICD-10-CM | POA: Diagnosis not present

## 2016-04-14 DIAGNOSIS — I4891 Unspecified atrial fibrillation: Secondary | ICD-10-CM | POA: Diagnosis not present

## 2016-04-14 DIAGNOSIS — R110 Nausea: Secondary | ICD-10-CM | POA: Diagnosis not present

## 2016-04-14 DIAGNOSIS — I479 Paroxysmal tachycardia, unspecified: Secondary | ICD-10-CM | POA: Diagnosis not present

## 2016-04-14 DIAGNOSIS — E538 Deficiency of other specified B group vitamins: Secondary | ICD-10-CM | POA: Diagnosis not present

## 2016-04-14 DIAGNOSIS — F419 Anxiety disorder, unspecified: Secondary | ICD-10-CM | POA: Diagnosis not present

## 2016-04-18 ENCOUNTER — Ambulatory Visit: Payer: Medicare Other | Admitting: Cardiology

## 2016-04-19 DIAGNOSIS — M8588 Other specified disorders of bone density and structure, other site: Secondary | ICD-10-CM | POA: Diagnosis not present

## 2016-04-19 DIAGNOSIS — M85852 Other specified disorders of bone density and structure, left thigh: Secondary | ICD-10-CM | POA: Diagnosis not present

## 2016-05-01 NOTE — Progress Notes (Signed)
Patient ID: [name], female   DOB: [birth year], 82 y.o.   MRN: [id number]     Cardiology Office Note   Date:  05/02/2016   ID:  [name], DOB 1930-08-12, MRN [id number]  PCP:  Henrine Screws, MD    No chief complaint on file. Atrial fibrillation   Wt Readings from Last 3 Encounters:  05/02/16 106 lb (48.1 kg)  03/29/16 108 lb (49 kg)  03/07/16 113 lb 4 oz (51.4 kg)       History of Present Illness: [name] is a 82 y.o. female  with a history of paroxysmal atrial fibrillation and elevated CHA2DS2 VASc score of 3, on chronic anticoagulation with Eliquis, who presents to clinic for post procedural follow-up. She recently presented to [facility] for elective direct-current cardioversion. This was performed by Dr. Marlou Porch on 08/31/2015. It was unsuccessful. Per procedure report, she briefly converted to normal sinus rhythm with frequent PACs after first shock. The next 3 shocks were followed with persistent A. fib. Subsequently, she was started on flecainide 50 mg BID and was instructed to continue diltiazem 120 mg daily. She was also advised to resume Eliquis twice a day.   Patient had an ETT in 4/17:  Blood pressure demonstrated a hypertensive response to exercise.  No T wave inversion was noted during stress.  There was no ST segment deviation noted during stress.  Arrhythmias during stress: rare PVCs and short run of 6 beats of a-fib  Blood pressure demonstrated a hypertensive response to exercise  Overall, the patient's exercise capacity was mildly impaired.  Blood pressure demonstrated a hypertensive response to exercise.  [facility] Treadmill Score: low risk   She also notes full medication compliance with Eliquis. She was switched to amiodarone and is now down to 200 mg daily.  She denies any abnormal bleeding. No melena and no falls. She still has a lot of anxiety, especially at night. This is managed by her PCP Dr. Inda Merlin and Dr. Lenna Gilford.  She was  started on clonazepam.   She did have a Holter monitor that showed PVCs, and junctional rhythm during sleeping hours with up to 4. second pauses. At times her heart rate dipped into the teens and 20s. Her diltiazem was stopped and she is felt much better since then. Her amiodarone dose was also decreased to 200 mg a day which has helped with her symptoms. She is feeling well.  This patients CHA2DS2-VASc Score and unadjusted Ischemic Stroke Rate (% per year) is equal to 3.2 % stroke rate/year from a score of 3.  Since starting the clonazepam, she sleeps better and is feeling much better.  No sustained palpitatons,  No lightheadedness or syncope.  No chest pain.      Past Medical History:  Diagnosis Date  . Bronchiectasis ([place])    with multiple pneumonia back in 1990's  . Bronchitis    episodic  . DJD (degenerative joint disease) of knee   . GERD (gastroesophageal reflux disease)    episodic symptoms  . Hearing loss    chronic moderate hearing loss, worked up by ENT and hearing aids have been recommended  . History of nuclear stress test 11/07   NORMAL  . Insomnia   . PVC's (premature ventricular contractions)    on EKG, controlled on atenolol and caffeine reduction  . Tinnitus    chronic    Past Surgical History:  Procedure Laterality Date  . CARDIOVERSION N/A 08/31/2015   Procedure: CARDIOVERSION;  Surgeon:  Jerline Pain, MD;  Location: [facility] ENDOSCOPY;  Service: Cardiovascular;  Laterality: N/A;  . cyst removed     left breast     Current Outpatient Prescriptions  Medication Sig Dispense Refill  . amiodarone (PACERONE) 200 MG tablet Take 400 mg twice a day for 2 weeks, then 400 mg daily for 2 weeks then decrease to 200 mg daily. (Patient taking differently: 200 mg daily. ) 72 tablet 2  . apixaban (ELIQUIS) 2.5 MG TABS tablet Take 1 tablet (2.5 mg total) by mouth 2 (two) times daily. 60 tablet 11  . Cholecalciferol (VITAMIN D) 2000 UNITS CAPS Take 2,000 Units by mouth daily.     . clonazePAM (KLONOPIN) 0.5 MG tablet Take 1/2 to 1 tablet twice daily (Patient taking differently: Take 0.25 mg by mouth daily. Take 1/2 to 1 tablet twice daily) 60 tablet 5  . fluticasone-salmeterol (ADVAIR HFA) 115-21 MCG/ACT inhaler Inhale 2 puffs into the lungs daily.    . furosemide (LASIX) 40 MG tablet Take 40 mg by mouth daily as needed.    . sertraline (ZOLOFT) 50 MG tablet Take 50 mg by mouth daily.     No current facility-administered medications for this visit.     Allergies:   Augmentin [amoxicillin-pot clavulanate]; Biaxin [clarithromycin]; Metoprolol; and Tequin [gatifloxacin]    Social History:  The patient  reports that she quit smoking about 47 years ago. Her smoking use included Cigarettes. She has a 10.00 pack-year smoking history. She has never used smokeless tobacco. She reports that she drinks alcohol. She reports that she does not use drugs.   Family History:  The patient's family history includes Asthma in her sister; Clotting disorder in her sister; Hypertension in her father and mother; Stroke in her father and mother.    ROS:  Please see the history of present illness.   Otherwise, review of systems are positive for dyspnea on exertion.   All other systems are reviewed and negative.    PHYSICAL EXAM: VS:  BP 138/62   Pulse (!) 47   Ht 5\' 2"  (1.575 m)   Wt 106 lb (48.1 kg)   SpO2 98%   BMI 19.39 kg/m  , BMI Body mass index is 19.39 kg/m. GEN: Well nourished, well developed, in no acute distress  HEENT: normal  Neck: no JVD, carotid bruits, or masses Cardiac: RRR; no murmurs, rubs, or gallops,no edema  Respiratory:  clear to auscultation bilaterally, normal work of breathing GI: soft, nontender, nondistended, + BS MS: no deformity or atrophy  Skin: warm and dry, no rash Neuro:  Strength and sensation are intact Psych: euthymic mood, full affect   EKG:   The ekg ordered today demonstrates normal sinus rhythm, no ST segment changes, poor R-wave  progression   Recent Labs: 07/02/2015: TSH 2.443 07/03/2015: Magnesium 2.1 10/15/2015: Brain Natriuretic Peptide 54.2 02/23/2016: BUN 15; Creat 0.75; Hemoglobin 13.6; Platelets 172; Potassium 3.9; Sodium 143   Lipid Panel    Component Value Date/Time   CHOL 163 07/03/2015 0440   TRIG 73 07/03/2015 0440   HDL 57 07/03/2015 0440   CHOLHDL 2.9 07/03/2015 0440   VLDL 15 07/03/2015 0440   LDLCALC 91 07/03/2015 0440     Other studies Reviewed: Additional studies/ records that were reviewed today with results demonstrating: Negative stress test in April 2017.   ASSESSMENT AND PLAN:   PAF: Symptoms controlled on Amiodarone.  Now off diltiazem.  Will need TSH, LFTs and CBC every 6 months due to Eliquis and Amio.  Eliquis for stroke prevention.  Watch for signs of bradycardia.  Anticoagulated.  No bleeding troubles.  Mild lower extremity edema: Continue furosemide prn.  Dyspnea on exertion: Stable.  Lasix prn.  May need to have her take Lasix more regularly if she does have fluid overload.  Palpitations sound like premature beats.  Of note, the daughter spoke to me privately about her concern that our office did answer to a call from Dr. Jeannine Kitten office.  I recalled phone messages left by both myself and Dr. Lenna Gilford several months ago which led to the Holter monitor and EP referral.  It may be best to send communications through [facility] so that we can answer the daughter's concerns more specifically in the future.    Current medicines are reviewed at length with the patient today.  The patient concerns regarding her medicines were addressed.  The following changes have been made:  No change  Labs/ tests ordered today include:  No orders of the defined types were placed in this encounter.   Recommend 150 minutes/week of aerobic exercise Low fat, low carb, high fiber diet recommended  Disposition:   FU in 6 months   Signed, Larae Grooms, MD  05/02/2016 [facility] [place], [place], [place]  [address] Phone: [phone]; Fax: [phone]

## 2016-05-02 ENCOUNTER — Ambulatory Visit (INDEPENDENT_AMBULATORY_CARE_PROVIDER_SITE_OTHER): Payer: Medicare Other | Admitting: Interventional Cardiology

## 2016-05-02 ENCOUNTER — Encounter (INDEPENDENT_AMBULATORY_CARE_PROVIDER_SITE_OTHER): Payer: Self-pay

## 2016-05-02 ENCOUNTER — Encounter: Payer: Self-pay | Admitting: Interventional Cardiology

## 2016-05-02 VITALS — BP 138/62 | HR 47 | Ht 62.0 in | Wt 106.0 lb

## 2016-05-02 DIAGNOSIS — I4891 Unspecified atrial fibrillation: Secondary | ICD-10-CM

## 2016-05-02 DIAGNOSIS — I491 Atrial premature depolarization: Secondary | ICD-10-CM | POA: Diagnosis not present

## 2016-05-02 DIAGNOSIS — Z5181 Encounter for therapeutic drug level monitoring: Secondary | ICD-10-CM

## 2016-05-02 DIAGNOSIS — I480 Paroxysmal atrial fibrillation: Secondary | ICD-10-CM

## 2016-05-02 DIAGNOSIS — Z7901 Long term (current) use of anticoagulants: Secondary | ICD-10-CM

## 2016-05-02 DIAGNOSIS — Z79899 Other long term (current) drug therapy: Secondary | ICD-10-CM

## 2016-05-02 NOTE — Patient Instructions (Signed)
Medication Instructions:  Same-no changes  Labwork: In Feb. 2018-TSH, LFTs and CBCD  Testing/Procedures: None  Follow-Up: Your physician wants you to follow-up in: 6 month. You will receive a reminder letter in the mail two months in advance. If you don't receive a letter, please call our office to schedule the follow-up appointment.     If you need a refill on your cardiac medications before your next appointment, please call your pharmacy.

## 2016-05-13 ENCOUNTER — Other Ambulatory Visit: Payer: Self-pay | Admitting: Pulmonary Disease

## 2016-05-16 ENCOUNTER — Telehealth: Payer: Self-pay | Admitting: Pulmonary Disease

## 2016-05-16 NOTE — Telephone Encounter (Signed)
Pt requesting a refill on clonazepam 0.5mg  Last refill: 11/04/2015 #60 with 5 refills, take 0.5-1 tablet twice daily.   Pt requests this be sent to [facility] on [address].  SN please advise on refill.  Thanks.

## 2016-05-17 MED ORDER — CLONAZEPAM 0.5 MG PO TABS: ORAL_TABLET | ORAL | 5 refills | Status: DC

## 2016-05-17 NOTE — Telephone Encounter (Signed)
Called and spoke with pt and she is aware of refill that has been called to the pharmacy.   

## 2016-05-26 ENCOUNTER — Ambulatory Visit: Payer: Medicare Other | Admitting: Cardiology

## 2016-05-30 ENCOUNTER — Other Ambulatory Visit: Payer: Self-pay | Admitting: Pulmonary Disease

## 2016-06-07 ENCOUNTER — Encounter: Payer: Self-pay | Admitting: Pulmonary Disease

## 2016-06-07 ENCOUNTER — Ambulatory Visit (INDEPENDENT_AMBULATORY_CARE_PROVIDER_SITE_OTHER): Payer: PPO | Admitting: Pulmonary Disease

## 2016-06-07 VITALS — BP 116/68 | HR 52 | Temp 97.2°F | Ht 62.0 in | Wt 107.4 lb

## 2016-06-07 DIAGNOSIS — R002 Palpitations: Secondary | ICD-10-CM

## 2016-06-07 DIAGNOSIS — I480 Paroxysmal atrial fibrillation: Secondary | ICD-10-CM

## 2016-06-07 DIAGNOSIS — R0600 Dyspnea, unspecified: Secondary | ICD-10-CM | POA: Diagnosis not present

## 2016-06-07 DIAGNOSIS — J479 Bronchiectasis, uncomplicated: Secondary | ICD-10-CM | POA: Diagnosis not present

## 2016-06-07 DIAGNOSIS — J449 Chronic obstructive pulmonary disease, unspecified: Secondary | ICD-10-CM

## 2016-06-07 NOTE — Progress Notes (Signed)
Subjective:     Patient ID: [name], female   DOB: 1930-07-22, 83 y.o.   MRN: [id number]  HPI    83 y/o WF referred for dyspnea> she has bronchiectasis/ scarring, mod COPD w/ a revers component, NEG Quant Gold assay, and sput w/ Pseudomonas x1 & NEG AFB/ Fungi;  She is followed by [facility] for Cards w/ PAFib...   ~  December 28, 2014:  Initial pulmonary consult by SN>   49 y/o WF, referred by Dr. Mertha Finders, for evaluation of dyspnea>>       [name] tells me that she was diagnosed w/ bronchiectasis ~15 yrs ago- notes that she had whooping cough as a child (but no known sequelae in childhood) and pneumonia x3 as an adult (in the 1990s she says);  She is not sure of her early course w/ her lung dis & records in Epic show old films from [place] office back to 2003 w/ similar features to current CXRs=> biapical pleuroparenchymal scarring with a +/- stable pattern;  CT Angiogram done 02/2006 showed neg for PE, biapical pleuroparenchymal scarring w/ fibronodular changes in RUL, RML, Lingula felt to represent likely post infectious scarring and NAD;  CXRs done 11/10/14 and 02/20/14 showed sl hyperinflation, flattening of diaphragms, stable biapical & upper lobe scarring, normal heart size, NAD...       Symptomatically she notes SOB/DOE- breathing harder when walking fast or exerting, going on for about 42yr & not really progressive (fairly stable dyspnea), ADLs are OK, prev walked on treadmill for 16min about 76yr ago but now not going to the gym;  She notes some chest tightness, a choking sensation in her throat, hard to get the air "IN" and a yawn seems to help temporarily;  She notes mild cough, sm amt green=>beige sput, w/o hemoptysis/ f/c/s... She is an ex-smoker having started in her teens, smoked off & on, up to 1/2 ppd, and quit in the 1980s- calc about a 15 pack-year smoking hx...       She is followed for CARDS by DrVaranasi> Hx CP & palpit w/ neg stress test and neg cath 2010; ?lifeline screen  showed Afib but never confirmed (also showed mild carotid dis); notes SOB, choking, palpit, & Holter in 2014 showed PACs & given Lorazepam by PCP w/ some relief; DrV checks her yearly- on ASA81 and Metop25Bid...       Current meds> Metop25Bid, Lorazepam0.5 prn, VitD supplement... EXAM reveals Afeb, VSS, O2sat=97% on RA;  HEENT- neg, mallampati1;  Chest- decr BS bilat, clear w/o w/r/r;  Heart- RR, gr1/6 SEM w/o r/g;  Abd- soft, neg;  Ext- neg w/o c/c/e...  Old CXRs in PACS> 2003/2007 showed biapical pleuroparenchymal scarring with a +/- stable pattern.   CT Angiogram done 02/2006 showed neg for PE, biapical pleuroparenchymal scarring w/ fibronodular changes in RUL, RML, lingula felt to represent likely post infectious scarring and NAD.   CXRs done 11/10/14 and 02/20/14 showed sl hyperinflation, flattening of diaphragms, stable biapical & upper lobe scarring, normal heart size, NAD (osteopenia & mid Tspine compression)...   PFT 11/20/14 showed FVC=1.80 (78%), FEV1=0.90 (53%), %1sec=50, mid-flows reduced at 31% predicted; post bronchodil there was a 19% improvement in FEV1; LungVols- TLC=4.66 (98%), RV=2.73 (117%), RV/TLC=59%; DLCO is reduced at 43% predicted...   LABS 12/28/14:  We checked BMet- wnl;  CBC- wnl w/ 2% eos;  Sed=16;  Quantiferon GOLD= NEG...  Hi-res CT Chest => sched for 01/05/15 & pending     IMP/PLAN>>  27 y/o woman  w/ mild smoking hx & quit 30 yrs ago; she has mod obstructive lung dis on PFT w/ reversible component AND decr DLCO; Labs appear neg & QuantGold is neg as well- still could have underlying bronchiectasis & poss atypic mycobactium, awaiting Hi-res CT Chest, consider getting sput for cult/ AFB; In any event her parenchymal dis does not appear to be very progressive;  REC- trial SYMBICORT160- 2sp Bid and we will recheck pt in 4-6 weeks...   ADDENDUM>>  CT Chest 01/05/15 showed norm heart size, atherosclerosis, sl dilated PA trunk at 3.5cm suggestion PAH, no adenopathy;  Lungs show  bilat patchy bronchiectasis & interstitial thickening w/ peribronchvasc nodularity (sl worse than 2007) and no ground glass, reticulation, honeycombing => we will check sput specimen for cult & AFB.Marland Kitchen. (2DEcho 06/2015 w/ PAsys=19) ADDENDUM>>  Sputum C&S 01/15/15 was pos for Pseudomonas (resist to Rocephin, sens to Cipro etc)- likely colonizer;  AFB smears are neg & culture pending...   ~  February 08, 2015:  6wk ROV w/ SN>  [name] reports that her breathing is improved, less SOB, notes that she walked a lot over the weekend at the [place] did well;  She denies much cough, sput, no hemoptysis, no CP, no f/c/s... She remains on Advair115-2spBid and Lorazepam 0.5mg  prn...     Mod obstructive lung dis w/ a revers component & GOLD Stage3 COPD on PFTs> on ADVAIR115-2spBid & she is improved w/ incr exercise tolerance    Bronchiectasis & pleuroparenchymal scarring on CT, r/o MAI infection/ Lady [place] syndrome> AFB cult is neg x1    Pseudomonas colonization> the pseudomonas is sens to Cipro... EXAM reveals Afeb, VSS, O2sat=97% on RA;  HEENT- neg, mallampati1;  Chest- decr BS bilat, clear w/o w/r/r;  Heart- RR, gr1/6 SEM w/o r/g;  Abd- soft, neg;  Ext- neg w/o c/c/e... IMP/PLAN>>  [name] is stable=> improved, asked to continue the Advair115-2spBid; she has already had the 2016 Flu vaccine; we plan ROV in 3-4 moths, sooner if needed...   ~  June 10, 2015:  25mo ROV w/ SN>  [name] remains stable- breathing is good, no prob w/ winter weather so far, she remains active & denies cough, sput, hemoptysis; she has chr stable DOE & trying to exercise; she wonders if the Advair250-DPI inhaler would be better for her & we gave her a sample 7 new Rx so she can decide...      Mod obstructive lung dis w/ a revers component & GOLD Stage3 COPD on PFTs> on ADVAIR115-2spBid & she is improved w/ incr exercise tolerance    Bronchiectasis & pleuroparenchymal scarring on CT, r/o MAI infection/ [place] syndrome> AFB  cult is neg x1    Pseudomonas colonization> the pseudomonas is sens to Cipro... EXAM reveals Afeb, VSS, O2sat=96% on RA;  HEENT- neg, mallampati1;  Chest- decr BS bilat, clear w/o w/r/r;  Heart- RR, gr1/6 SEM w/o r/g;  Abd- soft, neg;  Ext- neg w/o c/c/e... IMP/PLAN>>  [name] remains stable-- she will decide if she prefers the MDI vs the DPI inhaletr; asked to incr exercise program, call for any problems and we plan routine f/u 23mo...   ~  November 04, 2015:  76mo ROV w/ SN>  [name] has had a lot of difficulty in the interval- AFib w/ rvr Feb2017 and Adm 1d for IV Cardizem drip, converted to NSR & disch on Metop50Bid & Eliquis2.5Bid;  Then she developed recurrent AFib on 08/31/15 & failed DCCV=> placed on Flecainide & converted to NSR;  She has followed up w/ DrVaranasi 10/15/15 on Flecainide50Bid, CardizemCD120, Eliquis2.5Bid and Lasix40 prn; Labs were OK, BNP=54 and told to continue same...     She presents today w/ a ?2wk hx of SOB, ?choking sensation, heavy feeling in upper chest/throat area- hard for her to describe; it is intermittent- ?when noted, ?ppt, nothing relieves it;  She stopped her Advair several weeks ago on her own therefore restarted recently;  She denies CP or palp sensation, denies dizzy/ lightheaded/ syncope;  She denies cough/ sput/ hemoptysis/ etc;  When asked to describe her SOB further- she cannot, asked if it's hard to get the air "in" or "out" she says both;  Daughter adds that she's been under a lot of stress and quite anxious- ?had a reaction to Lorazepam months ago & DrGates switched her to Zoloft;  Exam today showed sl irreg HR and EKGs showed NSR but sinus pauses & then ?escape beats=> she did not sense these episodes on the EKG machine (not c/o dizzy/ lightheaded/ palpit/ etc)...    Mod obstructive lung dis w/ a revers component & GOLD Stage3 COPD on PFTs> on ADVAIR115-2spBid & she is improved w/ incr exercise tolerance    Bronchiectasis & pleuroparenchymal scarring on CT, r/o MAI  infection/ Lady [place] syndrome> QuantGOLD is neg, AFB cult is neg x1    Pseudomonas colonization> the pseudomonas was sens to Cipro...    PAF>  eval & Rx per DrVaranasi on Flecainide50Bid, CardizemCD120, Eliquis2.5Bid and Lasix40 prn; EKG today shows NSR but assoc w/ few pauses (~1.5sec, HR down to ~40) EXAM reveals Afeb, VSS, O2sat=96% on RA;  HEENT- neg, mallampati1;  Chest- decr BS bilat, clear w/o w/r/r;  Heart- sl irregularity, gr1/6 SEM w/o r/g;  Abd- soft, neg;  Ext- neg w/o c/c/e...  EKG 07/02/15 w/ AFib & rvr, rate=130, poor R progression V1-3...  2DEcho 07/02/15>  Norm LV size & function w/ EF=55-60%, no regional wall motion abn, norm AoV, essentially norm MV w/ trivMR, norm LA/RA, norm RV w/ RVsys=19...  EKG after chemical cardioversion> NSR, rate=80, RAE, poor R prograssion V1-3  CXR 08/24/15 showed norm heart size, hyperexpansion w/ scarring both upper lobes & nodular opac in periph LUL, degen changes in Tspine...  EKG today 11/04/15>  NSR w/ sinus pauses up to 1.5sec documented (HR~40 in that interval)RAE, poor R progression V1-3... IMP/PLAN>>  I am not sure what is going on here; she is certainly quite anxious but puts up a good front for daughter; she does NOT describe the dyspnea as the classic "can't get a DB, can't get the air IN", but has a discomfort ?choking sensation in throat/ upper chest (?globus)- btw she denies any heartburn symptoms;  I'm concerned the symptoms could be a manifestation of an arrhythmia & I'm inclined to suggest a HOLTER monitor for further eval but I'd like to discuss w/ DrVaranasi first; she is NOT on a Benzo- just Zoloft now- & I am inclined to start KLONOPIN 0.5mg  tabs- 1/2 to 1 tab PO Bid...we plan ROV recheck in 29mo...  ~  December 02, 2015:  54mo ROV & [name] reports a prompt and complete response to the [facility] 0.5mg - 1/2Bid; this has eliminated her choking sensation, chest discomfort, etc and she is now resting well at night, awakes refreshed & feeling  better; she continues to deny palpit, dizziness, lightheadedness, near syncope, etc...  EXAM shows Afeb, VSS, O2sat=96% on RA;  HEENT- neg, mallampati1;  Chest- decr BS bilat, clear w/o w/r/r;  Heart- sl irregularity, gr1/6  SEM w/o r/g;  Abd- soft, neg;  Ext- neg w/o c/c/e...  EKG today 12/02/15> ?SBrady, rate 43, some variability in p wave, poor R prog V1-2... Longest pause=1.7sec on this EKG. IMP/PLAN>>  Anjeli is feeling much better on the Klonopin & we will continue same;  I am still concerned about the arryhthmia, long r-r interval, slow HR, p-wave morphology differs => decided to proceed w/ HOLTER MONITOR to be sure she is not having any more serious pauses/ arrhythmias...   ADDENDUM>>  48H Holter 12/13/15 showed NSR, intermittent junctional rhythm., PACs, PVCs. Minimum HR 30 at 4AM, No atrial fibrillation.=> referred to EP... Pt seen by [facility] 01/13/16>  Hx PAF on Eliquis, unsuccessful cardioversion 08/2015, treated w/ Flecainide & Diltiazem, Flec dose increased & she converted to NSR=> junctional rhythm & repeat Holter 02/28/16 showed NSR, PVCs, PACs, occas juntional rhythm & 4.8 sec pause while sleeping; palpit were due to bigeminy... They decided to STOP the Flecainide & diltiazem & start AMIODARONE...   ~  March 07, 2016:  8mo ROV w/ SN>  [name] continues to do well on the Klonopin- breathing well now & just using 1/2 of the Klonopin prn (taking 3/7 days on ave)... She has been eval & followed by [facility] for EP- see above, now on AMIO;  She has a f/u appt w/ PCP- DrGates soon for a CPX & will get her FLU vaccine there... We reviewed the following medical problems during today's office visit >>     Dyspnea- multifactorial>  This improved on Klonopin trial taking 0.5mg - 1/2 to 1 tab bid...    Mod obstructive lung dis w/ a revers component & GOLD Stage3 COPD on PFTs> on ADVAIR115-2spBid & she is improved w/ incr exercise tolerance    Bronchiectasis & pleuroparenchymal scarring on CT, r/o MAI  infection/ [place] syndrome> QuantGOLD is neg, AFB cult is neg x1    Pseudomonas colonization> the pseudomonas was sens to Cipro...    Cardiac issues>  PAF & other arrhythmias as noted-  eval & Rx per DrVaranasi => DrCamnitz;  She has had mult EKGs and Holter checks, now on Amio & on-going eval & follow up.    Medical issues>  Hearing loss, tinnitus, GERD, DJD, depression & Insomnia; her PCP is DrGates... EXAM reveals Afeb, VSS, O2sat=96% on RA;  HEENT- neg, mallampati1;  Chest- decr BS bilat, clear w/o w/r/r;  Heart- sl irregularity, gr1/6 SEM w/o r/g;  Abd- soft, neg;  Ext- neg w/o c/c/e...  LABS 8-02/2016:  [place]ms- wnl;  CBC- wnl IMP/PLAN>>  Her breathing is improved, cardiac eval & Rx is on-going, continue current meds including the Advair115-2spBid & Klonopin 0.5mg  1/2-1 Bid prn...    ~  June 07, 2016:  58mo ROV w/ SN>  [name] continues to do well- denies SOB, her breathing is good, no new complaints or concerns; she notes min cough & sput, no hemoptyis or discolored phlegm, no CP, she is doing her housework/ walking/ stairs/ etc... She remains on Advair115- 2spBid & Klonopin 0.5mg  1/2-1 Bid... 05/02/16 -- she saw DrVaranasi for CARDS f/u PAF on [place]; she had DCCV 08/2015 which was unsuccessful; she had Myoview & Holter- his note is reviewed...  We reviewed the following medical problems during today's office visit >>     Dyspnea- multifactorial>  This improved on Klonopin taking 0.5mg - 1/2 to 1 tab bid, she has increased her activities & doing satis...    Mod obstructive lung dis w/ a revers component & GOLD [name]  COPD on PFTs> on ADVAIR115-2spBid & she is improved w/ better exercise tolerance    Bronchiectasis & pleuroparenchymal scarring on CT, r/o MAI infection/ Lady W[place] syndrome> QuantGOLD is neg, AFB cult is neg x1    Pseudomonas colonization> the pseudomonas was sens to Cipro...    Cardiac issues>  PAF & other arrhythmias as noted-  eval & Rx per  DrVaranasi => DrCamnitz;  She has had mult EKGs and Holter checks, now on Amio200/ Eliquis2.5Bid/ Lasix40    Medical issues>  Hearing loss, tinnitus, GERD, DJD, VitD de48fic (on 2000u daily)depression (on Zoloft50)& Insomnia; her PCP is DrGates... EXAM reveals Afeb, VSS, O2sat=96% on RA;  HEENT- neg, mallampati1;  Chest- decr BS bilat, clear w/o w/r/r;  Heart- sl irregularity, gr1/6 SEM w/o r/g;  Abd- soft, neg;  Ext- neg w/o c/c/e... IMP/PLAN>> [name] remains stable on the current regimen- continue same & call for any problems...    Past Medical History:  Diagnosis Date  . Bronchiectasis ([place])    with multiple pneumonia back in 1990's  . Bronchitis    episodic  . DJD (degenerative joint disease) of knee   . GERD (gastroesophageal reflux disease)    episodic symptoms  . Hearing loss    chronic moderate hearing loss, worked up by ENT and hearing aids have been recommended  . History of nuclear stress test 11/07   NORMAL  . Insomnia   . PVC's (premature ventricular contractions)    on EKG, controlled on atenolol and caffeine reduction  . Tinnitus    chronic    Past Surgical History:  Procedure Laterality Date  . CARDIOVERSION N/A 08/31/2015   Procedure: CARDIOVERSION;  Surgeon: Jerline Pain, MD;  Location: [facility] ENDOSCOPY;  Service: Cardiovascular;  Laterality: N/A;  . cyst removed     left breast    Outpatient Encounter Prescriptions as of 06/07/2016  Medication Sig  . ADVAIR HFA 115-21 MCG/ACT inhaler INHALE 2 PUFFS INTO THE LUNGS TWICE DAILY  . amiodarone (PACERONE) 200 MG tablet Take 400 mg twice a day for 2 weeks, then 400 mg daily for 2 weeks then decrease to 200 mg daily. (Patient taking differently: 200 mg daily. )  . apixaban (ELIQUIS) 2.5 MG TABS tablet Take 1 tablet (2.5 mg total) by mouth 2 (two) times daily.  . Cholecalciferol (VITAMIN D) 2000 UNITS CAPS Take 2,000 Units by mouth daily.  . clonazePAM (KLONOPIN) 0.5 MG tablet Take 1/2 to 1 tablet twice daily  . furosemide  (LASIX) 40 MG tablet Take 40 mg by mouth daily as needed.  . sertraline (ZOLOFT) 50 MG tablet Take 50 mg by mouth daily.  . [DISCONTINUED] fluticasone-salmeterol (ADVAIR HFA) 115-21 MCG/ACT inhaler Inhale 2 puffs into the lungs daily.   No facility-administered encounter medications on file as of 06/07/2016.     Allergies  Allergen Reactions  . Augmentin [Amoxicillin-Pot Clavulanate] Nausea And Vomiting  . Biaxin [Clarithromycin] Nausea And Vomiting  . Metoprolol Other (See Comments)    Patient notes she "feels funny" after taking it  . Tequin [Gatifloxacin] Nausea Only    Immunization History  Administered Date(s) Administered  . Influenza, High Dose Seasonal PF 02/27/2016  . Influenza-Unspecified 03/01/2015  . Pneumococcal Conjugate-13 10/15/2013  . Pneumococcal-Unspecified 02/27/2016    Current Medications, Allergies, Past Medical History, Past Surgical History, Family History, and Social History were reviewed in [employer] record.   Review of Systems            All symptoms NEG except where BOLDED >>  Constitutional:  F/C/S, fatigue, anorexia, unexpected weight change. HEENT:  HA, visual changes, hearing loss, earache, nasal symptoms, sore throat, mouth sores, hoarseness. Resp:  cough, sputum, hemoptysis; SOB, tightness, wheezing. Cardio:  CP, palpit, DOE, orthopnea, edema. GI:  N/V/D/C, blood in stool; reflux, abd pain, distention, gas. GU:  dysuria, freq, urgency, hematuria, flank pain, voiding difficulty. MS:  joint pain, swelling, tenderness, decr ROM; neck pain, back pain, etc. Neuro:  HA, tremors, seizures, dizziness, syncope, weakness, numbness, gait abn. Skin:  suspicious lesions or skin rash. Heme:  adenopathy, bruising, bleeding. Psyche:  confusion, agitation, sleep disturbance, hallucinations, anxiety, depression suicidal.   Objective:   Physical Exam      Vital Signs:  Reviewed...   General:  WD, WN, 83 y/o WM in NAD; alert &  oriented; pleasant & cooperative... HEENT:  [place]/AT; Conjunctiva- pink, Sclera- nonicteric, EOM-wnl, PERRLA, EACs-clear, TMs-wnl; NOSE-clear; THROAT-clear & wnl.  Neck:  Supple w/ fairl ROM; no JVD; normal carotid impulses w/o bruits; no thyromegaly or nodules palpated; no lymphadenopathy.  Chest:  Sl decr BS at bases, clear to P & A without wheezes, rales, or signs of consolidation... Heart:  Regular Rhythm; norm S1 & S2 without murmurs, rubs, or gallops detected. Abdomen:  Soft & nontender- no guarding or rebound; normal bowel sounds; no organomegaly or masses palpated. Ext:  decrROM; without deformities +arthritic changes; no varicose veins, venous insuffic, or edema;  Pulses intact w/o bruits. Neuro:  No focal neuro deficits; sensory testing normal; gait normal & balance OK. Derm:  No lesions noted; no rash etc. Lymph:  No cervical, supraclavicular, axillary, or inguinal adenopathy palpated.   Assessment:      IMP >>     Mod obstructive lung dis w/ a revers component & GOLD Stage3 COPD on PFTs> on ADVAIR115-2spBid & she was improved w/ incr exercise tolerance    Bronchiectasis & pleuroparenchymal scarring on CT, r/o MAI infection/ [place] syndrome> QuantGOLD is neg, AFB cult is neg x1    Pseudomonas colonization> the pseudomonas was sens to Cipro...    PAF>  eval & Rx per DrVaranasi on Flecainide50Bid, CardizemCD120, Eliquis2.5Bid and Lasix40 prn; EKG today shows NSR but assoc w/ few pauses (~1.7sec, HR down to ~40s)  PLAN >> 12/28/14>   13 y/o woman w/ mild smoking hx & quit 30 yrs ago; she has mod obstructive lung dis on PFT w/ reversible component AND decr DLCO; Labs appear neg & QuantGold is neg as well- still could have underlying bronchiectasis & poss atypic mycobactium, awaiting Hi-res CT Chest, consider getting sput for cult/ AFB; In any event her parenchymal dis does not appear to be very progressive;  REC- trial SYMBICORT160- 2sp Bid and we will recheck pt in 4-6  weeks. 02/08/15>   [name] is stable=> improved, asked to continue the [place]; she has already had the 2016 Flu vaccine; we plan ROV in 3-4 moths, sooner if needed... 06/11/15>  [name] remains stable-- she will decide if she prefers the MDI vs the DPI inhaletr; asked to incr exercise program, call for any problems and we plan routine f/u 39mo. 11/04/15>   She has had a lot of trouble w/ AFib; ?what is causing her dyspnea sensation (?cardiac arrhythmia, anxiety, chest wall factors); we will consider checking a holter monitor & start rx w/ Klonopin 0.5mg - 1/2to1tab Bid... 12/02/15>   Much improved on the Klonopin; still w/ slow HR, sl irreg w/ pauses, & we will proceed w/ HOLTER MONITOR... 06/07/16>   [name] remains stable on the current  regimen- continue same & call for any problems...     Plan:     Patient's Medications  New Prescriptions   No medications on file  Previous Medications   ADVAIR HFA 115-21 MCG/ACT INHALER    INHALE 2 PUFFS INTO THE LUNGS TWICE DAILY   AMIODARONE (PACERONE) 200 MG TABLET    Take 400 mg twice a day for 2 weeks, then 400 mg daily for 2 weeks then decrease to 200 mg daily.   APIXABAN (ELIQUIS) 2.5 MG TABS TABLET    Take 1 tablet (2.5 mg total) by mouth 2 (two) times daily.   CHOLECALCIFEROL (VITAMIN D) 2000 UNITS CAPS    Take 2,000 Units by mouth daily.   CLONAZEPAM (KLONOPIN) 0.5 MG TABLET    Take 1/2 to 1 tablet twice daily   FUROSEMIDE (LASIX) 40 MG TABLET    Take 40 mg by mouth daily as needed.   SERTRALINE (ZOLOFT) 50 MG TABLET    Take 50 mg by mouth daily.  Modified Medications   No medications on file  Discontinued Medications   FLUTICASONE-SALMETEROL (ADVAIR HFA) 115-21 MCG/ACT INHALER    Inhale 2 puffs into the lungs daily.

## 2016-06-07 NOTE — Patient Instructions (Addendum)
Today we updated your med list in our EPIC system...    Continue your current medications the same...  Keep up the good work w/ your exercise program...  Call for any questions...  Let's plan a follow up visit in 41mo, sooner if needed for problems.Marland KitchenMarland Kitchen

## 2016-06-22 ENCOUNTER — Other Ambulatory Visit: Payer: Self-pay | Admitting: *Deleted

## 2016-06-22 MED ORDER — APIXABAN 2.5 MG PO TABS
2.5000 mg | ORAL_TABLET | Freq: Two times a day (BID) | ORAL | 5 refills | Status: DC
Start: 2016-06-22 — End: 2016-12-18

## 2016-07-06 ENCOUNTER — Other Ambulatory Visit: Payer: PPO | Admitting: *Deleted

## 2016-07-06 DIAGNOSIS — Z5181 Encounter for therapeutic drug level monitoring: Secondary | ICD-10-CM | POA: Diagnosis not present

## 2016-07-06 DIAGNOSIS — I4891 Unspecified atrial fibrillation: Secondary | ICD-10-CM

## 2016-07-06 DIAGNOSIS — Z79899 Other long term (current) drug therapy: Secondary | ICD-10-CM | POA: Diagnosis not present

## 2016-07-06 NOTE — Progress Notes (Signed)
TSH 

## 2016-07-07 ENCOUNTER — Other Ambulatory Visit: Payer: Medicare Other

## 2016-07-07 LAB — CBC WITH DIFFERENTIAL/PLATELET
BASOS: 0 %
Basophils Absolute: 0 10*3/uL (ref 0.0–0.2)
EOS (ABSOLUTE): 0.1 10*3/uL (ref 0.0–0.4)
EOS: 2 %
Hematocrit: 37.2 % (ref 34.0–46.6)
Hemoglobin: 12.2 g/dL (ref 11.1–15.9)
IMMATURE GRANS (ABS): 0 10*3/uL (ref 0.0–0.1)
IMMATURE GRANULOCYTES: 0 %
LYMPHS: 32 %
Lymphocytes Absolute: 1.6 10*3/uL (ref 0.7–3.1)
MCH: 29.7 pg (ref 26.6–33.0)
MCHC: 32.8 g/dL (ref 31.5–35.7)
MCV: 91 fL (ref 79–97)
Monocytes Absolute: 0.3 10*3/uL (ref 0.1–0.9)
Monocytes: 7 %
NEUTROS PCT: 59 %
Neutrophils Absolute: 2.9 10*3/uL (ref 1.4–7.0)
PLATELETS: 142 10*3/uL — AB (ref 150–379)
RBC: 4.11 x10E6/uL (ref 3.77–5.28)
RDW: 15.5 % — ABNORMAL HIGH (ref 12.3–15.4)
WBC: 4.9 10*3/uL (ref 3.4–10.8)

## 2016-07-07 LAB — HEPATIC FUNCTION PANEL
ALBUMIN: 4 g/dL (ref 3.5–4.7)
ALT: 24 IU/L (ref 0–32)
AST: 26 IU/L (ref 0–40)
Alkaline Phosphatase: 77 IU/L (ref 39–117)
Bilirubin Total: 0.5 mg/dL (ref 0.0–1.2)
Bilirubin, Direct: 0.15 mg/dL (ref 0.00–0.40)
TOTAL PROTEIN: 6.3 g/dL (ref 6.0–8.5)

## 2016-07-07 LAB — TSH: TSH: 1.17 u[IU]/mL (ref 0.450–4.500)

## 2016-08-03 IMAGING — CR DG CHEST 2V
2 series · 2 of 2 positions shown · non-contrast
Comparison: CT of the chest 01/05/2015.

CLINICAL DATA: A irregular heartbeat.  Palpitations.

EXAM:
CHEST - 2 VIEW

[w chest pa]
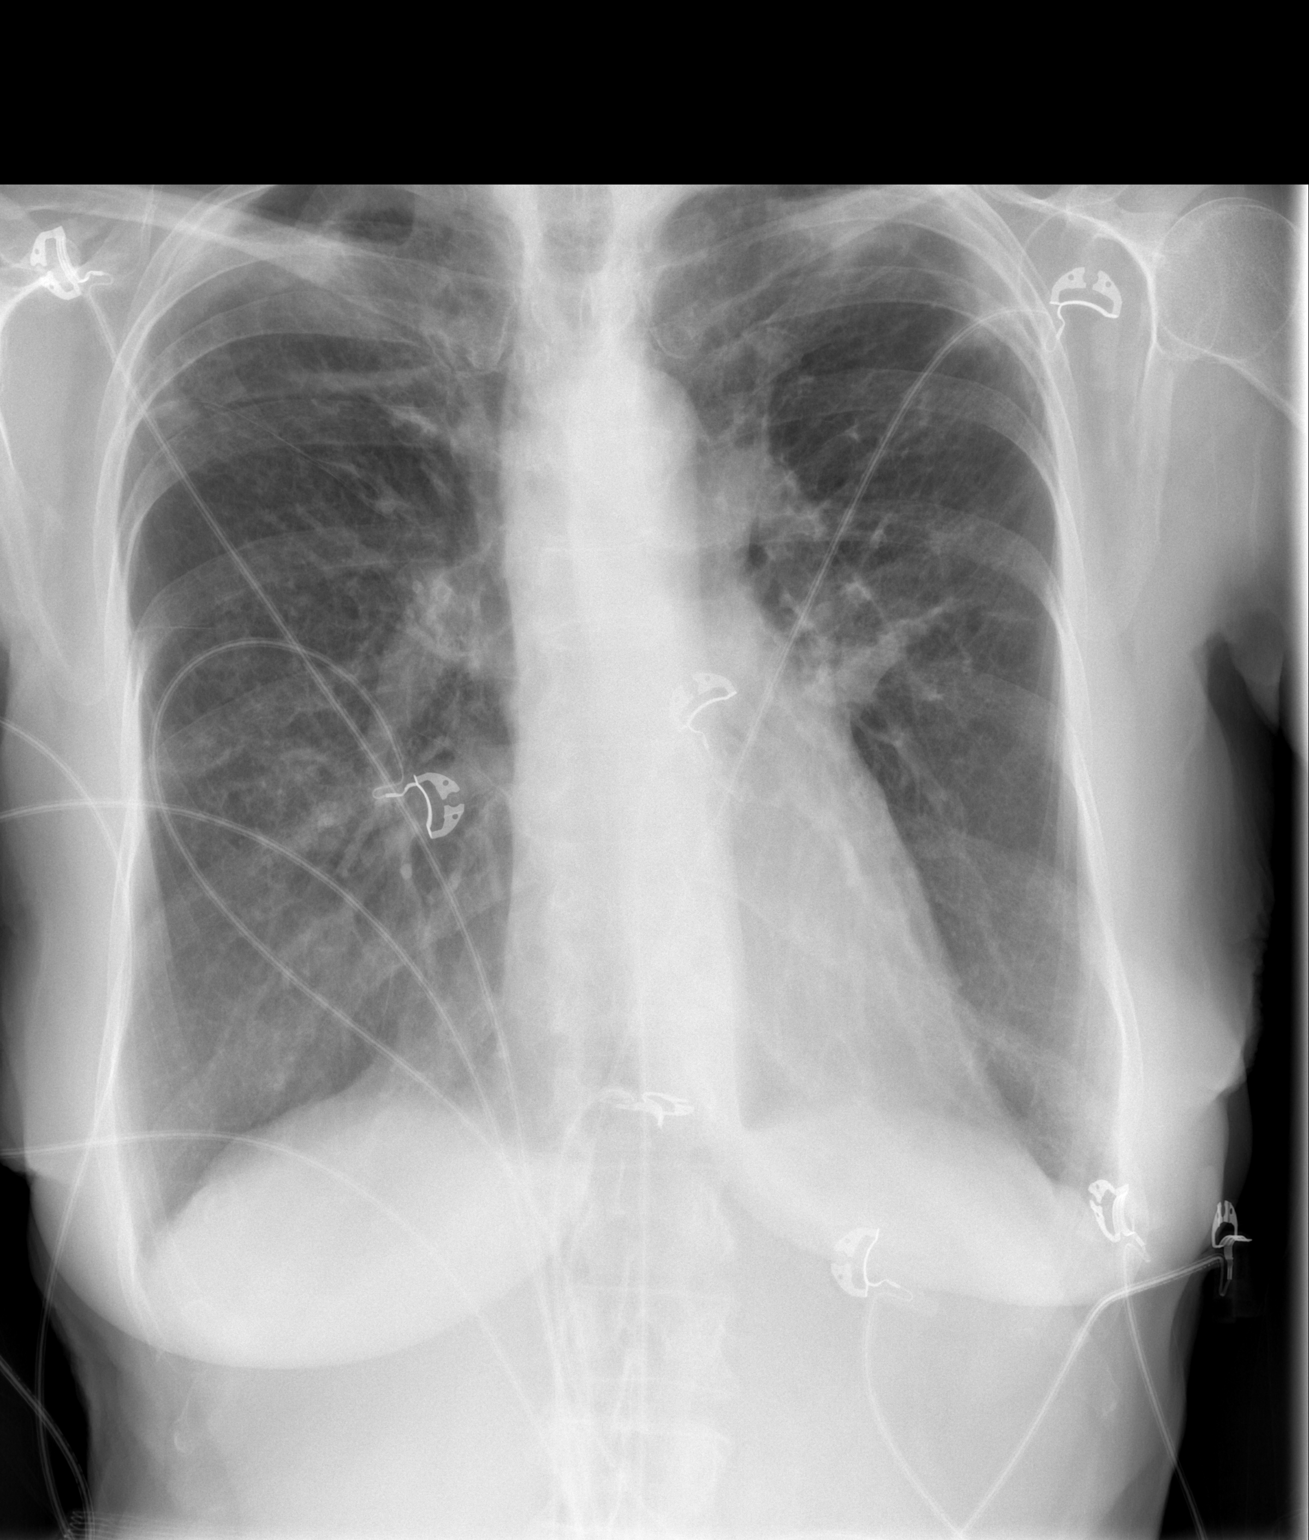

[w chest lat]
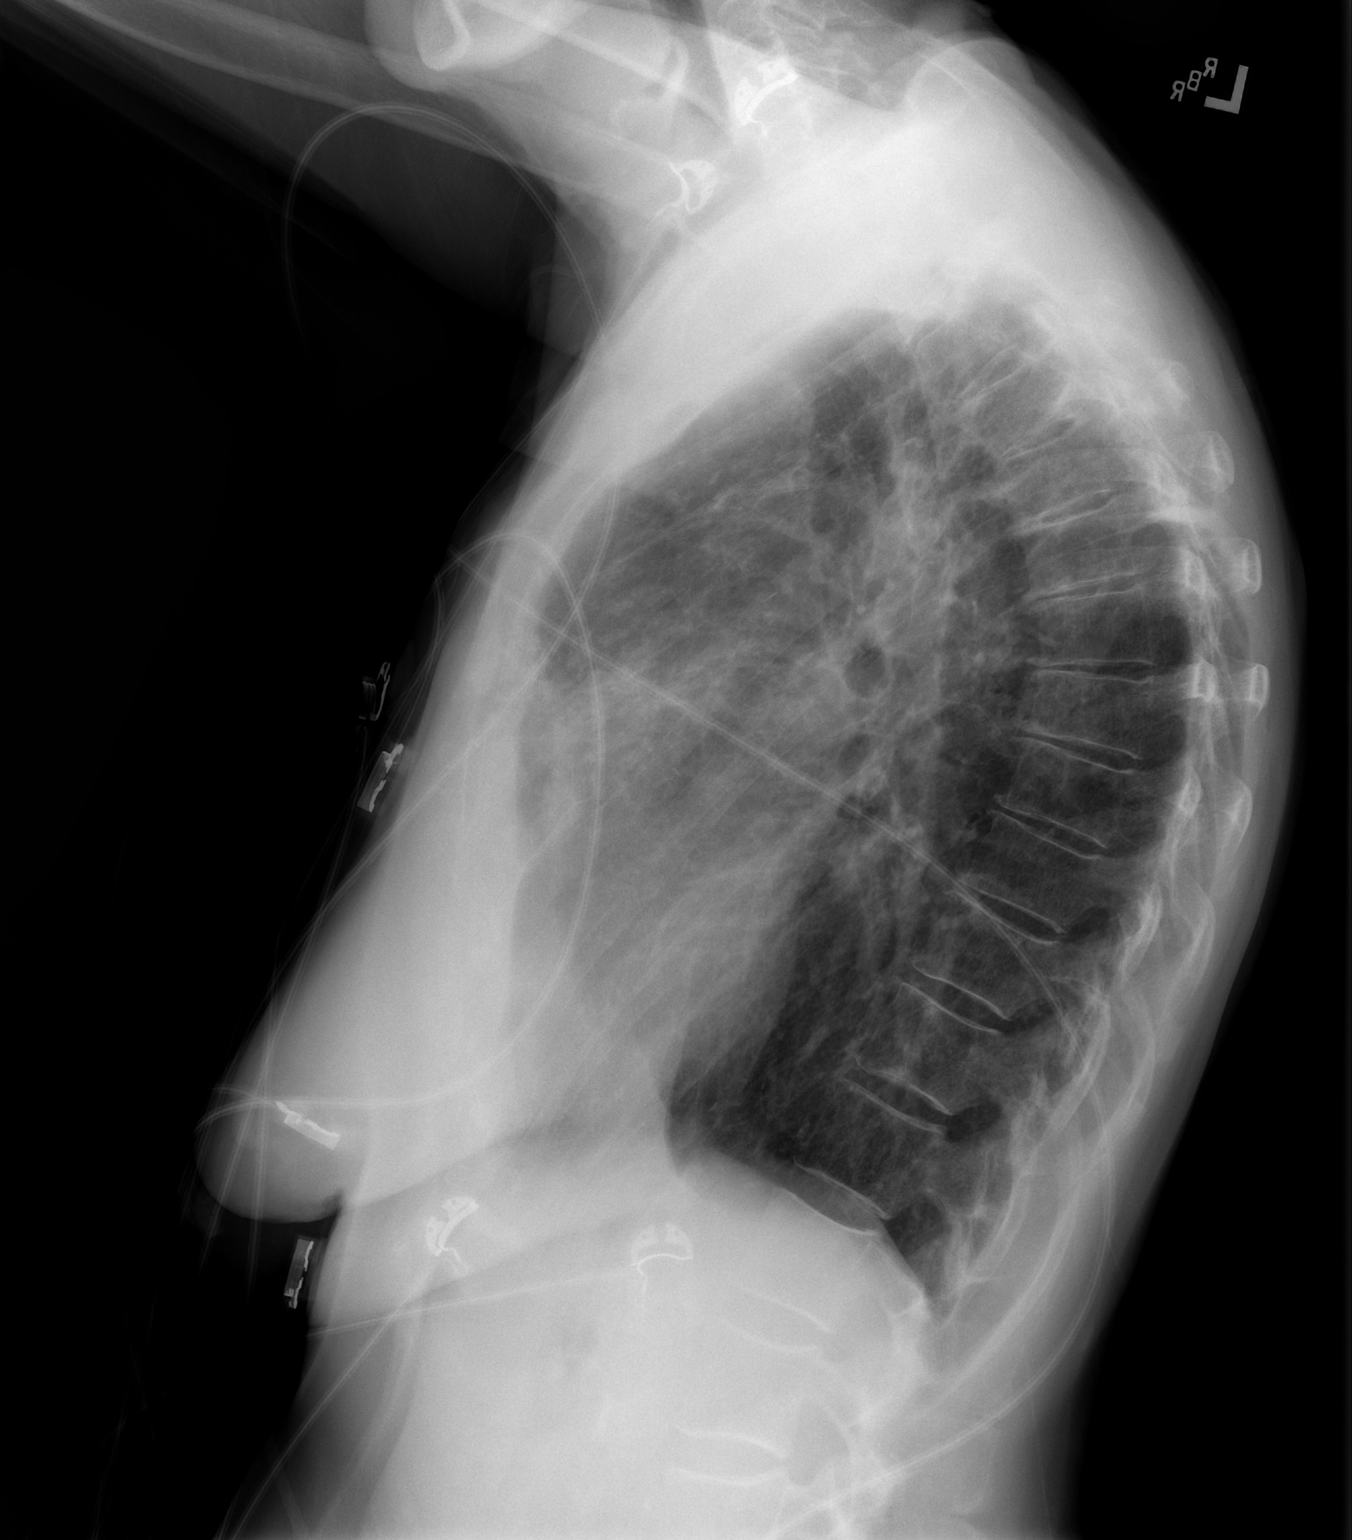

[2 of 2 positions shown; findings below may reference images not displayed]

FINDINGS: The heart size is normal. There is no edema or effusion.
Emphysematous changes are again noted. Chronic interstitial markings
compatible with previously suggested chronic indolent infection
within the right middle lobe and bilateral upper lobes is again
noted. There is no significant progression. No superimposed airspace
disease is evident. The visualized soft tissues and bony thorax are
unremarkable.
IMPRESSION: 1. No acute cardiopulmonary disease or significant interval change.
2. Stable chronic changes in the right middle lobe and upper lobes
bilaterally as described.
3. Emphysema.

## 2016-08-04 ENCOUNTER — Other Ambulatory Visit: Payer: Self-pay | Admitting: Cardiology

## 2016-08-31 ENCOUNTER — Telehealth: Payer: Self-pay | Admitting: Interventional Cardiology

## 2016-08-31 NOTE — Telephone Encounter (Signed)
New Message     Pt takes Eliquis , but she thinks its effecting her Urine, it turns dark yellow at times, then clear then dark again

## 2016-08-31 NOTE — Telephone Encounter (Signed)
Patient calling and states that her urine is dark yellow first thing in the morning and it becomes clear throughout the day. She wanted to know if it could be blood, since she is taking Eliquis. She states that there is no visible blood. She states that her urine is not red, pink-tinged, or brown, that it is a dark yellow only first thing in the morning. Patient advised that her urine would be more concentrated first thing in the morning and appear darker and lighten up later in the day. Patient advised to follow up with PCP if her symptoms worsened. Patient verbalized understanding and appreciated the call.

## 2016-09-27 ENCOUNTER — Encounter (INDEPENDENT_AMBULATORY_CARE_PROVIDER_SITE_OTHER): Payer: Self-pay

## 2016-09-27 ENCOUNTER — Ambulatory Visit (INDEPENDENT_AMBULATORY_CARE_PROVIDER_SITE_OTHER): Payer: PPO | Admitting: Cardiology

## 2016-09-27 VITALS — BP 124/72 | HR 55 | Ht 62.0 in | Wt 107.4 lb

## 2016-09-27 DIAGNOSIS — I481 Persistent atrial fibrillation: Secondary | ICD-10-CM

## 2016-09-27 DIAGNOSIS — I4819 Other persistent atrial fibrillation: Secondary | ICD-10-CM

## 2016-09-27 NOTE — Patient Instructions (Signed)
Medication Instructions:    Your physician recommends that you continue on your current medications as directed. Please refer to the Current Medication list given to you today.  Labwork:  None ordered  Testing/Procedures:  None ordered  Follow-Up:  Your physician wants you to follow-up in: 6 months with Dr. Camnitz.  You will receive a reminder letter in the mail two months in advance. If you don't receive a letter, please call our office to schedule the follow-up appointment.  - If you need a refill on your cardiac medications before your next appointment, please call your pharmacy.    Thank you for choosing [facility]!!   Krishika Bugge, RN [phone]         

## 2016-09-27 NOTE — Progress Notes (Signed)
Electrophysiology Office Note   Date:  09/27/2016   ID:  [name], DOB 1931-02-09, MRN [id number]  PCP:  Henrine Screws, MD  Cardiologist:  Christ Kick Primary Electrophysiologist:  Blade Scheff Meredith Leeds, MD    Chief Complaint  Patient presents with  . Atrial Fibrillation     History of Present Illness: [name] is a 83 y.o. female who presents today for electrophysiology evaluation.   History of paroxysmal atrial fibrillation and elevated CHA2DS2 VASc score of 3, on chronic anticoagulation with Eliquis. Had cardioversion 08/31/15 which was unsuccessful.  Flecainide was increased to 100 mg BID and she had attempted repeat cardioversion but was in sinus rhythm when she showed up to the hospital. Wore a holter monitor showing 18% PVCs but no atrial fibrillation with episodes of junctional rhythm and HR in the 20s during sleeping hours.   She is been feeling well without major complaint. She has no chest pain, shortness of breath, palpitations, PND, orthopnea, syncope, near-syncope, lower cavity edema. She does note occasional palpitations that she attributes to PVCs. She is able to do all of her daily activities. She is working to exercise more with increased walking. She is tolerating her medicine without issue.   Past Medical History:  Diagnosis Date  . Bronchiectasis ([place])    with multiple pneumonia back in 1990's  . Bronchitis    episodic  . DJD (degenerative joint disease) of knee   . GERD (gastroesophageal reflux disease)    episodic symptoms  . Hearing loss    chronic moderate hearing loss, worked up by ENT and hearing aids have been recommended  . History of nuclear stress test 11/07   NORMAL  . Insomnia   . PVC's (premature ventricular contractions)    on EKG, controlled on atenolol and caffeine reduction  . Tinnitus    chronic   Past Surgical History:  Procedure Laterality Date  . CARDIOVERSION N/A 08/31/2015   Procedure: CARDIOVERSION;  Surgeon: Jerline Pain, MD;  Location: [facility];  Service: Cardiovascular;  Laterality: N/A;  . cyst removed     left breast     Current Outpatient Prescriptions  Medication Sig Dispense Refill  . ADVAIR HFA 115-21 MCG/ACT inhaler INHALE 2 PUFFS INTO THE LUNGS TWICE DAILY 12 g 3  . amiodarone (PACERONE) 200 MG tablet Take 1 tablet (200 mg total) by mouth daily. 90 tablet 2  . apixaban (ELIQUIS) 2.5 MG TABS tablet Take 1 tablet (2.5 mg total) by mouth 2 (two) times daily. 60 tablet 5  . Cholecalciferol (VITAMIN D) 2000 UNITS CAPS Take 2,000 Units by mouth daily.    . clonazePAM (KLONOPIN) 0.5 MG tablet Take 1/2 to 1 tablet twice daily 60 tablet 5  . furosemide (LASIX) 40 MG tablet Take 40 mg by mouth daily as needed.    . sertraline (ZOLOFT) 50 MG tablet Take 50 mg by mouth daily.     No current facility-administered medications for this visit.     Allergies:   Augmentin [amoxicillin-pot clavulanate]; Biaxin [clarithromycin]; Metoprolol; and Tequin [gatifloxacin]   Social History:  The patient  reports that she quit smoking about 48 years ago. Her smoking use included Cigarettes. She has a 10.00 pack-year smoking history. She has never used smokeless tobacco. She reports that she drinks alcohol. She reports that she does not use drugs.   Family History:  The patient's family history includes Asthma in her sister; Clotting disorder in her sister; Hypertension in her father and mother; Stroke in  her father and mother.    ROS:  Please see the history of present illness.   Otherwise, review of systems is positive for none.   All other systems are reviewed and negative.     PHYSICAL EXAM: VS:  BP 124/72   Pulse (!) 55   Ht 5\' 2"  (1.575 m)   Wt 107 lb 6 oz (48.7 kg)   SpO2 95%   BMI 19.64 kg/m  , BMI Body mass index is 19.64 kg/m. GEN: Well nourished, well developed, in no acute distress  HEENT: normal  Neck: no JVD, carotid bruits, or masses Cardiac: RRR; no murmurs, rubs, or gallops,no edema   Respiratory:  clear to auscultation bilaterally, normal work of breathing GI: soft, nontender, nondistended, + BS MS: no deformity or atrophy  Skin: warm and dry Neuro:  Strength and sensation are intact Psych: euthymic mood, full affect   EKG:  EKG is ordered today. Personal review of the ekg ordered shows Sinus rhythm, left atrial enlargement, left anterior fascicular block, rate 55  Recent Labs: 10/15/2015: Brain Natriuretic Peptide 54.2 02/23/2016: BUN 15; Creat 0.75; Hemoglobin 13.6; Potassium 3.9; Sodium 143 07/06/2016: ALT 24; Platelets 142; TSH 1.170    Lipid Panel     Component Value Date/Time   CHOL 163 07/03/2015 0440   TRIG 73 07/03/2015 0440   HDL 57 07/03/2015 0440   CHOLHDL 2.9 07/03/2015 0440   VLDL 15 07/03/2015 0440   LDLCALC 91 07/03/2015 0440     Wt Readings from Last 3 Encounters:  09/27/16 107 lb 6 oz (48.7 kg)  06/07/16 107 lb 6.4 oz (48.7 kg)  05/02/16 106 lb (48.1 kg)      Other studies Reviewed: Additional studies/ records that were reviewed today include: TTE 07/03/15  Review of the above records today demonstrates:  - Left ventricle: The cavity size was normal. Systolic function was   normal. The estimated ejection fraction was in the range of 55%   to 60%. Wall motion was normal; there were no regional wall   motion abnormalities.  Holter 12/13/15  NSR, intermittent junctional rhythm.  PACs, PVCs.  Minimum HR 30 at 4AM, presumably during sleep.  No atrial fibrillation.  Holter 02/28/16 Minimum HR: 17 BPM at 3:44:42 AM Maximum HR: 114 BPM at 9:07:34 AM Average HR: 51 BPM 18.2% PVCs 2.9% APCs Sinus rhytnm Zero atrial fibrillation Occasional junctional rhythm during sleeping hours 4.8 second sinus pause during sleeping hours Chest pounding associated with ventricular bigeminy Longest ventriclar run 12 beats  ASSESSMENT AND PLAN:  1.  Parosysmal atrial fibrillation: On amiodarone and Eliquis. Was previously on diltiazem which was  stopped due to bradycardia. Remains in sinus rhythm. No medication changes at this time.  This patients CHA2DS2-VASc Score and unadjusted Ischemic Stroke Rate (% per year) is equal to 3.2 % stroke rate/year from a score of 3  Above score calculated as 1 point each if present [CHF, HTN, DM, Vascular=MI/PAD/Aortic Plaque, Age if 65-74, or Female] Above score calculated as 2 points each if present [Age > 75, or Stroke/TIA/TE]  2. PVCs: 18% as found on Holter monitor. Status post amiodarone load. Tolerating well without major issue.  Current medicines are reviewed at length with the patient today.   The patient has concerns regarding her medicines.  The following changes were made today:   Labs/ tests ordered today include:  Orders Placed This Encounter  Procedures  . EKG 12-Lead     Disposition:   FU with Lunabelle Oatley 6  months  Signed, Orlando Thalmann Meredith Leeds, MD  09/27/2016 2:34 PM     [facility] [address] [place] [address] [phone] (office) [phone] (fax)

## 2016-10-05 ENCOUNTER — Ambulatory Visit (INDEPENDENT_AMBULATORY_CARE_PROVIDER_SITE_OTHER): Payer: PPO | Admitting: Pulmonary Disease

## 2016-10-05 VITALS — BP 118/64 | HR 46 | Temp 97.0°F | Ht 62.0 in | Wt 106.5 lb

## 2016-10-05 DIAGNOSIS — J449 Chronic obstructive pulmonary disease, unspecified: Secondary | ICD-10-CM

## 2016-10-05 DIAGNOSIS — I480 Paroxysmal atrial fibrillation: Secondary | ICD-10-CM

## 2016-10-05 DIAGNOSIS — J479 Bronchiectasis, uncomplicated: Secondary | ICD-10-CM | POA: Diagnosis not present

## 2016-10-05 DIAGNOSIS — R0600 Dyspnea, unspecified: Secondary | ICD-10-CM

## 2016-10-05 DIAGNOSIS — R002 Palpitations: Secondary | ICD-10-CM

## 2016-10-05 NOTE — Patient Instructions (Signed)
Today we updated your med list in our EPIC system...    Continue your current medications the same...  Continue to eat well, maintain your nutrition, and EXERCISE (the walks are good)...  Call for any questions...  Let's plan a follow up visit in 79mo, sooner if needed for problems.Marland KitchenMarland Kitchen

## 2016-10-09 ENCOUNTER — Encounter: Payer: Self-pay | Admitting: Pulmonary Disease

## 2016-10-09 NOTE — Progress Notes (Signed)
Subjective:     Patient ID: [name], female   DOB: 1930-07-22, 83 y.o.   MRN: [id number]  HPI    83 y/o WF referred for dyspnea> she has bronchiectasis/ scarring, mod COPD w/ a revers component, NEG Quant Gold assay, and sput w/ Pseudomonas x1 & NEG AFB/ Fungi;  She is followed by [facility] for Cards w/ PAFib...   ~  December 28, 2014:  Initial pulmonary consult by SN>   49 y/o WF, referred by Dr. Mertha Mcdonald, for evaluation of dyspnea>>       [name] tells me that she was diagnosed w/ bronchiectasis ~15 yrs ago- notes that she had whooping cough as a child (but no known sequelae in childhood) and pneumonia x3 as an adult (in the 1990s she says);  She is not sure of her early course w/ her lung dis & records in Epic show old films from [place] office back to 2003 w/ similar features to current CXRs=> biapical pleuroparenchymal scarring with a +/- stable pattern;  CT Angiogram done 02/2006 showed neg for PE, biapical pleuroparenchymal scarring w/ fibronodular changes in RUL, RML, Lingula felt to represent likely post infectious scarring and NAD;  CXRs done 11/10/14 and 02/20/14 showed sl hyperinflation, flattening of diaphragms, stable biapical & upper lobe scarring, normal heart size, NAD...       Symptomatically she notes SOB/DOE- breathing harder when walking fast or exerting, going on for about 42yr & not really progressive (fairly stable dyspnea), ADLs are OK, prev walked on treadmill for 16min about 76yr ago but now not going to the gym;  She notes some chest tightness, a choking sensation in her throat, hard to get the air "IN" and a yawn seems to help temporarily;  She notes mild cough, sm amt green=>beige sput, w/o hemoptysis/ f/c/s... She is an ex-smoker having started in her teens, smoked off & on, up to 1/2 ppd, and quit in the 1980s- calc about a 15 pack-year smoking hx...       She is followed for CARDS by [name]> Hx CP & palpit w/ neg stress test and neg cath 2010; ?lifeline screen  showed Afib but never confirmed (also showed mild carotid dis); notes SOB, choking, palpit, & Holter in 2014 showed PACs & given Lorazepam by PCP w/ some relief; DrV checks her yearly- on ASA81 and Metop25Bid...       Current meds> Metop25Bid, Lorazepam0.5 prn, VitD supplement... EXAM reveals Afeb, VSS, O2sat=97% on RA;  HEENT- neg, mallampati1;  Chest- decr BS bilat, clear w/o w/r/r;  Heart- RR, gr1/6 SEM w/o r/g;  Abd- soft, neg;  Ext- neg w/o c/c/e...  Old CXRs in PACS> 2003/2007 showed biapical pleuroparenchymal scarring with a +/- stable pattern.   CT Angiogram done 02/2006 showed neg for PE, biapical pleuroparenchymal scarring w/ fibronodular changes in RUL, RML, lingula felt to represent likely post infectious scarring and NAD.   CXRs done 11/10/14 and 02/20/14 showed sl hyperinflation, flattening of diaphragms, stable biapical & upper lobe scarring, normal heart size, NAD (osteopenia & mid Tspine compression)...   PFT 11/20/14 showed FVC=1.80 (78%), FEV1=0.90 (53%), %1sec=50, mid-flows reduced at 31% predicted; post bronchodil there was a 19% improvement in FEV1; LungVols- TLC=4.66 (98%), RV=2.73 (117%), RV/TLC=59%; DLCO is reduced at 43% predicted...   LABS 12/28/14:  We checked BMet- wnl;  CBC- wnl w/ 2% eos;  Sed=16;  Quantiferon GOLD= NEG...  Hi-res CT Chest => sched for 01/05/15 & pending     IMP/PLAN>>  27 y/o woman  w/ mild smoking hx & quit 30 yrs ago; she has mod obstructive lung dis on PFT w/ reversible component AND decr DLCO; Labs appear neg & QuantGold is neg as well- still could have underlying bronchiectasis & poss atypic mycobactium, awaiting Hi-res CT Chest, consider getting sput for cult/ AFB; In any event her parenchymal dis does not appear to be very progressive;  REC- trial SYMBICORT160- 2sp Bid and we will recheck pt in 4-6 weeks...   ADDENDUM>>  CT Chest 01/05/15 showed norm heart size, atherosclerosis, sl dilated PA trunk at 3.5cm suggestion PAH, no adenopathy;  Lungs show  bilat patchy bronchiectasis & interstitial thickening w/ peribronchvasc nodularity (sl worse than 2007) and no ground glass, reticulation, honeycombing => we will check sput specimen for cult & AFB.Marland Kitchen. (2DEcho 06/2015 w/ PAsys=19) ADDENDUM>>  Sputum C&S 01/15/15 was pos for Pseudomonas (resist to Rocephin, sens to Cipro etc)- likely colonizer;  AFB smears are neg & culture pending...  ~  February 08, 2015:  6wk ROV w/ SN>  [name] reports that her breathing is improved, less SOB, notes that she walked a lot over the weekend at the [place] did well;  She denies much cough, sput, no hemoptysis, no CP, no f/c/s... She remains on Advair115-2spBid and Lorazepam 0.5mg  prn...     Mod obstructive lung dis w/ a revers component & GOLD Stage3 COPD on PFTs> on ADVAIR115-2spBid & she is improved w/ incr exercise tolerance    Bronchiectasis & pleuroparenchymal scarring on CT, r/o MAI infection/ Lady Windermere syndrome> AFB cult is neg x1    Pseudomonas colonization> the pseudomonas is sens to Cipro... EXAM reveals Afeb, VSS, O2sat=97% on RA;  HEENT- neg, mallampati1;  Chest- decr BS bilat, clear w/o w/r/r;  Heart- RR, gr1/6 SEM w/o r/g;  Abd- soft, neg;  Ext- neg w/o c/c/e... IMP/PLAN>>  [name] is stable=> improved, asked to continue the Advair115-2spBid; she has already had the 2016 Flu vaccine; we plan ROV in 3-4 moths, sooner if needed...  ~  June 10, 2015:  73mo ROV w/ SN>  [name] remains stable- breathing is good, no prob w/ winter weather so far, she remains active & denies cough, sput, hemoptysis; she has chr stable DOE & trying to exercise; she wonders if the Advair250-DPI inhaler would be better for her & we gave her a sample 7 new Rx so she can decide...      Mod obstructive lung dis w/ a revers component & GOLD Stage3 COPD on PFTs> on ADVAIR115-2spBid & she is improved w/ incr exercise tolerance    Bronchiectasis & pleuroparenchymal scarring on CT, r/o MAI infection/ Lady Windermere syndrome> AFB cult is  neg x1    Pseudomonas colonization> the pseudomonas is sens to Cipro... EXAM reveals Afeb, VSS, O2sat=96% on RA;  HEENT- neg, mallampati1;  Chest- decr BS bilat, clear w/o w/r/r;  Heart- RR, gr1/6 SEM w/o r/g;  Abd- soft, neg;  Ext- neg w/o c/c/e... IMP/PLAN>>  Rekia remains stable-- she will decide if she prefers the MDI vs the DPI inhaletr; asked to incr exercise program, call for any problems and we plan routine f/u 46mo...  ~  November 04, 2015:  70mo ROV w/ SN>  Kyran has had a lot of difficulty in the interval- AFib w/ rvr Feb2017 and Adm 1d for IV Cardizem drip, converted to NSR & disch on Metop50Bid & Eliquis2.5Bid;  Then she developed recurrent AFib on 08/31/15 & failed DCCV=> placed on Flecainide & converted to NSR;  She has followed  up w/ [name] 10/15/15 on Flecainide50Bid, CardizemCD120, Eliquis2.5Bid and Lasix40 prn; Labs were OK, BNP=54 and told to continue same...     She presents today w/ a ?2wk hx of SOB, ?choking sensation, heavy feeling in upper chest/throat area- hard for her to describe; it is intermittent- ?when noted, ?ppt, nothing relieves it;  She stopped her Advair several weeks ago on her own therefore restarted recently;  She denies CP or palp sensation, denies dizzy/ lightheaded/ syncope;  She denies cough/ sput/ hemoptysis/ etc;  When asked to describe her SOB further- she cannot, asked if it's hard to get the air "in" or "out" she says both;  Daughter adds that she's been under a lot of stress and quite anxious- ?had a reaction to Lorazepam months ago & DrGates switched her to Zoloft;  Exam today showed sl irreg HR and EKGs showed NSR but sinus pauses & then ?escape beats=> she did not sense these episodes on the EKG machine (not c/o dizzy/ lightheaded/ palpit/ etc)...    Mod obstructive lung dis w/ a revers component & GOLD Stage3 COPD on PFTs> on ADVAIR115-2spBid & she is improved w/ incr exercise tolerance    Bronchiectasis & pleuroparenchymal scarring on CT, r/o MAI infection/ Lady  Windermere syndrome> QuantGOLD is neg, AFB cult is neg x1    Pseudomonas colonization> the pseudomonas was sens to Cipro...    PAF>  eval & Rx per [name] on Flecainide50Bid, CardizemCD120, Eliquis2.5Bid and Lasix40 prn; EKG today shows NSR but assoc w/ few pauses (~1.5sec, HR down to ~40) EXAM reveals Afeb, VSS, O2sat=96% on RA;  HEENT- neg, mallampati1;  Chest- decr BS bilat, clear w/o w/r/r;  Heart- sl irregularity, gr1/6 SEM w/o r/g;  Abd- soft, neg;  Ext- neg w/o c/c/e...  EKG 07/02/15 w/ AFib & rvr, rate=130, poor R progression V1-3...  2DEcho 07/02/15>  Norm LV size & function w/ EF=55-60%, no regional wall motion abn, norm AoV, essentially norm MV w/ trivMR, norm LA/RA, norm RV w/ RVsys=19...  EKG after chemical cardioversion> NSR, rate=80, RAE, poor R prograssion V1-3  CXR 08/24/15 showed norm heart size, hyperexpansion w/ scarring both upper lobes & nodular opac in periph LUL, degen changes in Tspine...  EKG today 11/04/15>  NSR w/ sinus pauses up to 1.5sec documented (HR~40 in that interval)RAE, poor R progression V1-3... IMP/PLAN>>  I am not sure what is going on here; she is certainly quite anxious but puts up a good front for daughter; she does NOT describe the dyspnea as the classic "can't get a DB, can't get the air IN", but has a discomfort ?choking sensation in throat/ upper chest (?globus)- btw she denies any heartburn symptoms;  I'm concerned the symptoms could be a manifestation of an arrhythmia & I'm inclined to suggest a HOLTER monitor for further eval but I'd like to discuss w/ [name] first; she is NOT on a Benzo- just Zoloft now- & I am inclined to start KLONOPIN 0.5mg  tabs- 1/2 to 1 tab PO Bid...we plan ROV recheck in 35mo...  ~  December 02, 2015:  43mo ROV & Kaicee reports a prompt and complete response to the [facility] 0.5mg - 1/2RFX; this has eliminated her choking sensation, chest discomfort, etc and she is now resting well at night, awakes refreshed & feeling better; she  continues to deny palpit, dizziness, lightheadedness, near syncope, etc...  EXAM shows Afeb, VSS, O2sat=96% on RA;  HEENT- neg, mallampati1;  Chest- decr BS bilat, clear w/o w/r/r;  Heart- sl irregularity, gr1/6 SEM w/o r/g;  Abd- soft, neg;  Ext- neg w/o c/c/e...  EKG today 12/02/15> ?SBrady, rate 43, some variability in p wave, poor R prog V1-2... Longest pause=1.7sec on this EKG. IMP/PLAN>>  Curry is feeling much better on the Klonopin & we will continue same;  I am still concerned about the arryhthmia, long r-r interval, slow HR, p-wave morphology differs => decided to proceed w/ HOLTER MONITOR to be sure she is not having any more serious pauses/ arrhythmias...   ADDENDUM>>  48H Holter 12/13/15 showed NSR, intermittent junctional rhythm., PACs, PVCs. Minimum HR 30 at 4AM, No atrial fibrillation.=> referred to EP... Pt [facility] by [facility] 01/13/16>  Hx PAF on Eliquis, unsuccessful cardioversion 08/2015, treated w/ Flecainide & Diltiazem, Flec dose increased & she converted to NSR=> junctional rhythm & repeat Holter 02/28/16 showed NSR, PVCs, PACs, occas juntional rhythm & 4.8 sec pause while sleeping; palpit were due to bigeminy... They decided to STOP the Flecainide & diltiazem & start AMIODARONE...  ~  March 07, 2016:  77mo ROV w/ SN>  Deetta continues to do well on the Klonopin- breathing well now & just using 1/2 of the Klonopin prn (taking 3/7 days on ave)... She has been eval & followed by [facility] for EP- see above, now on AMIO;  She has a f/u appt w/ PCP- DrGates soon for a CPX & will get her FLU vaccine there... We reviewed the following medical problems during today's office visit >>     Dyspnea- multifactorial>  This improved on Klonopin trial taking 0.5mg - 1/2 to 1 tab bid...    Mod obstructive lung dis w/ a revers component & GOLD Stage3 COPD on PFTs> on ADVAIR115-2spBid & she is improved w/ incr exercise tolerance    Bronchiectasis & pleuroparenchymal scarring on CT, r/o MAI infection/ Lady  Windermere syndrome> QuantGOLD is neg, AFB cult is neg x1    Pseudomonas colonization> the pseudomonas was sens to Cipro...    Cardiac issues>  PAF & other arrhythmias as noted-  eval & Rx per [name] => DrCamnitz;  She has had mult EKGs and Holter checks, now on Amio & on-going eval & follow up.    Medical issues>  Hearing loss, tinnitus, GERD, DJD, depression & Insomnia; her PCP is DrGates... EXAM reveals Afeb, VSS, O2sat=96% on RA;  HEENT- neg, mallampati1;  Chest- decr BS bilat, clear w/o w/r/r;  Heart- sl irregularity, gr1/6 SEM w/o r/g;  Abd- soft, neg;  Ext- neg w/o c/c/e...  LABS 8-02/2016:  [place]- wnl;  CBC- wnl IMP/PLAN>>  Her breathing is improved, cardiac eval & Rx is on-going, continue current meds including the Advair115-2spBid & Klonopin 0.5mg  1/2-1 Bid prn...   ~  June 07, 2016:  82mo ROV w/ SN>  Daanya continues to do well- denies SOB, her breathing is good, no new complaints or concerns; she notes min cough & sput, no hemoptyis or discolored phlegm, no CP, she is doing her housework/ walking/ stairs/ etc... She remains on Advair115- 2spBid & Klonopin 0.5mg  1/2-1 Bid... 05/02/16 -- she saw [name] for CARDS f/u PAF on [place]; she had DCCV 08/2015 which was unsuccessful; she had Myoview & Holter- his note is reviewed...  We reviewed the following medical problems during today's office visit >>     Dyspnea- multifactorial>  This improved on Klonopin taking 0.5mg - 1/2 to 1 tab bid, she has increased her activities & doing satis...    Mod obstructive lung dis w/ a revers component & GOLD Stage3 COPD on PFTs> on [id number] &  she is improved w/ better exercise tolerance    Bronchiectasis & pleuroparenchymal scarring on CT, r/o MAI infection/ Lady Winder[place] syndrome> QuantGOLD is neg, AFB cult is neg x1    Pseudomonas colonization> the pseudomonas was sens to Cipro...    Cardiac issues>  PAF & other arrhythmias as noted-  eval & Rx per [name] => DrCamnitz;   She has had mult EKGs and Holter checks, now on Amio200/ Eliquis2.5Bid/ Lasix40    Medical issues>  Hearing loss, tinnitus, GERD, DJD, VitD de43fic (on 2000u daily)depression (on Zoloft50)& Insomnia; her PCP is DrGates... EXAM reveals Afeb, VSS, O2sat=96% on RA;  HEENT- neg, mallampati1;  Chest- decr BS bilat, clear w/o w/r/r;  Heart- sl irregularity, gr1/6 SEM w/o r/g;  Abd- soft, neg;  Ext- neg w/o c/c/e... IMP/PLAN>> Roosevelt remains stable on the current regimen- continue same & call for any problems...   ~  Oct 05, 2016:  46mo ROV & Anagabriela reports a good interval- "I can't complain" noting that her breathing is OK, denies SOB, exercing by walking & doing well she says; similarly denies cough, sputum, hemoptysis, CP, etc;  She is regular w/ her Advair115- 2spBid + Klonopin 0.5mg - 1/2 Bid...     She saw EP-CARDS- DrCamnitz 09/27/16>  Follow up AFib on Amio200, Eliquis2.5Bid, off diltiazem due to bradycardia; his note is reviewed; feeling well & asymptomatic x occas palpit=> PVCs; EKG showed SBrady, rate55, LAE & LAD;  No AFib on holter 10/17; rec to continue same 7 f/u 39mo...  We reviewed the following medical problems during today's office visit >>     Dyspnea- multifactorial>  This improved on Klonopin taking 0.5mg - 1/2 to 1 tab bid, she has increased her activities & doing satis...    Mod obstructive lung dis w/ a revers component & GOLD Stage3 COPD on PFTs> on ADVAIR115-2spBid & she is improved w/ better exercise tolerance    Bronchiectasis & pleuroparenchymal scarring on CT, r/o MAI infection/ Lady Windermere syndrome> QuantGOLD is neg, AFB cult is neg in 8/16...    Pseudomonas colonization> the pseudomonas grew on C&S from 8/16 & was sens to Cipro...    Cardiac issues>  PAF & other arrhythmias as noted (PVCs)-  eval & Rx per [name] => DrCamnitz;  She has had mult EKGs and Holter checks, now on Amio200/ Eliquis2.5Bid/ Lasix40prn    Medical issues>  Hearing loss, tinnitus, GERD, DJD, VitD defic (on  2000u daily)depression (on Zoloft50)& Insomnia; her PCP is DrGates... EXAM reveals Afeb, VSS, O2sat=94% on RA;  HEENT- neg, mallampati1;  Chest- decr BS bilat, clear w/o w/r/r;  Heart- sl irregularity, gr1/6 SEM w/o r/g;  Abd- soft, neg;  Ext- neg w/o c/c/e...  LABS in Epic from 2/18> LFTs are wnl;  CBC- ok w/ Hg=12.2;  TSH=1.17 IMP/PLAN>>  Chancie's dyspnea is stable on her current regimen & she is rec to continue same + incr exercise program as discussed;  She will maintain f/u w/ PCP-DrGates, and Cards-DrCamnitz; we plan ROV recheck in 51mo w/ f/u CXR etc...       NOTE:  >50% of this 82min ROV was spent in counseling 7 coordination of care...    Past Medical History:  Diagnosis Date  . Bronchiectasis ([place])    with multiple pneumonia back in 1990's  . Bronchitis    episodic  . DJD (degenerative joint disease) of knee   . GERD (gastroesophageal reflux disease)    episodic symptoms  . Hearing loss    chronic moderate hearing loss, worked up  by ENT and hearing aids have been recommended  . History of nuclear stress test 11/07   NORMAL  . Insomnia   . PVC's (premature ventricular contractions)    on EKG, controlled on atenolol and caffeine reduction  . Tinnitus    chronic    Past Surgical History:  Procedure Laterality Date  . CARDIOVERSION N/A 08/31/2015   Procedure: CARDIOVERSION;  Surgeon: Jerline Pain, MD;  [facility] ENDOSCOPY;  Service: Cardiovascular;  Laterality: N/A;  . cyst removed     left breast    Outpatient Encounter Prescriptions as of 10/05/2016  Medication Sig  . ADVAIR HFA 115-21 MCG/ACT inhaler INHALE 2 PUFFS INTO THE LUNGS TWICE DAILY  . amiodarone (PACERONE) 200 MG tablet Take 1 tablet (200 mg total) by mouth daily.  Marland Kitchen apixaban (ELIQUIS) 2.5 MG TABS tablet Take 1 tablet (2.5 mg total) by mouth 2 (two) times daily.  . Cholecalciferol (VITAMIN D) 2000 UNITS CAPS Take 2,000 Units by mouth daily.  . clonazePAM (KLONOPIN) 0.5 MG tablet Take 1/2 to 1 tablet twice  daily  . furosemide (LASIX) 40 MG tablet Take 40 mg by mouth daily as needed.  . sertraline (ZOLOFT) 50 MG tablet Take 50 mg by mouth daily.   No facility-administered encounter medications on file as of 10/05/2016.     Allergies  Allergen Reactions  . Augmentin [Amoxicillin-Pot Clavulanate] Nausea And Vomiting  . Biaxin [Clarithromycin] Nausea And Vomiting  . Metoprolol Other (See Comments)    Patient notes she "feels funny" after taking it  . Tequin [Gatifloxacin] Nausea Only    Immunization History  Administered Date(s) Administered  . Influenza, High Dose Seasonal PF 02/27/2016  . Influenza-Unspecified 03/01/2015  . Pneumococcal Conjugate-13 10/15/2013  . Pneumococcal-Unspecified 02/27/2016    Current Medications, Allergies, Past Medical History, Past Surgical History, Family History, and Social History were reviewed in [employer] record.   Review of Systems            All symptoms NEG except where BOLDED >>  Constitutional:  F/C/S, fatigue, anorexia, unexpected weight change. HEENT:  HA, visual changes, hearing loss, earache, nasal symptoms, sore throat, mouth sores, hoarseness. Resp:  cough, sputum, hemoptysis; SOB, tightness, wheezing. Cardio:  CP, palpit, DOE, orthopnea, edema. GI:  N/V/D/C, blood in stool; reflux, abd pain, distention, gas. GU:  dysuria, freq, urgency, hematuria, flank pain, voiding difficulty. MS:  joint pain, swelling, tenderness, decr ROM; neck pain, back pain, etc. Neuro:  HA, tremors, seizures, dizziness, syncope, weakness, numbness, gait abn. Skin:  suspicious lesions or skin rash. Heme:  adenopathy, bruising, bleeding. Psyche:  confusion, agitation, sleep disturbance, hallucinations, anxiety, depression suicidal.   Objective:   Physical Exam      Vital Signs:  Reviewed...   General:  WD, WN, 83 y/o WM in NAD; alert & oriented; pleasant & cooperative... HEENT:  [place]/AT; Conjunctiva- pink, Sclera- nonicteric, EOM-wnl,  PERRLA, EACs-clear, TMs-wnl; NOSE-clear; THROAT-clear & wnl.  Neck:  Supple w/ fairl ROM; no JVD; normal carotid impulses w/o bruits; no thyromegaly or nodules palpated; no lymphadenopathy.  Chest:  Sl decr BS at bases, clear to P & A without wheezes, rales, or signs of consolidation... Heart:  Regular Rhythm; norm S1 & S2 without murmurs, rubs, or gallops detected. Abdomen:  Soft & nontender- no guarding or rebound; normal bowel sounds; no organomegaly or masses palpated. Ext:  decrROM; without deformities +arthritic changes; no varicose veins, venous insuffic, or edema;  Pulses intact w/o bruits. Neuro:  No focal neuro deficits; sensory  testing normal; gait normal & balance OK. Derm:  No lesions noted; no rash etc. Lymph:  No cervical, supraclavicular, axillary, or inguinal adenopathy palpated.   Assessment:      IMP >>     Mod obstructive lung dis w/ a revers component & GOLD Stage3 COPD on PFTs> on ADVAIR115-2spBid & she was improved w/ incr exercise tolerance    Bronchiectasis & pleuroparenchymal scarring on CT, r/o MAI infection/ Lady Winder[place] syndrome> QuantGOLD is neg, AFB cult is neg x1    Pseudomonas colonization> the pseudomonas was sens to Cipro...    PAF>  eval & Rx per [name] on Flecainide50Bid, CardizemCD120, Eliquis2.5Bid and Lasix40 prn; EKG today shows NSR but assoc w/ few pauses (~1.7sec, HR down to ~40s)  PLAN >> 12/28/14>   44 y/o woman w/ mild smoking hx & quit 30 yrs ago; she has mod obstructive lung dis on PFT w/ reversible component AND decr DLCO; Labs appear neg & QuantGold is neg as well- still could have underlying bronchiectasis & poss atypic mycobactium, awaiting Hi-res CT Chest, consider getting sput for cult/ AFB; In any event her parenchymal dis does not appear to be very progressive;  REC- trial SYMBICORT160- 2sp Bid and we will recheck pt in 4-6 weeks. 02/08/15>   Keiran is stable=> improved, asked to continue the [place]; she has already had the  2016 Flu vaccine; we plan ROV in 3-4 moths, sooner if needed... 06/11/15>  Linnet remains stable-- she will decide if she prefers the MDI vs the DPI inhaletr; asked to incr exercise program, call for any problems and we plan routine f/u 50mo. 11/04/15>   She has had a lot of trouble w/ AFib; ?what is causing her dyspnea sensation (?cardiac arrhythmia, anxiety, chest wall factors); we will consider checking a holter monitor & start rx w/ Klonopin 0.5mg - 1/2to1tab Bid... 12/02/15>   Much improved on the Klonopin; still w/ slow HR, sl irreg w/ pauses, & we will proceed w/ HOLTER MONITOR... 06/07/16>   Meera remains stable on the current regimen- continue same & call for any problems... 10/05/16>   Chrystal's dyspnea is stable on her current regimen & she is rec to continue same + incr exercise program as discussed;  She will maintain f/u w/ PCP-DrGates, and Cards-DrCamnitz; we plan ROV recheck in 58mo w/ f/u CXR etc.   Plan:     Patient's Medications  New Prescriptions   No medications on file  Previous Medications   ADVAIR HFA 115-21 MCG/ACT INHALER    INHALE 2 PUFFS INTO THE LUNGS TWICE DAILY   AMIODARONE (PACERONE) 200 MG TABLET    Take 1 tablet (200 mg total) by mouth daily.   APIXABAN (ELIQUIS) 2.5 MG TABS TABLET    Take 1 tablet (2.5 mg total) by mouth 2 (two) times daily.   CHOLECALCIFEROL (VITAMIN D) 2000 UNITS CAPS    Take 2,000 Units by mouth daily.   CLONAZEPAM (KLONOPIN) 0.5 MG TABLET    Take 1/2 to 1 tablet twice daily   FUROSEMIDE (LASIX) 40 MG TABLET    Take 40 mg by mouth daily as needed.   SERTRALINE (ZOLOFT) 50 MG TABLET    Take 50 mg by mouth daily.  Modified Medications   No medications on file  Discontinued Medications   No medications on file

## 2016-11-02 ENCOUNTER — Encounter (INDEPENDENT_AMBULATORY_CARE_PROVIDER_SITE_OTHER): Payer: Self-pay

## 2016-11-02 ENCOUNTER — Encounter: Payer: Self-pay | Admitting: Interventional Cardiology

## 2016-11-02 ENCOUNTER — Ambulatory Visit (INDEPENDENT_AMBULATORY_CARE_PROVIDER_SITE_OTHER): Payer: PPO | Admitting: Interventional Cardiology

## 2016-11-02 VITALS — BP 160/66 | HR 56 | Ht 62.0 in | Wt 106.1 lb

## 2016-11-02 DIAGNOSIS — I8390 Asymptomatic varicose veins of unspecified lower extremity: Secondary | ICD-10-CM | POA: Diagnosis not present

## 2016-11-02 DIAGNOSIS — R600 Localized edema: Secondary | ICD-10-CM

## 2016-11-02 DIAGNOSIS — Z7901 Long term (current) use of anticoagulants: Secondary | ICD-10-CM | POA: Diagnosis not present

## 2016-11-02 DIAGNOSIS — I493 Ventricular premature depolarization: Secondary | ICD-10-CM

## 2016-11-02 DIAGNOSIS — I480 Paroxysmal atrial fibrillation: Secondary | ICD-10-CM | POA: Diagnosis not present

## 2016-11-02 NOTE — Progress Notes (Signed)
Cardiology Office Note   Date:  11/02/2016   ID:  [name], DOB 25-Dec-1930, MRN [id number]  PCP:  Josetta Huddle, MD    No chief complaint on file.    Wt Readings from Last 3 Encounters:  11/02/16 106 lb 1.9 oz (48.1 kg)  10/05/16 106 lb 8 oz (48.3 kg)  09/27/16 107 lb 6 oz (48.7 kg)       History of Present Illness: [name] is a 83 y.o. female  with a history of paroxysmal atrial fibrillation and elevated CHA2DS2 VASc score of 3, on chronic anticoagulation with Eliquis.  She has been on Flecainide in the past.   Patient had an ETT in 4/17:  Blood pressure demonstrated a hypertensive response to exercise.  No T wave inversion was noted during stress.  There was no ST segment deviation noted during stress.  Arrhythmias during stress: rare PVCs and short run of 6 beats of a-fib  Blood pressure demonstrated a hypertensive response to exercise  Overall, the patient's exercise capacity was mildly impaired.  Blood pressure demonstrated a hypertensive response to exercise.  [facility] Treadmill Score: low risk  In 2017, She did have a Holter monitor that showed PVCs, and junctional rhythm during sleeping hours with up to 4. second pauses. At times her heart rate dipped into the teens and 20s. Her diltiazem was stopped and she is felt much better since then. Her amiodarone dose was also decreased to 200 mg a day which has helped with her symptoms.  She was seen by EP, Dr. Curt Bears.  She was switched to Amiodarone for maintenance of NSR.  Denies : Chest pain. Dizziness. Leg edema. Nitroglycerin use. Orthopnea. Paroxysmal nocturnal dyspnea. Shortness of breath. Syncope.   She walks regularly, 30-40 minutes daily when the weather is good.  No sx with exertion.  SHe is much more active now.     Past Medical History:  Diagnosis Date  . Bronchiectasis ([place])    with multiple pneumonia back in 1990's  . Bronchitis    episodic  . DJD (degenerative joint disease) of  knee   . GERD (gastroesophageal reflux disease)    episodic symptoms  . Hearing loss    chronic moderate hearing loss, worked up by ENT and hearing aids have been recommended  . History of nuclear stress test 11/07   NORMAL  . Insomnia   . PVC's (premature ventricular contractions)    on EKG, controlled on atenolol and caffeine reduction  . Tinnitus    chronic    Past Surgical History:  Procedure Laterality Date  . CARDIOVERSION N/A 08/31/2015   Procedure: CARDIOVERSION;  Surgeon: Jerline Pain, MD;  Location: [facility];  Service: Cardiovascular;  Laterality: N/A;  . cyst removed     left breast     Current Outpatient Prescriptions  Medication Sig Dispense Refill  . ADVAIR HFA 115-21 MCG/ACT inhaler INHALE 2 PUFFS INTO THE LUNGS TWICE DAILY 12 g 3  . amiodarone (PACERONE) 200 MG tablet Take 1 tablet (200 mg total) by mouth daily. 90 tablet 2  . apixaban (ELIQUIS) 2.5 MG TABS tablet Take 1 tablet (2.5 mg total) by mouth 2 (two) times daily. 60 tablet 5  . Cholecalciferol (VITAMIN D) 2000 UNITS CAPS Take 2,000 Units by mouth daily.    . clonazePAM (KLONOPIN) 0.5 MG tablet Take 1/2 to 1 tablet twice daily 60 tablet 5  . furosemide (LASIX) 40 MG tablet Take 40 mg by mouth daily as needed.    Marland Kitchen  sertraline (ZOLOFT) 50 MG tablet Take 50 mg by mouth daily.     No current facility-administered medications for this visit.     Allergies:   Augmentin [amoxicillin-pot clavulanate]; Biaxin [clarithromycin]; Metoprolol; and Tequin [gatifloxacin]    Social History:  The patient  reports that she quit smoking about 48 years ago. Her smoking use included Cigarettes. She has a 10.00 pack-year smoking history. She has never used smokeless tobacco. She reports that she drinks alcohol. She reports that she does not use drugs.   Family History:  The patient's family history includes Asthma in her sister; Clotting disorder in her sister; Hypertension in her father and mother; Stroke in her father  and mother.    ROS:  Please see the history of present illness.   Otherwise, review of systems are positive for occasional dark urine.   All other systems are reviewed and negative.    PHYSICAL EXAM: VS:  BP (!) 160/66   Pulse (!) 56   Ht 5\' 2"  (1.575 m)   Wt 106 lb 1.9 oz (48.1 kg)   SpO2 94%   BMI 19.41 kg/m  , BMI Body mass index is 19.41 kg/m. GEN: Well nourished, well developed, in no acute distress  HEENT: normal  Neck: no JVD, carotid bruits, or masses Cardiac: RRR; no murmurs, rubs, or gallops,no edema ; occasional prematur ebeats Respiratory:  clear to auscultation bilaterally, normal work of breathing GI: soft, nontender, nondistended, + BS MS: no deformity or atrophy  Skin: warm and dry, no rash Neuro:  Strength and sensation are intact Psych: euthymic mood, full affect   EKG:   The ekg ordered on 09/27/16 demonstrates NSR, no ST changes   Recent Labs: 02/23/2016: BUN 15; Creat 0.75; Potassium 3.9; Sodium 143 07/06/2016: ALT 24; Hemoglobin 12.2; Platelets 142; TSH 1.170   Lipid Panel    Component Value Date/Time   CHOL 163 07/03/2015 0440   TRIG 73 07/03/2015 0440   HDL 57 07/03/2015 0440   CHOLHDL 2.9 07/03/2015 0440   VLDL 15 07/03/2015 0440   LDLCALC 91 07/03/2015 0440     Other studies Reviewed: Additional studies/ records that were reviewed today with results demonstrating: 2/17: Normal LVEF, normal valvular function.   ASSESSMENT AND PLAN:  1. PAF: Off Diltiazem.  COntinue Amiodarone.  SHe will need TSH, LFTs and CBC every 6 months- on Eliquis and Amio. Check in August 2018.  Normal in 2/18. 2. LE edema: controlled.  Varicose veins, L>R.  Not painful.  No further eval for any procedures given the lack of sx. 3. PVCs: 18% PVCs in the past by monitor.  Still has occasional palpitations but nothing sustained. 4. Stay well hydrated and this should help the intermittent dark urine.   Current medicines are reviewed at length with the patient today.  The  patient concerns regarding her medicines were addressed.  The following changes have been made:  No change  Labs/ tests ordered today include:  No orders of the defined types were placed in this encounter.   Recommend 150 minutes/week of aerobic exercise Low fat, low carb, high fiber diet recommended  Disposition:   FU in 1 year; she also will f/u with EP in 11/18.   Signed, Larae Grooms, MD  11/02/2016 1:59 PM    [facility] [place], [place], [place]  [address] Phone: [phone]; Fax: [phone]

## 2016-11-02 NOTE — Patient Instructions (Signed)
Medication Instructions:  Your physician recommends that you continue on your current medications as directed. Please refer to the Current Medication list given to you today.   Labwork: Your physician recommends that you return for lab work in August for TSH and LFTs.   Testing/Procedures: None ordered  Follow-Up: Your physician wants you to follow-up in: 1 year with Dr. Irish Lack. You will receive a reminder letter in the mail two months in advance. If you don't receive a letter, please call our office to schedule the follow-up appointment.   Any Other Special Instructions Will Be Listed Below (If Applicable).     If you need a refill on your cardiac medications before your next appointment, please call your pharmacy.

## 2016-11-15 DIAGNOSIS — H25813 Combined forms of age-related cataract, bilateral: Secondary | ICD-10-CM | POA: Diagnosis not present

## 2016-11-19 ENCOUNTER — Other Ambulatory Visit: Payer: Self-pay | Admitting: Pulmonary Disease

## 2016-11-21 ENCOUNTER — Other Ambulatory Visit: Payer: Self-pay | Admitting: Pulmonary Disease

## 2016-11-21 NOTE — Telephone Encounter (Signed)
Please advise on refill for Clonazepam.

## 2016-12-18 ENCOUNTER — Other Ambulatory Visit: Payer: Self-pay

## 2016-12-18 MED ORDER — APIXABAN 2.5 MG PO TABS: 2.5000 mg | ORAL_TABLET | Freq: Two times a day (BID) | ORAL | 3 refills | Status: DC

## 2016-12-27 ENCOUNTER — Other Ambulatory Visit: Payer: PPO | Admitting: *Deleted

## 2016-12-27 DIAGNOSIS — I480 Paroxysmal atrial fibrillation: Secondary | ICD-10-CM

## 2016-12-28 LAB — HEPATIC FUNCTION PANEL
ALT: 20 IU/L (ref 0–32)
AST: 27 IU/L (ref 0–40)
Albumin: 3.9 g/dL (ref 3.5–4.7)
Alkaline Phosphatase: 84 IU/L (ref 39–117)
BILIRUBIN TOTAL: 0.4 mg/dL (ref 0.0–1.2)
Bilirubin, Direct: 0.14 mg/dL (ref 0.00–0.40)
Total Protein: 6.3 g/dL (ref 6.0–8.5)

## 2016-12-28 LAB — TSH: TSH: 1.55 u[IU]/mL (ref 0.450–4.500)

## 2017-01-01 ENCOUNTER — Telehealth: Payer: Self-pay | Admitting: Interventional Cardiology

## 2017-01-01 NOTE — Telephone Encounter (Signed)
Reviewed lab results with patient's daughter who verbalized understanding and was grateful for the call.

## 2017-01-01 NOTE — Telephone Encounter (Signed)
Mrs. [name]( Daughter) is returning a call about Mrs. [name] test results . Please call

## 2017-01-12 ENCOUNTER — Other Ambulatory Visit: Payer: Self-pay | Admitting: Internal Medicine

## 2017-01-12 DIAGNOSIS — Z1231 Encounter for screening mammogram for malignant neoplasm of breast: Secondary | ICD-10-CM

## 2017-02-15 ENCOUNTER — Ambulatory Visit
Admission: RE | Admit: 2017-02-15 | Discharge: 2017-02-15 | Disposition: A | Discharge status: Home / Self Care | Payer: PPO | Source: Ambulatory Visit | Attending: Internal Medicine | Admitting: Internal Medicine

## 2017-02-15 DIAGNOSIS — Z1231 Encounter for screening mammogram for malignant neoplasm of breast: Secondary | ICD-10-CM

## 2017-03-20 ENCOUNTER — Other Ambulatory Visit: Payer: Self-pay | Admitting: Pulmonary Disease

## 2017-03-27 NOTE — Progress Notes (Signed)
Electrophysiology Office Note   Date:  03/28/2017   ID:  [name], DOB 02-14-1931, MRN [id number]  PCP:  Josetta Huddle, MD  Cardiologist:  Christ Kick Primary Electrophysiologist:  Chanie Soucek Meredith Leeds, MD    Chief Complaint  Patient presents with  . Follow-up    Persistent Afib     History of Present Illness: [name] is a 83 y.o. female who presents today for electrophysiology evaluation.   History of paroxysmal atrial fibrillation and elevated CHA2DS2 VASc score of 3, on chronic anticoagulation with Eliquis. Had cardioversion 08/31/15 which was unsuccessful.  Flecainide was increased to 100 mg BID and she had attempted repeat cardioversion but was in sinus rhythm when she showed up to the hospital. Wore a holter monitor showing 18% PVCs but no atrial fibrillation with episodes of junctional rhythm and HR in the 20s during sleeping hours.   Today, denies symptoms of palpitations, chest pain, shortness of breath, orthopnea, PND, lower extremity edema, claudication, dizziness, presyncope, syncope, bleeding, or neurologic sequela. The patient is tolerating medications without difficulties.  She is been feeling well without major issues.  She has not had any further atrial fibrillation.  She has noted some red tinged urine.  This is happened a few times over the last few weeks.   Past Medical History:  Diagnosis Date  . Bronchiectasis ([place])    with multiple pneumonia back in 1990's  . Bronchitis    episodic  . DJD (degenerative joint disease) of knee   . GERD (gastroesophageal reflux disease)    episodic symptoms  . Hearing loss    chronic moderate hearing loss, worked up by ENT and hearing aids have been recommended  . History of nuclear stress test 11/07   NORMAL  . Insomnia   . PVC's (premature ventricular contractions)    on EKG, controlled on atenolol and caffeine reduction  . Tinnitus    chronic   Past Surgical History:  Procedure Laterality Date  . BREAST  EXCISIONAL BIOPSY Left   . CARDIOVERSION N/A 08/31/2015   Procedure: CARDIOVERSION;  Surgeon: Jerline Pain, MD;  Location: [facility];  Service: Cardiovascular;  Laterality: N/A;  . cyst removed     left breast     Current Outpatient Prescriptions  Medication Sig Dispense Refill  . ADVAIR HFA 115-21 MCG/ACT inhaler INHALE 2 PUFFS INTO THE LUNGS TWICE DAILY 12 g 3  . amiodarone (PACERONE) 200 MG tablet Take 1 tablet (200 mg total) by mouth daily. 90 tablet 2  . apixaban (ELIQUIS) 2.5 MG TABS tablet Take 1 tablet (2.5 mg total) by mouth 2 (two) times daily. 60 tablet 3  . clonazePAM (KLONOPIN) 0.5 MG tablet TAKE 1/2-1 TABLET BY MOUTH TWICE DAILY 60 tablet 5  . furosemide (LASIX) 40 MG tablet Take 40 mg by mouth daily as needed.    . sertraline (ZOLOFT) 50 MG tablet Take 50 mg by mouth daily.     No current facility-administered medications for this visit.     Allergies:   Augmentin [amoxicillin-pot clavulanate]; Biaxin [clarithromycin]; Metoprolol; and Tequin [gatifloxacin]   Social History:  The patient  reports that she quit smoking about 48 years ago. Her smoking use included Cigarettes. She has a 10.00 pack-year smoking history. She has never used smokeless tobacco. She reports that she drinks alcohol. She reports that she does not use drugs.   Family History:  The patient's family history includes Asthma in her sister; Clotting disorder in her sister; Hypertension in her father and  mother; Stroke in her father and mother.    ROS:  Please see the history of present illness.   Otherwise, review of systems is positive for none.   All other systems are reviewed and negative.   PHYSICAL EXAM: VS:  BP 138/76   Pulse (!) 49   Ht 5\' 2"  (1.575 m)   Wt 104 lb (47.2 kg)   BMI 19.02 kg/m  , BMI Body mass index is 19.02 kg/m. GEN: Well nourished, well developed, in no acute distress  HEENT: normal  Neck: no JVD, carotid bruits, or masses Cardiac: RRR; no murmurs, rubs, or gallops,no  edema  Respiratory:  clear to auscultation bilaterally, normal work of breathing GI: soft, nontender, nondistended, + BS MS: no deformity or atrophy  Skin: warm and dry Neuro:  Strength and sensation are intact Psych: euthymic mood, full affect  EKG:  EKG is ordered today. Personal review of the ekg ordered shows sinus rhythm, rate 49, LAER, LAD, rate 49  Recent Labs: 07/06/2016: Hemoglobin 12.2; Platelets 142 12/27/2016: ALT 20; TSH 1.550    Lipid Panel     Component Value Date/Time   CHOL 163 07/03/2015 0440   TRIG 73 07/03/2015 0440   HDL 57 07/03/2015 0440   CHOLHDL 2.9 07/03/2015 0440   VLDL 15 07/03/2015 0440   LDLCALC 91 07/03/2015 0440     Wt Readings from Last 3 Encounters:  03/28/17 104 lb (47.2 kg)  11/02/16 106 lb 1.9 oz (48.1 kg)  10/05/16 106 lb 8 oz (48.3 kg)      Other studies Reviewed: Additional studies/ records that were reviewed today include: TTE 07/03/15  Review of the above records today demonstrates:  - Left ventricle: The cavity size was normal. Systolic function was   normal. The estimated ejection fraction was in the range of 55%   to 60%. Wall motion was normal; there were no regional wall   motion abnormalities.  Holter 12/13/15  NSR, intermittent junctional rhythm.  PACs, PVCs.  Minimum HR 30 at 4AM, presumably during sleep.  No atrial fibrillation.  Holter 02/28/16 Minimum HR: 17 BPM at 3:44:42 AM Maximum HR: 114 BPM at 9:07:34 AM Average HR: 51 BPM 18.2% PVCs 2.9% APCs Sinus rhytnm Zero atrial fibrillation Occasional junctional rhythm during sleeping hours 4.8 second sinus pause during sleeping hours Chest pounding associated with ventricular bigeminy Longest ventriclar run 12 beats  ASSESSMENT AND PLAN:  1.  Parosysmal atrial fibrillation: On amiodarone and Eliquis.  Has been on diltiazem but was stopped due to bradycardia.  [name] rhythm.  No changes at this time.  This patients CHA2DS2-VASc Score and unadjusted Ischemic  Stroke Rate (% per year) is equal to 3.2 % stroke rate/year from a score of 3  Above score calculated as 1 point each if present [CHF, HTN, DM, Vascular=MI/PAD/Aortic Plaque, Age if 65-74, or Female] Above score calculated as 2 points each if present [Age > 75, or Stroke/TIA/TE]  2. PVCs: 18% found on Holter monitor.  Status post amiodarone load.  No palpitations.  Tolerating well.  3.  Red tinged urine: Unclear the cause.  Mattisyn Cardona obtain a urinalysis today.  Current medicines are reviewed at length with the patient today.   The patient has concerns regarding her medicines.  The following changes were made today: None  Labs/ tests ordered today include:  Orders Placed This Encounter  Procedures  . Flu Vaccine QUAD 36+ mos IM  . Urinalysis, Routine w reflex microscopic  . EKG 12-Lead  Disposition:   FU with Zoiee Wimmer 6  months  Signed, Jazzlin Clements Meredith Leeds, MD  03/28/2017 1:29 PM     [address] [place] [address] [phone] (office) [phone] (fax)

## 2017-03-28 ENCOUNTER — Encounter: Payer: Self-pay | Admitting: Cardiology

## 2017-03-28 ENCOUNTER — Ambulatory Visit (INDEPENDENT_AMBULATORY_CARE_PROVIDER_SITE_OTHER): Payer: PPO | Admitting: Cardiology

## 2017-03-28 VITALS — BP 138/76 | HR 49 | Ht 62.0 in | Wt 104.0 lb

## 2017-03-28 DIAGNOSIS — R82998 Other abnormal findings in urine: Secondary | ICD-10-CM | POA: Diagnosis not present

## 2017-03-28 DIAGNOSIS — I481 Persistent atrial fibrillation: Secondary | ICD-10-CM | POA: Diagnosis not present

## 2017-03-28 DIAGNOSIS — I4819 Other persistent atrial fibrillation: Secondary | ICD-10-CM

## 2017-03-28 DIAGNOSIS — Z23 Encounter for immunization: Secondary | ICD-10-CM

## 2017-03-28 NOTE — Patient Instructions (Addendum)
Medication Instructions:  Your physician recommends that you continue on your current medications as directed. Please refer to the Current Medication list given to you today.  -- If you need a refill on your cardiac medications before your next appointment, please call your pharmacy. --  Labwork: Urinalysis today  Testing/Procedures: None ordered  Follow-Up: Your physician wants you to follow-up in: 6 months with Dr. Curt Bears.  You will receive a reminder letter in the mail two months in advance. If you don't receive a letter, please call our office to schedule the follow-up appointment.  Thank you for choosing [facility]!!   Trinidad Curet, RN [phone]

## 2017-03-29 LAB — URINALYSIS, ROUTINE W REFLEX MICROSCOPIC
BILIRUBIN UA: NEGATIVE
Glucose, UA: NEGATIVE
KETONES UA: NEGATIVE
LEUKOCYTES UA: NEGATIVE
Nitrite, UA: NEGATIVE
PH UA: 5 (ref 5.0–7.5)
PROTEIN UA: NEGATIVE
RBC UA: NEGATIVE
SPEC GRAV UA: 1.019 (ref 1.005–1.030)
Urobilinogen, Ur: 1 mg/dL (ref 0.2–1.0)

## 2017-04-02 DIAGNOSIS — Z1389 Encounter for screening for other disorder: Secondary | ICD-10-CM | POA: Diagnosis not present

## 2017-04-02 DIAGNOSIS — Z0000 Encounter for general adult medical examination without abnormal findings: Secondary | ICD-10-CM | POA: Diagnosis not present

## 2017-04-04 DIAGNOSIS — R634 Abnormal weight loss: Secondary | ICD-10-CM | POA: Diagnosis not present

## 2017-04-04 DIAGNOSIS — I10 Essential (primary) hypertension: Secondary | ICD-10-CM | POA: Diagnosis not present

## 2017-04-04 DIAGNOSIS — R413 Other amnesia: Secondary | ICD-10-CM | POA: Diagnosis not present

## 2017-04-04 DIAGNOSIS — E538 Deficiency of other specified B group vitamins: Secondary | ICD-10-CM | POA: Diagnosis not present

## 2017-04-04 DIAGNOSIS — I4891 Unspecified atrial fibrillation: Secondary | ICD-10-CM | POA: Diagnosis not present

## 2017-04-04 DIAGNOSIS — E559 Vitamin D deficiency, unspecified: Secondary | ICD-10-CM | POA: Diagnosis not present

## 2017-04-04 DIAGNOSIS — C44219 Basal cell carcinoma of skin of left ear and external auricular canal: Secondary | ICD-10-CM | POA: Diagnosis not present

## 2017-04-04 DIAGNOSIS — J479 Bronchiectasis, uncomplicated: Secondary | ICD-10-CM | POA: Diagnosis not present

## 2017-04-04 DIAGNOSIS — C44212 Basal cell carcinoma of skin of right ear and external auricular canal: Secondary | ICD-10-CM | POA: Diagnosis not present

## 2017-04-04 DIAGNOSIS — I479 Paroxysmal tachycardia, unspecified: Secondary | ICD-10-CM | POA: Diagnosis not present

## 2017-04-04 DIAGNOSIS — J439 Emphysema, unspecified: Secondary | ICD-10-CM | POA: Diagnosis not present

## 2017-04-04 DIAGNOSIS — F419 Anxiety disorder, unspecified: Secondary | ICD-10-CM | POA: Diagnosis not present

## 2017-04-08 DIAGNOSIS — I4891 Unspecified atrial fibrillation: Secondary | ICD-10-CM | POA: Diagnosis not present

## 2017-04-08 DIAGNOSIS — J439 Emphysema, unspecified: Secondary | ICD-10-CM | POA: Diagnosis not present

## 2017-04-08 DIAGNOSIS — E782 Mixed hyperlipidemia: Secondary | ICD-10-CM | POA: Diagnosis not present

## 2017-04-08 DIAGNOSIS — I10 Essential (primary) hypertension: Secondary | ICD-10-CM | POA: Diagnosis not present

## 2017-04-08 DIAGNOSIS — M1990 Unspecified osteoarthritis, unspecified site: Secondary | ICD-10-CM | POA: Diagnosis not present

## 2017-04-08 DIAGNOSIS — J479 Bronchiectasis, uncomplicated: Secondary | ICD-10-CM | POA: Diagnosis not present

## 2017-04-10 ENCOUNTER — Ambulatory Visit: Payer: PPO | Admitting: Pulmonary Disease

## 2017-04-10 ENCOUNTER — Encounter: Payer: Self-pay | Admitting: Pulmonary Disease

## 2017-04-10 VITALS — BP 130/78 | HR 51 | Ht 62.0 in | Wt 102.4 lb

## 2017-04-10 DIAGNOSIS — I480 Paroxysmal atrial fibrillation: Secondary | ICD-10-CM

## 2017-04-10 DIAGNOSIS — R0600 Dyspnea, unspecified: Secondary | ICD-10-CM

## 2017-04-10 DIAGNOSIS — R002 Palpitations: Secondary | ICD-10-CM

## 2017-04-10 DIAGNOSIS — J479 Bronchiectasis, uncomplicated: Secondary | ICD-10-CM

## 2017-04-10 DIAGNOSIS — J449 Chronic obstructive pulmonary disease, unspecified: Secondary | ICD-10-CM

## 2017-04-10 MED ORDER — CLONAZEPAM 0.5 MG PO TABS: ORAL_TABLET | ORAL | 5 refills | Status: DC

## 2017-04-10 NOTE — Patient Instructions (Signed)
Today we updated your med list in our EPIC system...    Continue your current medications the same...  We decided to try to decrease the Klonopin 0.5mg  tabs--    Currently taking 1/2 tab twice daily...    Try to decrease to 1/2 tab once daily in the PM and see how you are doing on this dose...       (goal is to minimize/ eliminate your shortness of breath episodes).  Call for any questions or if I can be of service in any way...  Let's plan a follow up visit in 62mo, sooner if needed for breathing problems.Marland KitchenMarland Kitchen

## 2017-04-16 ENCOUNTER — Encounter: Payer: Self-pay | Admitting: Pulmonary Disease

## 2017-04-16 NOTE — Progress Notes (Signed)
Subjective:     Patient ID: [name], female   DOB: 03/29/1931, 83 y.o.   MRN: [id number]  HPI    83 y/o WF referred for dyspnea> she has bronchiectasis/ scarring, mod COPD w/ a revers component, NEG Quant Gold assay, and sput w/ Pseudomonas x1 & NEG AFB/ Fungi;  She is followed by [facility] for Cards w/ PAFib...   ~  December 28, 2014:  Initial pulmonary consult by SN>   89 y/o WF, referred by Dr. Mertha Finders, for evaluation of dyspnea>>       [name] tells me that she was diagnosed w/ bronchiectasis ~15 yrs ago- notes that she had whooping cough as a child (but no known sequelae in childhood) and pneumonia x3 as an adult (in the 1990s she says);  She is not sure of her early course w/ her lung dis & records in Epic show old films from [place] office back to 2003 w/ similar features to current CXRs=> biapical pleuroparenchymal scarring with a +/- stable pattern;  CT Angiogram done 02/2006 showed neg for PE, biapical pleuroparenchymal scarring w/ fibronodular changes in RUL, RML, Lingula felt to represent likely post infectious scarring and NAD;  CXRs done 11/10/14 and 02/20/14 showed sl hyperinflation, flattening of diaphragms, stable biapical & upper lobe scarring, normal heart size, NAD...       Symptomatically she notes SOB/DOE- breathing harder when walking fast or exerting, going on for about 59yr & not really progressive (fairly stable dyspnea), ADLs are OK, prev walked on treadmill for 32min about 33yr ago but now not going to the gym;  She notes some chest tightness, a choking sensation in her throat, hard to get the air "IN" and a yawn seems to help temporarily;  She notes mild cough, sm amt green=>beige sput, w/o hemoptysis/ f/c/s... She is an ex-smoker having started in her teens, smoked off & on, up to 1/2 ppd, and quit in the 1980s- calc about a 15 pack-year smoking hx...       She is followed for CARDS by DrVaranasi> Hx CP & palpit w/ neg stress test and neg cath 2010; ?lifeline screen  showed Afib but never confirmed (also showed mild carotid dis); notes SOB, choking, palpit, & Holter in 2014 showed PACs & given Lorazepam by PCP w/ some relief; DrV checks her yearly- on ASA81 and Metop25Bid...       Current meds> Metop25Bid, Lorazepam0.5 prn, VitD supplement... EXAM reveals Afeb, VSS, O2sat=97% on RA;  HEENT- neg, mallampati1;  Chest- decr BS bilat, clear w/o w/r/r;  Heart- RR, gr1/6 SEM w/o r/g;  Abd- soft, neg;  Ext- neg w/o c/c/e...  Old CXRs in PACS> 2003/2007 showed biapical pleuroparenchymal scarring with a +/- stable pattern.   CT Angiogram done 02/2006 showed neg for PE, biapical pleuroparenchymal scarring w/ fibronodular changes in RUL, RML, lingula felt to represent likely post infectious scarring and NAD.   CXRs done 11/10/14 and 02/20/14 showed sl hyperinflation, flattening of diaphragms, stable biapical & upper lobe scarring, normal heart size, NAD (osteopenia & mid Tspine compression)...   PFT 11/20/14 showed FVC=1.80 (78%), FEV1=0.90 (53%), %1sec=50, mid-flows reduced at 31% predicted; post bronchodil there was a 19% improvement in FEV1; LungVols- TLC=4.66 (98%), RV=2.73 (117%), RV/TLC=59%; DLCO is reduced at 43% predicted...   LABS 12/28/14:  We checked BMet- wnl;  CBC- wnl w/ 2% eos;  Sed=16;  Quantiferon GOLD= NEG...  Hi-res CT Chest => sched for 01/05/15 & pending     IMP/PLAN>>  3 y/o woman  w/ mild smoking hx & quit 30 yrs ago; she has mod obstructive lung dis on PFT w/ reversible component AND decr DLCO; Labs appear neg & QuantGold is neg as well- still could have underlying bronchiectasis & poss atypic mycobactium, awaiting Hi-res CT Chest, consider getting sput for cult/ AFB; In any event her parenchymal dis does not appear to be very progressive;  REC- trial SYMBICORT160- 2sp Bid and we will recheck pt in 4-6 weeks...   ADDENDUM>>  CT Chest 01/05/15 showed norm heart size, atherosclerosis, sl dilated PA trunk at 3.5cm suggestion PAH, no adenopathy;  Lungs show  bilat patchy bronchiectasis & interstitial thickening w/ peribronchvasc nodularity (sl worse than 2007) and no ground glass, reticulation, honeycombing => we will check sput specimen for cult & AFB.Marland Kitchen. (2DEcho 06/2015 w/ PAsys=19) ADDENDUM>>  Sputum C&S 01/15/15 was pos for Pseudomonas (resist to Rocephin, sens to Cipro etc)- likely colonizer;  AFB smears are neg & culture pending...  ~  February 08, 2015:  6wk ROV w/ SN>  [name] reports that her breathing is improved, less SOB, notes that she walked a lot over the weekend at the [place] did well;  She denies much cough, sput, no hemoptysis, no CP, no f/c/s... She remains on Advair115-2spBid and Lorazepam 0.5mg  prn...     Mod obstructive lung dis w/ a revers component & GOLD Stage3 COPD on PFTs> on ADVAIR115-2spBid & she is improved w/ incr exercise tolerance    Bronchiectasis & pleuroparenchymal scarring on CT, r/o MAI infection/ Lady Winder[place] syndrome> AFB cult is neg x1    Pseudomonas colonization> the pseudomonas is sens to Cipro... EXAM reveals Afeb, VSS, O2sat=97% on RA;  HEENT- neg, mallampati1;  Chest- decr BS bilat, clear w/o w/r/r;  Heart- RR, gr1/6 SEM w/o r/g;  Abd- soft, neg;  Ext- neg w/o c/c/e... IMP/PLAN>>  [name] is stable=> improved, asked to continue the Advair115-2spBid; she has already had the 2016 Flu vaccine; we plan ROV in 3-4 moths, sooner if needed...  ~  June 10, 2015:  73mo ROV w/ SN>  [name] remains stable- breathing is good, no prob w/ winter weather so far, she remains active & denies cough, sput, hemoptysis; she has chr stable DOE & trying to exercise; she wonders if the Advair250-DPI inhaler would be better for her & we gave her a sample 7 new Rx so she can decide...      Mod obstructive lung dis w/ a revers component & GOLD Stage3 COPD on PFTs> on ADVAIR115-2spBid & she is improved w/ incr exercise tolerance    Bronchiectasis & pleuroparenchymal scarring on CT, r/o MAI infection/ Lady Winder[place] syndrome> AFB cult is  neg x1    Pseudomonas colonization> the pseudomonas is sens to Cipro... EXAM reveals Afeb, VSS, O2sat=96% on RA;  HEENT- neg, mallampati1;  Chest- decr BS bilat, clear w/o w/r/r;  Heart- RR, gr1/6 SEM w/o r/g;  Abd- soft, neg;  Ext- neg w/o c/c/e... IMP/PLAN>>  [name] remains stable-- she will decide if she prefers the MDI vs the DPI inhaletr; asked to incr exercise program, call for any problems and we plan routine f/u 46mo...  ~  November 04, 2015:  70mo ROV w/ SN>  [name] has had a lot of difficulty in the interval- AFib w/ rvr Feb2017 and Adm 1d for IV Cardizem drip, converted to NSR & disch on Metop50Bid & Eliquis2.5Bid;  Then she developed recurrent AFib on 08/31/15 & failed DCCV=> placed on Flecainide & converted to NSR;  She has followed  up w/ DrVaranasi 10/15/15 on Flecainide50Bid, CardizemCD120, Eliquis2.5Bid and Lasix40 prn; Labs were OK, BNP=54 and told to continue same...     She presents today w/ a ?2wk hx of SOB, ?choking sensation, heavy feeling in upper chest/throat area- hard for her to describe; it is intermittent- ?when noted, ?ppt, nothing relieves it;  She stopped her Advair several weeks ago on her own therefore restarted recently;  She denies CP or palp sensation, denies dizzy/ lightheaded/ syncope;  She denies cough/ sput/ hemoptysis/ etc;  When asked to describe her SOB further- she cannot, asked if it's hard to get the air "in" or "out" she says both;  Daughter adds that she's been under a lot of stress and quite anxious- ?had a reaction to Lorazepam months ago & DrGates switched her to Zoloft;  Exam today showed sl irreg HR and EKGs showed NSR but sinus pauses & then ?escape beats=> she did not sense these episodes on the EKG machine (not c/o dizzy/ lightheaded/ palpit/ etc)...    Mod obstructive lung dis w/ a revers component & GOLD Stage3 COPD on PFTs> on ADVAIR115-2spBid & she is improved w/ incr exercise tolerance    Bronchiectasis & pleuroparenchymal scarring on CT, r/o MAI infection/ Lady  Windermere syndrome> QuantGOLD is neg, AFB cult is neg x1    Pseudomonas colonization> the pseudomonas was sens to Cipro...    PAF>  eval & Rx per DrVaranasi on Flecainide50Bid, CardizemCD120, Eliquis2.5Bid and Lasix40 prn; EKG today shows NSR but assoc w/ few pauses (~1.5sec, HR down to ~40) EXAM reveals Afeb, VSS, O2sat=96% on RA;  HEENT- neg, mallampati1;  Chest- decr BS bilat, clear w/o w/r/r;  Heart- sl irregularity, gr1/6 SEM w/o r/g;  Abd- soft, neg;  Ext- neg w/o c/c/e...  EKG 07/02/15 w/ AFib & rvr, rate=130, poor R progression V1-3...  2DEcho 07/02/15>  Norm LV size & function w/ EF=55-60%, no regional wall motion abn, norm AoV, essentially norm MV w/ trivMR, norm LA/RA, norm RV w/ RVsys=19...  EKG after chemical cardioversion> NSR, rate=80, RAE, poor R prograssion V1-3  CXR 08/24/15 showed norm heart size, hyperexpansion w/ scarring both upper lobes & nodular opac in periph LUL, degen changes in Tspine...  EKG today 11/04/15>  NSR w/ sinus pauses up to 1.5sec documented (HR~40 in that interval)RAE, poor R progression V1-3... IMP/PLAN>>  I am not sure what is going on here; she is certainly quite anxious but puts up a good front for daughter; she does NOT describe the dyspnea as the classic "can't get a DB, can't get the air IN", but has a discomfort ?choking sensation in throat/ upper chest (?globus)- btw she denies any heartburn symptoms;  I'm concerned the symptoms could be a manifestation of an arrhythmia & I'm inclined to suggest a HOLTER monitor for further eval but I'd like to discuss w/ DrVaranasi first; she is NOT on a Benzo- just Zoloft now- & I am inclined to start KLONOPIN 0.5mg  tabs- 1/2 to 1 tab PO Bid...we plan ROV recheck in 54mo...  ~  December 02, 2015:  87mo ROV & [name] reports a prompt and complete response to the [facility] 0.5mg - 1/2Bid; this has eliminated her choking sensation, chest discomfort, etc and she is now resting well at night, awakes refreshed & feeling better; she  continues to deny palpit, dizziness, lightheadedness, near syncope, etc...  EXAM shows Afeb, VSS, O2sat=96% on RA;  HEENT- neg, mallampati1;  Chest- decr BS bilat, clear w/o w/r/r;  Heart- sl irregularity, gr1/6 SEM w/o r/g;  Abd- soft, neg;  Ext- neg w/o c/c/e...  EKG today 12/02/15> ?SBrady, rate 43, some variability in p wave, poor R prog V1-2... Longest pause=1.7sec on this EKG. IMP/PLAN>>  [name] is feeling much better on the Klonopin & we will continue same;  I am still concerned about the arryhthmia, long r-r interval, slow HR, p-wave morphology differs => decided to proceed w/ HOLTER MONITOR to be sure she is not having any more serious pauses/ arrhythmias...   ADDENDUM>>  48H Holter 12/13/15 showed NSR, intermittent junctional rhythm., PACs, PVCs. Minimum HR 30 at 4AM, No atrial fibrillation.=> referred to EP... Pt [facility] by [facility] 01/13/16>  Hx PAF on Eliquis, unsuccessful cardioversion 08/2015, treated w/ Flecainide & Diltiazem, Flec dose increased & she converted to NSR=> junctional rhythm & repeat Holter 02/28/16 showed NSR, PVCs, PACs, occas juntional rhythm & 4.8 sec pause while sleeping; palpit were due to bigeminy... They decided to STOP the Flecainide & diltiazem & start AMIODARONE...  ~  March 07, 2016:  77mo ROV w/ SN>  [name] continues to do well on the Klonopin- breathing well now & just using 1/2 of the Klonopin prn (taking 3/7 days on ave)... She has been eval & followed by [facility] for EP- see above, now on AMIO;  She has a f/u appt w/ PCP- DrGates soon for a CPX & will get her FLU vaccine there... We reviewed the following medical problems during today's office visit >>     Dyspnea- multifactorial>  This improved on Klonopin trial taking 0.5mg - 1/2 to 1 tab bid...    Mod obstructive lung dis w/ a revers component & GOLD Stage3 COPD on PFTs> on ADVAIR115-2spBid & she is improved w/ incr exercise tolerance    Bronchiectasis & pleuroparenchymal scarring on CT, r/o MAI infection/ Lady  Windermere syndrome> QuantGOLD is neg, AFB cult is neg x1    Pseudomonas colonization> the pseudomonas was sens to Cipro...    Cardiac issues>  PAF & other arrhythmias as noted-  eval & Rx per DrVaranasi => DrCamnitz;  She has had mult EKGs and Holter checks, now on Amio & on-going eval & follow up.    Medical issues>  Hearing loss, tinnitus, GERD, DJD, depression & Insomnia; her PCP is DrGates... EXAM reveals Afeb, VSS, O2sat=96% on RA;  HEENT- neg, mallampati1;  Chest- decr BS bilat, clear w/o w/r/r;  Heart- sl irregularity, gr1/6 SEM w/o r/g;  Abd- soft, neg;  Ext- neg w/o c/c/e...  LABS 8-02/2016:  [place]ms- wnl;  CBC- wnl IMP/PLAN>>  Her breathing is improved, cardiac eval & Rx is on-going, continue current meds including the Advair115-2spBid & Klonopin 0.5mg  1/2-1 Bid prn...   ~  June 07, 2016:  82mo ROV w/ SN>  [name] continues to do well- denies SOB, her breathing is good, no new complaints or concerns; she notes min cough & sput, no hemoptyis or discolored phlegm, no CP, she is doing her housework/ walking/ stairs/ etc... She remains on Advair115- 2spBid & Klonopin 0.5mg  1/2-1 Bid... 05/02/16 -- she saw DrVaranasi for CARDS f/u PAF on [place]; she had DCCV 08/2015 which was unsuccessful; she had Myoview & Holter- his note is reviewed...  We reviewed the following medical problems during today's office visit >>     Dyspnea- multifactorial>  This improved on Klonopin taking 0.5mg - 1/2 to 1 tab bid, she has increased her activities & doing satis...    Mod obstructive lung dis w/ a revers component & GOLD Stage3 COPD on PFTs> on [id number] &  she is improved w/ better exercise tolerance    Bronchiectasis & pleuroparenchymal scarring on CT, r/o MAI infection/ Lady Winder[place] syndrome> QuantGOLD is neg, AFB cult is neg x1    Pseudomonas colonization> the pseudomonas was sens to Cipro...    Cardiac issues>  PAF & other arrhythmias as noted-  eval & Rx per DrVaranasi => DrCamnitz;   She has had mult EKGs and Holter checks, now on Amio200/ Eliquis2.5Bid/ Lasix40    Medical issues>  Hearing loss, tinnitus, GERD, DJD, VitD de47fic (on 2000u daily)depression (on Zoloft50)& Insomnia; her PCP is DrGates... EXAM reveals Afeb, VSS, O2sat=96% on RA;  HEENT- neg, mallampati1;  Chest- decr BS bilat, clear w/o w/r/r;  Heart- sl irregularity, gr1/6 SEM w/o r/g;  Abd- soft, neg;  Ext- neg w/o c/c/e... IMP/PLAN>> [name] remains stable on the current regimen- continue same & call for any problems...  ~  Oct 05, 2016:  77mo ROV & Nitasha reports a good interval- "I can't complain" noting that her breathing is OK, denies SOB, exercing by walking & doing well she says; similarly denies cough, sputum, hemoptysis, CP, etc;  She is regular w/ her Advair115- 2spBid + Klonopin 0.5mg - 1/2 Bid...     She saw EP-CARDS- DrCamnitz 09/27/16>  Follow up AFib on Amio200, Eliquis2.5Bid, off diltiazem due to bradycardia; his note is reviewed; feeling well & asymptomatic x occas palpit=> PVCs; EKG showed SBrady, rate55, LAE & LAD;  No AFib on holter 10/17; rec to continue same 7 f/u 29mo...  We reviewed the following medical problems during today's office visit >>     Dyspnea- multifactorial>  This improved on Klonopin taking 0.5mg - 1/2 to 1 tab bid, she has increased her activities & doing satis...    Mod obstructive lung dis w/ a revers component & GOLD Stage3 COPD on PFTs> on ADVAIR115-2spBid & she is improved w/ better exercise tolerance    Bronchiectasis & pleuroparenchymal scarring on CT, r/o MAI infection/ Lady Winder[place] syndrome> QuantGOLD is neg, AFB cult is neg in 8/16...    Pseudomonas colonization> the pseudomonas grew on C&S from 8/16 & was sens to Cipro...    Cardiac issues>  PAF & other arrhythmias as noted (PVCs)-  eval & Rx per DrVaranasi => DrCamnitz;  She has had mult EKGs and Holter checks, now on Amio200/ Eliquis2.5Bid/ Lasix40prn    Medical issues>  Hearing loss, tinnitus, GERD, DJD, VitD defic (on 2000u  daily)depression (on Zoloft50)& Insomnia; her PCP is DrGates... EXAM reveals Afeb, VSS, O2sat=94% on RA;  HEENT- neg, mallampati1;  Chest- decr BS bilat, clear w/o w/r/r;  Heart- sl irregularity, gr1/6 SEM w/o r/g;  Abd- soft, neg;  Ext- neg w/o c/c/e...  LABS in Epic from 2/18> LFTs are wnl;  CBC- ok w/ Hg=12.2;  TSH=1.17 IMP/PLAN>>  [name]'s dyspnea is stable on her current regimen & she is rec to continue same + incr exercise program as discussed;  She will maintain f/u w/ PCP-DrGates, and Cards-DrCamnitz; we plan ROV recheck in 63mo w/ f/u CXR etc...    ~  April 10, 2017:  41mo ROV & [name] again indicates a good interval feeling well & no new complaints or concerns> she reports breathing well w/o cough, sput, hemoptysis or change in SOB/DOE (eg- walking uphill, in a hurry, going uphills);  She requests written Rx for a rolling walker w/ brakes and a seat to use when exerting;  She remains on Advair115- 2spBid & Klonopin 0.5mg - 1/2 tab Bid;  She reports having a recent f/u appt  w/ DrGates & he referred her for a "pharmacy visit" due to some memory changes-- they rec stopping the AM Klonopin & continuing the eve dose; she asked my opinion of this change 7 I reminded her that this med is written PRN to help w/ her dyspnea so I certainly agree w/ trying to back off the frequency or the dose of the med to see how she does... We reviewed the recent medical specialty visits in Epic>>     She saw CARDS-EP, DrCamnitz, 03/28/17>  F/u PAF, holding NSR on Amio200 (mostly for PVCs on subseq holter) and Eliquis2.5Bid; she remains asymptomatic, no changes made... We reviewed the following medical problems during today's office visit >>     Dyspnea- multifactorial>  This improved on Klonopin taking 0.5mg - 1/2 to 1 tab bid, she has increased her activities & doing satis; Pharm has rec decr Klonopin to 1/2 tab daily in eve- ok...Marland KitchenMarland KitchenMarland Kitchen    Mod obstructive lung dis w/ a revers component & GOLD Stage3 COPD on PFTs> on  ADVAIR115-2spBid & she is improved w/ better exercise tolerance    Bronchiectasis & pleuroparenchymal scarring on CT, r/o MAI infection/ Lady Winder[place] syndrome> QuantGOLD is neg, AFB cult is neg in 8/16; she denies cough, sputum, hemoptysis, etc...    Pseudomonas colonization> the pseudomonas grew on C&S from 8/16 & was sens to Cipro...    Cardiac issues>  PAF & other arrhythmias as noted (PVCs)-  eval & Rx per DrVaranasi => DrCamnitz;  She has had mult EKGs and Holter checks, now on Amio200/ Eliquis2.5Bid/ Lasix40prn    Medical issues>  Hearing loss, tinnitus, GERD, DJD, VitD defic (on 2000u daily)depression (on Zoloft50) & Insomnia; her PCP is DrGates... EXAM reveals Afeb, VSS, O2sat=96% on RA;  HEENT- neg, mallampati1;  Chest- decr BS bilat, clear w/o w/r/r;  Heart- sl irregularity, gr1/6 SEM w/o r/g;  Abd- soft, neg;  Ext- neg w/o c/c/e... IMP/PLAN>>  [name] is now 83 y/o & slowing down some;  She requests Rx for a rolling walker w/ seat- ok;  She is stable from the pulm standpoint & will continue the Advair115 & the lower dose of Klonopin; reminded to call for any breathing issues we plan recheck in 75mo...      NOTE:  >50% of this 33min ROV was spent in counseling & coordination of care...     Past Medical History:  Diagnosis Date  . Bronchiectasis ([place])    with multiple pneumonia back in 1990's  . Bronchitis    episodic  . DJD (degenerative joint disease) of knee   . GERD (gastroesophageal reflux disease)    episodic symptoms  . Hearing loss    chronic moderate hearing loss, worked up by ENT and hearing aids have been recommended  . History of nuclear stress test 11/07   NORMAL  . Insomnia   . PVC's (premature ventricular contractions)    on EKG, controlled on atenolol and caffeine reduction  . Tinnitus    chronic    Past Surgical History:  Procedure Laterality Date  . BREAST EXCISIONAL BIOPSY Left   . CANCELLED PROCEDURE  01/25/2016   Performed by Constance Haw, MD at  [place]  . CARDIOVERSION N/A 08/31/2015   Performed by Jerline Pain, MD at [facility] ENDOSCOPY  . cyst removed     left breast    Outpatient Encounter Medications as of 04/10/2017  Medication Sig  . ADVAIR HFA 115-21 MCG/ACT inhaler INHALE 2 PUFFS INTO THE LUNGS TWICE DAILY (Patient  taking differently: INHALE 1-2 PUFFS INTO THE LUNGS DAILY)  . amiodarone (PACERONE) 200 MG tablet Take 1 tablet (200 mg total) by mouth daily.  Marland Kitchen apixaban (ELIQUIS) 2.5 MG TABS tablet Take 1 tablet (2.5 mg total) by mouth 2 (two) times daily.  . clonazePAM (KLONOPIN) 0.5 MG tablet TAKE 1/2 tablet in the PM BY MOUTH daily  . furosemide (LASIX) 40 MG tablet Take 40 mg by mouth daily as needed.  . sertraline (ZOLOFT) 50 MG tablet Take 50 mg by mouth daily.  . [DISCONTINUED] clonazePAM (KLONOPIN) 0.5 MG tablet TAKE 1/2-1 TABLET BY MOUTH TWICE DAILY (Patient taking differently: TAKE 1/2 TABLET BY MOUTH TWICE DAILY)   No facility-administered encounter medications on file as of 04/10/2017.     Allergies  Allergen Reactions  . Augmentin [Amoxicillin-Pot Clavulanate] Nausea And Vomiting  . Biaxin [Clarithromycin] Nausea And Vomiting  . Metoprolol Other (See Comments)    Patient notes she "feels funny" after taking it  . Tequin [Gatifloxacin] Nausea Only    Immunization History  Administered Date(s) Administered  . Influenza, High Dose Seasonal PF 02/27/2016  . Influenza,inj,Quad PF,6+ Mos 03/28/2017  . Influenza-Unspecified 03/01/2015  . Pneumococcal Conjugate-13 10/15/2013  . Pneumococcal-Unspecified 02/27/2016    Current Medications, Allergies, Past Medical History, Past Surgical History, Family History, and Social History were reviewed in [employer] record.   Review of Systems            All symptoms NEG except where BOLDED >>  Constitutional:  F/C/S, fatigue, anorexia, unexpected weight change. HEENT:  HA, visual changes, hearing loss, earache, nasal symptoms, sore throat,  mouth sores, hoarseness. Resp:  cough, sputum, hemoptysis; SOB, tightness, wheezing. Cardio:  CP, palpit, DOE, orthopnea, edema. GI:  N/V/D/C, blood in stool; reflux, abd pain, distention, gas. GU:  dysuria, freq, urgency, hematuria, flank pain, voiding difficulty. MS:  joint pain, swelling, tenderness, decr ROM; neck pain, back pain, etc. Neuro:  HA, tremors, seizures, dizziness, syncope, weakness, numbness, gait abn. Skin:  suspicious lesions or skin rash. Heme:  adenopathy, bruising, bleeding. Psyche:  confusion, agitation, sleep disturbance, hallucinations, anxiety, depression suicidal.   Objective:   Physical Exam      Vital Signs:  Reviewed...   General:  WD, WN, 83 y/o WM in NAD; alert & oriented; pleasant & cooperative... HEENT:  [place]/AT; Conjunctiva- pink, Sclera- nonicteric, EOM-wnl, PERRLA, EACs-clear, TMs-wnl; NOSE-clear; THROAT-clear & wnl.  Neck:  Supple w/ fairl ROM; no JVD; normal carotid impulses w/o bruits; no thyromegaly or nodules palpated; no lymphadenopathy.  Chest:  Sl decr BS at bases, clear to P & A without wheezes, rales, or signs of consolidation... Heart:  Regular Rhythm; norm S1 & S2 without murmurs, rubs, or gallops detected. Abdomen:  Soft & nontender- no guarding or rebound; normal bowel sounds; no organomegaly or masses palpated. Ext:  decrROM; without deformities +arthritic changes; no varicose veins, venous insuffic, or edema;  Pulses intact w/o bruits. Neuro:  No focal neuro deficits; sensory testing normal; gait normal & balance OK. Derm:  No lesions noted; no rash etc. Lymph:  No cervical, supraclavicular, axillary, or inguinal adenopathy palpated.   Assessment:      IMP >>     Mod obstructive lung dis w/ a revers component & GOLD Stage3 COPD on PFTs> on ADVAIR115-2spBid & she was improved w/ incr exercise tolerance    Bronchiectasis & pleuroparenchymal scarring on CT, r/o MAI infection/ Lady [place]inder[place] syndrome> QuantGOLD is neg, AFB cult is neg  x1    Pseudomonas colonization>  the pseudomonas was sens to Cipro...    PAF>  eval & Rx per DrVaranasi on Flecainide50Bid, CardizemCD120, Eliquis2.5Bid and Lasix40 prn; EKG today shows NSR but assoc w/ few pauses (~1.7sec, HR down to ~40s)  PLAN >> 12/28/14>   8 y/o woman w/ mild smoking hx & quit 30 yrs ago; she has mod obstructive lung dis on PFT w/ reversible component AND decr DLCO; Labs appear neg & QuantGold is neg as well- still could have underlying bronchiectasis & poss atypic mycobactium, awaiting Hi-res CT Chest, consider getting sput for cult/ AFB; In any event her parenchymal dis does not appear to be very progressive;  REC- trial SYMBICORT160- 2sp Bid and we will recheck pt in 4-6 weeks. 02/08/15>   Rocio is stable=> improved, asked to continue the [place]; she has already had the 2016 Flu vaccine; we plan ROV in 3-4 moths, sooner if needed... 06/11/15>  Kama remains stable-- she will decide if she prefers the MDI vs the DPI inhaletr; asked to incr exercise program, call for any problems and we plan routine f/u 94mo. 11/04/15>   She has had a lot of trouble w/ AFib; ?what is causing her dyspnea sensation (?cardiac arrhythmia, anxiety, chest wall factors); we will consider checking a holter monitor & start rx w/ Klonopin 0.5mg - 1/2to1tab Bid... 12/02/15>   Much improved on the Klonopin; still w/ slow HR, sl irreg w/ pauses, & we will proceed w/ HOLTER MONITOR... 06/07/16>   Murel remains stable on the current regimen- continue same & call for any problems... 10/05/16>   Veronnica's dyspnea is stable on her current regimen & she is rec to continue same + incr exercise program as discussed;  She will maintain f/u w/ PCP-DrGates, and Cards-DrCamnitz; we plan ROV recheck in 72mo w/ f/u CXR etc. 04/10/17>   Deshay is now 83 y/o & slowing down some;  She requests Rx for a rolling walker w/ seat- ok;  She is stable from the pulm standpoint & will continue the Advair115 & the lower dose of Klonopin; reminded  to call for any breathing issues we plan recheck in 43mo.   Plan:       Medication List        Accurate as of 04/10/17 11:59 PM. Always use your most recent med list.          ADVAIR HFA 115-21 MCG/ACT inhaler Generic drug:  fluticasone-salmeterol INHALE 2 PUFFS INTO THE LUNGS TWICE DAILY   amiodarone 200 MG tablet Commonly known as:  PACERONE Take 1 tablet (200 mg total) by mouth daily.   apixaban 2.5 MG Tabs tablet Commonly known as:  ELIQUIS Take 1 tablet (2.5 mg total) by mouth 2 (two) times daily.   clonazePAM 0.5 MG tablet Commonly known as:  KLONOPIN TAKE 1/2 tablet in the PM BY MOUTH daily   furosemide 40 MG tablet Commonly known as:  LASIX   sertraline 50 MG tablet Commonly known as:  ZOLOFT       Where to Get Your Medications    These medications were sent to [facility] [place] [place] [facility] [address]  [place], [place] [address]   Phone:  [phone]   clonazePAM 0.5 MG tablet

## 2017-04-18 ENCOUNTER — Other Ambulatory Visit: Payer: Self-pay | Admitting: Cardiology

## 2017-04-18 ENCOUNTER — Other Ambulatory Visit: Payer: Self-pay | Admitting: Interventional Cardiology

## 2017-06-07 DIAGNOSIS — H61002 Unspecified perichondritis of left external ear: Secondary | ICD-10-CM | POA: Diagnosis not present

## 2017-06-07 DIAGNOSIS — L570 Actinic keratosis: Secondary | ICD-10-CM | POA: Diagnosis not present

## 2017-06-18 ENCOUNTER — Other Ambulatory Visit: Payer: Self-pay | Admitting: Pulmonary Disease

## 2017-06-18 ENCOUNTER — Other Ambulatory Visit: Payer: Self-pay | Admitting: *Deleted

## 2017-06-18 MED ORDER — CLONAZEPAM 0.5 MG PO TABS: ORAL_TABLET | ORAL | 3 refills | Status: DC

## 2017-08-08 DIAGNOSIS — H61009 Unspecified perichondritis of external ear, unspecified ear: Secondary | ICD-10-CM | POA: Diagnosis not present

## 2017-08-08 DIAGNOSIS — L570 Actinic keratosis: Secondary | ICD-10-CM | POA: Diagnosis not present

## 2017-09-07 ENCOUNTER — Ambulatory Visit (INDEPENDENT_AMBULATORY_CARE_PROVIDER_SITE_OTHER): Payer: Medicare Other | Admitting: Cardiology

## 2017-09-07 ENCOUNTER — Encounter: Payer: Self-pay | Admitting: Cardiology

## 2017-09-07 VITALS — BP 160/70 | HR 57 | Ht 60.0 in | Wt 101.0 lb

## 2017-09-07 DIAGNOSIS — I480 Paroxysmal atrial fibrillation: Secondary | ICD-10-CM | POA: Diagnosis not present

## 2017-09-07 DIAGNOSIS — Z79899 Other long term (current) drug therapy: Secondary | ICD-10-CM | POA: Diagnosis not present

## 2017-09-07 DIAGNOSIS — I493 Ventricular premature depolarization: Secondary | ICD-10-CM

## 2017-09-07 NOTE — Patient Instructions (Addendum)
Medication Instructions:  Your physician recommends that you continue on your current medications as directed. Please refer to the Current Medication list given to you today.  Labwork: Today: TSH, LFTs, BMET & CBC  Testing/Procedures: None ordered  Follow-Up: Your physician wants you to follow-up in: 6 months with Dr. Curt Bears.  You will receive a reminder letter in the mail two months in advance. If you don't receive a letter, please call our office to schedule the follow-up appointment.  * If you need a refill on your cardiac medications before your next appointment, please call your pharmacy.   *Please note that any paperwork needing to be filled out by the provider will need to be addressed at the front desk prior to seeing the provider. Please note that any FMLA, disability or other documents regarding health condition is subject to a $25.00 charge that must be received prior to completion of paperwork in the form of a money order or check.  Thank you for choosing [facility]!!   Trinidad Curet, RN [phone]

## 2017-09-07 NOTE — Progress Notes (Signed)
Electrophysiology Office Note   Date:  09/07/2017   ID:  [name], DOB 18-Jun-1930, MRN [id number]  PCP:  Josetta Huddle, MD  Cardiologist:  Christ Kick Primary Electrophysiologist:  Kavi Almquist Meredith Leeds, MD    No chief complaint on file.    History of Present Illness: [name] is a 84 y.o. female who presents today for electrophysiology evaluation.   History of paroxysmal atrial fibrillation and elevated CHA2DS2 VASc score of 3, on chronic anticoagulation with Eliquis. Had cardioversion 08/31/15 which was unsuccessful.  Flecainide was increased to 100 mg BID and she had attempted repeat cardioversion but was in sinus rhythm when she showed up to the hospital. Wore a holter monitor showing 18% PVCs but no atrial fibrillation with episodes of junctional rhythm and HR in the 20s during sleeping hours.   Today, denies symptoms of palpitations, chest pain, shortness of breath, orthopnea, PND, lower extremity edema, claudication, dizziness, presyncope, syncope, bleeding, or neurologic sequela. The patient is tolerating medications without difficulties.  He is overall felt well.  She has had a few episodes of palpitations but they are much less on her amiodarone.   Past Medical History:  Diagnosis Date  . Bronchiectasis ([place])    with multiple pneumonia back in 1990's  . Bronchitis    episodic  . DJD (degenerative joint disease) of knee   . GERD (gastroesophageal reflux disease)    episodic symptoms  . Hearing loss    chronic moderate hearing loss, worked up by ENT and hearing aids have been recommended  . History of nuclear stress test 11/07   NORMAL  . Insomnia   . PVC's (premature ventricular contractions)    on EKG, controlled on atenolol and caffeine reduction  . Tinnitus    chronic   Past Surgical History:  Procedure Laterality Date  . BREAST EXCISIONAL BIOPSY Left   . CARDIOVERSION N/A 08/31/2015   Procedure: CARDIOVERSION;  Surgeon: Jerline Pain, MD;  Location: [facility]  ENDOSCOPY;  Service: Cardiovascular;  Laterality: N/A;  . cyst removed     left breast     Current Outpatient Medications  Medication Sig Dispense Refill  . ADVAIR HFA 115-21 MCG/ACT inhaler INHALE 2 PUFFS INTO THE LUNGS TWICE DAILY (Patient taking differently: INHALE 1-2 PUFFS INTO THE LUNGS DAILY) 12 g 3  . amiodarone (PACERONE) 200 MG tablet Take one (1) tablet (200 mg) by mouth daily. 90 tablet 3  . clonazePAM (KLONOPIN) 0.5 MG tablet TAKE 1/2 tablet in the PM BY MOUTH daily 15 tablet 3  . ELIQUIS 2.5 MG TABS tablet TAKE 1 TABLET(2.5 MG) BY MOUTH TWICE DAILY 60 tablet 5  . furosemide (LASIX) 40 MG tablet Take 40 mg by mouth daily as needed.    . sertraline (ZOLOFT) 50 MG tablet Take 50 mg by mouth daily.     No current facility-administered medications for this visit.     Allergies:   Augmentin [amoxicillin-pot clavulanate]; Biaxin [clarithromycin]; Metoprolol; and Tequin [gatifloxacin]   Social History:  The patient  reports that she quit smoking about 49 years ago. Her smoking use included cigarettes. She has a 10.00 pack-year smoking history. She has never used smokeless tobacco. She reports that she drinks alcohol. She reports that she does not use drugs.   Family History:  The patient's family history includes Asthma in her sister; Clotting disorder in her sister; Hypertension in her father and mother; Stroke in her father and mother.   ROS:  Please see the history of present illness.  Otherwise, review of systems is positive for none.   All other systems are reviewed and negative.   PHYSICAL EXAM: VS:  BP (!) 160/70   Pulse (!) 57   Ht 5' (1.524 m)   Wt 101 lb (45.8 kg)   SpO2 95%   BMI 19.73 kg/m  , BMI Body mass index is 19.73 kg/m. GEN: Well nourished, well developed, in no acute distress  HEENT: normal  Neck: no JVD, carotid bruits, or masses Cardiac: RRR; no murmurs, rubs, or gallops,no edema  Respiratory:  clear to auscultation bilaterally, normal work of  breathing GI: soft, nontender, nondistended, + BS MS: no deformity or atrophy  Skin: warm and dry Neuro:  Strength and sensation are intact Psych: euthymic mood, full affect  EKG:  EKG is ordered today. Personal review of the ekg ordered shows SR, PAC, LAE, LAD   Recent Labs: 12/27/2016: ALT 20; TSH 1.550    Lipid Panel     Component Value Date/Time   CHOL 163 07/03/2015 0440   TRIG 73 07/03/2015 0440   HDL 57 07/03/2015 0440   CHOLHDL 2.9 07/03/2015 0440   VLDL 15 07/03/2015 0440   LDLCALC 91 07/03/2015 0440     Wt Readings from Last 3 Encounters:  09/07/17 101 lb (45.8 kg)  04/10/17 102 lb 6.4 oz (46.4 kg)  03/28/17 104 lb (47.2 kg)      Other studies Reviewed: Additional studies/ records that were reviewed today include: TTE 07/03/15  Review of the above records today demonstrates:  - Left ventricle: The cavity size was normal. Systolic function was   normal. The estimated ejection fraction was in the range of 55%   to 60%. Wall motion was normal; there were no regional wall   motion abnormalities.  Holter 12/13/15  NSR, intermittent junctional rhythm.  PACs, PVCs.  Minimum HR 30 at 4AM, presumably during sleep.  No atrial fibrillation.  Holter 02/28/16 Minimum HR: 17 BPM at 3:44:42 AM Maximum HR: 114 BPM at 9:07:34 AM Average HR: 51 BPM 18.2% PVCs 2.9% APCs Sinus rhytnm Zero atrial fibrillation Occasional junctional rhythm during sleeping hours 4.8 second sinus pause during sleeping hours Chest pounding associated with ventricular bigeminy Longest ventriclar run 12 beats  ASSESSMENT AND PLAN:  1.  Parosysmal atrial fibrillation: He on amiodarone and Eliquis.  She is not on rate controlling medications that she has had significant bradycardia.  In sinus rhythm today.  She is felt much improved on amiodarone.  No changes.  This patients CHA2DS2-VASc Score and unadjusted Ischemic Stroke Rate (% per year) is equal to 3.2 % stroke rate/year from a score  of 3  Above score calculated as 1 point each if present [CHF, HTN, DM, Vascular=MI/PAD/Aortic Plaque, Age if 65-74, or Female] Above score calculated as 2 points each if present [Age > 75, or Stroke/TIA/TE]   2. PVCs: 18% on monitor. Much improved on amiodarone. Aradia Estey check LFTs and TSH today.    Current medicines are reviewed at length with the patient today.   The patient has concerns regarding her medicines.  The following changes were made today: none  Labs/ tests ordered today include:  Orders Placed This Encounter  Procedures  . TSH  . Hepatic function panel  . Basic Metabolic Panel (BMET)  . CBC w/Diff  . EKG 12-Lead     Disposition:   FU with Izeyah Deike 6  months  Signed, Ronnel Zuercher Meredith Leeds, MD  09/07/2017 2:57 PM     [facility]  [address] [place] [address] [phone] (office) [phone] (fax)

## 2017-09-08 LAB — BASIC METABOLIC PANEL
BUN/Creatinine Ratio: 18 (ref 12–28)
BUN: 15 mg/dL (ref 8–27)
CALCIUM: 9.3 mg/dL (ref 8.7–10.3)
CHLORIDE: 103 mmol/L (ref 96–106)
CO2: 25 mmol/L (ref 20–29)
Creatinine, Ser: 0.83 mg/dL (ref 0.57–1.00)
GFR, EST AFRICAN AMERICAN: 75 mL/min/{1.73_m2} (ref >59)
GFR, EST NON AFRICAN AMERICAN: 65 mL/min/{1.73_m2} (ref >59)
Glucose: 92 mg/dL (ref 65–99)
Potassium: 4.3 mmol/L (ref 3.5–5.2)
Sodium: 145 mmol/L — ABNORMAL HIGH (ref 134–144)

## 2017-09-08 LAB — CBC WITH DIFFERENTIAL/PLATELET
BASOS ABS: 0 10*3/uL (ref 0.0–0.2)
BASOS: 0 %
EOS (ABSOLUTE): 0.1 10*3/uL (ref 0.0–0.4)
Eos: 2 %
HEMOGLOBIN: 13.4 g/dL (ref 11.1–15.9)
Hematocrit: 38.6 % (ref 34.0–46.6)
IMMATURE GRANS (ABS): 0 10*3/uL (ref 0.0–0.1)
Immature Granulocytes: 0 %
LYMPHS: 29 %
Lymphocytes Absolute: 1.4 10*3/uL (ref 0.7–3.1)
MCH: 31.4 pg (ref 26.6–33.0)
MCHC: 34.7 g/dL (ref 31.5–35.7)
MCV: 90 fL (ref 79–97)
MONOCYTES: 10 %
Monocytes Absolute: 0.5 10*3/uL (ref 0.1–0.9)
NEUTROS ABS: 2.9 10*3/uL (ref 1.4–7.0)
Neutrophils: 59 %
Platelets: 182 10*3/uL (ref 150–379)
RBC: 4.27 x10E6/uL (ref 3.77–5.28)
RDW: 14.7 % (ref 12.3–15.4)
WBC: 4.8 10*3/uL (ref 3.4–10.8)

## 2017-09-08 LAB — HEPATIC FUNCTION PANEL
ALBUMIN: 4.2 g/dL (ref 3.5–4.7)
ALT: 22 IU/L (ref 0–32)
AST: 28 IU/L (ref 0–40)
Alkaline Phosphatase: 85 IU/L (ref 39–117)
BILIRUBIN TOTAL: 0.5 mg/dL (ref 0.0–1.2)
Bilirubin, Direct: 0.21 mg/dL (ref 0.00–0.40)
TOTAL PROTEIN: 6.6 g/dL (ref 6.0–8.5)

## 2017-09-08 LAB — TSH: TSH: 1.33 u[IU]/mL (ref 0.450–4.500)

## 2017-10-03 DIAGNOSIS — L570 Actinic keratosis: Secondary | ICD-10-CM | POA: Diagnosis not present

## 2017-10-03 DIAGNOSIS — L249 Irritant contact dermatitis, unspecified cause: Secondary | ICD-10-CM | POA: Diagnosis not present

## 2017-10-08 ENCOUNTER — Ambulatory Visit: Payer: PPO | Admitting: Pulmonary Disease

## 2017-10-14 ENCOUNTER — Other Ambulatory Visit: Payer: Self-pay | Admitting: Interventional Cardiology

## 2017-10-15 NOTE — Telephone Encounter (Signed)
Pt is a 84 yr old female who last saw Dr Curt Bears on 09/07/17. Her weight at that visit 45.8Kg. SCr on 09/07/17 0.83. Will refill her Eliquis 2.5mg  BID.

## 2017-10-25 ENCOUNTER — Ambulatory Visit (INDEPENDENT_AMBULATORY_CARE_PROVIDER_SITE_OTHER)
Admission: RE | Admit: 2017-10-25 | Discharge: 2017-10-25 | Disposition: A | Discharge status: Home / Self Care | Payer: Medicare Other | Source: Ambulatory Visit | Attending: Pulmonary Disease | Admitting: Pulmonary Disease

## 2017-10-25 ENCOUNTER — Encounter: Payer: Self-pay | Admitting: Pulmonary Disease

## 2017-10-25 ENCOUNTER — Ambulatory Visit (INDEPENDENT_AMBULATORY_CARE_PROVIDER_SITE_OTHER): Payer: Medicare Other | Admitting: Pulmonary Disease

## 2017-10-25 VITALS — BP 128/62 | HR 51 | Temp 98.2°F | Ht 60.0 in | Wt 102.6 lb

## 2017-10-25 DIAGNOSIS — R918 Other nonspecific abnormal finding of lung field: Secondary | ICD-10-CM | POA: Diagnosis not present

## 2017-10-25 DIAGNOSIS — J449 Chronic obstructive pulmonary disease, unspecified: Secondary | ICD-10-CM

## 2017-10-25 DIAGNOSIS — R0600 Dyspnea, unspecified: Secondary | ICD-10-CM

## 2017-10-25 DIAGNOSIS — I480 Paroxysmal atrial fibrillation: Secondary | ICD-10-CM

## 2017-10-25 DIAGNOSIS — J479 Bronchiectasis, uncomplicated: Secondary | ICD-10-CM | POA: Diagnosis not present

## 2017-10-25 NOTE — Progress Notes (Signed)
Subjective:     Patient ID: [name], female   DOB: February 14, 1931, 84 y.o.   MRN: [id number]  HPI    84 y/o WF referred for dyspnea> she has bronchiectasis/ scarring, mod COPD w/ a revers component, NEG Quant Gold assay, and sput w/ Pseudomonas x1 & NEG AFB/ Fungi;  She is followed by [facility] for Cards w/ PAFib...   ~  December 28, 2014:  Initial pulmonary consult by SN>   73 y/o WF, referred by Dr. Mertha Finders, for evaluation of dyspnea>>       [name] tells me that she was diagnosed w/ bronchiectasis ~15 yrs ago- notes that she had whooping cough as a child (but no known sequelae in childhood) and pneumonia x3 as an adult (in the 1990s she says);  She is not sure of her early course w/ her lung dis & records in Epic show old films from [place] office back to 2003 w/ similar features to current CXRs=> biapical pleuroparenchymal scarring with a +/- stable pattern;  CT Angiogram done 02/2006 showed neg for PE, biapical pleuroparenchymal scarring w/ fibronodular changes in RUL, RML, Lingula felt to represent likely post infectious scarring and NAD;  CXRs done 11/10/14 and 02/20/14 showed sl hyperinflation, flattening of diaphragms, stable biapical & upper lobe scarring, normal heart size, NAD...       Symptomatically she notes SOB/DOE- breathing harder when walking fast or exerting, going on for about 65yr & not really progressive (fairly stable dyspnea), ADLs are OK, prev walked on treadmill for 31min about 13yr ago but now not going to the gym;  She notes some chest tightness, a choking sensation in her throat, hard to get the air "IN" and a yawn seems to help temporarily;  She notes mild cough, sm amt green=>beige sput, w/o hemoptysis/ f/c/s... She is an ex-smoker having started in her teens, smoked off & on, up to 1/2 ppd, and quit in the 1980s- calc about a 15 pack-year smoking hx...       She is followed for CARDS by DrVaranasi> Hx CP & palpit w/ neg stress test and neg cath 2010; ?lifeline screen  showed Afib but never confirmed (also showed mild carotid dis); notes SOB, choking, palpit, & Holter in 2014 showed PACs & given Lorazepam by PCP w/ some relief; DrV checks her yearly- on ASA81 and Metop25Bid...       Current meds> Metop25Bid, Lorazepam0.5 prn, VitD supplement... EXAM reveals Afeb, VSS, O2sat=97% on RA;  HEENT- neg, mallampati1;  Chest- decr BS bilat, clear w/o w/r/r;  Heart- RR, gr1/6 SEM w/o r/g;  Abd- soft, neg;  Ext- neg w/o c/c/e...  Old CXRs in PACS> 2003/2007 showed biapical pleuroparenchymal scarring with a +/- stable pattern.   CT Angiogram done 02/2006 showed neg for PE, biapical pleuroparenchymal scarring w/ fibronodular changes in RUL, RML, lingula felt to represent likely post infectious scarring and NAD.   CXRs done 11/10/14 and 02/20/14 showed sl hyperinflation, flattening of diaphragms, stable biapical & upper lobe scarring, normal heart size, NAD (osteopenia & mid Tspine compression)...   PFT 11/20/14 showed FVC=1.80 (78%), FEV1=0.90 (53%), %1sec=50, mid-flows reduced at 31% predicted; post bronchodil there was a 19% improvement in FEV1; LungVols- TLC=4.66 (98%), RV=2.73 (117%), RV/TLC=59%; DLCO is reduced at 43% predicted...   LABS 12/28/14:  We checked BMet- wnl;  CBC- wnl w/ 2% eos;  Sed=16;  Quantiferon GOLD= NEG...  Hi-res CT Chest => sched for 01/05/15 & pending     IMP/PLAN>>  84 y/o woman  w/ mild smoking hx & quit 30 yrs ago; she has mod obstructive lung dis on PFT w/ reversible component AND decr DLCO; Labs appear neg & QuantGold is neg as well- still could have underlying bronchiectasis & poss atypic mycobactium, awaiting Hi-res CT Chest, consider getting sput for cult/ AFB; In any event her parenchymal dis does not appear to be very progressive;  REC- trial SYMBICORT160- 2sp Bid and we will recheck pt in 4-6 weeks...   ADDENDUM>>  CT Chest 01/05/15 showed norm heart size, atherosclerosis, sl dilated PA trunk at 3.5cm suggestion PAH, no adenopathy;  Lungs show  bilat patchy bronchiectasis & interstitial thickening w/ peribronchvasc nodularity (sl worse than 2007) and no ground glass, reticulation, honeycombing => we will check sput specimen for cult & AFB.Marland Kitchen. (2DEcho 06/2015 w/ PAsys=19) ADDENDUM>>  Sputum C&S 01/15/15 was pos for Pseudomonas (resist to Rocephin, sens to Cipro etc)- likely colonizer;  AFB smears are neg & culture pending...  ~  February 08, 2015:  6wk ROV w/ SN>  [name] reports that her breathing is improved, less SOB, notes that she walked a lot over the weekend at the [place] did well;  She denies much cough, sput, no hemoptysis, no CP, no f/c/s... She remains on Advair115-2spBid and Lorazepam 0.5mg  prn...     Mod obstructive lung dis w/ a revers component & GOLD Stage3 COPD on PFTs> on ADVAIR115-2spBid & she is improved w/ incr exercise tolerance    Bronchiectasis & pleuroparenchymal scarring on CT, r/o MAI infection/ Lady Windermere syndrome> AFB cult is neg x1    Pseudomonas colonization> the pseudomonas is sens to Cipro... EXAM reveals Afeb, VSS, O2sat=97% on RA;  HEENT- neg, mallampati1;  Chest- decr BS bilat, clear w/o w/r/r;  Heart- RR, gr1/6 SEM w/o r/g;  Abd- soft, neg;  Ext- neg w/o c/c/e... IMP/PLAN>>  [name] is stable=> improved, asked to continue the Advair115-2spBid; she has already had the 2016 Flu vaccine; we plan ROV in 3-4 moths, sooner if needed...  ~  June 10, 2015:  38mo ROV w/ SN>  [name] remains stable- breathing is good, no prob w/ winter weather so far, she remains active & denies cough, sput, hemoptysis; she has chr stable DOE & trying to exercise; she wonders if the Advair250-DPI inhaler would be better for her & we gave her a sample 7 new Rx so she can decide...      Mod obstructive lung dis w/ a revers component & GOLD Stage3 COPD on PFTs> on ADVAIR115-2spBid & she is improved w/ incr exercise tolerance    Bronchiectasis & pleuroparenchymal scarring on CT, r/o MAI infection/ Lady Windermere syndrome> AFB cult is  neg x1    Pseudomonas colonization> the pseudomonas is sens to Cipro... EXAM reveals Afeb, VSS, O2sat=96% on RA;  HEENT- neg, mallampati1;  Chest- decr BS bilat, clear w/o w/r/r;  Heart- RR, gr1/6 SEM w/o r/g;  Abd- soft, neg;  Ext- neg w/o c/c/e... IMP/PLAN>>  [name] remains stable-- she will decide if she prefers the MDI vs the DPI inhaletr; asked to incr exercise program, call for any problems and we plan routine f/u 64mo...  ~  November 04, 2015:  28mo ROV w/ SN>  [name] has had a lot of difficulty in the interval- AFib w/ rvr Feb2017 and Adm 1d for IV Cardizem drip, converted to NSR & disch on Metop50Bid & Eliquis2.5Bid;  Then she developed recurrent AFib on 08/31/15 & failed DCCV=> placed on Flecainide & converted to NSR;  She has followed  up w/ DrVaranasi 10/15/15 on Flecainide50Bid, CardizemCD120, Eliquis2.5Bid and Lasix40 prn; Labs were OK, BNP=54 and told to continue same...     She presents today w/ a ?2wk hx of SOB, ?choking sensation, heavy feeling in upper chest/throat area- hard for her to describe; it is intermittent- ?when noted, ?ppt, nothing relieves it;  She stopped her Advair several weeks ago on her own therefore restarted recently;  She denies CP or palp sensation, denies dizzy/ lightheaded/ syncope;  She denies cough/ sput/ hemoptysis/ etc;  When asked to describe her SOB further- she cannot, asked if it's hard to get the air "in" or "out" she says both;  Daughter adds that she's been under a lot of stress and quite anxious- ?had a reaction to Lorazepam months ago & DrGates switched her to Zoloft;  Exam today showed sl irreg HR and EKGs showed NSR but sinus pauses & then ?escape beats=> she did not sense these episodes on the EKG machine (not c/o dizzy/ lightheaded/ palpit/ etc)...    Mod obstructive lung dis w/ a revers component & GOLD Stage3 COPD on PFTs> on ADVAIR115-2spBid & she is improved w/ incr exercise tolerance    Bronchiectasis & pleuroparenchymal scarring on CT, r/o MAI infection/ Lady  Windermere syndrome> QuantGOLD is neg, AFB cult is neg x1    Pseudomonas colonization> the pseudomonas was sens to Cipro...    PAF>  eval & Rx per DrVaranasi on Flecainide50Bid, CardizemCD120, Eliquis2.5Bid and Lasix40 prn; EKG today shows NSR but assoc w/ few pauses (~1.5sec, HR down to ~40) EXAM reveals Afeb, VSS, O2sat=96% on RA;  HEENT- neg, mallampati1;  Chest- decr BS bilat, clear w/o w/r/r;  Heart- sl irregularity, gr1/6 SEM w/o r/g;  Abd- soft, neg;  Ext- neg w/o c/c/e...  EKG 07/02/15 w/ AFib & rvr, rate=130, poor R progression V1-3...  2DEcho 07/02/15>  Norm LV size & function w/ EF=55-60%, no regional wall motion abn, norm AoV, essentially norm MV w/ trivMR, norm LA/RA, norm RV w/ RVsys=19...  EKG after chemical cardioversion> NSR, rate=80, RAE, poor R prograssion V1-3  CXR 08/24/15 showed norm heart size, hyperexpansion w/ scarring both upper lobes & nodular opac in periph LUL, degen changes in Tspine...  EKG today 11/04/15>  NSR w/ sinus pauses up to 1.5sec documented (HR~40 in that interval)RAE, poor R progression V1-3... IMP/PLAN>>  I am not sure what is going on here; she is certainly quite anxious but puts up a good front for daughter; she does NOT describe the dyspnea as the classic "can't get a DB, can't get the air IN", but has a discomfort ?choking sensation in throat/ upper chest (?globus)- btw she denies any heartburn symptoms;  I'm concerned the symptoms could be a manifestation of an arrhythmia & I'm inclined to suggest a HOLTER monitor for further eval but I'd like to discuss w/ DrVaranasi first; she is NOT on a Benzo- just Zoloft now- & I am inclined to start KLONOPIN 0.5mg  tabs- 1/2 to 1 tab PO Bid...we plan ROV recheck in 35mo...  ~  December 02, 2015:  43mo ROV & [name] reports a prompt and complete response to the [facility] 0.5mg - 1/2RFX; this has eliminated her choking sensation, chest discomfort, etc and she is now resting well at night, awakes refreshed & feeling better; she  continues to deny palpit, dizziness, lightheadedness, near syncope, etc...  EXAM shows Afeb, VSS, O2sat=96% on RA;  HEENT- neg, mallampati1;  Chest- decr BS bilat, clear w/o w/r/r;  Heart- sl irregularity, gr1/6 SEM w/o r/g;  Abd- soft, neg;  Ext- neg w/o c/c/e...  EKG today 12/02/15> ?SBrady, rate 43, some variability in p wave, poor R prog V1-2... Longest pause=1.7sec on this EKG. IMP/PLAN>>  Shanara is feeling much better on the Klonopin & we will continue same;  I am still concerned about the arryhthmia, long r-r interval, slow HR, p-wave morphology differs => decided to proceed w/ HOLTER MONITOR to be sure she is not having any more serious pauses/ arrhythmias...   ADDENDUM>>  48H Holter 12/13/15 showed NSR, intermittent junctional rhythm., PACs, PVCs. Minimum HR 30 at 4AM, No atrial fibrillation.=> referred to EP... Pt seen by [facility] 01/13/16>  Hx PAF on Eliquis, unsuccessful cardioversion 08/2015, treated w/ Flecainide & Diltiazem, Flec dose increased & she converted to NSR=> junctional rhythm & repeat Holter 02/28/16 showed NSR, PVCs, PACs, occas juntional rhythm & 4.8 sec pause while sleeping; palpit were due to bigeminy... They decided to STOP the Flecainide & diltiazem & start AMIODARONE...  ~  March 07, 2016:  50mo ROV w/ SN>  [name] continues to do well on the Klonopin- breathing well now & just using 1/2 of the Klonopin prn (taking 3/7 days on ave)... She has been eval & followed by [facility] for EP- see above, now on AMIO;  She has a f/u appt w/ PCP- DrGates soon for a CPX & will get her FLU vaccine there... We reviewed the following medical problems during today's office visit >>     Dyspnea- multifactorial>  This improved on Klonopin trial taking 0.5mg - 1/2 to 1 tab bid...    Mod obstructive lung dis w/ a revers component & GOLD Stage3 COPD on PFTs> on ADVAIR115-2spBid & she is improved w/ incr exercise tolerance    Bronchiectasis & pleuroparenchymal scarring on CT, r/o MAI infection/ Lady  Windermere syndrome> QuantGOLD is neg, AFB cult is neg x1    Pseudomonas colonization> the pseudomonas was sens to Cipro...    Cardiac issues>  PAF & other arrhythmias as noted-  eval & Rx per DrVaranasi => DrCamnitz;  She has had mult EKGs and Holter checks, now on Amio & on-going eval & follow up.    Medical issues>  Hearing loss, tinnitus, GERD, DJD, depression & Insomnia; her PCP is DrGates... EXAM reveals Afeb, VSS, O2sat=96% on RA;  HEENT- neg, mallampati1;  Chest- decr BS bilat, clear w/o w/r/r;  Heart- sl irregularity, gr1/6 SEM w/o r/g;  Abd- soft, neg;  Ext- neg w/o c/c/e...  LABS 8-02/2016:  Chems- wnl;  CBC- wnl IMP/PLAN>>  Her breathing is improved, cardiac eval & Rx is on-going, continue current meds including the Advair115-2spBid & Klonopin 0.5mg  1/2-1 Bid prn...   ~  June 07, 2016:  21mo ROV w/ SN>  [name] continues to do well- denies SOB, her breathing is good, no new complaints or concerns; she notes min cough & sput, no hemoptyis or discolored phlegm, no CP, she is doing her housework/ walking/ stairs/ etc... She remains on Advair115- 2spBid & Klonopin 0.5mg  1/2-1 Bid... 05/02/16 -- she saw DrVaranasi for CARDS f/u PAF on [place]; she had DCCV 08/2015 which was unsuccessful; she had Myoview & Holter- his note is reviewed...  We reviewed the following medical problems during today's office visit >>     Dyspnea- multifactorial>  This improved on Klonopin taking 0.5mg - 1/2 to 1 tab bid, she has increased her activities & doing satis...    Mod obstructive lung dis w/ a revers component & GOLD Stage3 COPD on PFTs> on [id number] &  she is improved w/ better exercise tolerance    Bronchiectasis & pleuroparenchymal scarring on CT, r/o MAI infection/ Lady Winder[place] syndrome> QuantGOLD is neg, AFB cult is neg x1    Pseudomonas colonization> the pseudomonas was sens to Cipro...    Cardiac issues>  PAF & other arrhythmias as noted-  eval & Rx per DrVaranasi => DrCamnitz;   She has had mult EKGs and Holter checks, now on Amio200/ Eliquis2.5Bid/ Lasix40    Medical issues>  Hearing loss, tinnitus, GERD, DJD, VitD de18fic (on 2000u daily)depression (on Zoloft50)& Insomnia; her PCP is DrGates... EXAM reveals Afeb, VSS, O2sat=96% on RA;  HEENT- neg, mallampati1;  Chest- decr BS bilat, clear w/o w/r/r;  Heart- sl irregularity, gr1/6 SEM w/o r/g;  Abd- soft, neg;  Ext- neg w/o c/c/e... IMP/PLAN>> [name] remains stable on the current regimen- continue same & call for any problems...  ~  Oct 05, 2016:  38mo ROV & [name] reports a good interval- "I can't complain" noting that her breathing is OK, denies SOB, exercing by walking & doing well she says; similarly denies cough, sputum, hemoptysis, CP, etc;  She is regular w/ her Advair115- 2spBid + Klonopin 0.5mg - 1/2 Bid...     She saw EP-CARDS- DrCamnitz 09/27/16>  Follow up AFib on Amio200, Eliquis2.5Bid, off diltiazem due to bradycardia; his note is reviewed; feeling well & asymptomatic x occas palpit=> PVCs; EKG showed SBrady, rate55, LAE & LAD;  No AFib on holter 10/17; rec to continue same 7 f/u 49mo...  We reviewed the following medical problems during today's office visit >>     Dyspnea- multifactorial>  This improved on Klonopin taking 0.5mg - 1/2 to 1 tab bid, she has increased her activities & doing satis...    Mod obstructive lung dis w/ a revers component & GOLD Stage3 COPD on PFTs> on ADVAIR115-2spBid & she is improved w/ better exercise tolerance    Bronchiectasis & pleuroparenchymal scarring on CT, r/o MAI infection/ Lady Winder[place] syndrome> QuantGOLD is neg, AFB cult is neg in 8/16...    Pseudomonas colonization> the pseudomonas grew on C&S from 8/16 & was sens to Cipro...    Cardiac issues>  PAF & other arrhythmias as noted (PVCs)-  eval & Rx per DrVaranasi => DrCamnitz;  She has had mult EKGs and Holter checks, now on Amio200/ Eliquis2.5Bid/ Lasix40prn    Medical issues>  Hearing loss, tinnitus, GERD, DJD, VitD defic (on 2000u  daily)depression (on Zoloft50)& Insomnia; her PCP is DrGates... EXAM reveals Afeb, VSS, O2sat=94% on RA;  HEENT- neg, mallampati1;  Chest- decr BS bilat, clear w/o w/r/r;  Heart- sl irregularity, gr1/6 SEM w/o r/g;  Abd- soft, neg;  Ext- neg w/o c/c/e...  LABS in Epic from 2/18> LFTs are wnl;  CBC- ok w/ Hg=12.2;  TSH=1.17 IMP/PLAN>>  Sabel's dyspnea is stable on her current regimen & she is rec to continue same + incr exercise program as discussed;  She will maintain f/u w/ PCP-DrGates, and Cards-DrCamnitz; we plan ROV recheck in 42mo w/ f/u CXR etc...   ~  April 10, 2017:  25mo ROV & [name] again indicates a good interval, feeling well & no new complaints or concerns> she reports breathing well w/o cough, sput, hemoptysis or change in SOB/DOE (eg- walking uphill, in a hurry, going up stairs);  She requests written Rx for a rolling walker w/ brakes and a seat to use when exerting;  She remains on Advair115- 2spBid & Klonopin 0.5mg - 1/2 tab Bid;  She reports having a recent f/u appt  w/ DrGates & he referred her for a "pharmacy visit" due to some memory changes-- they rec stopping the AM Klonopin & continuing the eve dose; she asked my opinion of this change & I reminded her that this med is written PRN to help w/ her dyspnea so I certainly agree w/ trying to back off the frequency or the dose of the med to see how she does... We reviewed the recent medical specialty visits in Epic>>     She saw CARDS-EP, DrCamnitz, 03/28/17>  F/u PAF, holding NSR on Amio200 (mostly for PVCs on subseq holter) and Eliquis2.5Bid; she remains asymptomatic, no changes made... We reviewed the following medical problems during today's office visit>     Dyspnea- multifactorial>  This improved on Klonopin taking 0.5mg - 1/2 to 1 tab bid, she has increased her activities & doing satis; Pharm has rec decr Klonopin to 1/2 tab daily in eve- ok...Marland KitchenMarland KitchenMarland Kitchen    Mod obstructive lung dis w/ a revers component & GOLD Stage3 COPD on PFTs> on  ADVAIR115-2spBid & she is improved w/ better exercise tolerance    Bronchiectasis & pleuroparenchymal scarring on CT, r/o MAI infection/ Lady Winder[place] syndrome> QuantGOLD is neg, AFB cult was neg in 8/16; she denies cough, sputum, hemoptysis, etc...    Pseudomonas colonization> the pseudomonas grew on C&S from 8/16 & was sens to Cipro...    Cardiac issues>  PAF & other arrhythmias as noted (PVCs)-  eval & Rx per DrVaranasi => DrCamnitz;  She has had mult EKGs and Holter checks, now on Amio200/ Eliquis2.5Bid/ Lasix40prn    Medical issues>  Hearing loss, tinnitus, GERD, DJD, VitD defic (on 2000u daily)depression (on Zoloft50) & Insomnia; her PCP is DrGates... EXAM reveals Afeb, VSS, O2sat=96% on RA;  HEENT- neg, mallampati1;  Chest- decr BS bilat, clear w/o w/r/r;  Heart- sl irregularity, gr1/6 SEM w/o r/g;  Abd- soft, neg;  Ext- neg w/o c/c/e... IMP/PLAN>>  [name] is now 84 y/o & slowing down some;  She requests Rx for a rolling walker w/ seat- ok;  She is stable from the pulm standpoint & will continue the Advair115 & the lower dose of Klonopin; reminded to call for any breathing issues we plan recheck in 44mo...       ~  Oct 25, 2017:  70mo ROV & [name] continues to do well- no intercurrent resp exacerbvations etc, no new complaints or concerns; she notes that her breathing is stable "It's all good" she says, min cough, no sput or hemoptysis, she denies SOB- takes out the garbage, able to walk up some inclines & no prob w/ level ground (her exercise is walking); denies CP/ palpit/ edema... She is due for a follow up CXR today... We reviewed the following medical problems during today's office visit>     Dyspnea- multifactorial>  This improved on Klonopin taking 0.5mg - 1/2 to 1 tab bid, she has increased her activities & doing satis; she has  decr Klonopin to 1/2 tab prn...    Mod obstructive lung dis w/ a revers component & GOLD Stage3 COPD on PFTs> on ADVAIR115-2spBid & she is improved w/ better exercise  tolerance    Bronchiectasis & pleuroparenchymal scarring on CT, r/o MAI infection/ Lady Winder[place] syndrome> QuantGOLD is neg, AFB cult was neg in 8/16; she denies cough, sputum, hemoptysis, etc; CXR stable...    Pseudomonas colonization> the pseudomonas grew on C&S from 8/16 & was sens to Cipro...    Cardiac issues>  PAF & other arrhythmias as noted (PVCs)-  eval & Rx per DrVaranasi => DrCamnitz;  She has had mult EKGs and Holter checks, now on Amio200/ Eliquis2.5Bid/ Lasix40prn    Medical issues>  Hearing loss, tinnitus, GERD, DJD, VitD defic (on 2000u daily)depression (on Zoloft50) & Insomnia; her PCP is DrGates... EXAM reveals Afeb, VSS, O2sat=95% on RA;  HEENT- neg, mallampati1;  Chest- decr BS bilat, clear w/o w/r/r;  Heart- sl irregularity, gr1/6 SEM w/o r/g;  Abd- soft, neg;  Ext- neg w/o c/c/e...  CXR 10/25/17 (independently reviewed by me in the PACS system) shows norm heart size, stable changes of COPD, coarsened interstitial markings, biapical pleuroparenchymal thickening, architectural distortion in the hilar regions, no evid of acute superimposed cardiopulm dis...  IMP/PLAN>>  [name] remains stable at 58, reminded to use the Klonopin when she feels anxious/ SOB/ can't get the air "in"; encouraged to stay active/ exercise, but try to avoid infections; she is up to date on indicated vaccines; we plan one further f/u visit in 66mo before my retirement...     Past Medical History:  Diagnosis Date  . Bronchiectasis ([place])    with multiple pneumonia back in 1990's  . Bronchitis    episodic  . DJD (degenerative joint disease) of knee   . GERD (gastroesophageal reflux disease)    episodic symptoms  . Hearing loss    chronic moderate hearing loss, worked up by ENT and hearing aids have been recommended  . History of nuclear stress test 11/07   NORMAL  . Insomnia   . PVC's (premature ventricular contractions)    on EKG, controlled on atenolol and caffeine reduction  . Tinnitus    chronic      Past Surgical History:  Procedure Laterality Date  . BREAST EXCISIONAL BIOPSY Left   . CARDIOVERSION N/A 08/31/2015   Procedure: CARDIOVERSION;  Surgeon: Jerline Pain, MD;  [facility] ENDOSCOPY;  Service: Cardiovascular;  Laterality: N/A;  . cyst removed     left breast    Outpatient Encounter Medications as of 10/25/2017  Medication Sig  . ADVAIR HFA 115-21 MCG/ACT inhaler INHALE 2 PUFFS INTO THE LUNGS TWICE DAILY (Patient taking differently: INHALE 1-2 PUFFS INTO THE LUNGS DAILY)  . amiodarone (PACERONE) 200 MG tablet Take one (1) tablet (200 mg) by mouth daily.  Marland Kitchen ELIQUIS 2.5 MG TABS tablet TAKE 1 TABLET(2.5 MG) BY MOUTH TWICE DAILY  . furosemide (LASIX) 40 MG tablet Take 40 mg by mouth daily as needed.  . sertraline (ZOLOFT) 50 MG tablet Take 50 mg by mouth daily.  . clonazePAM (KLONOPIN) 0.5 MG tablet TAKE 1/2 tablet in the PM BY MOUTH daily (Patient not taking: Reported on 10/25/2017)   No facility-administered encounter medications on file as of 10/25/2017.     Allergies  Allergen Reactions  . Augmentin [Amoxicillin-Pot Clavulanate] Nausea And Vomiting  . Biaxin [Clarithromycin] Nausea And Vomiting  . Metoprolol Other (See Comments)    Patient notes she "feels funny" after taking it  . Tequin [Gatifloxacin] Nausea Only    Immunization History  Administered Date(s) Administered  . Influenza, High Dose Seasonal PF 02/27/2016  . Influenza,inj,Quad PF,6+ Mos 03/28/2017  . Influenza-Unspecified 03/01/2015  . Pneumococcal Conjugate-13 10/15/2013  . Pneumococcal-Unspecified 02/27/2016    Current Medications, Allergies, Past Medical History, Past Surgical History, Family History, and Social History were reviewed in [employer] record.   Review of Systems            All symptoms NEG except where BOLDED >>  Constitutional:  F/C/S, fatigue, anorexia,  unexpected weight change. HEENT:  HA, visual changes, hearing loss, earache, nasal symptoms, sore  throat, mouth sores, hoarseness. Resp:  cough, sputum, hemoptysis; SOB, tightness, wheezing. Cardio:  CP, palpit, DOE, orthopnea, edema. GI:  N/V/D/C, blood in stool; reflux, abd pain, distention, gas. GU:  dysuria, freq, urgency, hematuria, flank pain, voiding difficulty. MS:  joint pain, swelling, tenderness, decr ROM; neck pain, back pain, etc. Neuro:  HA, tremors, seizures, dizziness, syncope, weakness, numbness, gait abn. Skin:  suspicious lesions or skin rash. Heme:  adenopathy, bruising, bleeding. Psyche:  confusion, agitation, sleep disturbance, hallucinations, anxiety, depression suicidal.   Objective:   Physical Exam      Vital Signs:  Reviewed...   General:  WD, WN, 84 y/o WM in NAD; alert & oriented; pleasant & cooperative... HEENT:  [place]/AT; Conjunctiva- pink, Sclera- nonicteric, EOM-wnl, PERRLA, EACs-clear, TMs-wnl; NOSE-clear; THROAT-clear & wnl.  Neck:  Supple w/ fairl ROM; no JVD; normal carotid impulses w/o bruits; no thyromegaly or nodules palpated; no lymphadenopathy.  Chest:  Sl decr BS at bases, clear to P & A without wheezes, rales, or signs of consolidation... Heart:  Regular Rhythm; norm S1 & S2 without murmurs, rubs, or gallops detected. Abdomen:  Soft & nontender- no guarding or rebound; normal bowel sounds; no organomegaly or masses palpated. Ext:  decrROM; without deformities +arthritic changes; no varicose veins, venous insuffic, or edema;  Pulses intact w/o bruits. Neuro:  No focal neuro deficits; sensory testing normal; gait normal & balance OK. Derm:  No lesions noted; no rash etc. Lymph:  No cervical, supraclavicular, axillary, or inguinal adenopathy palpated.   Assessment:      IMP >>     Mod obstructive lung dis w/ a revers component & GOLD Stage3 COPD on PFTs> on ADVAIR115-2spBid & she was improved w/ incr exercise tolerance    Bronchiectasis & pleuroparenchymal scarring on CT, r/o MAI infection/ [place] syndrome> QuantGOLD is neg, AFB cult  is neg x1    Pseudomonas colonization> the pseudomonas was sens to Cipro...    PAF>  eval & Rx per DrVaranasi on Flecainide50Bid, CardizemCD120, Eliquis2.5Bid and Lasix40 prn; EKG today shows NSR but assoc w/ few pauses (~1.7sec, HR down to ~40s)  PLAN >> 12/28/14>   10 y/o woman w/ mild smoking hx & quit 30 yrs ago; she has mod obstructive lung dis on PFT w/ reversible component AND decr DLCO; Labs appear neg & QuantGold is neg as well- still could have underlying bronchiectasis & poss atypic mycobactium, awaiting Hi-res CT Chest, consider getting sput for cult/ AFB; In any event her parenchymal dis does not appear to be very progressive;  REC- trial SYMBICORT160- 2sp Bid and we will recheck pt in 4-6 weeks. 02/08/15>   [name] is stable=> improved, asked to continue the [place]; she has already had the 2016 Flu vaccine; we plan ROV in 3-4 moths, sooner if needed... 06/11/15>  [name] remains stable-- she will decide if she prefers the MDI vs the DPI inhaletr; asked to incr exercise program, call for any problems and we plan routine f/u 54mo. 11/04/15>   She has had a lot of trouble w/ AFib; ?what is causing her dyspnea sensation (?cardiac arrhythmia, anxiety, chest wall factors); we will consider checking a holter monitor & start rx w/ Klonopin 0.5mg - 1/2to1tab Bid... 12/02/15>   Much improved on the Klonopin; still w/ slow HR, sl irreg w/ pauses, & we will proceed w/ HOLTER MONITOR... 06/07/16>   [name] remains stable on the current regimen- continue same & call  for any problems... 10/05/16>   [name]'s dyspnea is stable on her current regimen & she is rec to continue same + incr exercise program as discussed;  She will maintain f/u w/ PCP-DrGates, and Cards-DrCamnitz; we plan ROV recheck in 75mo w/ f/u CXR etc. 04/10/17>   [name] is now 84 y/o & slowing down some;  She requests Rx for a rolling walker w/ seat- ok;  She is stable from the pulm standpoint & will continue the Advair115 & the lower dose of Klonopin;  reminded to call for any breathing issues we plan recheck in 77mo. 10/25/17>   [name] remains stable at 60, reminded to use the Klonopin when she feels anxious/ SOB/ can't get the air "in"; encouraged to stay active/ exercise, but try to avoid infections; she is up to date on indicated vaccines; we plan one further f/u visit in 44mo before my retirement.   Plan:     Patient's Medications  New Prescriptions   No medications on file  Previous Medications   ADVAIR HFA 115-21 MCG/ACT INHALER    INHALE 2 PUFFS INTO THE LUNGS TWICE DAILY   AMIODARONE (PACERONE) 200 MG TABLET    Take one (1) tablet (200 mg) by mouth daily.   CLONAZEPAM (KLONOPIN) 0.5 MG TABLET    TAKE 1/2 tablet in the PM BY MOUTH daily   ELIQUIS 2.5 MG TABS TABLET    TAKE 1 TABLET(2.5 MG) BY MOUTH TWICE DAILY   FUROSEMIDE (LASIX) 40 MG TABLET    Take 40 mg by mouth daily as needed.   SERTRALINE (ZOLOFT) 50 MG TABLET    Take 50 mg by mouth daily.  Modified Medications   No medications on file  Discontinued Medications   No medications on file

## 2017-10-25 NOTE — Patient Instructions (Signed)
Today we updated your med list in our EPIC system...    Continue your current medications the same...  Continue your Advair inhaler => 2 sprays twice daily & rinse...  Stay as active as possible & consider silver sneakers etc...  Today we did a follow up CXR...    We will contact you w/ the results when available...   Call for any questions...  Let's plan a follow up visit in 71mo, sooner if needed for problems.Marland KitchenMarland Kitchen

## 2017-10-29 ENCOUNTER — Telehealth: Payer: Self-pay | Admitting: Pulmonary Disease

## 2017-10-29 NOTE — Telephone Encounter (Signed)
Called and spoke with Patient.  She was wanting her results from CXR.  Results and recommendations given.  Patient stated understanding. Nothing further needed at this time.

## 2017-11-19 DIAGNOSIS — H538 Other visual disturbances: Secondary | ICD-10-CM | POA: Diagnosis not present

## 2017-11-19 DIAGNOSIS — H5371 Glare sensitivity: Secondary | ICD-10-CM | POA: Diagnosis not present

## 2017-11-19 DIAGNOSIS — H25813 Combined forms of age-related cataract, bilateral: Secondary | ICD-10-CM | POA: Diagnosis not present

## 2017-12-11 DIAGNOSIS — D229 Melanocytic nevi, unspecified: Secondary | ICD-10-CM | POA: Diagnosis not present

## 2017-12-11 DIAGNOSIS — L821 Other seborrheic keratosis: Secondary | ICD-10-CM | POA: Diagnosis not present

## 2017-12-11 DIAGNOSIS — L570 Actinic keratosis: Secondary | ICD-10-CM | POA: Diagnosis not present

## 2017-12-25 ENCOUNTER — Ambulatory Visit (INDEPENDENT_AMBULATORY_CARE_PROVIDER_SITE_OTHER): Payer: Medicare Other | Admitting: Cardiology

## 2017-12-25 ENCOUNTER — Encounter: Payer: Self-pay | Admitting: Cardiology

## 2017-12-25 VITALS — BP 144/74 | HR 47 | Ht 62.0 in | Wt 101.8 lb

## 2017-12-25 DIAGNOSIS — I480 Paroxysmal atrial fibrillation: Secondary | ICD-10-CM

## 2017-12-25 MED ORDER — AMIODARONE HCL 200 MG PO TABS: 100.0000 mg | ORAL_TABLET | Freq: Every day | ORAL | 3 refills | Status: DC

## 2017-12-25 NOTE — Progress Notes (Signed)
12/25/2017 [name]   1930/07/06  [id number]  Primary Physician Josetta Huddle, MD Primary Cardiologist: Dr. Irish Lack  Electrophysiologist: Dr. Curt Bears  Reason for Visit/CC: F/u for PAF and PVCs  HPI:  [name] is a 84 y.o. female who is being seen today for routine cardiac evaluation.  She has a history of atrial fibrillation and frequent PVCs.  She was previously on flecainide but she is now on amiodarone.  She is also followed in EP clinic by Dr. Curt Bears.  She is on chronic anticoagulation therapy with Eliquis for stroke prophylaxis.  She has not been able to tolerate much in terms of rate control due to a history of significant bradycardia with Cardizem. There is no documented history of coronary disease.  She had a nuclear stress test in 2017 that was low risk for ischemia.  Echocardiogram in 2017 also showed normal left ventricular EF and normal wall motion.  In follow-up today, she reports that she has done fairly well from a symptom standpoint.  Denies chest pain, palpitations, dyspnea, lower extremity edema, orthopnea, PND, fatigued, dizziness, syncope and near syncope.  She reports full medication compliance.  She denies any side effects or intolerances.  No abnormal bleeding with Eliquis.  She also denies any falls.  EKG today shows sinus bradycardia, 47 bpm (completely asymptomatic). BP is 144/74.    Current Meds  Medication Sig  . ADVAIR HFA 115-21 MCG/ACT inhaler INHALE 2 PUFFS INTO THE LUNGS TWICE DAILY (Patient taking differently: INHALE 1-2 PUFFS INTO THE LUNGS DAILY)  . amiodarone (PACERONE) 200 MG tablet Take one (1) tablet (200 mg) by mouth daily.  . Cholecalciferol (VITAMIN D3) 2000 units TABS Take 1 capsule by mouth daily.  . clonazePAM (KLONOPIN) 0.5 MG tablet Take 0.25 mg by mouth 2 (two) times daily as needed for anxiety.  Marland Kitchen ELIQUIS 2.5 MG TABS tablet TAKE 1 TABLET(2.5 MG) BY MOUTH TWICE DAILY  . furosemide (LASIX) 40 MG tablet Take 40 mg by mouth daily as  needed.  . sertraline (ZOLOFT) 50 MG tablet Take 50 mg by mouth daily.  . [DISCONTINUED] clonazePAM (KLONOPIN) 0.5 MG tablet TAKE 1/2 tablet in the PM BY MOUTH daily (Patient taking differently: 0.5 mg. TAKE 1/2 tablet in the PM BY MOUTH daily)   Allergies  Allergen Reactions  . Augmentin [Amoxicillin-Pot Clavulanate] Nausea And Vomiting  . Biaxin [Clarithromycin] Nausea And Vomiting  . Metoprolol Other (See Comments)    Patient notes she "feels funny" after taking it  . Tequin [Gatifloxacin] Nausea Only   Past Medical History:  Diagnosis Date  . Bronchiectasis ([place])    with multiple pneumonia back in 1990's  . Bronchitis    episodic  . DJD (degenerative joint disease) of knee   . GERD (gastroesophageal reflux disease)    episodic symptoms  . Hearing loss    chronic moderate hearing loss, worked up by ENT and hearing aids have been recommended  . History of nuclear stress test 11/07   NORMAL  . Insomnia   . PVC's (premature ventricular contractions)    on EKG, controlled on atenolol and caffeine reduction  . Tinnitus    chronic   Family History  Problem Relation Age of Onset  . Stroke Father   . Hypertension Father   . Stroke Mother   . Hypertension Mother   . Asthma Sister   . Clotting disorder Sister   . Heart attack Neg Hx    Past Surgical History:  Procedure Laterality Date  .  BREAST EXCISIONAL BIOPSY Left   . CARDIOVERSION N/A 08/31/2015   Procedure: CARDIOVERSION;  Surgeon: Jerline Pain, MD;  [facility] END[facility];  Service: Cardiovascular;  Laterality: N/A;  . cyst removed     left breast   Social History   Socioeconomic History  . Marital status: Divorced    Spouse name: Not on file  . Number of children: Not on file  . Years of education: Not on file  . Highest education level: Not on file  Occupational History  . Occupation: retired  Scientific laboratory technician  . Financial resource strain: Not on file  . Food insecurity:    Worry: Not on file    Inability:  Not on file  . Transportation needs:    Medical: Not on file    Non-medical: Not on file  Tobacco Use  . Smoking status: Former Smoker    Packs/day: 0.50    Years: 20.00    Pack years: 10.00    Types: Cigarettes    Last attempt to quit: 05/29/1968    Years since quitting: 49.6  . Smokeless tobacco: Never Used  Substance and Sexual Activity  . Alcohol use: Yes    Alcohol/week: 0.0 oz    Comment: 1 can of beer at night  . Drug use: No  . Sexual activity: Not on file  Lifestyle  . Physical activity:    Days per week: Not on file    Minutes per session: Not on file  . Stress: Not on file  Relationships  . Social connections:    Talks on phone: Not on file    Gets together: Not on file    Attends religious service: Not on file    Active member of club or organization: Not on file    Attends meetings of clubs or organizations: Not on file    Relationship status: Not on file  . Intimate partner violence:    Fear of current or ex partner: Not on file    Emotionally abused: Not on file    Physically abused: Not on file    Forced sexual activity: Not on file  Other Topics Concern  . Not on file  Social History Narrative  . Not on file     Review of Systems: General: negative for chills, fever, night sweats or weight changes.  Cardiovascular: negative for chest pain, dyspnea on exertion, edema, orthopnea, palpitations, paroxysmal nocturnal dyspnea or shortness of breath Dermatological: negative for rash Respiratory: negative for cough or wheezing Urologic: negative for hematuria Abdominal: negative for nausea, vomiting, diarrhea, bright red blood per rectum, melena, or hematemesis Neurologic: negative for visual changes, syncope, or dizziness All other systems reviewed and are otherwise negative except as noted above.   Physical Exam:  Blood pressure (!) 144/74, pulse (!) 47, height 5\' 2"  (1.575 m), weight 101 lb 12.8 oz (46.2 kg), SpO2 96 %.  General appearance: elderly  WF, alert, cooperative and no distress Neck: no carotid bruit and no JVD Lungs: clear to auscultation bilaterally Heart: regular rate and rhythm, S1, S2 normal, no murmur, click, rub or gallop Extremities: extremities normal, atraumatic, no cyanosis or edema Pulses: 2+ and symmetric Skin: Skin color, texture, turgor normal. No rashes or lesions Neurologic: Grossly normal  EKG Sinus bradycardia, 47 bpm -- personally reviewed   ASSESSMENT AND PLAN:   1. PAF: Maintaining NSR with amiodarone. On Eliquis for a/c.  Labs were recently checked by Dr. Curt Bears April 2019 for medication monitoring including TSH, hepatic function, basic  metabolic panel and CBC.  All were within normal limits. She denies any abnormal bleeding with Eliquis. No falls.  Due to significant bradycardia we will reduce the dose of her amiodarone down from 200 mg to 100 mg daily.  2. PVCs: Holter monitor in the past shoed 18% PVC burden. Now controlled w/ Amiodarone. She is followed by EP.   3. Bradycardia:  Cardizem was previously discontinued. EKG shows sinus brady with HR of 47 bpm but completely asymptomatic. She is on amiodarone 200 mg daily. I've discussed medication dose with Dr. Marlou Porch, DOD.  He has recommended that we try cutting her amiodarone in half to 100 mg daily.  She will monitor for recurrence of symptoms (palpitations) over the next month.  If she starts to have symptoms of palpitations then she can go back to the 200 mg daily. Her daughter will monitor her HR at home.   Follow-Up w/ Dr. Irish Lack in 1 year and with Dr. Curt Bears as directed.   Kirsten Mckone Ladoris Gene, MHS [facility] 12/25/2017 2:14 PM

## 2017-12-25 NOTE — Patient Instructions (Addendum)
Medication Instructions:    START  TAKING AMIODARONE 100 MG  ONCE A DAY   If you need a refill on your cardiac medications before your next appointment, please call your pharmacy.  Labwork: NONE ORDERED  TODAY    Testing/Procedures: NONE ORDERED  TODAY    Follow-Up: Your physician wants you to follow-up in: [place] will receive a reminder letter in the mail two months in advance. If you don't receive a letter, please call our office to schedule the follow-up appointment.     Any Other Special Instructions Will Be Listed Below (If Applicable).

## 2017-12-30 ENCOUNTER — Other Ambulatory Visit: Payer: Self-pay | Admitting: Pulmonary Disease

## 2018-01-01 ENCOUNTER — Telehealth: Payer: Self-pay | Admitting: Pulmonary Disease

## 2018-01-01 MED ORDER — FLUTICASONE-SALMETEROL 115-21 MCG/ACT IN AERO: 2.0000 | INHALATION_SPRAY | Freq: Two times a day (BID) | RESPIRATORY_TRACT | 3 refills | Status: DC

## 2018-01-01 NOTE — Telephone Encounter (Signed)
Called and spoke with patient advised that refill has been sent to pharmacy. Nothing further needed.

## 2018-01-16 ENCOUNTER — Other Ambulatory Visit: Payer: Self-pay | Admitting: Internal Medicine

## 2018-01-16 DIAGNOSIS — Z1231 Encounter for screening mammogram for malignant neoplasm of breast: Secondary | ICD-10-CM

## 2018-02-18 DIAGNOSIS — J439 Emphysema, unspecified: Secondary | ICD-10-CM | POA: Diagnosis not present

## 2018-02-18 DIAGNOSIS — F411 Generalized anxiety disorder: Secondary | ICD-10-CM | POA: Diagnosis not present

## 2018-02-18 DIAGNOSIS — I10 Essential (primary) hypertension: Secondary | ICD-10-CM | POA: Diagnosis not present

## 2018-02-18 DIAGNOSIS — R634 Abnormal weight loss: Secondary | ICD-10-CM | POA: Diagnosis not present

## 2018-02-18 DIAGNOSIS — E782 Mixed hyperlipidemia: Secondary | ICD-10-CM | POA: Diagnosis not present

## 2018-02-18 DIAGNOSIS — I4891 Unspecified atrial fibrillation: Secondary | ICD-10-CM | POA: Diagnosis not present

## 2018-02-18 DIAGNOSIS — Z23 Encounter for immunization: Secondary | ICD-10-CM | POA: Diagnosis not present

## 2018-02-25 ENCOUNTER — Telehealth: Payer: Self-pay | Admitting: *Deleted

## 2018-02-25 DIAGNOSIS — I4891 Unspecified atrial fibrillation: Secondary | ICD-10-CM | POA: Diagnosis not present

## 2018-02-25 DIAGNOSIS — I10 Essential (primary) hypertension: Secondary | ICD-10-CM | POA: Diagnosis not present

## 2018-02-25 DIAGNOSIS — E782 Mixed hyperlipidemia: Secondary | ICD-10-CM | POA: Diagnosis not present

## 2018-02-25 DIAGNOSIS — M1990 Unspecified osteoarthritis, unspecified site: Secondary | ICD-10-CM | POA: Diagnosis not present

## 2018-02-25 DIAGNOSIS — J439 Emphysema, unspecified: Secondary | ICD-10-CM | POA: Diagnosis not present

## 2018-02-25 MED ORDER — APIXABAN 2.5 MG PO TABS: ORAL_TABLET | ORAL | 2 refills | Status: DC

## 2018-02-25 NOTE — Telephone Encounter (Signed)
Pt's dtr called and stated the pt needs a 90 day supply instead of a 30 day supply. She states it is too, expensive to do 30 days.  Will send in a 90 day supply for the pt at this time. Pt is 84 yrs old, wt-46.2kg, Crea-0.83 on 09/07/17, last seen by Lyda Jester on 12/25/17.

## 2018-02-27 ENCOUNTER — Ambulatory Visit: Payer: PRIVATE HEALTH INSURANCE

## 2018-02-27 DIAGNOSIS — I10 Essential (primary) hypertension: Secondary | ICD-10-CM | POA: Diagnosis not present

## 2018-02-27 DIAGNOSIS — J439 Emphysema, unspecified: Secondary | ICD-10-CM | POA: Diagnosis not present

## 2018-02-27 DIAGNOSIS — M1990 Unspecified osteoarthritis, unspecified site: Secondary | ICD-10-CM | POA: Diagnosis not present

## 2018-02-27 DIAGNOSIS — I4891 Unspecified atrial fibrillation: Secondary | ICD-10-CM | POA: Diagnosis not present

## 2018-02-27 DIAGNOSIS — E782 Mixed hyperlipidemia: Secondary | ICD-10-CM | POA: Diagnosis not present

## 2018-02-27 DIAGNOSIS — J479 Bronchiectasis, uncomplicated: Secondary | ICD-10-CM | POA: Diagnosis not present

## 2018-03-06 ENCOUNTER — Encounter: Payer: Self-pay | Admitting: Cardiology

## 2018-03-06 ENCOUNTER — Ambulatory Visit (INDEPENDENT_AMBULATORY_CARE_PROVIDER_SITE_OTHER): Payer: Medicare Other | Admitting: Cardiology

## 2018-03-06 VITALS — BP 124/58 | HR 53 | Ht 62.0 in | Wt 104.0 lb

## 2018-03-06 DIAGNOSIS — I493 Ventricular premature depolarization: Secondary | ICD-10-CM

## 2018-03-06 DIAGNOSIS — I480 Paroxysmal atrial fibrillation: Secondary | ICD-10-CM | POA: Diagnosis not present

## 2018-03-06 NOTE — Progress Notes (Signed)
Electrophysiology Office Note   Date:  03/06/2018   ID:  [name], DOB June 15, 1930, MRN [id number]  PCP:  Josetta Huddle, MD  Cardiologist:  Christ Kick Primary Electrophysiologist:  Jayse Hodkinson Meredith Leeds, MD    No chief complaint on file.    History of Present Illness: [name] is a 84 y.o. female who presents today for electrophysiology evaluation.   History of paroxysmal atrial fibrillation and elevated CHA2DS2 VASc score of 3, on chronic anticoagulation with Eliquis. Had cardioversion 08/31/15 which was unsuccessful.  Flecainide was increased to 100 mg BID and she had attempted repeat cardioversion but was in sinus rhythm when she showed up to the hospital. Wore a holter monitor showing 18% PVCs but no atrial fibrillation with episodes of junctional rhythm and HR in the 20s during sleeping hours.   Today, denies symptoms of palpitations, chest pain, shortness of breath, orthopnea, PND, lower extremity edema, claudication, dizziness, presyncope, syncope, bleeding, or neurologic sequela. The patient is tolerating medications without difficulties.  He is overall feeling well.  She has minimal palpitations throughout the day.  She is able to do all of her daily activities without issues.   Past Medical History:  Diagnosis Date  . Bronchiectasis ([place])    with multiple pneumonia back in 1990's  . Bronchitis    episodic  . DJD (degenerative joint disease) of knee   . GERD (gastroesophageal reflux disease)    episodic symptoms  . Hearing loss    chronic moderate hearing loss, worked up by ENT and hearing aids have been recommended  . History of nuclear stress test 11/07   NORMAL  . Insomnia   . PVC's (premature ventricular contractions)    on EKG, controlled on atenolol and caffeine reduction  . Tinnitus    chronic   Past Surgical History:  Procedure Laterality Date  . BREAST EXCISIONAL BIOPSY Left   . CARDIOVERSION N/A 08/31/2015   Procedure: CARDIOVERSION;  Surgeon: Jerline Pain, MD;  [facility] END[facility]COPY;  Service: Cardiovascular;  Laterality: N/A;  . cyst removed     left breast     Current Outpatient Medications  Medication Sig Dispense Refill  . amiodarone (PACERONE) 200 MG tablet Take 0.5 tablets (100 mg total) by mouth daily. 90 tablet 3  . apixaban (ELIQUIS) 2.5 MG TABS tablet TAKE 1 TABLET(2.5 MG) BY MOUTH TWICE DAILY 180 tablet 2  . Cholecalciferol (VITAMIN D3) 2000 units TABS Take 1 capsule by mouth daily.    . clonazePAM (KLONOPIN) 0.5 MG tablet Take 0.25 mg by mouth 2 (two) times daily as needed for anxiety.    . fluticasone-salmeterol (ADVAIR HFA) 115-21 MCG/ACT inhaler Inhale 2 puffs into the lungs 2 (two) times daily. 12 g 3  . furosemide (LASIX) 40 MG tablet Take 40 mg by mouth daily as needed.    . sertraline (ZOLOFT) 50 MG tablet Take 50 mg by mouth daily.     No current facility-administered medications for this visit.     Allergies:   Augmentin [amoxicillin-pot clavulanate]; Biaxin [clarithromycin]; Metoprolol; and Tequin [gatifloxacin]   Social History:  The patient  reports that she quit smoking about 49 years ago. Her smoking use included cigarettes. She has a 10.00 pack-year smoking history. She has never used smokeless tobacco. She reports that she drinks alcohol. She reports that she does not use drugs.   Family History:  The patient's family history includes Asthma in her sister; Clotting disorder in her sister; Hypertension in her father and mother;  Stroke in her father and mother.   ROS:  Please see the history of present illness.   Otherwise, review of systems is positive for palptations.   All other systems are reviewed and negative.   PHYSICAL EXAM: VS:  BP (!) 124/58   Pulse (!) 53   Ht 5\' 2"  (1.575 m)   Wt 104 lb (47.2 kg)   BMI 19.02 kg/m  , BMI Body mass index is 19.02 kg/m. GEN: Well nourished, well developed, in no acute distress  HEENT: normal  Neck: no JVD, carotid bruits, or masses Cardiac: iRRR; no  murmurs, rubs, or gallops,no edema  Respiratory:  clear to auscultation bilaterally, normal work of breathing GI: soft, nontender, nondistended, + BS MS: no deformity or atrophy  Skin: warm and dry Neuro:  Strength and sensation are intact Psych: euthymic mood, full affect  EKG:  EKG is ordered today. Personal review of the ekg ordered shows sinus rhythm, rate 53, biatrial enlargement, left posterior fascicular block  Recent Labs: 09/07/2017: ALT 22; BUN 15; Creatinine, Ser 0.83; Hemoglobin 13.4; Platelets 182; Potassium 4.3; Sodium 145; TSH 1.330    Lipid Panel     Component Value Date/Time   CHOL 163 07/03/2015 0440   TRIG 73 07/03/2015 0440   HDL 57 07/03/2015 0440   CHOLHDL 2.9 07/03/2015 0440   VLDL 15 07/03/2015 0440   LDLCALC 91 07/03/2015 0440     Wt Readings from Last 3 Encounters:  03/06/18 104 lb (47.2 kg)  12/25/17 101 lb 12.8 oz (46.2 kg)  10/25/17 102 lb 9.6 oz (46.5 kg)      Other studies Reviewed: Additional studies/ records that were reviewed today include: TTE 07/03/15  Review of the above records today demonstrates:  - Left ventricle: The cavity size was normal. Systolic function was   normal. The estimated ejection fraction was in the range of 55%   to 60%. Wall motion was normal; there were no regional wall   motion abnormalities.  Holter 12/13/15  NSR, intermittent junctional rhythm.  PACs, PVCs.  Minimum HR 30 at 4AM, presumably during sleep.  No atrial fibrillation.  Holter 02/28/16 Minimum HR: 17 BPM at 3:44:42 AM Maximum HR: 114 BPM at 9:07:34 AM Average HR: 51 BPM 18.2% PVCs 2.9% APCs Sinus rhytnm Zero atrial fibrillation Occasional junctional rhythm during sleeping hours 4.8 second sinus pause during sleeping hours Chest pounding associated with ventricular bigeminy Longest ventriclar run 12 beats  ASSESSMENT AND PLAN:  1.  Parosysmal atrial fibrillation: On amiodarone 100 mg and Eliquis.  No rate controlling medicines as she  has had significant bradycardia in the past.  No changes.    This patients CHA2DS2-VASc Score and unadjusted Ischemic Stroke Rate (% per year) is equal to 3.2 % stroke rate/year from a score of 3  Above score calculated as 1 point each if present [CHF, HTN, DM, Vascular=MI/PAD/Aortic Plaque, Age if 65-74, or Female] Above score calculated as 2 points each if present [Age > 75, or Stroke/TIA/TE]  2. PVCs: 18% on monitor.  Much improved on amiodarone.  Checking LFTs and TSH today.   Current medicines are reviewed at length with the patient today.   The patient has concerns regarding her medicines.  The following changes were made today: None  Labs/ tests ordered today include:  Orders Placed This Encounter  Procedures  . Hepatic function panel  . TSH  . EKG 12-Lead     Disposition:   FU with Flint Hakeem 6 months  Signed, Shaunda Tipping Hassell Done  Curt Bears, MD  03/06/2018 4:26 PM     [address] [place] [address] [phone] (office) [phone] (fax)

## 2018-03-06 NOTE — Patient Instructions (Signed)
Medication Instructions:  Your provider recommends that you continue on your current medications as directed. Please refer to the Current Medication list given to you today.   If you need a refill on your cardiac medications before your next appointment, please call your pharmacy.   Lab work: TODAY: LFTs, TSH If you have labs (blood work) drawn today and your tests are completely normal, you will receive your results only by: Marland Kitchen MyChart Message (if you have MyChart) OR . A paper copy in the mail If you have any lab test that is abnormal or we need to change your treatment, we will call you to review the results.  Testing/Procedures: None  Follow-Up: Your provider wants you to follow-up in: 6 months with Dr. Curt Bears. You will receive a reminder letter in the mail two months in advance. If you don't receive a letter, please call our office to schedule the follow-up appointment.

## 2018-03-07 LAB — HEPATIC FUNCTION PANEL
ALBUMIN: 4.4 g/dL (ref 3.5–4.7)
ALK PHOS: 85 IU/L (ref 39–117)
ALT: 22 IU/L (ref 0–32)
AST: 33 IU/L (ref 0–40)
Bilirubin Total: 0.5 mg/dL (ref 0.0–1.2)
Bilirubin, Direct: 0.15 mg/dL (ref 0.00–0.40)
Total Protein: 7 g/dL (ref 6.0–8.5)

## 2018-03-07 LAB — TSH: TSH: 1.5 u[IU]/mL (ref 0.450–4.500)

## 2018-03-19 ENCOUNTER — Telehealth: Payer: Self-pay | Admitting: Pulmonary Disease

## 2018-03-19 NOTE — Telephone Encounter (Signed)
Per [name], Dr. Lenna Mcdonald is going to give pt samples of symbicort 80 which is the equivalent of the Advair HFA 115.  Called pt's daughter [name] to let her know that we were going to give pt samples of the symbicort which is equivalent.  [name] expressed understanding. Nothing further needed.

## 2018-03-19 NOTE — Telephone Encounter (Signed)
Patients daughter called in regards to patients advair, she is requesting samples. Her insurance will not cover medication until 04/01/18 and she is out of medication.

## 2018-03-19 NOTE — Telephone Encounter (Signed)
Spoke with Dr. Lenna Gilford in regards to the Advair HFA.  Dr. Lenna Gilford did have a sample of the Advair HFA 115.  Called and spoke with pt's daughter Eustaquio Maize letting her know that we do have a sample that we are going to place up front for pt.  Beth expressed understanding. Beth stated she is going to send her husband Frazier Richards to the office to pick up the sample.  Sample has been placed up front by Salome Arnt, LPN. Nothing further needed.

## 2018-03-19 NOTE — Telephone Encounter (Signed)
Called pt's daughter Eustaquio Maize to let her know that I had been informed by Salome Arnt that the sample of the Advair HFA that SN had given for pt was expired. Stated to her we were going to check with Dr. Lenna Gilford to see if he wanted to change the inhaler to a different one so we can be able to provide her a sample.  Beth expressed understanding.

## 2018-03-28 ENCOUNTER — Ambulatory Visit: Payer: Medicare Other

## 2018-04-15 DIAGNOSIS — I4891 Unspecified atrial fibrillation: Secondary | ICD-10-CM | POA: Diagnosis not present

## 2018-04-15 DIAGNOSIS — E782 Mixed hyperlipidemia: Secondary | ICD-10-CM | POA: Diagnosis not present

## 2018-04-15 DIAGNOSIS — I10 Essential (primary) hypertension: Secondary | ICD-10-CM | POA: Diagnosis not present

## 2018-04-15 DIAGNOSIS — J439 Emphysema, unspecified: Secondary | ICD-10-CM | POA: Diagnosis not present

## 2018-04-15 DIAGNOSIS — M1990 Unspecified osteoarthritis, unspecified site: Secondary | ICD-10-CM | POA: Diagnosis not present

## 2018-04-15 DIAGNOSIS — J479 Bronchiectasis, uncomplicated: Secondary | ICD-10-CM | POA: Diagnosis not present

## 2018-04-24 DIAGNOSIS — E559 Vitamin D deficiency, unspecified: Secondary | ICD-10-CM | POA: Diagnosis not present

## 2018-04-24 DIAGNOSIS — J479 Bronchiectasis, uncomplicated: Secondary | ICD-10-CM | POA: Diagnosis not present

## 2018-04-24 DIAGNOSIS — E782 Mixed hyperlipidemia: Secondary | ICD-10-CM | POA: Diagnosis not present

## 2018-04-24 DIAGNOSIS — H918X3 Other specified hearing loss, bilateral: Secondary | ICD-10-CM | POA: Diagnosis not present

## 2018-04-24 DIAGNOSIS — I10 Essential (primary) hypertension: Secondary | ICD-10-CM | POA: Diagnosis not present

## 2018-04-24 DIAGNOSIS — J439 Emphysema, unspecified: Secondary | ICD-10-CM | POA: Diagnosis not present

## 2018-04-24 DIAGNOSIS — Z79899 Other long term (current) drug therapy: Secondary | ICD-10-CM | POA: Diagnosis not present

## 2018-04-24 DIAGNOSIS — M1990 Unspecified osteoarthritis, unspecified site: Secondary | ICD-10-CM | POA: Diagnosis not present

## 2018-04-24 DIAGNOSIS — I4891 Unspecified atrial fibrillation: Secondary | ICD-10-CM | POA: Diagnosis not present

## 2018-04-30 ENCOUNTER — Ambulatory Visit: Payer: Medicare Other | Admitting: Pulmonary Disease

## 2018-05-07 ENCOUNTER — Telehealth: Payer: Self-pay

## 2018-05-07 MED ORDER — AMIODARONE HCL 200 MG PO TABS: 100.0000 mg | ORAL_TABLET | Freq: Every day | ORAL | 2 refills | Status: DC

## 2018-05-07 NOTE — Telephone Encounter (Signed)
Error

## 2018-05-15 ENCOUNTER — Encounter: Payer: Self-pay | Admitting: Pulmonary Disease

## 2018-05-15 ENCOUNTER — Ambulatory Visit: Payer: Medicare Other | Admitting: Pulmonary Disease

## 2018-05-15 VITALS — BP 136/70 | HR 83 | Temp 97.7°F | Ht 62.0 in | Wt 105.6 lb

## 2018-05-15 DIAGNOSIS — J479 Bronchiectasis, uncomplicated: Secondary | ICD-10-CM

## 2018-05-15 DIAGNOSIS — J449 Chronic obstructive pulmonary disease, unspecified: Secondary | ICD-10-CM

## 2018-05-15 DIAGNOSIS — I480 Paroxysmal atrial fibrillation: Secondary | ICD-10-CM

## 2018-05-15 DIAGNOSIS — R0600 Dyspnea, unspecified: Secondary | ICD-10-CM

## 2018-05-15 MED ORDER — FLUTICASONE-SALMETEROL 115-21 MCG/ACT IN AERO: 2.0000 | INHALATION_SPRAY | Freq: Two times a day (BID) | RESPIRATORY_TRACT | 6 refills | Status: DC

## 2018-05-15 NOTE — Patient Instructions (Addendum)
Today we updated your med list in our EPIC system...    Continue your current medications the same...  Pay attention to nutrition and your exercise program...  Let's plan a follow up visit w/ my young partner, Dr. Rodman Pickle in about 6 months.Marland KitchenMarland Kitchen

## 2018-07-09 DIAGNOSIS — I10 Essential (primary) hypertension: Secondary | ICD-10-CM | POA: Diagnosis not present

## 2018-07-09 DIAGNOSIS — E782 Mixed hyperlipidemia: Secondary | ICD-10-CM | POA: Diagnosis not present

## 2018-07-09 DIAGNOSIS — M1990 Unspecified osteoarthritis, unspecified site: Secondary | ICD-10-CM | POA: Diagnosis not present

## 2018-07-09 DIAGNOSIS — I4891 Unspecified atrial fibrillation: Secondary | ICD-10-CM | POA: Diagnosis not present

## 2018-07-09 DIAGNOSIS — J479 Bronchiectasis, uncomplicated: Secondary | ICD-10-CM | POA: Diagnosis not present

## 2018-07-09 DIAGNOSIS — J439 Emphysema, unspecified: Secondary | ICD-10-CM | POA: Diagnosis not present

## 2018-08-14 DIAGNOSIS — M1990 Unspecified osteoarthritis, unspecified site: Secondary | ICD-10-CM | POA: Diagnosis not present

## 2018-08-14 DIAGNOSIS — I10 Essential (primary) hypertension: Secondary | ICD-10-CM | POA: Diagnosis not present

## 2018-08-14 DIAGNOSIS — J479 Bronchiectasis, uncomplicated: Secondary | ICD-10-CM | POA: Diagnosis not present

## 2018-08-14 DIAGNOSIS — J439 Emphysema, unspecified: Secondary | ICD-10-CM | POA: Diagnosis not present

## 2018-08-14 DIAGNOSIS — I4891 Unspecified atrial fibrillation: Secondary | ICD-10-CM | POA: Diagnosis not present

## 2018-08-14 DIAGNOSIS — E782 Mixed hyperlipidemia: Secondary | ICD-10-CM | POA: Diagnosis not present

## 2018-09-16 ENCOUNTER — Ambulatory Visit: Payer: Medicare Other | Admitting: Cardiology

## 2018-11-05 DIAGNOSIS — J439 Emphysema, unspecified: Secondary | ICD-10-CM | POA: Diagnosis not present

## 2018-11-05 DIAGNOSIS — J479 Bronchiectasis, uncomplicated: Secondary | ICD-10-CM | POA: Diagnosis not present

## 2018-11-05 DIAGNOSIS — I10 Essential (primary) hypertension: Secondary | ICD-10-CM | POA: Diagnosis not present

## 2018-11-05 DIAGNOSIS — E782 Mixed hyperlipidemia: Secondary | ICD-10-CM | POA: Diagnosis not present

## 2018-11-05 DIAGNOSIS — I4891 Unspecified atrial fibrillation: Secondary | ICD-10-CM | POA: Diagnosis not present

## 2018-11-05 DIAGNOSIS — M1990 Unspecified osteoarthritis, unspecified site: Secondary | ICD-10-CM | POA: Diagnosis not present

## 2018-11-18 ENCOUNTER — Ambulatory Visit: Payer: PRIVATE HEALTH INSURANCE | Admitting: Pulmonary Disease

## 2018-12-20 DIAGNOSIS — I4891 Unspecified atrial fibrillation: Secondary | ICD-10-CM | POA: Diagnosis not present

## 2018-12-20 DIAGNOSIS — J439 Emphysema, unspecified: Secondary | ICD-10-CM | POA: Diagnosis not present

## 2018-12-20 DIAGNOSIS — J479 Bronchiectasis, uncomplicated: Secondary | ICD-10-CM | POA: Diagnosis not present

## 2018-12-20 DIAGNOSIS — I10 Essential (primary) hypertension: Secondary | ICD-10-CM | POA: Diagnosis not present

## 2018-12-20 DIAGNOSIS — E782 Mixed hyperlipidemia: Secondary | ICD-10-CM | POA: Diagnosis not present

## 2018-12-20 DIAGNOSIS — M1990 Unspecified osteoarthritis, unspecified site: Secondary | ICD-10-CM | POA: Diagnosis not present

## 2019-01-07 ENCOUNTER — Ambulatory Visit: Payer: Medicare Other | Admitting: Cardiology

## 2019-03-03 ENCOUNTER — Telehealth: Payer: Self-pay | Admitting: Cardiology

## 2019-03-03 NOTE — Telephone Encounter (Signed)
New Message  Patient's daughter is calling in to get approved to accompany patient to her appointment on 03/18/19 at 11:30 am with Dr. Curt Bears due to patient having trouble hearing. Please give patient's daughter a call back to confirm.

## 2019-03-03 NOTE — Telephone Encounter (Signed)
Left detailed message informing dtr ok for her to accompany mom to 10/20 appt d/t HOH. Advised to call office if she needed to discuss this further.

## 2019-03-18 ENCOUNTER — Other Ambulatory Visit: Payer: Self-pay

## 2019-03-18 ENCOUNTER — Ambulatory Visit (INDEPENDENT_AMBULATORY_CARE_PROVIDER_SITE_OTHER): Payer: Medicare Other | Admitting: Cardiology

## 2019-03-18 ENCOUNTER — Encounter: Payer: Self-pay | Admitting: Cardiology

## 2019-03-18 VITALS — BP 108/68 | HR 90 | Ht 62.0 in | Wt 98.0 lb

## 2019-03-18 DIAGNOSIS — Z79899 Other long term (current) drug therapy: Secondary | ICD-10-CM

## 2019-03-18 DIAGNOSIS — Z23 Encounter for immunization: Secondary | ICD-10-CM | POA: Diagnosis not present

## 2019-03-18 DIAGNOSIS — I493 Ventricular premature depolarization: Secondary | ICD-10-CM | POA: Diagnosis not present

## 2019-03-18 NOTE — Progress Notes (Signed)
Electrophysiology Office Note   Date:  03/18/2019   ID:  [name], DOB 1930/07/02, MRN [id number]  PCP:  Josetta Huddle, MD  Cardiologist:  Christ Kick Primary Electrophysiologist:  Deborrah Mabin Meredith Leeds, MD    No chief complaint on file.    History of Present Illness: [name] is a 85 y.o. female who presents today for electrophysiology evaluation.   History of paroxysmal atrial fibrillation and elevated CHA2DS2 VASc score of 3, on chronic anticoagulation with Eliquis. Had cardioversion 08/31/15 which was unsuccessful.  Flecainide was increased to 100 mg BID and she had attempted repeat cardioversion but was in sinus rhythm when she showed up to the hospital. Wore a holter monitor showing 18% PVCs but no atrial fibrillation with episodes of junctional rhythm and HR in the 20s during sleeping hours.   Today, denies symptoms of palpitations, chest pain, shortness of breath, orthopnea, PND, lower extremity edema, claudication, dizziness, presyncope, syncope, bleeding, or neurologic sequela. The patient is tolerating medications without difficulties.  Overall she is doing well.  She does have occasional palpitations that happen a few times a week.  The palpitations last a few seconds at a time.  She is otherwise able to do all her daily activities.  She has not been out of the house very much this summer.   Past Medical History:  Diagnosis Date  . Bronchiectasis ([place])    with multiple pneumonia back in 1990's  . Bronchitis    episodic  . DJD (degenerative joint disease) of knee   . GERD (gastroesophageal reflux disease)    episodic symptoms  . Hearing loss    chronic moderate hearing loss, worked up by ENT and hearing aids have been recommended  . History of nuclear stress test 11/07   NORMAL  . Insomnia   . PVC's (premature ventricular contractions)    on EKG, controlled on atenolol and caffeine reduction  . Tinnitus    chronic   Past Surgical History:  Procedure  Laterality Date  . BREAST EXCISIONAL BIOPSY Left   . CARDIOVERSION N/A 08/31/2015   Procedure: CARDIOVERSION;  Surgeon: Jerline Pain, MD;  Location: [facility] ENDOSCOPY;  Service: Cardiovascular;  Laterality: N/A;  . cyst removed     left breast     Current Outpatient Medications  Medication Sig Dispense Refill  . amiodarone (PACERONE) 200 MG tablet Take 0.5 tablets (100 mg total) by mouth daily. 45 tablet 2  . apixaban (ELIQUIS) 2.5 MG TABS tablet TAKE 1 TABLET(2.5 MG) BY MOUTH TWICE DAILY 180 tablet 2  . Cholecalciferol (VITAMIN D3) 2000 units TABS Take 1 capsule by mouth daily.    . clonazePAM (KLONOPIN) 0.5 MG tablet Take 0.25 mg by mouth 2 (two) times daily as needed for anxiety.    . fluticasone-salmeterol (ADVAIR HFA) 115-21 MCG/ACT inhaler Inhale 2 puffs into the lungs 2 (two) times daily. 12 g 6  . furosemide (LASIX) 40 MG tablet Take 40 mg by mouth daily as needed.    . sertraline (ZOLOFT) 50 MG tablet Take 50 mg by mouth daily.     No current facility-administered medications for this visit.     Allergies:   Augmentin [amoxicillin-pot clavulanate], Biaxin [clarithromycin], Metoprolol, and Tequin [gatifloxacin]   Social History:  The patient  reports that she quit smoking about 50 years ago. Her smoking use included cigarettes. She has a 10.00 pack-year smoking history. She has never used smokeless tobacco. She reports current alcohol use. She reports that she does not use drugs.  Family History:  The patient's family history includes Asthma in her sister; Clotting disorder in her sister; Hypertension in her father and mother; Stroke in her father and mother.   ROS:  Please see the history of present illness.   Otherwise, review of systems is positive for none.   All other systems are reviewed and negative.   PHYSICAL EXAM: VS:  BP 108/68   Pulse 90   Ht 5\' 2"  (1.575 m)   Wt 98 lb (44.5 kg)   SpO2 95%   BMI 17.92 kg/m  , BMI Body mass index is 17.92 kg/m. GEN: Well  nourished, well developed, in no acute distress  HEENT: normal  Neck: no JVD, carotid bruits, or masses Cardiac: RRR; no murmurs, rubs, or gallops,no edema  Respiratory:  clear to auscultation bilaterally, normal work of breathing GI: soft, nontender, nondistended, + BS MS: no deformity or atrophy  Skin: warm and dry Neuro:  Strength and sensation are intact Psych: euthymic mood, full affect  EKG:  EKG is ordered today. Personal review of the ekg ordered shows sinus rhythm, rate 90, anterior Q waves  Recent Labs: No results found for requested labs within last 8760 hours.    Lipid Panel     Component Value Date/Time   CHOL 163 07/03/2015 0440   TRIG 73 07/03/2015 0440   HDL 57 07/03/2015 0440   CHOLHDL 2.9 07/03/2015 0440   VLDL 15 07/03/2015 0440   LDLCALC 91 07/03/2015 0440     Wt Readings from Last 3 Encounters:  03/18/19 98 lb (44.5 kg)  05/15/18 105 lb 9.6 oz (47.9 kg)  03/06/18 104 lb (47.2 kg)      Other studies Reviewed: Additional studies/ records that were reviewed today include: TTE 07/03/15  Review of the above records today demonstrates:  - Left ventricle: The cavity size was normal. Systolic function was   normal. The estimated ejection fraction was in the range of 55%   to 60%. Wall motion was normal; there were no regional wall   motion abnormalities.  Holter 12/13/15  NSR, intermittent junctional rhythm.  PACs, PVCs.  Minimum HR 30 at 4AM, presumably during sleep.  No atrial fibrillation.  Holter 02/28/16 Minimum HR: 17 BPM at 3:44:42 AM Maximum HR: 114 BPM at 9:07:34 AM Average HR: 51 BPM 18.2% PVCs 2.9% APCs Sinus rhytnm Zero atrial fibrillation Occasional junctional rhythm during sleeping hours 4.8 second sinus pause during sleeping hours Chest pounding associated with ventricular bigeminy Longest ventriclar run 12 beats  ASSESSMENT AND PLAN:  1.  Parosysmal atrial fibrillation: Currently on amiodarone and Eliquis.  Has had  bradycardia in the past and thus not on rate controlling medications.  She is having some trouble sleeping.  She is taking her amiodarone in the evening.  I have asked her to take it in the morning as she feels that this is contributing.  No other changes.  Michah Minton check amiodarone labs today.  This patients CHA2DS2-VASc Score and unadjusted Ischemic Stroke Rate (% per year) is equal to 3.2 % stroke rate/year from a score of 3  Above score calculated as 1 point each if present [CHF, HTN, DM, Vascular=MI/PAD/Aortic Plaque, Age if 65-74, or Female] Above score calculated as 2 points each if present [Age > 75, or Stroke/TIA/TE]   2. PVCs: Found to have 18% on cardiac monitor.  Much improved on amiodarone.  Has had some short palpitations that are likely PVCs.  No changes.  Current medicines are reviewed at length with the  patient today.   The patient has concerns regarding her medicines.  The following changes were made today: None  Labs/ tests ordered today include:  Orders Placed This Encounter  Procedures  . Flu Vaccine QUAD High Dose(Fluad)  . TSH  . Hepatic function panel  . EKG 12-Lead     Disposition:   FU with Sebert Stollings 6 months  Signed, Kirbi Farrugia Meredith Leeds, MD  03/18/2019 12:24 PM     [place] [facility] [address] [place] [address] [phone] (office) [phone] (fax)

## 2019-03-18 NOTE — Patient Instructions (Addendum)
Medication Instructions:  Your physician recommends that you continue on your current medications as directed. Please refer to the Current Medication list given to you today.  * If you need a refill on your cardiac medications before your next appointment, please call your pharmacy.   Labwork: Today: BMET & CBC If you have labs (blood work) drawn today and your tests are completely normal, you will receive your results only by:  [place] (if you have MyChart) OR  A paper copy in the mail If you have any lab test that is abnormal or we need to change your treatment, we will call you to review the results.  Testing/Procedures: None ordered  Follow-Up: At [facility], you and your health needs are our priority.  As part of our continuing mission to provide you with exceptional heart care, we have created designated Provider Care Teams.  These Care Teams include your primary Cardiologist (physician) and Advanced Practice Providers (APPs -  Physician Assistants and Nurse Practitioners) who all work together to provide you with the care you need, when you need it.  You will need a follow up appointment in 6 months.  Please call our office 2 months in advance to schedule this appointment.  You may see Dr Curt Bears or one of the following Advanced Practice Providers on your designated Care Team:    Chanetta Marshall, NP  Tommye Standard, PA-C  Oda Kilts, [place]   Thank you for choosing [facility]!!   Trinidad Curet, RN [phone]

## 2019-03-19 LAB — HEPATIC FUNCTION PANEL
ALT: 19 IU/L (ref 0–32)
AST: 28 IU/L (ref 0–40)
Albumin: 3.9 g/dL (ref 3.6–4.6)
Alkaline Phosphatase: 85 IU/L (ref 39–117)
Bilirubin Total: 0.6 mg/dL (ref 0.0–1.2)
Bilirubin, Direct: 0.22 mg/dL (ref 0.00–0.40)
Total Protein: 6.4 g/dL (ref 6.0–8.5)

## 2019-03-19 LAB — TSH: TSH: <0.005 u[IU]/mL — ABNORMAL LOW (ref 0.450–4.500)

## 2019-03-26 ENCOUNTER — Telehealth: Payer: Self-pay | Admitting: Cardiology

## 2019-03-26 NOTE — Telephone Encounter (Signed)
Follow Up:     Daughter said Primary Doctor(Dr Inda Merlin) had not received pt's thyroid results, all the other results were received.. Please fax them to Dr Inda Merlin office.

## 2019-03-26 NOTE — Telephone Encounter (Signed)
Left message informing dtr that I "routed" via epic to PCP (which will fax it to PCP).  Asked her to call back next week if still not received.

## 2019-03-27 DIAGNOSIS — R946 Abnormal results of thyroid function studies: Secondary | ICD-10-CM | POA: Diagnosis not present

## 2019-04-03 ENCOUNTER — Other Ambulatory Visit: Payer: Self-pay | Admitting: Internal Medicine

## 2019-04-03 ENCOUNTER — Ambulatory Visit
Admission: RE | Admit: 2019-04-03 | Discharge: 2019-04-03 | Disposition: A | Discharge status: Home / Self Care | Payer: Medicare Other | Source: Ambulatory Visit | Attending: Internal Medicine | Admitting: Internal Medicine

## 2019-04-03 DIAGNOSIS — J479 Bronchiectasis, uncomplicated: Secondary | ICD-10-CM | POA: Diagnosis not present

## 2019-04-03 DIAGNOSIS — E0580 Other thyrotoxicosis without thyrotoxic crisis or storm: Secondary | ICD-10-CM | POA: Diagnosis not present

## 2019-04-03 DIAGNOSIS — J439 Emphysema, unspecified: Secondary | ICD-10-CM

## 2019-04-03 DIAGNOSIS — I4891 Unspecified atrial fibrillation: Secondary | ICD-10-CM | POA: Diagnosis not present

## 2019-04-03 DIAGNOSIS — R0602 Shortness of breath: Secondary | ICD-10-CM | POA: Diagnosis not present

## 2019-04-03 DIAGNOSIS — R634 Abnormal weight loss: Secondary | ICD-10-CM | POA: Diagnosis not present

## 2019-04-17 DIAGNOSIS — E0580 Other thyrotoxicosis without thyrotoxic crisis or storm: Secondary | ICD-10-CM | POA: Diagnosis not present

## 2019-04-17 DIAGNOSIS — R634 Abnormal weight loss: Secondary | ICD-10-CM | POA: Diagnosis not present

## 2019-04-26 ENCOUNTER — Other Ambulatory Visit: Payer: Self-pay | Admitting: Cardiology

## 2019-04-28 ENCOUNTER — Other Ambulatory Visit: Payer: Self-pay

## 2019-04-28 MED ORDER — AMIODARONE HCL 200 MG PO TABS: 100.0000 mg | ORAL_TABLET | Freq: Every day | ORAL | 2 refills | Status: DC

## 2019-04-29 NOTE — Telephone Encounter (Signed)
Outpatient Medication Detail   Disp Refills Start End   amiodarone (PACERONE) 200 MG tablet 45 tablet 2 04/28/2019    Sig - Route: Take 0.5 tablets (100 mg total) by mouth daily. - Oral   Sent to pharmacy as: amiodarone (PACERONE) 200 MG tablet   E-Prescribing Status: Receipt confirmed by pharmacy (04/28/2019 4:12 PM EST)   [id number] - [place], [place]

## 2019-05-13 ENCOUNTER — Ambulatory Visit: Payer: Medicare Other | Admitting: Pulmonary Disease

## 2019-06-17 DIAGNOSIS — I10 Essential (primary) hypertension: Secondary | ICD-10-CM | POA: Diagnosis not present

## 2019-06-17 DIAGNOSIS — I4891 Unspecified atrial fibrillation: Secondary | ICD-10-CM | POA: Diagnosis not present

## 2019-06-17 DIAGNOSIS — J439 Emphysema, unspecified: Secondary | ICD-10-CM | POA: Diagnosis not present

## 2019-06-17 DIAGNOSIS — J479 Bronchiectasis, uncomplicated: Secondary | ICD-10-CM | POA: Diagnosis not present

## 2019-06-17 DIAGNOSIS — E782 Mixed hyperlipidemia: Secondary | ICD-10-CM | POA: Diagnosis not present

## 2019-06-17 DIAGNOSIS — M1990 Unspecified osteoarthritis, unspecified site: Secondary | ICD-10-CM | POA: Diagnosis not present

## 2019-06-21 ENCOUNTER — Ambulatory Visit: Payer: Medicare Other | Attending: Internal Medicine

## 2019-06-21 DIAGNOSIS — Z23 Encounter for immunization: Secondary | ICD-10-CM

## 2019-06-21 NOTE — Progress Notes (Signed)
   Covid-19 Vaccination Clinic  Name:  [name]    MRN: [id number] DOB: 1931/03/08  06/21/2019  [name] was observed post Covid-19 immunization for 15 minutes without incidence. She was provided with Vaccine Information Sheet and instruction to access the V-Safe system.   [name] was instructed to call 911 with any severe reactions post vaccine: Marland Kitchen Difficulty breathing  . Swelling of your face and throat  . A fast heartbeat  . A bad rash all over your body  . Dizziness and weakness    Immunizations Administered    Name Date Dose VIS Date Route   Pfizer COVID-19 Vaccine 06/21/2019  1:26 PM 0.3 mL 05/09/2019 Intramuscular   Manufacturer: [place]   Lot: [id number]   [place]: [id number]

## 2019-06-26 DIAGNOSIS — I4891 Unspecified atrial fibrillation: Secondary | ICD-10-CM | POA: Diagnosis not present

## 2019-06-26 DIAGNOSIS — D6869 Other thrombophilia: Secondary | ICD-10-CM | POA: Diagnosis not present

## 2019-06-26 DIAGNOSIS — R634 Abnormal weight loss: Secondary | ICD-10-CM | POA: Diagnosis not present

## 2019-06-26 DIAGNOSIS — E559 Vitamin D deficiency, unspecified: Secondary | ICD-10-CM | POA: Diagnosis not present

## 2019-06-26 DIAGNOSIS — R197 Diarrhea, unspecified: Secondary | ICD-10-CM | POA: Diagnosis not present

## 2019-06-26 DIAGNOSIS — J479 Bronchiectasis, uncomplicated: Secondary | ICD-10-CM | POA: Diagnosis not present

## 2019-06-26 DIAGNOSIS — E0580 Other thyrotoxicosis without thyrotoxic crisis or storm: Secondary | ICD-10-CM | POA: Diagnosis not present

## 2019-06-26 DIAGNOSIS — J439 Emphysema, unspecified: Secondary | ICD-10-CM | POA: Diagnosis not present

## 2019-07-12 ENCOUNTER — Ambulatory Visit: Payer: Medicare Other | Attending: Internal Medicine

## 2019-07-12 DIAGNOSIS — Z23 Encounter for immunization: Secondary | ICD-10-CM

## 2019-07-12 NOTE — Progress Notes (Signed)
   Covid-19 Vaccination Clinic  Name:  CHANNAH BUDZINSKI    MRN: [id number] DOB: 1930-09-06  07/12/2019  Ms. Spilker was observed post Covid-19 immunization for 15 minutes without incidence. She was provided with Vaccine Information Sheet and instruction to access the V-Safe system.   Ms. Kilton was instructed to call 911 with any severe reactions post vaccine: Marland Kitchen Difficulty breathing  . Swelling of your face and throat  . A fast heartbeat  . A bad rash all over your body  . Dizziness and weakness    Immunizations Administered    Name Date Dose VIS Date Route   Pfizer COVID-19 Vaccine 07/12/2019 12:31 PM 0.3 mL 05/09/2019 Intramuscular   Manufacturer: [place]   Lot: [id number]   [place]: [id number]

## 2019-07-16 DIAGNOSIS — E782 Mixed hyperlipidemia: Secondary | ICD-10-CM | POA: Diagnosis not present

## 2019-07-16 DIAGNOSIS — J439 Emphysema, unspecified: Secondary | ICD-10-CM | POA: Diagnosis not present

## 2019-07-16 DIAGNOSIS — M1990 Unspecified osteoarthritis, unspecified site: Secondary | ICD-10-CM | POA: Diagnosis not present

## 2019-07-16 DIAGNOSIS — I4891 Unspecified atrial fibrillation: Secondary | ICD-10-CM | POA: Diagnosis not present

## 2019-07-16 DIAGNOSIS — J479 Bronchiectasis, uncomplicated: Secondary | ICD-10-CM | POA: Diagnosis not present

## 2019-07-16 DIAGNOSIS — I10 Essential (primary) hypertension: Secondary | ICD-10-CM | POA: Diagnosis not present

## 2019-08-11 DIAGNOSIS — M1990 Unspecified osteoarthritis, unspecified site: Secondary | ICD-10-CM | POA: Diagnosis not present

## 2019-08-11 DIAGNOSIS — E782 Mixed hyperlipidemia: Secondary | ICD-10-CM | POA: Diagnosis not present

## 2019-08-11 DIAGNOSIS — I10 Essential (primary) hypertension: Secondary | ICD-10-CM | POA: Diagnosis not present

## 2019-08-11 DIAGNOSIS — J439 Emphysema, unspecified: Secondary | ICD-10-CM | POA: Diagnosis not present

## 2019-08-11 DIAGNOSIS — J479 Bronchiectasis, uncomplicated: Secondary | ICD-10-CM | POA: Diagnosis not present

## 2019-08-11 DIAGNOSIS — I4891 Unspecified atrial fibrillation: Secondary | ICD-10-CM | POA: Diagnosis not present

## 2019-08-25 DIAGNOSIS — H25813 Combined forms of age-related cataract, bilateral: Secondary | ICD-10-CM | POA: Diagnosis not present

## 2019-08-25 DIAGNOSIS — H524 Presbyopia: Secondary | ICD-10-CM | POA: Diagnosis not present

## 2019-08-25 DIAGNOSIS — H52203 Unspecified astigmatism, bilateral: Secondary | ICD-10-CM | POA: Diagnosis not present

## 2019-08-25 DIAGNOSIS — H5203 Hypermetropia, bilateral: Secondary | ICD-10-CM | POA: Diagnosis not present

## 2019-09-20 DIAGNOSIS — J439 Emphysema, unspecified: Secondary | ICD-10-CM | POA: Diagnosis not present

## 2019-09-20 DIAGNOSIS — M1990 Unspecified osteoarthritis, unspecified site: Secondary | ICD-10-CM | POA: Diagnosis not present

## 2019-09-20 DIAGNOSIS — I4891 Unspecified atrial fibrillation: Secondary | ICD-10-CM | POA: Diagnosis not present

## 2019-09-20 DIAGNOSIS — I10 Essential (primary) hypertension: Secondary | ICD-10-CM | POA: Diagnosis not present

## 2019-09-20 DIAGNOSIS — J479 Bronchiectasis, uncomplicated: Secondary | ICD-10-CM | POA: Diagnosis not present

## 2019-09-20 DIAGNOSIS — E782 Mixed hyperlipidemia: Secondary | ICD-10-CM | POA: Diagnosis not present

## 2019-09-25 ENCOUNTER — Other Ambulatory Visit: Payer: Self-pay

## 2019-09-25 ENCOUNTER — Encounter: Payer: Self-pay | Admitting: Cardiology

## 2019-09-25 ENCOUNTER — Ambulatory Visit (INDEPENDENT_AMBULATORY_CARE_PROVIDER_SITE_OTHER): Payer: Medicare Other | Admitting: Cardiology

## 2019-09-25 ENCOUNTER — Telehealth: Payer: Self-pay | Admitting: Cardiology

## 2019-09-25 VITALS — BP 126/56 | HR 61 | Ht 62.0 in | Wt 107.0 lb

## 2019-09-25 DIAGNOSIS — I480 Paroxysmal atrial fibrillation: Secondary | ICD-10-CM | POA: Diagnosis not present

## 2019-09-25 DIAGNOSIS — Z79899 Other long term (current) drug therapy: Secondary | ICD-10-CM | POA: Diagnosis not present

## 2019-09-25 NOTE — Addendum Note (Signed)
Addended by: Stanton Kidney on: 09/25/2019 02:40 PM   Modules accepted: Orders

## 2019-09-25 NOTE — Patient Instructions (Signed)
Medication Instructions:  Your physician recommends that you continue on your current medications as directed. Please refer to the Current Medication list given to you today.  *If you need a refill on your cardiac medications before your next appointment, please call your pharmacy*   Lab Work: None ordered If you have labs (blood work) drawn today and your tests are completely normal, you will receive your results only by: Marland Kitchen MyChart Message (if you have MyChart) OR . A paper copy in the mail If you have any lab test that is abnormal or we need to change your treatment, we will call you to review the results.   Testing/Procedures: None ordered   Follow-Up: At [facility], you and your health needs are our priority.  As part of our continuing mission to provide you with exceptional heart care, we have created designated Provider Care Teams.  These Care Teams include your primary Cardiologist (physician) and Advanced Practice Providers (APPs -  Physician Assistants and Nurse Practitioners) who all work together to provide you with the care you need, when you need it.  We recommend signing up for the patient portal called "MyChart".  Sign up information is provided on this After Visit Summary.  MyChart is used to connect with patients for Virtual Visits (Telemedicine).  Patients are able to view lab/test results, encounter notes, upcoming appointments, etc.  Non-urgent messages can be sent to your provider as well.   To learn more about what you can do with MyChart, go to [email or web address].    Your next appointment:   6 month(s)  The format for your next appointment:   In Person  Provider:   You may see Will Meredith Leeds, MD or one of the following Advanced Practice Providers on your designated Care Team:    Chanetta Marshall, NP  Tommye Standard, PA-C  Legrand [place] "[place]" [place], [place]    Thank you for choosing [facility]!!   Trinidad Curet, RN [phone]    Other  Instructions

## 2019-09-25 NOTE — Progress Notes (Signed)
Electrophysiology Office Note   Date:  09/25/2019   ID:  [name], DOB [birth year], MRN [id number]  PCP:  Josetta Huddle, MD  Cardiologist:  Christ Kick Primary Electrophysiologist:  Syndi Pua Meredith Leeds, MD    No chief complaint on file.    History of Present Illness: [name] is a 86 y.o. female who presents today for electrophysiology evaluation.   History of paroxysmal atrial fibrillation and elevated CHA2DS2 VASc score of 3, on chronic anticoagulation with Eliquis. Had cardioversion 08/31/15 which was unsuccessful.  Flecainide was increased to 100 mg BID and she had attempted repeat cardioversion but was in sinus rhythm when she showed up to the hospital. Wore a holter monitor showing 18% PVCs but no atrial fibrillation with episodes of junctional rhythm and HR in the 20s during sleeping hours.   Today, denies symptoms of palpitations, chest pain, shortness of breath, orthopnea, PND, lower extremity edema, claudication, dizziness, presyncope, syncope, bleeding, or neurologic sequela. The patient is tolerating medications without difficulties.  Overall she is doing well.  She has no chest pain or shortness of breath.  Is able to do all of her daily activities.  She is sleeping better now that she is taking melatonin.  She has not noted any atrial fibrillation or PVCs.   Past Medical History:  Diagnosis Date  . Bronchiectasis ([place])    with multiple pneumonia back in 1990's  . Bronchitis    episodic  . DJD (degenerative joint disease) of knee   . GERD (gastroesophageal reflux disease)    episodic symptoms  . Hearing loss    chronic moderate hearing loss, worked up by ENT and hearing aids have been recommended  . History of nuclear stress test 11/07   NORMAL  . Insomnia   . PVC's (premature ventricular contractions)    on EKG, controlled on atenolol and caffeine reduction  . Tinnitus    chronic   Past Surgical History:  Procedure Laterality Date  . BREAST EXCISIONAL  BIOPSY Left   . CARDIOVERSION N/A 08/31/2015   Procedure: CARDIOVERSION;  Surgeon: Jerline Pain, MD;  Location: [facility] ENDOSCOPY;  Service: Cardiovascular;  Laterality: N/A;  . cyst removed     left breast     Current Outpatient Medications  Medication Sig Dispense Refill  . amiodarone (PACERONE) 200 MG tablet Take 0.5 tablets (100 mg total) by mouth daily. 45 tablet 2  . apixaban (ELIQUIS) 2.5 MG TABS tablet TAKE 1 TABLET(2.5 MG) BY MOUTH TWICE DAILY 180 tablet 2  . Cholecalciferol (VITAMIN D3) 2000 units TABS Take 1 capsule by mouth daily.    . clonazePAM (KLONOPIN) 0.5 MG tablet Take 0.25 mg by mouth 2 (two) times daily as needed for anxiety.    . fluticasone-salmeterol (ADVAIR HFA) 115-21 MCG/ACT inhaler Inhale 2 puffs into the lungs 2 (two) times daily. 12 g 6  . furosemide (LASIX) 40 MG tablet Take 40 mg by mouth daily as needed.    . sertraline (ZOLOFT) 50 MG tablet Take 50 mg by mouth daily.     No current facility-administered medications for this visit.    Allergies:   Augmentin [amoxicillin-pot clavulanate], Biaxin [clarithromycin], Metoprolol, and Tequin [gatifloxacin]   Social History:  The patient  reports that she quit smoking about 51 years ago. Her smoking use included cigarettes. She has a 10.00 pack-year smoking history. She has never used smokeless tobacco. She reports current alcohol use. She reports that she does not use drugs.   Family History:  The patient's  family history includes Asthma in her sister; Clotting disorder in her sister; Hypertension in her father and mother; Stroke in her father and mother.   ROS:  Please see the history of present illness.   Otherwise, review of systems is positive for none.   All other systems are reviewed and negative.   PHYSICAL EXAM: VS:  BP (!) 126/56   Pulse 61   Ht 5\' 2"  (1.575 m)   Wt 107 lb (48.5 kg)   BMI 19.57 kg/m  , BMI Body mass index is 19.57 kg/m. GEN: Well nourished, well developed, in no acute distress    HEENT: normal  Neck: no JVD, carotid bruits, or masses Cardiac: RRR; no murmurs, rubs, or gallops,no edema  Respiratory:  clear to auscultation bilaterally, normal work of breathing GI: soft, nontender, nondistended, + BS MS: no deformity or atrophy  Skin: warm and dry Neuro:  Strength and sensation are intact Psych: euthymic mood, full affect  EKG:  EKG is ordered today. Personal review of the ekg ordered shows sinus rhythm, rate 61  Recent Labs: 03/18/2019: ALT 19; TSH <0.005    Lipid Panel     Component Value Date/Time   CHOL 163 07/03/2015 0440   TRIG 73 07/03/2015 0440   HDL 57 07/03/2015 0440   CHOLHDL 2.9 07/03/2015 0440   VLDL 15 07/03/2015 0440   LDLCALC 91 07/03/2015 0440     Wt Readings from Last 3 Encounters:  09/25/19 107 lb (48.5 kg)  03/18/19 98 lb (44.5 kg)  05/15/18 105 lb 9.6 oz (47.9 kg)      Other studies Reviewed: Additional studies/ records that were reviewed today include: TTE 07/03/15  Review of the above records today demonstrates:  - Left ventricle: The cavity size was normal. Systolic function was   normal. The estimated ejection fraction was in the range of 55%   to 60%. Wall motion was normal; there were no regional wall   motion abnormalities.  Holter 12/13/15  NSR, intermittent junctional rhythm.  PACs, PVCs.  Minimum HR 30 at 4AM, presumably during sleep.  No atrial fibrillation.  Holter 02/28/16 Minimum HR: 17 BPM at 3:44:42 AM Maximum HR: 114 BPM at 9:07:34 AM Average HR: 51 BPM 18.2% PVCs 2.9% APCs Sinus rhytnm Zero atrial fibrillation Occasional junctional rhythm during sleeping hours 4.8 second sinus pause during sleeping hours Chest pounding associated with ventricular bigeminy Longest ventriclar run 12 beats  ASSESSMENT AND PLAN:  1.  Parosysmal atrial fibrillation: Currently on amiodarone and Eliquis.  CHA2DS2-VASc 3.  Check amiodarone labs today.  Fortunately he remains in sinus rhythm.   2. PVCs: Found on  cardiac monitor.  Much improved on amiodarone.  None noted on ECG today.  She currently feels well.  No changes.  Current medicines are reviewed at length with the patient today.   The patient has concerns regarding her medicines.  The following changes were made today: None  Labs/ tests ordered today include:  Orders Placed This Encounter  Procedures  . EKG 12-Lead     Disposition:   FU with Beverly Suriano 6 months  Signed, Vernice Bowker Meredith Leeds, MD  09/25/2019 2:38 PM     [facility] [address] [place] [address] [phone] (office) [phone] (fax)

## 2019-09-25 NOTE — Telephone Encounter (Signed)
New Message   Pts daughter is calling and says she is needing to assist the pt to her appt today because she is hard of hearing    Please advise

## 2019-09-25 NOTE — Telephone Encounter (Signed)
Aware ok to accompany pt to appt. dtr appreciates our understanding.

## 2019-09-26 LAB — HEPATIC FUNCTION PANEL
ALT: 12 IU/L (ref 0–32)
AST: 20 IU/L (ref 0–40)
Albumin: 4.1 g/dL (ref 3.6–4.6)
Alkaline Phosphatase: 91 IU/L (ref 39–117)
Bilirubin Total: 0.4 mg/dL (ref 0.0–1.2)
Bilirubin, Direct: 0.15 mg/dL (ref 0.00–0.40)
Total Protein: 6.4 g/dL (ref 6.0–8.5)

## 2019-09-26 LAB — TSH: TSH: 4.05 u[IU]/mL (ref 0.450–4.500)

## 2019-10-20 DIAGNOSIS — J439 Emphysema, unspecified: Secondary | ICD-10-CM | POA: Diagnosis not present

## 2019-10-20 DIAGNOSIS — M1990 Unspecified osteoarthritis, unspecified site: Secondary | ICD-10-CM | POA: Diagnosis not present

## 2019-10-20 DIAGNOSIS — J479 Bronchiectasis, uncomplicated: Secondary | ICD-10-CM | POA: Diagnosis not present

## 2019-10-20 DIAGNOSIS — I10 Essential (primary) hypertension: Secondary | ICD-10-CM | POA: Diagnosis not present

## 2019-10-20 DIAGNOSIS — I4891 Unspecified atrial fibrillation: Secondary | ICD-10-CM | POA: Diagnosis not present

## 2019-10-20 DIAGNOSIS — E782 Mixed hyperlipidemia: Secondary | ICD-10-CM | POA: Diagnosis not present

## 2019-12-11 ENCOUNTER — Telehealth: Payer: Self-pay | Admitting: Pulmonary Disease

## 2019-12-11 NOTE — Telephone Encounter (Signed)
LMTCB for the pt 

## 2019-12-11 NOTE — Telephone Encounter (Signed)
Ok to switch to wixela or symbicort which every is cheaper for her [facility] 12/11/2019 2:25 PM

## 2019-12-11 NOTE — Telephone Encounter (Signed)
Spoke with [name] and [place] and she states that the advair hfa is too expensive  Wixela and generic symbicort are cheaper options  Pt is a former SN pt who has appt pending with Dr Valeta Harms for 01/01/20  This is just FYI for that appt

## 2019-12-12 DIAGNOSIS — J479 Bronchiectasis, uncomplicated: Secondary | ICD-10-CM | POA: Diagnosis not present

## 2019-12-12 DIAGNOSIS — M1990 Unspecified osteoarthritis, unspecified site: Secondary | ICD-10-CM | POA: Diagnosis not present

## 2019-12-12 DIAGNOSIS — J439 Emphysema, unspecified: Secondary | ICD-10-CM | POA: Diagnosis not present

## 2019-12-12 DIAGNOSIS — E782 Mixed hyperlipidemia: Secondary | ICD-10-CM | POA: Diagnosis not present

## 2019-12-12 DIAGNOSIS — I10 Essential (primary) hypertension: Secondary | ICD-10-CM | POA: Diagnosis not present

## 2019-12-12 DIAGNOSIS — I4891 Unspecified atrial fibrillation: Secondary | ICD-10-CM | POA: Diagnosis not present

## 2019-12-12 NOTE — Telephone Encounter (Signed)
LMTCB x2 for pt 

## 2019-12-15 NOTE — Telephone Encounter (Signed)
LMTCB x 3 and will close per protocol 

## 2020-01-01 ENCOUNTER — Ambulatory Visit (INDEPENDENT_AMBULATORY_CARE_PROVIDER_SITE_OTHER): Payer: Medicare Other | Admitting: Pulmonary Disease

## 2020-01-01 ENCOUNTER — Encounter: Payer: Self-pay | Admitting: Pulmonary Disease

## 2020-01-01 ENCOUNTER — Other Ambulatory Visit: Payer: Self-pay

## 2020-01-01 VITALS — BP 110/64 | HR 46 | Temp 98.3°F | Ht 62.5 in | Wt 110.8 lb

## 2020-01-01 DIAGNOSIS — J449 Chronic obstructive pulmonary disease, unspecified: Secondary | ICD-10-CM

## 2020-01-01 DIAGNOSIS — J479 Bronchiectasis, uncomplicated: Secondary | ICD-10-CM

## 2020-01-01 MED ORDER — STIOLTO RESPIMAT 2.5-2.5 MCG/ACT IN AERS: 2.0000 | INHALATION_SPRAY | Freq: Every day | RESPIRATORY_TRACT | 0 refills | Status: DC

## 2020-01-01 NOTE — Progress Notes (Signed)
Synopsis: Referred in August 2021 for establish care with new primary pulmonologist, former patient Dr. Lenna Gilford by Josetta Huddle, MD  Subjective:   PATIENT ID: [name] GENDER: female DOB: 12/10/1930, MRN: [id number]  Chief Complaint  Patient presents with  . Consult    non productive cough    Is an 86 year old female with medical history of bronchiectasis and recurrent pneumonia in the 90s.  She has upper lobe and right middle lobe bronchiectasis on CT scan in 2016.  Also had prior cultures positive for Pseudomonas.  Has been followed for treatment of bronchiectasis by Dr. Lenna Gilford for several years.  Currently managed with Advair.  She was supposed to be seen 6 months into the Covid pandemic and stopped coming as she was doing really well.  So here now for establish care.  Patient does have occasional sputum production but otherwise her respiratory symptoms have been stable for the past year.   Past Medical History:  Diagnosis Date  . Bronchiectasis ([place])    with multiple pneumonia back in 1990's  . Bronchitis    episodic  . DJD (degenerative joint disease) of knee   . GERD (gastroesophageal reflux disease)    episodic symptoms  . Hearing loss    chronic moderate hearing loss, worked up by ENT and hearing aids have been recommended  . History of nuclear stress test 11/07   NORMAL  . Insomnia   . PVC's (premature ventricular contractions)    on EKG, controlled on atenolol and caffeine reduction  . Tinnitus    chronic     Family History  Problem Relation Age of Onset  . Stroke Father   . Hypertension Father   . Stroke Mother   . Hypertension Mother   . Asthma Sister   . Clotting disorder Sister   . Heart attack Neg Hx      Past Surgical History:  Procedure Laterality Date  . BREAST EXCISIONAL BIOPSY Left   . CARDIOVERSION N/A 08/31/2015   Procedure: CARDIOVERSION;  Surgeon: Jerline Pain, MD;  Location: [facility] ENDOSCOPY;  Service: Cardiovascular;  Laterality: N/A;  .  cyst removed     left breast    Social History   Socioeconomic History  . Marital status: Divorced    Spouse name: Not on file  . Number of children: Not on file  . Years of education: Not on file  . Highest education level: Not on file  Occupational History  . Occupation: retired  Tobacco Use  . Smoking status: Former Smoker    Packs/day: 0.50    Years: 20.00    Pack years: 10.00    Types: Cigarettes    Quit date: 05/29/1968    Years since quitting: 51.6  . Smokeless tobacco: Never Used  Vaping Use  . Vaping Use: Never used  Substance and Sexual Activity  . Alcohol use: Yes    Alcohol/week: 0.0 standard drinks    Comment: 1 can of beer at night  . Drug use: No  . Sexual activity: Not on file  Other Topics Concern  . Not on file  Social History Narrative  . Not on file   Social Determinants of Health   Financial Resource Strain:   . Difficulty of Paying Living Expenses:   Food Insecurity:   . Worried About Charity fundraiser in the Last Year:   . Arboriculturist in the Last Year:   Transportation Needs:   . Film/video editor (Medical):   Marland Kitchen  Lack of Transportation (Non-Medical):   Physical Activity:   . Days of Exercise per Week:   . Minutes of Exercise per Session:   Stress:   . Feeling of Stress :   Social Connections:   . Frequency of Communication with Friends and Family:   . Frequency of Social Gatherings with Friends and Family:   . Attends Religious Services:   . Active Member of Clubs or Organizations:   . Attends Archivist Meetings:   Marland Kitchen Marital Status:   Intimate Partner Violence:   . Fear of Current or Ex-Partner:   . Emotionally Abused:   Marland Kitchen Physically Abused:   . Sexually Abused:      Allergies  Allergen Reactions  . Augmentin [Amoxicillin-Pot Clavulanate] Nausea And Vomiting  . Biaxin [Clarithromycin] Nausea And Vomiting  . Metoprolol Other (See Comments)    Patient notes she "feels funny" after taking it  . Tequin  [Gatifloxacin] Nausea Only     Outpatient Medications Prior to Visit  Medication Sig Dispense Refill  . amiodarone (PACERONE) 200 MG tablet Take 0.5 tablets (100 mg total) by mouth daily. 45 tablet 2  . apixaban (ELIQUIS) 2.5 MG TABS tablet TAKE 1 TABLET(2.5 MG) BY MOUTH TWICE DAILY 180 tablet 2  . Cholecalciferol (VITAMIN D3) 2000 units TABS Take 1 capsule by mouth daily.    . fluticasone-salmeterol (ADVAIR HFA) 115-21 MCG/ACT inhaler Inhale 2 puffs into the lungs 2 (two) times daily. 12 g 6  . furosemide (LASIX) 40 MG tablet Take 40 mg by mouth daily as needed.    . Melatonin 10 MG TABS Take by mouth.    . sertraline (ZOLOFT) 50 MG tablet Take 50 mg by mouth daily.    . clonazePAM (KLONOPIN) 0.5 MG tablet Take 0.25 mg by mouth 2 (two) times daily as needed for anxiety. (Patient not taking: Reported on 01/01/2020)     No facility-administered medications prior to visit.    Review of Systems  Constitutional: Negative for chills, fever, malaise/fatigue and weight loss.  HENT: Negative for hearing loss, sore throat and tinnitus.   Eyes: Negative for blurred vision and double vision.  Respiratory: Positive for cough, sputum production and shortness of breath. Negative for hemoptysis, wheezing and stridor.   Cardiovascular: Negative for chest pain, palpitations, orthopnea, leg swelling and PND.  Gastrointestinal: Negative for abdominal pain, constipation, diarrhea, heartburn, nausea and vomiting.  Genitourinary: Negative for dysuria, hematuria and urgency.  Musculoskeletal: Negative for joint pain and myalgias.  Skin: Negative for itching and rash.  Neurological: Negative for dizziness, tingling, weakness and headaches.  Endo/Heme/Allergies: Negative for environmental allergies. Does not bruise/bleed easily.  Psychiatric/Behavioral: Negative for depression. The patient is not nervous/anxious and does not have insomnia.   All other systems reviewed and are negative.    Objective:   Physical Exam Vitals reviewed.  Constitutional:      General: She is not in acute distress.    Appearance: She is well-developed.     Comments: Thin  HENT:     Head: Normocephalic and atraumatic.  Eyes:     General: No scleral icterus.    Conjunctiva/sclera: Conjunctivae normal.     Pupils: Pupils are equal, round, and reactive to light.  Neck:     Vascular: No JVD.     Trachea: No tracheal deviation.  Cardiovascular:     Rate and Rhythm: Normal rate and regular rhythm.     Heart sounds: Normal heart sounds. No murmur heard.   Pulmonary:  Effort: Pulmonary effort is normal. No tachypnea, accessory muscle usage or respiratory distress.     Breath sounds: No stridor. No wheezing, rhonchi or rales.  Abdominal:     General: Bowel sounds are normal. There is no distension.     Palpations: Abdomen is soft.     Tenderness: There is no abdominal tenderness.  Musculoskeletal:        General: No tenderness.     Cervical back: Neck supple.  Lymphadenopathy:     Cervical: No cervical adenopathy.  Skin:    General: Skin is warm and dry.     Capillary Refill: Capillary refill takes less than 2 seconds.     Findings: No rash.  Neurological:     Mental Status: She is alert and oriented to person, place, and time.  Psychiatric:        Behavior: Behavior normal.      Vitals:   01/01/20 1538  BP: 110/64  Pulse: (!) 46  Temp: 98.3 F (36.8 C)  TempSrc: Oral  SpO2: 97%  Weight: 110 lb 12.8 oz (50.3 kg)  Height: 5' 2.5" (1.588 m)   97% on RA BMI Readings from Last 3 Encounters:  01/01/20 19.94 kg/m  09/25/19 19.57 kg/m  03/18/19 17.92 kg/m   Wt Readings from Last 3 Encounters:  01/01/20 110 lb 12.8 oz (50.3 kg)  09/25/19 107 lb (48.5 kg)  03/18/19 98 lb (44.5 kg)     CBC    Component Value Date/Time   WBC 4.8 09/07/2017 1501   WBC 4.9 02/23/2016 1427   RBC 4.27 09/07/2017 1501   RBC 4.58 02/23/2016 1427   HGB 13.4 09/07/2017 1501   HCT 38.6 09/07/2017 1501    PLT 182 09/07/2017 1501   MCV 90 09/07/2017 1501   MCH 31.4 09/07/2017 1501   MCH 29.7 02/23/2016 1427   MCHC 34.7 09/07/2017 1501   MCHC 33.9 02/23/2016 1427   RDW 14.7 09/07/2017 1501   LYMPHSABS 1.4 09/07/2017 1501   MONOABS 567 01/13/2016 1302   EOSABS 0.1 09/07/2017 1501   BASOSABS 0.0 09/07/2017 1501    Chest Imaging: CT chest 2016: Bilateral bronchiectasis The patient's images have been independently reviewed by me.    Pulmonary Functions Testing Results: PFT Results Latest Ref Rng & Units 11/10/2014  FVC-Pre L 1.80  FVC-Predicted Pre % 78  FVC-Post L 1.91  FVC-Predicted Post % 83  Pre FEV1/FVC % % 50  Post FEV1/FCV % % 57  FEV1-Pre L 0.90  FEV1-Predicted Pre % 53  FEV1-Post L 1.08  DLCO uncorrected ml/min/mmHg 9.33  DLCO UNC% % 43  DLVA Predicted % 91  TLC L 4.66  TLC % Predicted % 98  RV % Predicted % 117    FeNO:   Pathology:   Echocardiogram:   Heart Catheterization:     Assessment & Plan:     ICD-10-CM   1. Bronchiectasis without complication ([place])  K56.2   2. COPD mixed type ([place])  J44.9     Discussion:  This is an 86 year old female with bronchiectasis.  Prior culture with Pseudomonas.  No recent cultures.  Prior AFB negative.  Current symptoms are stable.  She has been managed on ICS/LABA for a long time.  She has mild obstructive disease related to this.  She likely has NTM but this has not been cultured out in the past.  Imaging consistent with this.  Plan: Switch from Advair to [place]. Patient was instructed on how to use the new Stiolto inhaler. Would  like to avoid ICS with bronchiectasis. If patient has change in respiratory symptoms would recommend cultures of sputum at that time.  Greater than 50% of this patient's 35-minute office was in face-to-face discussing the recommendations and treatment plan as well as review of previous medical records from prior pulmonology office visit.  We also reviewed patient's old CT imaging in the  office today.   Current Outpatient Medications:  .  amiodarone (PACERONE) 200 MG tablet, Take 0.5 tablets (100 mg total) by mouth daily., Disp: 45 tablet, Rfl: 2 .  apixaban (ELIQUIS) 2.5 MG TABS tablet, TAKE 1 TABLET(2.5 MG) BY MOUTH TWICE DAILY, Disp: 180 tablet, Rfl: 2 .  Cholecalciferol (VITAMIN D3) 2000 units TABS, Take 1 capsule by mouth daily., Disp: , Rfl:  .  fluticasone-salmeterol (ADVAIR HFA) 115-21 MCG/ACT inhaler, Inhale 2 puffs into the lungs 2 (two) times daily., Disp: 12 g, Rfl: 6 .  furosemide (LASIX) 40 MG tablet, Take 40 mg by mouth daily as needed., Disp: , Rfl:  .  Melatonin 10 MG TABS, Take by mouth., Disp: , Rfl:  .  sertraline (ZOLOFT) 50 MG tablet, Take 50 mg by mouth daily., Disp: , Rfl:  .  clonazePAM (KLONOPIN) 0.5 MG tablet, Take 0.25 mg by mouth 2 (two) times daily as needed for anxiety. (Patient not taking: Reported on 01/01/2020), Disp: , Rfl:    Garner Nash, DO [facility] 01/01/2020 3:45 PM

## 2020-01-01 NOTE — Patient Instructions (Signed)
Thank you for visiting Dr. Valeta Harms at [facility]. Today we recommend the following:  Stop Advair HFA  Start Stiolto 2 puffs once daily Samples of stiolto and inhaler education use today   Return in about 6 months (around 07/03/2020) for with APP or Dr. Valeta Harms.    Please do your part to reduce the spread of COVID-19.

## 2020-01-13 ENCOUNTER — Other Ambulatory Visit: Payer: Self-pay | Admitting: Cardiology

## 2020-01-20 DIAGNOSIS — J479 Bronchiectasis, uncomplicated: Secondary | ICD-10-CM | POA: Diagnosis not present

## 2020-01-20 DIAGNOSIS — J439 Emphysema, unspecified: Secondary | ICD-10-CM | POA: Diagnosis not present

## 2020-01-20 DIAGNOSIS — I4891 Unspecified atrial fibrillation: Secondary | ICD-10-CM | POA: Diagnosis not present

## 2020-01-20 DIAGNOSIS — I10 Essential (primary) hypertension: Secondary | ICD-10-CM | POA: Diagnosis not present

## 2020-01-20 DIAGNOSIS — E782 Mixed hyperlipidemia: Secondary | ICD-10-CM | POA: Diagnosis not present

## 2020-01-20 DIAGNOSIS — M1990 Unspecified osteoarthritis, unspecified site: Secondary | ICD-10-CM | POA: Diagnosis not present

## 2020-02-09 ENCOUNTER — Telehealth: Payer: Self-pay | Admitting: Pulmonary Disease

## 2020-02-09 DIAGNOSIS — E782 Mixed hyperlipidemia: Secondary | ICD-10-CM | POA: Diagnosis not present

## 2020-02-09 DIAGNOSIS — I4891 Unspecified atrial fibrillation: Secondary | ICD-10-CM | POA: Diagnosis not present

## 2020-02-09 DIAGNOSIS — J479 Bronchiectasis, uncomplicated: Secondary | ICD-10-CM | POA: Diagnosis not present

## 2020-02-09 DIAGNOSIS — M1990 Unspecified osteoarthritis, unspecified site: Secondary | ICD-10-CM | POA: Diagnosis not present

## 2020-02-09 DIAGNOSIS — I10 Essential (primary) hypertension: Secondary | ICD-10-CM | POA: Diagnosis not present

## 2020-02-09 DIAGNOSIS — J439 Emphysema, unspecified: Secondary | ICD-10-CM | POA: Diagnosis not present

## 2020-02-09 NOTE — Telephone Encounter (Signed)
Called and spoke with pt's daughter [name] who stated pt was going to apply for patient assistance for the [employer]. [name] stated she had paperwork emailed to her that they will fill out and then bring to our office to complete and send in for pt. Nothing further needed.

## 2020-02-18 ENCOUNTER — Telehealth: Payer: Self-pay | Admitting: Pulmonary Disease

## 2020-02-18 NOTE — Telephone Encounter (Signed)
Spoke with patient's daughter Eustaquio Maize) regarding prior message.Asvised Beth I will leave a few samples upfront for Stiolto ingalers for patient .Beth stated they are waiting for patient assist to get approved for inhaler. Will wait until the end of week to send in script for patient of choice of pharmacy. Beth's voice was understanding. Nothing else further needed.

## 2020-03-08 ENCOUNTER — Telehealth: Payer: Self-pay | Admitting: Pulmonary Disease

## 2020-03-09 ENCOUNTER — Telehealth: Payer: Self-pay | Admitting: Pulmonary Disease

## 2020-03-09 MED ORDER — STIOLTO RESPIMAT 2.5-2.5 MCG/ACT IN AERS: 2.0000 | INHALATION_SPRAY | Freq: Every day | RESPIRATORY_TRACT | 0 refills | Status: DC

## 2020-03-09 NOTE — Telephone Encounter (Signed)
Patient assistance forms were in Dr. Juline Patch mailbox, filled out, signed and faxed on 03/09/20.

## 2020-03-09 NOTE — Telephone Encounter (Signed)
Spoke with the pt's daughter  She states that the pt assistance forms for stiolto have not been received  She states dropped off the forms a month ago to be done  I can not locate them unfortunately  She has a copy of the forms and will drop off again when she comes to pick up the samples I left for her  Nothing further needed at this tim

## 2020-03-19 ENCOUNTER — Ambulatory Visit: Payer: Medicare Other | Admitting: Physician Assistant

## 2020-03-25 DIAGNOSIS — I10 Essential (primary) hypertension: Secondary | ICD-10-CM | POA: Diagnosis not present

## 2020-03-25 DIAGNOSIS — J479 Bronchiectasis, uncomplicated: Secondary | ICD-10-CM | POA: Diagnosis not present

## 2020-03-25 DIAGNOSIS — I4891 Unspecified atrial fibrillation: Secondary | ICD-10-CM | POA: Diagnosis not present

## 2020-03-25 DIAGNOSIS — Z23 Encounter for immunization: Secondary | ICD-10-CM | POA: Diagnosis not present

## 2020-03-25 DIAGNOSIS — J439 Emphysema, unspecified: Secondary | ICD-10-CM | POA: Diagnosis not present

## 2020-03-25 DIAGNOSIS — M1990 Unspecified osteoarthritis, unspecified site: Secondary | ICD-10-CM | POA: Diagnosis not present

## 2020-03-25 DIAGNOSIS — E782 Mixed hyperlipidemia: Secondary | ICD-10-CM | POA: Diagnosis not present

## 2020-03-26 ENCOUNTER — Telehealth: Payer: Self-pay | Admitting: Pulmonary Disease

## 2020-03-26 MED ORDER — ANORO ELLIPTA 62.5-25 MCG/INH IN AEPB: 1.0000 | INHALATION_SPRAY | Freq: Every day | RESPIRATORY_TRACT | 11 refills | Status: DC

## 2020-03-26 NOTE — Telephone Encounter (Signed)
Spoke with the pt's daughter Benjamine Mola  Pt could not get her pt assistance for [employer] approved  She states that the pt has a hard time using the inhaler anyway bc she can't twist it and needs help each time  Daughter asking if there is another alternative inhaler that may be easier to use that we can try  Please advise, thanks!

## 2020-03-26 NOTE — Telephone Encounter (Signed)
Spoke with [name] and made her aware of response per Dr Valeta Harms  She verbalized understanding  Nothing further needed Rx sent to [facility]

## 2020-03-26 NOTE — Telephone Encounter (Signed)
Please switch to anoro ellipta  1 puff once daily   Garner Nash, DO [facility] 03/26/2020 2:13 PM

## 2020-03-28 NOTE — Progress Notes (Signed)
Cardiology Office Note Date:  03/29/2020  Patient ID:  Bethany, Mcdonald [name], [name], MRN [id number] PCP:  Josetta Huddle, MD  Cardiologist:  Dr. Irish Lack Electrophysiologist: Dr. Curt Bears Pulmonary: Dr. Valeta Harms    Chief Complaint: planned follow up  History of Present Illness: [name] is a 86 y.o. female with history of bronchiectasis, PVCs, AFib  She comes in today to be seen for Dr. Curt Bears, last seen by him Bethany 2021, doing well, planned for [facility] labs.   TODAY She is accompanied by her daughter. She is doing fairly well.  Recently had some adjustment to her inhaler by dr. Valeta Harms and since seems to have noted for a couple months an unusual punding heart beat. Not necessarily fast or irregular, but different and pounding No CP No dizzy spells, near syncope or syncope. She feels her pounding perhaps most days not every day and can last hours.  No bleeding or signs of bleeding       AFib/AAD hx Flecainide 2017 stopped 2017 Holter noted 18% PVC burden and had nocturnal rates in the 20's changed to Amiodarone started Oct 2019  Junctional rhythm required reduction in her CCB  Past Medical History:  Diagnosis Date  . Bronchiectasis ([place])    with multiple pneumonia back in 1990's  . Bronchitis    episodic  . DJD (degenerative joint disease) of knee   . GERD (gastroesophageal reflux disease)    episodic symptoms  . Hearing loss    chronic moderate hearing loss, worked up by ENT and hearing aids have been recommended  . History of nuclear stress test 11/07   NORMAL  . Insomnia   . PVC's (premature ventricular contractions)    on EKG, controlled on atenolol and caffeine reduction  . Tinnitus    chronic    Past Surgical History:  Procedure Laterality Date  . BREAST EXCISIONAL BIOPSY Left   . CARDIOVERSION N/A 08/31/2015   Procedure: CARDIOVERSION;  Surgeon: Jerline Pain, MD;  Location: [facility] ENDOSCOPY;  Service: Cardiovascular;  Laterality: N/A;  . cyst removed       left breast    Current Outpatient Medications  Medication Sig Dispense Refill  . apixaban (ELIQUIS) 2.5 MG TABS tablet TAKE 1 TABLET(2.5 MG) BY MOUTH TWICE DAILY 180 tablet 2  . Cholecalciferol (VITAMIN D3) 2000 units TABS Take 1 capsule by mouth daily.    . clonazePAM (KLONOPIN) 0.5 MG tablet Take 0.25 mg by mouth 2 (two) times daily as needed for anxiety.     . fluticasone-salmeterol (ADVAIR HFA) 115-21 MCG/ACT inhaler Inhale 2 puffs into the lungs 2 (two) times daily. 12 g 6  . furosemide (LASIX) 40 MG tablet Take 40 mg by mouth daily as needed.    . Melatonin 10 MG TABS Take by mouth.    . sertraline (ZOLOFT) 50 MG tablet Take 50 mg by mouth daily.    . Tiotropium Bromide-Olodaterol (STIOLTO RESPIMAT) 2.5-2.5 MCG/ACT AERS Inhale 2 puffs into the lungs daily. 4 g 0  . umeclidinium-vilanterol (ANORO ELLIPTA) 62.5-25 MCG/INH AEPB Inhale 1 puff into the lungs daily. 30 each 11   No current facility-administered medications for this visit.    Allergies:   Augmentin [amoxicillin-pot clavulanate], Biaxin [clarithromycin], Metoprolol, and Tequin [gatifloxacin]   Social History:  The patient  reports that she quit smoking about 51 years ago. Her smoking use included cigarettes. She has a 10.00 pack-year smoking history. She has never used smokeless tobacco. She reports current alcohol use. She reports that she  does not use drugs.   Family History:  The patient's family history includes Asthma in her sister; Clotting disorder in her sister; Hypertension in her father and mother; Stroke in her father and mother.  ROS:  Please see the history of present illness.    All other systems are reviewed and otherwise negative.   PHYSICAL EXAM:  VS:  BP 140/62   Pulse 66   Ht 5' 2.5" (1.588 m)   Wt 109 lb 12.8 oz (49.8 kg)   SpO2 97%   BMI 19.76 kg/m  BMI: Body mass index is 19.76 kg/m. Well nourished, well developed, in no acute distress HEENT: normocephalic, atraumatic Neck: no JVD, carotid  bruits or masses Cardiac:  RRR; no significant murmurs, no rubs, or gallops Lungs:  CTA b/l, no wheezing, rhonchi or rales Abd: soft, nontender MS: no deformity, age appropriate, perhaps advanced atrophy Ext:  no edema Skin: warm and dry, no rash Neuro:  No gross deficits appreciated Psych: euthymic mood, full affect   EKG:  Done today and reviewed by myself shows  Junctional rhythm, 54bpm, QRS 59ms  TTE 07/03/15  - Left ventricle: The cavity size was normal. Systolic function was normal. The estimated ejection fraction was in the range of 55% to 60%. Wall motion was normal; there were no regional wall motion abnormalities.  Holter 12/13/15  NSR, intermittent junctional rhythm.  PACs, PVCs.  Minimum HR 30 at 4AM, presumably during sleep.  No atrial fibrillation.  Holter 02/28/16 Minimum HR: 17 BPM at 3:44:42 AM Maximum HR: 114 BPM at 9:07:34 AM Average HR: 51 BPM 18.2% PVCs 2.9% APCs Sinus rhytnm Zero atrial fibrillation Occasional junctional rhythm during sleeping hours 4.8 second sinus pause during sleeping hours Chest pounding associated with ventricular bigeminy Longest ventriclar run 12 beats   Recent Labs: 09/25/2019: ALT 12; TSH 4.050  No results found for requested labs within last 8760 hours.   CrCl cannot be calculated (Patient's most recent lab result is older than the maximum 21 days allowed.).   Wt Readings from Last 3 Encounters:  11/Mcdonald/21 109 lb 12.8 oz (49.8 kg)  01/01/20 110 lb 12.8 oz (50.3 kg)  09/25/19 107 lb (48.5 kg)     Other studies reviewed: Additional studies/records reviewed today include: summarized above  ASSESSMENT AND PLAN:  1. Paroxysmal Afib     CHA2DS2Vasc is 3, on Eliquis, appropriately dosed for age/weight     eliquis labs today      She is in junctional rhythm today, 54bpm, narrow QRS  No symptoms of bradycardia Feels her heart beat different, not fast or irregular   2. PVCs     On amiodarone (for AF as  well)      chronically on amio now for years, pulm status appears to be stable, follows with Dr. Valeta Harms      Failed 1c      Tikosyn will not address her PVCs        I discussed the case over the phone with Dr. Curt Bears Will stop her amiodarone Follow up with him in a couple weeks.  I have discussed symptoms of bradycardia with the patient and her daughter, if she develops any near syncope or syncope, to seek attention        Disposition: F/u as above  Current medicines are reviewed at length with the patient today.  The patient did not have any concerns regarding medicines.  Venetia Night, PA-C 03/29/2020 4:03 PM     [place]  [facility] [place] [address] [phone] (office)  [phone] (fax)

## 2020-03-29 ENCOUNTER — Encounter: Payer: Self-pay | Admitting: Physician Assistant

## 2020-03-29 ENCOUNTER — Other Ambulatory Visit: Payer: Self-pay

## 2020-03-29 ENCOUNTER — Ambulatory Visit (INDEPENDENT_AMBULATORY_CARE_PROVIDER_SITE_OTHER): Payer: Medicare Other | Admitting: Physician Assistant

## 2020-03-29 VITALS — BP 140/62 | HR 66 | Ht 62.5 in | Wt 109.8 lb

## 2020-03-29 DIAGNOSIS — I493 Ventricular premature depolarization: Secondary | ICD-10-CM | POA: Diagnosis not present

## 2020-03-29 DIAGNOSIS — I498 Other specified cardiac arrhythmias: Secondary | ICD-10-CM | POA: Diagnosis not present

## 2020-03-29 DIAGNOSIS — I480 Paroxysmal atrial fibrillation: Secondary | ICD-10-CM | POA: Diagnosis not present

## 2020-03-29 DIAGNOSIS — Z79899 Other long term (current) drug therapy: Secondary | ICD-10-CM

## 2020-03-29 NOTE — Patient Instructions (Addendum)
Medication Instructions:    STOP TAKING AMIODARONE 200 MG   *If you need a refill on your cardiac medications before your next appointment, please call your pharmacy*   Lab Work:  BMET AND CBC TODAY   If you have labs (blood work) drawn today and your tests are completely normal, you will receive your results only by: Marland Kitchen MyChart Message (if you have MyChart) OR . A paper copy in the mail If you have any lab test that is abnormal or we need to change your treatment, we will call you to review the results.   Testing/Procedures: NONE ORDERED  TODAY    Follow-Up: At [facility], you and your health needs are our priority.  As part of our continuing mission to provide you with exceptional heart care, we have created designated Provider Care Teams.  These Care Teams include your primary Cardiologist (physician) and Advanced Practice Providers (APPs -  Physician Assistants and Nurse Practitioners) who all work together to provide you with the care you need, when you need it.  We recommend signing up for the patient portal called "MyChart".  Sign up information is provided on this After Visit Summary.  MyChart is used to connect with patients for Virtual Visits (Telemedicine).  Patients are able to view lab/test results, encounter notes, upcoming appointments, etc.  Non-urgent messages can be sent to your provider as well.   To learn more about what you can do with MyChart, go to [email or web address].    Your next appointment:   2 week(s)  The format for your next appointment:   In Person  Provider:   Allegra Lai, MD   Other Instructions

## 2020-03-30 LAB — BASIC METABOLIC PANEL
BUN/Creatinine Ratio: 18 (ref 12–28)
BUN: 14 mg/dL (ref 8–27)
CO2: 29 mmol/L (ref 20–29)
Calcium: 9.3 mg/dL (ref 8.7–10.3)
Chloride: 107 mmol/L — ABNORMAL HIGH (ref 96–106)
Creatinine, Ser: 0.78 mg/dL (ref 0.57–1.00)
GFR calc Af Amer: 79 mL/min/{1.73_m2} (ref >59)
GFR calc non Af Amer: 69 mL/min/{1.73_m2} (ref >59)
Glucose: 75 mg/dL (ref 65–99)
Potassium: 4.7 mmol/L (ref 3.5–5.2)
Sodium: 148 mmol/L — ABNORMAL HIGH (ref 134–144)

## 2020-03-30 LAB — CBC
Hematocrit: 41.3 % (ref 34.0–46.6)
Hemoglobin: 13.6 g/dL (ref 11.1–15.9)
MCH: 29.9 pg (ref 26.6–33.0)
MCHC: 32.9 g/dL (ref 31.5–35.7)
MCV: 91 fL (ref 79–97)
Platelets: 171 10*3/uL (ref 150–450)
RBC: 4.55 x10E6/uL (ref 3.77–5.28)
RDW: 13.1 % (ref 11.7–15.4)
WBC: 6.4 10*3/uL (ref 3.4–10.8)

## 2020-04-06 ENCOUNTER — Other Ambulatory Visit: Payer: Self-pay

## 2020-04-06 ENCOUNTER — Encounter: Payer: Self-pay | Admitting: Cardiology

## 2020-04-06 ENCOUNTER — Ambulatory Visit (INDEPENDENT_AMBULATORY_CARE_PROVIDER_SITE_OTHER): Payer: Medicare Other | Admitting: Cardiology

## 2020-04-06 VITALS — BP 132/68 | HR 62 | Ht 62.0 in | Wt 109.0 lb

## 2020-04-06 DIAGNOSIS — I480 Paroxysmal atrial fibrillation: Secondary | ICD-10-CM

## 2020-04-06 NOTE — Progress Notes (Signed)
Electrophysiology Office Note   Date:  04/06/2020   ID:  IMOGEAN CIAMPA, DOB 1930/07/16, MRN [id number]  PCP:  Josetta Huddle, MD  Cardiologist:  Christ Kick Primary Electrophysiologist:  Seanpaul Preece Meredith Leeds, MD    No chief complaint on file.    History of Present Illness: [name] is a 86 y.o. female who presents today for electrophysiology evaluation.  She has a history of paroxysmal atrial fibrillation on Eliquis.  She had a cardioversion for September 02, 2015 which was unsuccessful.  Her flecainide was increased and she was in sinus rhythm when she showed up to the hospital.  She wore a cardiac monitor that showed an 18% PVC burden but no episodes of atrial fibrillation.  She did have episodes of junctional rhythm with heart rates in the 20s during sleeping hours.  She was switched to amiodarone which improved her PVC burden.   Today, denies symptoms of palpitations, chest pain, shortness of breath, orthopnea, PND, lower extremity edema, claudication, dizziness, presyncope, syncope, bleeding, or neurologic sequela. The patient is tolerating medications without difficulties.  She presented to cardiology clinic feeling weak and fatigued a week ago.  She has atrial fibrillation and PVCs and has been on amiodarone.  Amiodarone was stopped.  She feels much improved without the medication with quite a bit more energy.   Past Medical History:  Diagnosis Date  . Bronchiectasis ([place])    with multiple pneumonia back in 1990's  . Bronchitis    episodic  . DJD (degenerative joint disease) of knee   . GERD (gastroesophageal reflux disease)    episodic symptoms  . Hearing loss    chronic moderate hearing loss, worked up by ENT and hearing aids have been recommended  . History of nuclear stress test 11/07   NORMAL  . Insomnia   . PVC's (premature ventricular contractions)    on EKG, controlled on atenolol and caffeine reduction  . Tinnitus    chronic   Past Surgical History:  Procedure  Laterality Date  . BREAST EXCISIONAL BIOPSY Left   . CARDIOVERSION N/A 08/31/2015   Procedure: CARDIOVERSION;  Surgeon: Jerline Pain, MD;  Location: [facility] ENDOSCOPY;  Service: Cardiovascular;  Laterality: N/A;  . cyst removed     left breast     Current Outpatient Medications  Medication Sig Dispense Refill  . apixaban (ELIQUIS) 2.5 MG TABS tablet TAKE 1 TABLET(2.5 MG) BY MOUTH TWICE DAILY 180 tablet 2  . Cholecalciferol (VITAMIN D3) 2000 units TABS Take 1 capsule by mouth daily.    . clonazePAM (KLONOPIN) 0.5 MG tablet Take 0.25 mg by mouth 2 (two) times daily as needed for anxiety.     . fluticasone-salmeterol (ADVAIR HFA) 115-21 MCG/ACT inhaler Inhale 2 puffs into the lungs 2 (two) times daily. 12 g 6  . furosemide (LASIX) 40 MG tablet Take 40 mg by mouth daily as needed.    . Melatonin 10 MG TABS Take by mouth.    . sertraline (ZOLOFT) 50 MG tablet Take 50 mg by mouth daily.    . Tiotropium Bromide-Olodaterol (STIOLTO RESPIMAT) 2.5-2.5 MCG/ACT AERS Inhale 2 puffs into the lungs daily. 4 g 0  . umeclidinium-vilanterol (ANORO ELLIPTA) 62.5-25 MCG/INH AEPB Inhale 1 puff into the lungs daily. 30 each 11   No current facility-administered medications for this visit.    Allergies:   Augmentin [amoxicillin-pot clavulanate], Biaxin [clarithromycin], Metoprolol, and Tequin [gatifloxacin]   Social History:  The patient  reports that she quit smoking about 51 years ago.  Her smoking use included cigarettes. She has a 10.00 pack-year smoking history. She has never used smokeless tobacco. She reports current alcohol use. She reports that she does not use drugs.   Family History:  The patient's family history includes Asthma in her sister; Clotting disorder in her sister; Hypertension in her father and mother; Stroke in her father and mother.   ROS:  Please see the history of present illness.   Otherwise, review of systems is positive for none.   All other systems are reviewed and negative.    PHYSICAL EXAM: VS:  BP 132/68   Pulse 62   Ht 5\' 2"  (1.575 m)   Wt 109 lb (49.4 kg)   SpO2 98%   BMI 19.94 kg/m  , BMI Body mass index is 19.94 kg/m. GEN: Well nourished, well developed, in no acute distress  HEENT: normal  Neck: no JVD, carotid bruits, or masses Cardiac: RRR; no murmurs, rubs, or gallops,no edema  Respiratory:  clear to auscultation bilaterally, normal work of breathing GI: soft, nontender, nondistended, + BS MS: no deformity or atrophy  Skin: warm and dry Neuro:  Strength and sensation are intact Psych: euthymic mood, full affect  EKG:  EKG is not ordered today. Personal review of the ekg ordered 03/29/20 shows junctional rhythm, rate 54  Recent Labs: 09/25/2019: ALT 12; TSH 4.050 03/29/2020: BUN 14; Creatinine, Ser 0.78; Hemoglobin 13.6; Platelets 171; Potassium 4.7; Sodium 148    Lipid Panel     Component Value Date/Time   CHOL 163 07/03/2015 0440   TRIG 73 07/03/2015 0440   HDL 57 07/03/2015 0440   CHOLHDL 2.9 07/03/2015 0440   VLDL 15 07/03/2015 0440   LDLCALC 91 07/03/2015 0440     Wt Readings from Last 3 Encounters:  04/06/20 109 lb (49.4 kg)  03/29/20 109 lb 12.8 oz (49.8 kg)  01/01/20 110 lb 12.8 oz (50.3 kg)      Other studies Reviewed: Additional studies/ records that were reviewed today include: TTE 07/03/15  Review of the above records today demonstrates:  - Left ventricle: The cavity size was normal. Systolic function was   normal. The estimated ejection fraction was in the range of 55%   to 60%. Wall motion was normal; there were no regional wall   motion abnormalities.  Holter 12/13/15  NSR, intermittent junctional rhythm.  PACs, PVCs.  Minimum HR 30 at 4AM, presumably during sleep.  No atrial fibrillation.  Holter 02/28/16 Minimum HR: 17 BPM at 3:44:42 AM Maximum HR: 114 BPM at 9:07:34 AM Average HR: 51 BPM 18.2% PVCs 2.9% APCs Sinus rhytnm Zero atrial fibrillation Occasional junctional rhythm during sleeping  hours 4.8 second sinus pause during sleeping hours Chest pounding associated with ventricular bigeminy Longest ventriclar run 12 beats  ASSESSMENT AND PLAN:  1.  Paroxysmal atrial fibrillation: Currently on amiodarone (monitoring for high risk medication) and Eliquis.  CHA2DS2-VASc of 3.  Her amiodarone was recently stopped as she was in junctional rhythm.  We Berkley Wrightsman continue to hold off on her amiodarone and make further adjustments antiarrhythmics once this is washed out.  She is feeling better.  No indication for pacemaker implant at this time.   2.  PVCs: Found on cardiac monitor.  Much improved on amiodarone.  Unfortunately amiodarone had to be stopped due to junctional rhythm and feeling poorly.  She feels much better without amiodarone.  We Amberlyn Martinezgarcia assess PVC burden once we have for washout.  Current medicines are reviewed at length with the patient today.  The patient has concerns regarding her medicines.  The following changes were made today: None  Labs/ tests ordered today include:  Orders Placed This Encounter  Procedures  . EKG 12-Lead     Disposition:   FU with Azarius Lambson 3 months  Signed, Kimiko Common Meredith Leeds, MD  04/06/2020 3:51 PM     [facility] [address] [place] [address] [phone] (office) [phone] (fax)

## 2020-04-06 NOTE — Patient Instructions (Signed)
Medication Instructions:  Your physician recommends that you continue on your current medications as directed. Please refer to the Current Medication list given to you today.  *If you need a refill on your cardiac medications before your next appointment, please call your pharmacy*   Lab Work: None ordered If you have labs (blood work) drawn today and your tests are completely normal, you will receive your results only by: . MyChart Message (if you have MyChart) OR . A paper copy in the mail If you have any lab test that is abnormal or we need to change your treatment, we will call you to review the results.   Testing/Procedures: None ordered   Follow-Up: At [facility], you and your health needs are our priority.  As part of our continuing mission to provide you with exceptional heart care, we have created designated Provider Care Teams.  These Care Teams include your primary Cardiologist (physician) and Advanced Practice Providers (APPs -  Physician Assistants and Nurse Practitioners) who all work together to provide you with the care you need, when you need it.  We recommend signing up for the patient portal called "MyChart".  Sign up information is provided on this After Visit Summary.  MyChart is used to connect with patients for Virtual Visits (Telemedicine).  Patients are able to view lab/test results, encounter notes, upcoming appointments, etc.  Non-urgent messages can be sent to your provider as well.   To learn more about what you can do with MyChart, go to [email or web address].    Your next appointment:   3 month(s)  The format for your next appointment:   In Person  Provider:   Will Camnitz, MD   Thank you for choosing [facility]!!   Dathan Attia, RN [phone]    Other Instructions    

## 2020-05-15 DIAGNOSIS — I4891 Unspecified atrial fibrillation: Secondary | ICD-10-CM | POA: Diagnosis not present

## 2020-05-15 DIAGNOSIS — J439 Emphysema, unspecified: Secondary | ICD-10-CM | POA: Diagnosis not present

## 2020-05-15 DIAGNOSIS — I10 Essential (primary) hypertension: Secondary | ICD-10-CM | POA: Diagnosis not present

## 2020-05-15 DIAGNOSIS — J479 Bronchiectasis, uncomplicated: Secondary | ICD-10-CM | POA: Diagnosis not present

## 2020-05-15 DIAGNOSIS — E782 Mixed hyperlipidemia: Secondary | ICD-10-CM | POA: Diagnosis not present

## 2020-05-15 DIAGNOSIS — M1990 Unspecified osteoarthritis, unspecified site: Secondary | ICD-10-CM | POA: Diagnosis not present

## 2020-05-26 ENCOUNTER — Telehealth: Payer: Self-pay | Admitting: *Deleted

## 2020-05-26 ENCOUNTER — Other Ambulatory Visit: Payer: Self-pay | Admitting: *Deleted

## 2020-05-26 MED ORDER — ANORO ELLIPTA 62.5-25 MCG/INH IN AEPB: 1.0000 | INHALATION_SPRAY | Freq: Every day | RESPIRATORY_TRACT | 3 refills | Status: DC

## 2020-05-26 NOTE — Telephone Encounter (Signed)
[name] daughter checking on the patient assistance forms for Anoro. [name] phone number is [phone].

## 2020-05-26 NOTE — Telephone Encounter (Signed)
Routing message 

## 2020-05-26 NOTE — Telephone Encounter (Signed)
Pt assistance forms have been received and the rx for the anoro has been printed out and waiting for signature from BI.  Will fax these once done.

## 2020-05-27 NOTE — Telephone Encounter (Signed)
Pharmacy team has not been working on these forms. Routing to [place] and [employer].

## 2020-05-27 NOTE — Telephone Encounter (Signed)
I have these forms and I have faxed these and will hold on to them till Monday.

## 2020-05-27 NOTE — Telephone Encounter (Signed)
[name] checking on the patient assistance forms for Anoro. [name] phone number is [phone].

## 2020-05-31 NOTE — Telephone Encounter (Signed)
Pt's daughter calling in upset because nobody submitted the patience assistance forms for pt's Anoro - and now it's "too late" for the 2022 year - please call Lanora Manis (daughter- per [facility]) [phone]

## 2020-05-31 NOTE — Telephone Encounter (Signed)
Called [employer] at [phone] and spoke with Rosey Bath. She stated that they do not have the application and it is too late for the 2022 year. She stated that if we can fax a copy of the application as well as the confirmation page for the fax, they may be able to process the patient for the 2022 year using last year's documents.   Spoke with patient's daughter. I advised her of the conversation I had with [employer]. She was very Adult nurse. I advised her that I would re-fax the paperwork today. Found the application. Will refax application with an explanation.

## 2020-06-21 DIAGNOSIS — J439 Emphysema, unspecified: Secondary | ICD-10-CM | POA: Diagnosis not present

## 2020-06-21 DIAGNOSIS — I10 Essential (primary) hypertension: Secondary | ICD-10-CM | POA: Diagnosis not present

## 2020-06-21 DIAGNOSIS — E0580 Other thyrotoxicosis without thyrotoxic crisis or storm: Secondary | ICD-10-CM | POA: Diagnosis not present

## 2020-06-21 DIAGNOSIS — Z1389 Encounter for screening for other disorder: Secondary | ICD-10-CM | POA: Diagnosis not present

## 2020-06-21 DIAGNOSIS — I4891 Unspecified atrial fibrillation: Secondary | ICD-10-CM | POA: Diagnosis not present

## 2020-06-21 DIAGNOSIS — E782 Mixed hyperlipidemia: Secondary | ICD-10-CM | POA: Diagnosis not present

## 2020-06-21 DIAGNOSIS — D6869 Other thrombophilia: Secondary | ICD-10-CM | POA: Diagnosis not present

## 2020-06-21 DIAGNOSIS — L309 Dermatitis, unspecified: Secondary | ICD-10-CM | POA: Diagnosis not present

## 2020-06-21 DIAGNOSIS — E538 Deficiency of other specified B group vitamins: Secondary | ICD-10-CM | POA: Diagnosis not present

## 2020-06-21 DIAGNOSIS — Z0000 Encounter for general adult medical examination without abnormal findings: Secondary | ICD-10-CM | POA: Diagnosis not present

## 2020-06-21 DIAGNOSIS — F4321 Adjustment disorder with depressed mood: Secondary | ICD-10-CM | POA: Diagnosis not present

## 2020-06-21 DIAGNOSIS — E559 Vitamin D deficiency, unspecified: Secondary | ICD-10-CM | POA: Diagnosis not present

## 2020-06-21 DIAGNOSIS — J479 Bronchiectasis, uncomplicated: Secondary | ICD-10-CM | POA: Diagnosis not present

## 2020-06-21 DIAGNOSIS — H9190 Unspecified hearing loss, unspecified ear: Secondary | ICD-10-CM | POA: Diagnosis not present

## 2020-06-30 ENCOUNTER — Other Ambulatory Visit: Payer: Self-pay

## 2020-06-30 ENCOUNTER — Ambulatory Visit (INDEPENDENT_AMBULATORY_CARE_PROVIDER_SITE_OTHER): Payer: Medicare Other | Admitting: Pulmonary Disease

## 2020-06-30 ENCOUNTER — Encounter: Payer: Self-pay | Admitting: Pulmonary Disease

## 2020-06-30 VITALS — BP 104/62 | HR 51 | Temp 97.9°F | Ht 62.5 in | Wt 112.2 lb

## 2020-06-30 DIAGNOSIS — J479 Bronchiectasis, uncomplicated: Secondary | ICD-10-CM | POA: Diagnosis not present

## 2020-06-30 DIAGNOSIS — Z2239 Carrier of other specified bacterial diseases: Secondary | ICD-10-CM | POA: Diagnosis not present

## 2020-06-30 DIAGNOSIS — J449 Chronic obstructive pulmonary disease, unspecified: Secondary | ICD-10-CM

## 2020-06-30 MED ORDER — IPRATROPIUM-ALBUTEROL 0.5-2.5 (3) MG/3ML IN SOLN: 3.0000 mL | Freq: Four times a day (QID) | RESPIRATORY_TRACT | 11 refills | Status: DC | PRN

## 2020-06-30 NOTE — Progress Notes (Signed)
Synopsis: Referred in August 2021 for establish care with new primary pulmonologist, former patient Dr. Lenna Gilford by Josetta Huddle, MD  Subjective:   PATIENT ID: [name] GENDER: female DOB: 1931-04-16, MRN: [id number]  Chief Complaint  Patient presents with  . Follow-up    Pt will try and do the pt assistance again for her anoro as this medication is about $200 each fill per month.      Is an 87 year old female with medical history of bronchiectasis and recurrent pneumonia in the 90s.  She has upper lobe and right middle lobe bronchiectasis on CT scan in 2016.  Also had prior cultures positive for Pseudomonas.  Has been followed for treatment of bronchiectasis by Dr. Lenna Gilford for several years.  Currently managed with Advair.  She was supposed to be seen 6 months into the Covid pandemic and stopped coming as she was doing really well.  So here now for establish care.  Patient does have occasional sputum production but otherwise her respiratory symptoms have been stable for the past year.  OV 06/30/20: Since last office visit have been seen by cardiology.  Saw cardiology in April 17, 2020 Dr. Curt Bears.  Currently on Eliquis for PAF.  Flecainide for rhythm control.  Recent cardiac monitor with no atrial fibrillation showing 18% PVC burden.  Has been switched from [employer] to [employer].  She is unable to use the Stiolto from a technical standpoint.  The Anoro Ellipta is very expensive currently costing $200 a month.  They have applied for financial assistance.  From respiratory standpoint she feels pretty good has very little sputum production.  Does not have a flutter valve   Past Medical History:  Diagnosis Date  . Bronchiectasis ([place])    with multiple pneumonia back in 1990's  . Bronchitis    episodic  . DJD (degenerative joint disease) of knee   . GERD (gastroesophageal reflux disease)    episodic symptoms  . Hearing loss    chronic moderate hearing loss, worked up by ENT and hearing  aids have been recommended  . History of nuclear stress test 11/07   NORMAL  . Insomnia   . PVC's (premature ventricular contractions)    on EKG, controlled on atenolol and caffeine reduction  . Tinnitus    chronic     Family History  Problem Relation Age of Onset  . Stroke Father   . Hypertension Father   . Stroke Mother   . Hypertension Mother   . Asthma Sister   . Clotting disorder Sister   . Heart attack Neg Hx      Past Surgical History:  Procedure Laterality Date  . BREAST EXCISIONAL BIOPSY Left   . CARDIOVERSION N/A 08/31/2015   Procedure: CARDIOVERSION;  Surgeon: Jerline Pain, MD;  Location: [facility] ENDOSCOPY;  Service: Cardiovascular;  Laterality: N/A;  . cyst removed     left breast    Social History   Socioeconomic History  . Marital status: Divorced    Spouse name: Not on file  . Number of children: Not on file  . Years of education: Not on file  . Highest education level: Not on file  Occupational History  . Occupation: retired  Tobacco Use  . Smoking status: Former Smoker    Packs/day: 0.50    Years: 20.00    Pack years: 10.00    Types: Cigarettes    Quit date: 05/29/1968    Years since quitting: 52.1  . Smokeless tobacco: Never Used  Vaping Use  . Vaping Use: Never used  Substance and Sexual Activity  . Alcohol use: Yes    Alcohol/week: 0.0 standard drinks    Comment: 1 can of beer at night  . Drug use: No  . Sexual activity: Not on file  Other Topics Concern  . Not on file  Social History Narrative  . Not on file   Social Determinants of Health   Financial Resource Strain: Not on file  Food Insecurity: Not on file  Transportation Needs: Not on file  Physical Activity: Not on file  Stress: Not on file  Social Connections: Not on file  Intimate Partner Violence: Not on file     Allergies  Allergen Reactions  . Augmentin [Amoxicillin-Pot Clavulanate] Nausea And Vomiting  . Biaxin [Clarithromycin] Nausea And Vomiting  . Metoprolol Other  (See Comments)    Patient notes she "feels funny" after taking it  . Tequin [Gatifloxacin] Nausea Only     Outpatient Medications Prior to Visit  Medication Sig Dispense Refill  . apixaban (ELIQUIS) 2.5 MG TABS tablet TAKE 1 TABLET(2.5 MG) BY MOUTH TWICE DAILY 180 tablet 2  . Cholecalciferol (VITAMIN D3) 2000 units TABS Take 1 capsule by mouth daily.    . Melatonin 10 MG TABS Take by mouth.    . sertraline (ZOLOFT) 50 MG tablet Take 50 mg by mouth daily.    Marland Kitchen umeclidinium-vilanterol (ANORO ELLIPTA) 62.5-25 MCG/INH AEPB Inhale 1 puff into the lungs daily. 180 each 3  . clonazePAM (KLONOPIN) 0.5 MG tablet Take 0.25 mg by mouth 2 (two) times daily as needed for anxiety.  (Patient not taking: Reported on 06/30/2020)    . fluticasone-salmeterol (ADVAIR HFA) 115-21 MCG/ACT inhaler Inhale 2 puffs into the lungs 2 (two) times daily. (Patient not taking: Reported on 06/30/2020) 12 g 6  . furosemide (LASIX) 40 MG tablet Take 40 mg by mouth daily as needed. (Patient not taking: Reported on 06/30/2020)    . Tiotropium Bromide-Olodaterol (STIOLTO RESPIMAT) 2.5-2.5 MCG/ACT AERS Inhale 2 puffs into the lungs daily. (Patient not taking: Reported on 06/30/2020) 4 g 0   No facility-administered medications prior to visit.    Review of Systems  Constitutional: Negative for chills, fever, malaise/fatigue and weight loss.  HENT: Negative for hearing loss, sore throat and tinnitus.   Eyes: Negative for blurred vision and double vision.  Respiratory: Positive for cough, sputum production and shortness of breath. Negative for hemoptysis, wheezing and stridor.   Cardiovascular: Negative for chest pain, palpitations, orthopnea, leg swelling and PND.  Gastrointestinal: Negative for abdominal pain, constipation, diarrhea, heartburn, nausea and vomiting.  Genitourinary: Negative for dysuria, hematuria and urgency.  Musculoskeletal: Negative for joint pain and myalgias.  Skin: Negative for itching and rash.  Neurological:  Negative for dizziness, tingling, weakness and headaches.  Endo/Heme/Allergies: Negative for environmental allergies. Does not bruise/bleed easily.  Psychiatric/Behavioral: Negative for depression. The patient is not nervous/anxious and does not have insomnia.   All other systems reviewed and are negative.    Objective:  Physical Exam Vitals reviewed.  Constitutional:      General: She is not in acute distress.    Appearance: She is well-developed and well-nourished.     Comments: Thin  HENT:     Head: Normocephalic and atraumatic.     Mouth/Throat:     Mouth: Oropharynx is clear and moist.  Eyes:     General: No scleral icterus.    Conjunctiva/sclera: Conjunctivae normal.     Pupils: Pupils are equal, round,  and reactive to light.  Neck:     Vascular: No JVD.     Trachea: No tracheal deviation.  Cardiovascular:     Rate and Rhythm: Normal rate and regular rhythm.     Pulses: Intact distal pulses.     Heart sounds: Normal heart sounds. No murmur heard.   Pulmonary:     Effort: Pulmonary effort is normal. No tachypnea, accessory muscle usage or respiratory distress.     Breath sounds: No stridor. No wheezing, rhonchi or rales.     Comments: Tubular breath sounds Abdominal:     General: Bowel sounds are normal. There is no distension.     Palpations: Abdomen is soft.     Tenderness: There is no abdominal tenderness.  Musculoskeletal:        General: No tenderness or edema.     Cervical back: Neck supple.  Lymphadenopathy:     Cervical: No cervical adenopathy.  Skin:    General: Skin is warm and dry.     Capillary Refill: Capillary refill takes less than 2 seconds.     Findings: No rash.  Neurological:     Mental Status: She is alert and oriented to person, place, and time.  Psychiatric:        Mood and Affect: Mood and affect normal.        Behavior: Behavior normal.      Vitals:   06/30/20 1556  BP: 104/62  Pulse: (!) 51  Temp: 97.9 F (36.6 C)  TempSrc:  Tympanic  SpO2: 97%  Weight: 112 lb 4 oz (50.9 kg)  Height: 5' 2.5" (1.588 m)   97% on RA BMI Readings from Last 3 Encounters:  06/30/20 20.20 kg/m  04/06/20 19.94 kg/m  03/29/20 19.76 kg/m   Wt Readings from Last 3 Encounters:  06/30/20 112 lb 4 oz (50.9 kg)  04/06/20 109 lb (49.4 kg)  03/29/20 109 lb 12.8 oz (49.8 kg)     CBC    Component Value Date/Time   WBC 6.4 03/29/2020 1435   WBC 4.9 02/23/2016 1427   RBC 4.55 03/29/2020 1435   RBC 4.58 02/23/2016 1427   HGB 13.6 03/29/2020 1435   HCT 41.3 03/29/2020 1435   PLT 171 03/29/2020 1435   MCV 91 03/29/2020 1435   MCH 29.9 03/29/2020 1435   MCH 29.7 02/23/2016 1427   MCHC 32.9 03/29/2020 1435   MCHC 33.9 02/23/2016 1427   RDW 13.1 03/29/2020 1435   LYMPHSABS 1.4 09/07/2017 1501   MONOABS 567 01/13/2016 1302   EOSABS 0.1 09/07/2017 1501   BASOSABS 0.0 09/07/2017 1501    Chest Imaging: CT chest 2016: Bilateral bronchiectasis The patient's images have been independently reviewed by me.    Pulmonary Functions Testing Results: PFT Results Latest Ref Rng & Units 11/10/2014  FVC-Pre L 1.80  FVC-Predicted Pre % 78  FVC-Post L 1.91  FVC-Predicted Post % 83  Pre FEV1/FVC % % 50  Post FEV1/FCV % % 57  FEV1-Pre L 0.90  FEV1-Predicted Pre % 53  FEV1-Post L 1.08  DLCO uncorrected ml/min/mmHg 9.33  DLCO UNC% % 43  DLVA Predicted % 91  TLC L 4.66  TLC % Predicted % 98  RV % Predicted % 117    FeNO:   Pathology:   Echocardiogram:   Heart Catheterization:     Assessment & Plan:     ICD-10-CM   1. Bronchiectasis without complication (HCC)  A999333 AMB REFERRAL FOR DME    ipratropium-albuterol (DUONEB) 0.5-2.5 (3) MG/3ML  SOLN    Flutter valve  2. Obstructive lung disease (generalized) ([place])  J44.9   3. Pseudomonas aeruginosa colonization  Z22.39     Discussion:  87 year old female history of bronchiectasis, prior Pseudomonas with and cultures, prior AFB negative.  Has been managed on ICS/LABA for a  long time was recently switched.  PFTs with obstructive disease which I believe is related to her bronchiectasis.  Had a few cigarettes many many years ago but never a longstanding smoker.  Plan: Switch to nebulized albuterol plus ipratropium, every 6 hours as needed. Can stop using Anoro due to cost. If she needs to go back to a daily maintenance inhaler we can but I think we should try the nebulizers as a cheaper option first. We will get her new prescription for DME supply of nebulizer machine and tubing Patient to call us if symptoms change or worsen. New prescription for flutter valve to use as much as tolerated.   Current Outpatient Medications:  .  apixaban (ELIQUIS) 2.5 MG TABS tablet, TAKE 1 TABLET(2.5 MG) BY MOUTH TWICE DAILY, Disp: 180 tablet, Rfl: 2 .  Cholecalciferol (VITAMIN D3) 2000 units TABS, Take 1 capsule by mouth daily., Disp: , Rfl:  .  Melatonin 10 MG TABS, Take by mouth., Disp: , Rfl:  .  sertraline (ZOLOFT) 50 MG tablet, Take 50 mg by mouth daily., Disp: , Rfl:  .  umeclidinium-vilanterol (ANORO ELLIPTA) 62.5-25 MCG/INH AEPB, Inhale 1 puff into the lungs daily., Disp: 180 each, Rfl: 3 .  clonazePAM (KLONOPIN) 0.5 MG tablet, Take 0.25 mg by mouth 2 (two) times daily as needed for anxiety.  (Patient not taking: Reported on 06/30/2020), Disp: , Rfl:  .  fluticasone-salmeterol (ADVAIR HFA) 115-21 MCG/ACT inhaler, Inhale 2 puffs into the lungs 2 (two) times daily. (Patient not taking: Reported on 06/30/2020), Disp: 12 g, Rfl: 6 .  furosemide (LASIX) 40 MG tablet, Take 40 mg by mouth daily as needed. (Patient not taking: Reported on 06/30/2020), Disp: , Rfl:  .  Tiotropium Bromide-Olodaterol (STIOLTO RESPIMAT) 2.5-2.5 MCG/ACT AERS, Inhale 2 puffs into the lungs daily. (Patient not taking: Reported on 06/30/2020), Disp: 4 g, Rfl: 0   Garner Nash, DO [facility] 06/30/2020 4:07 PM

## 2020-06-30 NOTE — Patient Instructions (Addendum)
Thank you for visiting Dr. Valeta Harms at [facility]. Today we recommend the following:  Switch to DUONEBS, every 6 hours as needed  New orders for DME supplies, nebulizer and tubing.  Flutter valve  Return in about 6 months (around 12/28/2020) for with APP or Dr. Valeta Harms.    Please do your part to reduce the spread of COVID-19.

## 2020-06-30 NOTE — Addendum Note (Signed)
Addended by: Elie Confer on: 06/30/2020 04:55 PM   Modules accepted: Orders

## 2020-07-02 DIAGNOSIS — J439 Emphysema, unspecified: Secondary | ICD-10-CM | POA: Diagnosis not present

## 2020-07-02 DIAGNOSIS — I4891 Unspecified atrial fibrillation: Secondary | ICD-10-CM | POA: Diagnosis not present

## 2020-07-02 DIAGNOSIS — F4321 Adjustment disorder with depressed mood: Secondary | ICD-10-CM | POA: Diagnosis not present

## 2020-07-02 DIAGNOSIS — I10 Essential (primary) hypertension: Secondary | ICD-10-CM | POA: Diagnosis not present

## 2020-07-02 DIAGNOSIS — M1990 Unspecified osteoarthritis, unspecified site: Secondary | ICD-10-CM | POA: Diagnosis not present

## 2020-07-02 DIAGNOSIS — J479 Bronchiectasis, uncomplicated: Secondary | ICD-10-CM | POA: Diagnosis not present

## 2020-07-02 DIAGNOSIS — E782 Mixed hyperlipidemia: Secondary | ICD-10-CM | POA: Diagnosis not present

## 2020-07-07 ENCOUNTER — Telehealth: Payer: Self-pay | Admitting: Pulmonary Disease

## 2020-07-07 DIAGNOSIS — J479 Bronchiectasis, uncomplicated: Secondary | ICD-10-CM

## 2020-07-07 NOTE — Telephone Encounter (Signed)
I spoke with Jeannie the Pharmacist and she took the VO for the duoneb and they will get this out to the pt.

## 2020-07-09 ENCOUNTER — Other Ambulatory Visit: Payer: Self-pay

## 2020-07-09 ENCOUNTER — Encounter: Payer: Self-pay | Admitting: Cardiology

## 2020-07-09 ENCOUNTER — Ambulatory Visit (INDEPENDENT_AMBULATORY_CARE_PROVIDER_SITE_OTHER): Payer: Medicare Other | Admitting: Cardiology

## 2020-07-09 VITALS — BP 136/74 | HR 79 | Ht 62.5 in | Wt 110.6 lb

## 2020-07-09 DIAGNOSIS — I480 Paroxysmal atrial fibrillation: Secondary | ICD-10-CM | POA: Diagnosis not present

## 2020-07-09 NOTE — Progress Notes (Signed)
Electrophysiology Office Note   Date:  07/09/2020   ID:  [name], DOB 17-Jun-1930, MRN [id number]  PCP:  Josetta Huddle, MD  Cardiologist:  Christ Kick Primary Electrophysiologist:  Eddison Searls Meredith Leeds, MD    No chief complaint on file.    History of Present Illness: [name] is a 87 y.o. female who presents today for electrophysiology evaluation.    She has a history of paroxysmal atrial fibrillation on Eliquis.  She had a cardioversion 09/02/2015 which was unsuccessful.  Her flecainide was increased and she was in sinus rhythm when she showed up to the hospital.  She wore a cardiac monitor that showed an 18% PVC burden.  She was switched to amiodarone with improvement in her PVC burden.  Amiodarone has since been stopped due to persistent junctional rhythm.  Today, denies symptoms of palpitations, chest pain, shortness of breath, orthopnea, PND, lower extremity edema, claudication, dizziness, presyncope, syncope, bleeding, or neurologic sequela. The patient is tolerating medications without difficulties.  Since last being seen she has done well.  She has no chest pain or shortness of breath.  She is able do all of her daily activities without restriction.   Past Medical History:  Diagnosis Date  . Bronchiectasis ([place])    with multiple pneumonia back in 1990's  . Bronchitis    episodic  . DJD (degenerative joint disease) of knee   . GERD (gastroesophageal reflux disease)    episodic symptoms  . Hearing loss    chronic moderate hearing loss, worked up by ENT and hearing aids have been recommended  . History of nuclear stress test 11/07   NORMAL  . Insomnia   . PVC's (premature ventricular contractions)    on EKG, controlled on atenolol and caffeine reduction  . Tinnitus    chronic   Past Surgical History:  Procedure Laterality Date  . BREAST EXCISIONAL BIOPSY Left   . CARDIOVERSION N/A 08/31/2015   Procedure: CARDIOVERSION;  Surgeon: Jerline Pain, MD;  Location: [facility]  ENDOSCOPY;  Service: Cardiovascular;  Laterality: N/A;  . cyst removed     left breast     Current Outpatient Medications  Medication Sig Dispense Refill  . apixaban (ELIQUIS) 2.5 MG TABS tablet TAKE 1 TABLET(2.5 MG) BY MOUTH TWICE DAILY 180 tablet 2  . Cholecalciferol (VITAMIN D3) 2000 units TABS Take 1 capsule by mouth daily.    . furosemide (LASIX) 40 MG tablet Take 40 mg by mouth daily as needed.    Marland Kitchen ipratropium-albuterol (DUONEB) 0.5-2.5 (3) MG/3ML SOLN Take 3 mLs by nebulization every 6 (six) hours as needed. 360 mL 11  . Melatonin 10 MG TABS Take by mouth.    . sertraline (ZOLOFT) 50 MG tablet Take 50 mg by mouth daily.     No current facility-administered medications for this visit.    Allergies:   Augmentin [amoxicillin-pot clavulanate], Biaxin [clarithromycin], Metoprolol, and Tequin [gatifloxacin]   Social History:  The patient  reports that she quit smoking about 52 years ago. Her smoking use included cigarettes. She has a 10.00 pack-year smoking history. She has never used smokeless tobacco. She reports current alcohol use. She reports that she does not use drugs.   Family History:  The patient's family history includes Asthma in her sister; Clotting disorder in her sister; Hypertension in her father and mother; Stroke in her father and mother.   ROS:  Please see the history of present illness.   Otherwise, review of systems is positive for none.  All other systems are reviewed and negative.   PHYSICAL EXAM: VS:  BP 136/74   Pulse 79   Ht 5' 2.5" (1.588 m)   Wt 110 lb 9.6 oz (50.2 kg)   SpO2 95%   BMI 19.91 kg/m  , BMI Body mass index is 19.91 kg/m. GEN: Well nourished, well developed, in no acute distress  HEENT: normal  Neck: no JVD, carotid bruits, or masses Cardiac: RRR; no murmurs, rubs, or gallops,no edema  Respiratory:  clear to auscultation bilaterally, normal work of breathing GI: soft, nontender, nondistended, + BS MS: no deformity or atrophy  Skin:  warm and dry Neuro:  Strength and sensation are intact Psych: euthymic mood, full affect  EKG:  EKG is ordered today. Personal review of the ekg ordered shows sinus rhythm, PVC  Recent Labs: 09/25/2019: ALT 12; TSH 4.050 03/29/2020: BUN 14; Creatinine, Ser 0.78; Hemoglobin 13.6; Platelets 171; Potassium 4.7; Sodium 148    Lipid Panel     Component Value Date/Time   CHOL 163 07/03/2015 0440   TRIG 73 07/03/2015 0440   HDL 57 07/03/2015 0440   CHOLHDL 2.9 07/03/2015 0440   VLDL 15 07/03/2015 0440   LDLCALC 91 07/03/2015 0440     Wt Readings from Last 3 Encounters:  07/09/20 110 lb 9.6 oz (50.2 kg)  06/30/20 112 lb 4 oz (50.9 kg)  04/06/20 109 lb (49.4 kg)      Other studies Reviewed: Additional studies/ records that were reviewed today include: TTE 07/03/15  Review of the above records today demonstrates:  - Left ventricle: The cavity size was normal. Systolic function was   normal. The estimated ejection fraction was in the range of 55%   to 60%. Wall motion was normal; there were no regional wall   motion abnormalities.  Holter 12/13/15  NSR, intermittent junctional rhythm.  PACs, PVCs.  Minimum HR 30 at 4AM, presumably during sleep.  No atrial fibrillation.  Holter 02/28/16 Minimum HR: 17 BPM at 3:44:42 AM Maximum HR: 114 BPM at 9:07:34 AM Average HR: 51 BPM 18.2% PVCs 2.9% APCs Sinus rhytnm Zero atrial fibrillation Occasional junctional rhythm during sleeping hours 4.8 second sinus pause during sleeping hours Chest pounding associated with ventricular bigeminy Longest ventriclar run 12 beats  ASSESSMENT AND PLAN:  1.  Paroxysmal atrial fibrillation: Currently on Eliquis with a CHA2DS2-VASc of 3.  Was taken off of amiodarone junctional rhythm.  She is remained in sinus rhythm.  She has no further symptoms from atrial fibrillation.  No changes.   2.  PVCs: Found on cardiac monitor.  Had improved on amiodarone but had significant bradycardia.  Currently  having PVCs but asymptomatic.  No changes.  Current medicines are reviewed at length with the patient today.   The patient has concerns regarding her medicines.  The following changes were made today: None  Labs/ tests ordered today include:  Orders Placed This Encounter  Procedures  . EKG 12-Lead     Disposition:   FU with Cabria Micalizzi 6 months  Signed, Chyann Ambrocio Meredith Leeds, MD  07/09/2020 2:55 PM     [place] [address] [place] [address] [phone] (office) [phone] (fax)

## 2020-08-09 DIAGNOSIS — M1990 Unspecified osteoarthritis, unspecified site: Secondary | ICD-10-CM | POA: Diagnosis not present

## 2020-08-09 DIAGNOSIS — E782 Mixed hyperlipidemia: Secondary | ICD-10-CM | POA: Diagnosis not present

## 2020-08-09 DIAGNOSIS — F4321 Adjustment disorder with depressed mood: Secondary | ICD-10-CM | POA: Diagnosis not present

## 2020-08-09 DIAGNOSIS — J479 Bronchiectasis, uncomplicated: Secondary | ICD-10-CM | POA: Diagnosis not present

## 2020-08-09 DIAGNOSIS — I4891 Unspecified atrial fibrillation: Secondary | ICD-10-CM | POA: Diagnosis not present

## 2020-08-09 DIAGNOSIS — J439 Emphysema, unspecified: Secondary | ICD-10-CM | POA: Diagnosis not present

## 2020-08-09 DIAGNOSIS — I10 Essential (primary) hypertension: Secondary | ICD-10-CM | POA: Diagnosis not present

## 2020-08-30 DIAGNOSIS — H25813 Combined forms of age-related cataract, bilateral: Secondary | ICD-10-CM | POA: Diagnosis not present

## 2020-09-29 ENCOUNTER — Other Ambulatory Visit: Payer: Self-pay

## 2020-09-29 ENCOUNTER — Ambulatory Visit (INDEPENDENT_AMBULATORY_CARE_PROVIDER_SITE_OTHER): Payer: Medicare Other | Admitting: Physician Assistant

## 2020-09-29 ENCOUNTER — Encounter: Payer: Self-pay | Admitting: Physician Assistant

## 2020-09-29 DIAGNOSIS — L578 Other skin changes due to chronic exposure to nonionizing radiation: Secondary | ICD-10-CM

## 2020-09-29 DIAGNOSIS — Z1283 Encounter for screening for malignant neoplasm of skin: Secondary | ICD-10-CM | POA: Diagnosis not present

## 2020-09-29 DIAGNOSIS — D1800 Hemangioma unspecified site: Secondary | ICD-10-CM | POA: Diagnosis not present

## 2020-09-29 DIAGNOSIS — L814 Other melanin hyperpigmentation: Secondary | ICD-10-CM | POA: Diagnosis not present

## 2020-09-29 DIAGNOSIS — L821 Other seborrheic keratosis: Secondary | ICD-10-CM | POA: Diagnosis not present

## 2020-09-29 DIAGNOSIS — H61003 Unspecified perichondritis of external ear, bilateral: Secondary | ICD-10-CM

## 2020-09-30 ENCOUNTER — Encounter: Payer: Self-pay | Admitting: Physician Assistant

## 2020-09-30 NOTE — Progress Notes (Signed)
   Follow-Up Visit   Subjective  [name] is a 87 y.o. female who presents for the following: Skin Problem (Left ear rim- red and tender when she puts pressure on lesion  tx- mupirocin oint. - bx'd last office viist "AK' , New lesion left neck - black spot, right thigh- flat). She picks and rubs her ears also.   The following portions of the chart were reviewed this encounter and updated as appropriate:  Tobacco  Allergies  Meds  Problems  Med Hx  Surg Hx  Fam Hx      Objective  Well appearing patient in no apparent distress; mood and affect are within normal limits.  A full examination was performed including scalp, head, eyes, ears, nose, lips, neck, chest, axillae, abdomen, back, buttocks, bilateral upper extremities, bilateral lower extremities, hands, feet, fingers, toes, fingernails, and toenails. All findings within normal limits unless otherwise noted below.  Objective  Left Mid Helix, Right Mid Helix: Pink scale with hyperkeratosis.   Assessment & Plan  CNH (chondrodermatitis nodularis helicis), bilateral (2) Left Mid Helix; Right Mid Helix  Observe, if any change patient will come back to be seen.   Lentigines - Scattered tan macules - Discussed due to sun exposure - Benign, observe - Call for any changes  Seborrheic Keratoses- back and right thigh - Stuck-on, waxy, tan-brown papules and plaques  - Discussed benign etiology and prognosis. - Observe - Call for any changes  Hemangiomas - Red papules - Discussed benign nature - Observe - Call for any changes  Actinic Damage - diffuse scaly erythematous macules with underlying dyspigmentation - Recommend daily broad spectrum sunscreen SPF 30+ to sun-exposed areas, reapply every 2 hours as needed.  - Call for new or changing lesions.  Skin cancer screening performed today.  I, Dereck Agerton, PA-C, have reviewed all documentation's for this visit.  The documentation on 09/30/20 for the exam,  diagnosis, procedures and orders are all accurate and complete.

## 2020-10-12 DIAGNOSIS — I10 Essential (primary) hypertension: Secondary | ICD-10-CM | POA: Diagnosis not present

## 2020-10-12 DIAGNOSIS — J439 Emphysema, unspecified: Secondary | ICD-10-CM | POA: Diagnosis not present

## 2020-10-12 DIAGNOSIS — J479 Bronchiectasis, uncomplicated: Secondary | ICD-10-CM | POA: Diagnosis not present

## 2020-10-12 DIAGNOSIS — F4321 Adjustment disorder with depressed mood: Secondary | ICD-10-CM | POA: Diagnosis not present

## 2020-10-12 DIAGNOSIS — M1990 Unspecified osteoarthritis, unspecified site: Secondary | ICD-10-CM | POA: Diagnosis not present

## 2020-10-12 DIAGNOSIS — I4891 Unspecified atrial fibrillation: Secondary | ICD-10-CM | POA: Diagnosis not present

## 2020-10-12 DIAGNOSIS — E782 Mixed hyperlipidemia: Secondary | ICD-10-CM | POA: Diagnosis not present

## 2020-11-16 DIAGNOSIS — H903 Sensorineural hearing loss, bilateral: Secondary | ICD-10-CM | POA: Diagnosis not present

## 2020-11-19 DIAGNOSIS — I4891 Unspecified atrial fibrillation: Secondary | ICD-10-CM | POA: Diagnosis not present

## 2020-11-19 DIAGNOSIS — F4321 Adjustment disorder with depressed mood: Secondary | ICD-10-CM | POA: Diagnosis not present

## 2020-11-19 DIAGNOSIS — M1990 Unspecified osteoarthritis, unspecified site: Secondary | ICD-10-CM | POA: Diagnosis not present

## 2020-11-19 DIAGNOSIS — E782 Mixed hyperlipidemia: Secondary | ICD-10-CM | POA: Diagnosis not present

## 2020-11-19 DIAGNOSIS — I10 Essential (primary) hypertension: Secondary | ICD-10-CM | POA: Diagnosis not present

## 2020-11-19 DIAGNOSIS — J439 Emphysema, unspecified: Secondary | ICD-10-CM | POA: Diagnosis not present

## 2020-11-19 DIAGNOSIS — J479 Bronchiectasis, uncomplicated: Secondary | ICD-10-CM | POA: Diagnosis not present

## 2020-12-16 DIAGNOSIS — I83892 Varicose veins of left lower extremities with other complications: Secondary | ICD-10-CM | POA: Diagnosis not present

## 2021-01-11 DIAGNOSIS — I83892 Varicose veins of left lower extremities with other complications: Secondary | ICD-10-CM | POA: Diagnosis not present

## 2021-01-18 DIAGNOSIS — I8312 Varicose veins of left lower extremity with inflammation: Secondary | ICD-10-CM | POA: Diagnosis not present

## 2021-01-18 DIAGNOSIS — I83892 Varicose veins of left lower extremities with other complications: Secondary | ICD-10-CM | POA: Diagnosis not present

## 2021-01-19 ENCOUNTER — Other Ambulatory Visit: Payer: Self-pay

## 2021-01-19 ENCOUNTER — Ambulatory Visit (INDEPENDENT_AMBULATORY_CARE_PROVIDER_SITE_OTHER): Payer: Medicare Other | Admitting: Pulmonary Disease

## 2021-01-19 ENCOUNTER — Encounter: Payer: Self-pay | Admitting: Pulmonary Disease

## 2021-01-19 VITALS — BP 122/64 | HR 50 | Temp 98.1°F | Ht 62.5 in | Wt 119.4 lb

## 2021-01-19 DIAGNOSIS — J449 Chronic obstructive pulmonary disease, unspecified: Secondary | ICD-10-CM

## 2021-01-19 DIAGNOSIS — F4321 Adjustment disorder with depressed mood: Secondary | ICD-10-CM | POA: Diagnosis not present

## 2021-01-19 DIAGNOSIS — E782 Mixed hyperlipidemia: Secondary | ICD-10-CM | POA: Diagnosis not present

## 2021-01-19 DIAGNOSIS — M1990 Unspecified osteoarthritis, unspecified site: Secondary | ICD-10-CM | POA: Diagnosis not present

## 2021-01-19 DIAGNOSIS — J479 Bronchiectasis, uncomplicated: Secondary | ICD-10-CM | POA: Diagnosis not present

## 2021-01-19 DIAGNOSIS — Z2239 Carrier of other specified bacterial diseases: Secondary | ICD-10-CM | POA: Diagnosis not present

## 2021-01-19 DIAGNOSIS — I4891 Unspecified atrial fibrillation: Secondary | ICD-10-CM | POA: Diagnosis not present

## 2021-01-19 DIAGNOSIS — I10 Essential (primary) hypertension: Secondary | ICD-10-CM | POA: Diagnosis not present

## 2021-01-19 DIAGNOSIS — J439 Emphysema, unspecified: Secondary | ICD-10-CM | POA: Diagnosis not present

## 2021-01-19 NOTE — Progress Notes (Signed)
Synopsis: Referred in August 2021 for establish care with new primary pulmonologist, former patient Dr. Lenna Gilford by Josetta Huddle, MD  Subjective:   PATIENT ID: [name] GENDER: female DOB: 1930/09/02, MRN: [id number]  Chief Complaint  Patient presents with   Follow-up    Pt states she has been doing good since last visit and denies any complaints.    Is an 87 year old female with medical history of bronchiectasis and recurrent pneumonia in the 90s.  She has upper lobe and right middle lobe bronchiectasis on CT scan in 2016.  Also had prior cultures positive for Pseudomonas.  Has been followed for treatment of bronchiectasis by Dr. Lenna Gilford for several years.  Currently managed with Advair.  She was supposed to be seen 6 months into the Covid pandemic and stopped coming as she was doing really well.  So here now for establish care.  Patient does have occasional sputum production but otherwise her respiratory symptoms have been stable for the past year.  OV 06/30/20: Since last office visit have been seen by cardiology.  Saw cardiology in April 17, 2020 Dr. Curt Bears.  Currently on Eliquis for PAF.  Flecainide for rhythm control.  Recent cardiac monitor with no atrial fibrillation showing 18% PVC burden.  Has been switched from [place] to [employer].  She is unable to use the Stiolto from a technical standpoint.  The Anoro Ellipta is very expensive currently costing $200 a month.  They have applied for financial assistance.  From respiratory standpoint she feels pretty good has very little sputum production.  Does not have a flutter valve  OV 01/19/2021: Here today for follow-up.  Longstanding history of mild bronchiectasis recurrent pneumonias in the 1990s.  Also has some right middle lobe bronchiectasis on CT imaging in 2016.  Was on inhaled steroids for some time.  Those have been weaned off.  Now using just as needed albuterol.  Pretty much uses her nebulized albuterol once per day.  Able to  keep up with her daily airway clearance techniques.  Still not using a flutter valve.  Overall no exacerbations in the past 6 months since she was last seen in the office.   Past Medical History:  Diagnosis Date   Bronchiectasis ([place])    with multiple pneumonia back in 1990's   Bronchitis    episodic   DJD (degenerative joint disease) of knee    GERD (gastroesophageal reflux disease)    episodic symptoms   Hearing loss    chronic moderate hearing loss, worked up by ENT and hearing aids have been recommended   History of nuclear stress test 11/07   NORMAL   Insomnia    PVC's (premature ventricular contractions)    on EKG, controlled on atenolol and caffeine reduction   Tinnitus    chronic     Family History  Problem Relation Age of Onset   Stroke Father    Hypertension Father    Stroke Mother    Hypertension Mother    Asthma Sister    Clotting disorder Sister    Heart attack Neg Hx      Past Surgical History:  Procedure Laterality Date   BREAST EXCISIONAL BIOPSY Left    CARDIOVERSION N/A 08/31/2015   Procedure: CARDIOVERSION;  Surgeon: Jerline Pain, MD;  Location: [facility] ENDOSCOPY;  Service: Cardiovascular;  Laterality: N/A;   cyst removed     left breast    Social History   Socioeconomic History   Marital status: Divorced  Spouse name: Not on file   Number of children: Not on file   Years of education: Not on file   Highest education level: Not on file  Occupational History   Occupation: retired  Tobacco Use   Smoking status: Former    Packs/day: 0.50    Years: 20.00    Pack years: 10.00    Types: Cigarettes    Quit date: 05/29/1968    Years since quitting: 52.6   Smokeless tobacco: Never  Vaping Use   Vaping Use: Never used  Substance and Sexual Activity   Alcohol use: Yes    Alcohol/week: 0.0 standard drinks    Comment: 1 can of beer at night   Drug use: No   Sexual activity: Not on file  Other Topics Concern   Not on file  Social History Narrative    Not on file   Social Determinants of Health   Financial Resource Strain: Not on file  Food Insecurity: Not on file  Transportation Needs: Not on file  Physical Activity: Not on file  Stress: Not on file  Social Connections: Not on file  Intimate Partner Violence: Not on file     Allergies  Allergen Reactions   Augmentin [Amoxicillin-Pot Clavulanate] Nausea And Vomiting   Biaxin [Clarithromycin] Nausea And Vomiting   Metoprolol Other (See Comments)    Patient notes she "feels funny" after taking it   Tequin [Gatifloxacin] Nausea Only     Outpatient Medications Prior to Visit  Medication Sig Dispense Refill   apixaban (ELIQUIS) 2.5 MG TABS tablet TAKE 1 TABLET(2.5 MG) BY MOUTH TWICE DAILY 180 tablet 2   Cholecalciferol (VITAMIN D3) 2000 units TABS Take 1 capsule by mouth daily.     ipratropium-albuterol (DUONEB) 0.5-2.5 (3) MG/3ML SOLN Take 3 mLs by nebulization every 6 (six) hours as needed. (Patient taking differently: Take 3 mLs by nebulization daily.) 360 mL 11   Melatonin 10 MG TABS Take by mouth.     sertraline (ZOLOFT) 50 MG tablet Take 50 mg by mouth daily.     furosemide (LASIX) 40 MG tablet Take 40 mg by mouth daily as needed. (Patient not taking: No sig reported)     No facility-administered medications prior to visit.    Review of Systems  Constitutional:  Negative for chills, fever, malaise/fatigue and weight loss.  HENT:  Negative for hearing loss, sore throat and tinnitus.   Eyes:  Negative for blurred vision and double vision.  Respiratory:  Positive for cough, sputum production and shortness of breath. Negative for hemoptysis, wheezing and stridor.   Cardiovascular:  Negative for chest pain, palpitations, orthopnea, leg swelling and PND.  Gastrointestinal:  Negative for abdominal pain, constipation, diarrhea, heartburn, nausea and vomiting.  Genitourinary:  Negative for dysuria, hematuria and urgency.  Musculoskeletal:  Negative for joint pain and myalgias.   Skin:  Negative for itching and rash.  Neurological:  Negative for dizziness, tingling, weakness and headaches.  Endo/Heme/Allergies:  Negative for environmental allergies. Does not bruise/bleed easily.  Psychiatric/Behavioral:  Negative for depression. The patient is not nervous/anxious and does not have insomnia.   All other systems reviewed and are negative.   Objective:  Physical Exam Vitals reviewed.  Constitutional:      General: She is not in acute distress.    Appearance: She is well-developed.     Comments: Thin frail elderly female.  HENT:     Head: Normocephalic and atraumatic.  Eyes:     General: No scleral icterus.  Conjunctiva/sclera: Conjunctivae normal.     Pupils: Pupils are equal, round, and reactive to light.  Neck:     Vascular: No JVD.     Trachea: No tracheal deviation.  Cardiovascular:     Rate and Rhythm: Normal rate and regular rhythm.     Heart sounds: Normal heart sounds. No murmur heard. Pulmonary:     Effort: Pulmonary effort is normal. No tachypnea, accessory muscle usage or respiratory distress.     Breath sounds: Normal breath sounds. No stridor. No wheezing, rhonchi or rales.  Abdominal:     General: Bowel sounds are normal. There is no distension.     Palpations: Abdomen is soft.     Tenderness: There is no abdominal tenderness.  Musculoskeletal:        General: No tenderness.     Cervical back: Neck supple.  Lymphadenopathy:     Cervical: No cervical adenopathy.  Skin:    General: Skin is warm and dry.     Capillary Refill: Capillary refill takes less than 2 seconds.     Findings: No rash.  Neurological:     Mental Status: She is alert and oriented to person, place, and time.     Sensory: No sensory deficit.  Psychiatric:        Behavior: Behavior normal.     Vitals:   01/19/21 1457  BP: 122/64  Pulse: (!) 50  Temp: 98.1 F (36.7 C)  TempSrc: Oral  SpO2: 98%  Weight: 119 lb 6.4 oz (54.2 kg)  Height: 5' 2.5" (1.588 m)    98% on RA BMI Readings from Last 3 Encounters:  01/19/21 21.49 kg/m  07/09/20 19.91 kg/m  06/30/20 20.20 kg/m   Wt Readings from Last 3 Encounters:  01/19/21 119 lb 6.4 oz (54.2 kg)  07/09/20 110 lb 9.6 oz (50.2 kg)  06/30/20 112 lb 4 oz (50.9 kg)     CBC    Component Value Date/Time   WBC 6.4 03/29/2020 1435   WBC 4.9 02/23/2016 1427   RBC 4.55 03/29/2020 1435   RBC 4.58 02/23/2016 1427   HGB 13.6 03/29/2020 1435   HCT 41.3 03/29/2020 1435   PLT 171 03/29/2020 1435   MCV 91 03/29/2020 1435   MCH 29.9 03/29/2020 1435   MCH 29.7 02/23/2016 1427   MCHC 32.9 03/29/2020 1435   MCHC 33.9 02/23/2016 1427   RDW 13.1 03/29/2020 1435   LYMPHSABS 1.4 09/07/2017 1501   MONOABS 567 01/13/2016 1302   EOSABS 0.1 09/07/2017 1501   BASOSABS 0.0 09/07/2017 1501    Chest Imaging: CT chest 2016: Bilateral bronchiectasis The patient's images have been independently reviewed by me.    Pulmonary Functions Testing Results: PFT Results Latest Ref Rng & Units 11/10/2014  FVC-Pre L 1.80  FVC-Predicted Pre % 78  FVC-Post L 1.91  FVC-Predicted Post % 83  Pre FEV1/FVC % % 50  Post FEV1/FCV % % 57  FEV1-Pre L 0.90  FEV1-Predicted Pre % 53  FEV1-Post L 1.08  DLCO uncorrected ml/min/mmHg 9.33  DLCO UNC% % 43  DLVA Predicted % 91  TLC L 4.66  TLC % Predicted % 98  RV % Predicted % 117    FeNO:   Pathology:   Echocardiogram:   Heart Catheterization:     Assessment & Plan:     ICD-10-CM   1. Bronchiectasis without complication ([place])  A999333     2. Obstructive lung disease (generalized) ([place])  J44.9     3. Pseudomonas aeruginosa colonization  Z22.39     4. COPD mixed type ([place])  J44.9       Discussion:  This is an 87 year old female, history of bronchiectasis, prior Pseudomonas with cultures, prior AFB negative.  Had been on ICS/LABA for a long time, switched to Anoro.  This was very expensive for her.  We moved back to just using nebulized albuterol daily plus  ipratropium.  Plan: Continue duo nebs as needed and at least once a day. Stopped the inhaler last time due to cost. She does not have a flutter valve.  But I showed her up picture 1 online that she could purchase.  We discussed this with her daughter if she starts to have recurrent sputum production that is difficult to clear. Patient is agreeable with this plan.  Return to clinic in 1 year or as needed    Current Outpatient Medications:    apixaban (ELIQUIS) 2.5 MG TABS tablet, TAKE 1 TABLET(2.5 MG) BY MOUTH TWICE DAILY, Disp: 180 tablet, Rfl: 2   Cholecalciferol (VITAMIN D3) 2000 units TABS, Take 1 capsule by mouth daily., Disp: , Rfl:    ipratropium-albuterol (DUONEB) 0.5-2.5 (3) MG/3ML SOLN, Take 3 mLs by nebulization every 6 (six) hours as needed. (Patient taking differently: Take 3 mLs by nebulization daily.), Disp: 360 mL, Rfl: 11   Melatonin 10 MG TABS, Take by mouth., Disp: , Rfl:    sertraline (ZOLOFT) 50 MG tablet, Take 50 mg by mouth daily., Disp: , Rfl:    Garner Nash, DO [facility] 01/19/2021 3:06 PM

## 2021-01-19 NOTE — Patient Instructions (Signed)
Thank you for visiting Dr. Valeta Harms at [facility]. Today we recommend the following:  Please continue albuterol nebs daily and as needed   Return in about 1 year (around 01/19/2022) for w/  Dr. Valeta Harms.    Please do your part to reduce the spread of COVID-19.

## 2021-01-25 DIAGNOSIS — H903 Sensorineural hearing loss, bilateral: Secondary | ICD-10-CM | POA: Diagnosis not present

## 2021-01-25 DIAGNOSIS — Z461 Encounter for fitting and adjustment of hearing aid: Secondary | ICD-10-CM | POA: Diagnosis not present

## 2021-02-08 ENCOUNTER — Other Ambulatory Visit: Payer: Self-pay | Admitting: Cardiology

## 2021-02-15 ENCOUNTER — Ambulatory Visit: Payer: Medicare Other | Admitting: Cardiology

## 2021-02-17 DIAGNOSIS — R197 Diarrhea, unspecified: Secondary | ICD-10-CM | POA: Diagnosis not present

## 2021-02-17 DIAGNOSIS — J029 Acute pharyngitis, unspecified: Secondary | ICD-10-CM | POA: Diagnosis not present

## 2021-03-14 DIAGNOSIS — Z23 Encounter for immunization: Secondary | ICD-10-CM | POA: Diagnosis not present

## 2021-04-07 ENCOUNTER — Encounter: Payer: Self-pay | Admitting: Cardiology

## 2021-04-07 ENCOUNTER — Ambulatory Visit (INDEPENDENT_AMBULATORY_CARE_PROVIDER_SITE_OTHER): Payer: Medicare Other | Admitting: Cardiology

## 2021-04-07 ENCOUNTER — Other Ambulatory Visit: Payer: Self-pay

## 2021-04-07 VITALS — BP 110/68 | HR 69 | Ht 62.0 in | Wt 121.0 lb

## 2021-04-07 DIAGNOSIS — I480 Paroxysmal atrial fibrillation: Secondary | ICD-10-CM | POA: Diagnosis not present

## 2021-04-07 DIAGNOSIS — Z79899 Other long term (current) drug therapy: Secondary | ICD-10-CM | POA: Diagnosis not present

## 2021-04-07 NOTE — Progress Notes (Signed)
Electrophysiology Office Note   Date:  04/07/2021   ID:  [name], DOB 1930/11/28, MRN [id number]  PCP:  Bethany Huddle, MD  Cardiologist:  [name] Primary Electrophysiologist:  [name] Bethany Leeds, MD    No chief complaint on file.    History of Present Illness: [name] is a 87 y.o. female who presents today for electrophysiology evaluation.    She has a history significant for paroxysmal atrial fibrillation and PVCs.  She is status post cardioversion for 617 which was unsuccessful.  Her flecainide was increased and she was in sinus rhythm when she came to the hospital.  She wore a cardiac monitor that showed an 18% PVC burden.  She was switched to amiodarone with improvement in her PVCs.  Unfortunately amiodarone was stopped due to junctional rhythm.  Today, denies symptoms of palpitations, chest Mcdonald, shortness of breath, orthopnea, PND, lower extremity edema, claudication, dizziness, presyncope, syncope, bleeding, or neurologic sequela. The patient is tolerating medications without difficulties.  Overall she is feeling well.  She has noted no further palpitations.  She is able to do all of her daily activities.  She is overall happy with how she has been feeling.  She has not had any issues since last being seen.   Past Medical History:  Diagnosis Date   Bronchiectasis ([place])    with multiple pneumonia back in 1990's   Bronchitis    episodic   DJD (degenerative joint disease) of knee    GERD (gastroesophageal reflux disease)    episodic symptoms   Hearing loss    chronic moderate hearing loss, worked up by ENT and hearing aids have been recommended   History of nuclear stress test 11/07   NORMAL   Insomnia    PVC's (premature ventricular contractions)    on EKG, controlled on atenolol and caffeine reduction   Tinnitus    chronic   Past Surgical History:  Procedure Laterality Date   BREAST EXCISIONAL BIOPSY Left    CARDIOVERSION N/A 08/31/2015   Procedure:  CARDIOVERSION;  Surgeon: Bethany Pain, MD;  Location: [facility];  Service: Cardiovascular;  Laterality: N/A;   cyst removed     left breast     Current Outpatient Medications  Medication Sig Dispense Refill   apixaban (ELIQUIS) 2.5 MG TABS tablet TAKE 1 TABLET(2.5 MG) BY MOUTH TWICE DAILY 180 tablet 2   Cholecalciferol (VITAMIN D3) 2000 units TABS Take 1 capsule by mouth daily.     ipratropium-albuterol (DUONEB) 0.5-2.5 (3) MG/3ML SOLN Take 3 mLs by nebulization every 6 (six) hours as needed. (Patient taking differently: Take 3 mLs by nebulization daily.) 360 mL 11   Melatonin 10 MG TABS Take by mouth.     sertraline (ZOLOFT) 50 MG tablet Take 50 mg by mouth daily.     No current facility-administered medications for this visit.    Allergies:   Augmentin [amoxicillin-pot clavulanate], Biaxin [clarithromycin], Metoprolol, and Tequin [gatifloxacin]   Social History:  The patient  reports that she quit smoking about 52 years ago. Her smoking use included cigarettes. She has a 10.00 pack-year smoking history. She has never used smokeless tobacco. She reports current alcohol use. She reports that she does not use drugs.   Family History:  The patient's family history includes Asthma in her sister; Clotting disorder in her sister; Hypertension in her father and mother; Stroke in her father and mother.   ROS:  Please see the history of present illness.   Otherwise, review of  systems is positive for none.   All other systems are reviewed and negative.   PHYSICAL EXAM: VS:  BP 110/68   Pulse 69   Ht 5\' 2"  (1.575 m)   Wt 121 lb (54.9 kg)   SpO2 97%   BMI 22.13 kg/m  , BMI Body mass index is 22.13 kg/m. GEN: Well nourished, well developed, in no acute distress  HEENT: normal  Neck: no JVD, carotid bruits, or masses Cardiac: RRR; no murmurs, rubs, or gallops,no edema  Respiratory:  clear to auscultation bilaterally, normal work of breathing GI: soft, nontender, nondistended, + BS MS:  no deformity or atrophy  Skin: warm and dry Neuro:  Strength and sensation are intact Psych: euthymic mood, full affect  EKG:  EKG is ordered today. Personal review of the ekg ordered shows sinus rhythm, rate 69  Recent Labs: No results found for requested labs within last 8760 hours.    Lipid Panel     Component Value Date/Time   CHOL 163 07/03/2015 0440   TRIG 73 07/03/2015 0440   HDL 57 07/03/2015 0440   CHOLHDL 2.9 07/03/2015 0440   VLDL 15 07/03/2015 0440   LDLCALC 91 07/03/2015 0440     Wt Readings from Last 3 Encounters:  04/07/21 121 lb (54.9 kg)  01/19/21 119 lb 6.4 oz (54.2 kg)  07/09/20 110 lb 9.6 oz (50.2 kg)      Other studies Reviewed: Additional studies/ records that were reviewed today include: TTE 07/03/15  Review of the above records today demonstrates:  - Left ventricle: The cavity size was normal. Systolic function was   normal. The estimated ejection fraction was in the range of 55%   to 60%. Wall motion was normal; there were no regional wall   motion abnormalities.  Holter 12/13/15 NSR, intermittent junctional rhythm. PACs, PVCs. Minimum HR 30 at 4AM, presumably during sleep. No atrial fibrillation.  Holter 02/28/16 Minimum HR: 17 BPM at 3:44:42 AM Maximum HR: 114 BPM at 9:07:34 AM Average HR: 51 BPM 18.2% PVCs 2.9% APCs Sinus rhytnm Zero atrial fibrillation Occasional junctional rhythm during sleeping hours 4.8 second sinus pause during sleeping hours Chest pounding associated with ventricular bigeminy Longest ventriclar run 12 beats  ASSESSMENT AND PLAN:  1.  Paroxysmal atrial fibrillation: Currently on Eliquis 2.5 mg twice daily.  CHA2DS2-VASc of 3.  Was taken off of amiodarone due to junctional rhythm.  She has remained in sinus rhythm.  We [name] continue with current management.  We [name] check labs today for Eliquis monitoring.  2.  PVCs: Found on cardiac monitor.  Was improved on amiodarone, though after stopping amiodarone they  have returned.  She is minimally symptomatic.  No changes.  Current medicines are reviewed at length with the patient today.   The patient has concerns regarding her medicines.  The following changes were made today: None  Labs/ tests ordered today include:  Orders Placed This Encounter  Procedures   Basic metabolic panel   CBC   EKG 12-Lead      Disposition:   FU with Tommye Standard 12 months  Signed, Moriah Loughry Bethany Leeds, MD  04/07/2021 3:04 PM     [facility] [address] [place] [address] [phone] (office) [phone] (fax)

## 2021-04-07 NOTE — Patient Instructions (Addendum)
Medication Instructions:  Your physician recommends that you continue on your current medications as directed. Please refer to the Current Medication list given to you today.  *If you need a refill on your cardiac medications before your next appointment, please call your pharmacy*   Lab Work: Today: BMET & CBC If you have labs (blood work) drawn today and your tests are completely normal, you will receive your results only by: [place] (if you have MyChart) OR A paper copy in the mail If you have any lab test that is abnormal or we need to change your treatment, we will call you to review the results.   Testing/Procedures: None ordered   Follow-Up: At [facility], you and your health needs are our priority.  As part of our continuing mission to provide you with exceptional heart care, we have created designated Provider Care Teams.  These Care Teams include your primary Cardiologist (physician) and Advanced Practice Providers (APPs -  Physician Assistants and Nurse Practitioners) who all work together to provide you with the care you need, when you need it.  Your next appointment:   1 year(s)  The format for your next appointment:   In Person  Provider:   Tommye Standard, PA-C    Thank you for choosing [facility]!!   Trinidad Curet, RN [phone]

## 2021-04-08 LAB — BASIC METABOLIC PANEL
BUN/Creatinine Ratio: 20 (ref 12–28)
BUN: 14 mg/dL (ref 8–27)
CO2: 25 mmol/L (ref 20–29)
Calcium: 9.1 mg/dL (ref 8.7–10.3)
Chloride: 104 mmol/L (ref 96–106)
Creatinine, Ser: 0.7 mg/dL (ref 0.57–1.00)
Glucose: 81 mg/dL (ref 70–99)
Potassium: 4.6 mmol/L (ref 3.5–5.2)
Sodium: 144 mmol/L (ref 134–144)
eGFR: 83 mL/min/{1.73_m2} (ref >59)

## 2021-04-08 LAB — CBC
Hematocrit: 39.3 % (ref 34.0–46.6)
Hemoglobin: 13.2 g/dL (ref 11.1–15.9)
MCH: 30 pg (ref 26.6–33.0)
MCHC: 33.6 g/dL (ref 31.5–35.7)
MCV: 89 fL (ref 79–97)
Platelets: 179 10*3/uL (ref 150–450)
RBC: 4.4 x10E6/uL (ref 3.77–5.28)
RDW: 13.5 % (ref 11.7–15.4)
WBC: 5.2 10*3/uL (ref 3.4–10.8)

## 2021-06-27 DIAGNOSIS — F4321 Adjustment disorder with depressed mood: Secondary | ICD-10-CM | POA: Diagnosis not present

## 2021-06-27 DIAGNOSIS — J439 Emphysema, unspecified: Secondary | ICD-10-CM | POA: Diagnosis not present

## 2021-06-27 DIAGNOSIS — E782 Mixed hyperlipidemia: Secondary | ICD-10-CM | POA: Diagnosis not present

## 2021-06-27 DIAGNOSIS — Z23 Encounter for immunization: Secondary | ICD-10-CM | POA: Diagnosis not present

## 2021-06-27 DIAGNOSIS — D6869 Other thrombophilia: Secondary | ICD-10-CM | POA: Diagnosis not present

## 2021-06-27 DIAGNOSIS — E559 Vitamin D deficiency, unspecified: Secondary | ICD-10-CM | POA: Diagnosis not present

## 2021-06-27 DIAGNOSIS — I10 Essential (primary) hypertension: Secondary | ICD-10-CM | POA: Diagnosis not present

## 2021-06-27 DIAGNOSIS — F419 Anxiety disorder, unspecified: Secondary | ICD-10-CM | POA: Diagnosis not present

## 2021-06-27 DIAGNOSIS — J479 Bronchiectasis, uncomplicated: Secondary | ICD-10-CM | POA: Diagnosis not present

## 2021-06-27 DIAGNOSIS — G4700 Insomnia, unspecified: Secondary | ICD-10-CM | POA: Diagnosis not present

## 2021-06-27 DIAGNOSIS — I4891 Unspecified atrial fibrillation: Secondary | ICD-10-CM | POA: Diagnosis not present

## 2021-06-27 DIAGNOSIS — Z0000 Encounter for general adult medical examination without abnormal findings: Secondary | ICD-10-CM | POA: Diagnosis not present

## 2021-06-27 DIAGNOSIS — M1990 Unspecified osteoarthritis, unspecified site: Secondary | ICD-10-CM | POA: Diagnosis not present

## 2021-09-14 ENCOUNTER — Ambulatory Visit
Admission: RE | Admit: 2021-09-14 | Discharge: 2021-09-14 | Disposition: A | Discharge status: Home / Self Care | Payer: Medicare Other | Source: Ambulatory Visit | Attending: Internal Medicine | Admitting: Internal Medicine

## 2021-09-14 ENCOUNTER — Other Ambulatory Visit: Payer: Self-pay | Admitting: Internal Medicine

## 2021-09-14 DIAGNOSIS — S92351A Displaced fracture of fifth metatarsal bone, right foot, initial encounter for closed fracture: Secondary | ICD-10-CM | POA: Diagnosis not present

## 2021-09-14 DIAGNOSIS — M79671 Pain in right foot: Secondary | ICD-10-CM

## 2021-09-14 DIAGNOSIS — M7989 Other specified soft tissue disorders: Secondary | ICD-10-CM | POA: Diagnosis not present

## 2021-09-19 DIAGNOSIS — M25571 Pain in right ankle and joints of right foot: Secondary | ICD-10-CM | POA: Diagnosis not present

## 2021-09-19 DIAGNOSIS — S92301A Fracture of unspecified metatarsal bone(s), right foot, initial encounter for closed fracture: Secondary | ICD-10-CM | POA: Diagnosis not present

## 2021-09-29 ENCOUNTER — Encounter: Payer: Self-pay | Admitting: Physician Assistant

## 2021-09-29 ENCOUNTER — Ambulatory Visit (INDEPENDENT_AMBULATORY_CARE_PROVIDER_SITE_OTHER): Payer: Medicare Other | Admitting: Physician Assistant

## 2021-09-29 DIAGNOSIS — Z1283 Encounter for screening for malignant neoplasm of skin: Secondary | ICD-10-CM | POA: Diagnosis not present

## 2021-09-29 DIAGNOSIS — L821 Other seborrheic keratosis: Secondary | ICD-10-CM | POA: Diagnosis not present

## 2021-10-05 ENCOUNTER — Encounter: Payer: Self-pay | Admitting: Physician Assistant

## 2021-10-05 NOTE — Progress Notes (Signed)
? ?  Follow-Up Visit ?  ?Subjective  ?[name] is a 88 y.o. female who presents for the following: Annual Exam (No new concerns. No personal or family history of melanoma or non mole skin cancer. ). ? ? ?The following portions of the chart were reviewed this encounter and updated as appropriate:  Tobacco  Allergies  Meds  Problems  Med Hx  Surg Hx  Fam Hx   ?  ? ?Objective  ?Well appearing patient in no apparent distress; mood and affect are within normal limits. ? ?A full examination was performed including scalp, head, eyes, ears, nose, lips, neck, chest, axillae, abdomen, back, buttocks, bilateral upper extremities, bilateral lower extremities, hands, feet, fingers, toes, fingernails, and toenails. All findings within normal limits unless otherwise noted below. ? ?Full body skin examination- No atypical nevi or signs of NMSC noted at the time of the visit.  ? ?numerous scattered ?Stuck-on, waxy, tan-brown papules and plaques. --Discussed benign etiology and prognosis.  ? ? ?Assessment & Plan  ?Encounter for screening for malignant neoplasm of skin ? ?Yearly skin check ? ?Seborrheic keratosis ?numerous scattered ? ?Okay to leave if stable ? ? ? ?I, Eriona Kinchen, PA-C, have reviewed all documentation's for this visit.  The documentation on 10/05/21 for the exam, diagnosis, procedures and orders are all accurate and complete. ?

## 2021-10-07 ENCOUNTER — Encounter: Payer: Self-pay | Admitting: Pulmonary Disease

## 2021-10-07 DIAGNOSIS — J479 Bronchiectasis, uncomplicated: Secondary | ICD-10-CM

## 2021-10-07 MED ORDER — IPRATROPIUM-ALBUTEROL 0.5-2.5 (3) MG/3ML IN SOLN: 3.0000 mL | Freq: Four times a day (QID) | RESPIRATORY_TRACT | 11 refills | Status: DC | PRN

## 2021-10-17 DIAGNOSIS — S92354A Nondisplaced fracture of fifth metatarsal bone, right foot, initial encounter for closed fracture: Secondary | ICD-10-CM | POA: Diagnosis not present

## 2021-10-28 DIAGNOSIS — H25813 Combined forms of age-related cataract, bilateral: Secondary | ICD-10-CM | POA: Diagnosis not present

## 2021-12-02 DIAGNOSIS — S92354D Nondisplaced fracture of fifth metatarsal bone, right foot, subsequent encounter for fracture with routine healing: Secondary | ICD-10-CM | POA: Diagnosis not present

## 2021-12-06 DIAGNOSIS — L03119 Cellulitis of unspecified part of limb: Secondary | ICD-10-CM | POA: Diagnosis not present

## 2021-12-08 DIAGNOSIS — L03115 Cellulitis of right lower limb: Secondary | ICD-10-CM | POA: Diagnosis not present

## 2021-12-12 DIAGNOSIS — L03115 Cellulitis of right lower limb: Secondary | ICD-10-CM | POA: Diagnosis not present

## 2022-01-12 ENCOUNTER — Ambulatory Visit (INDEPENDENT_AMBULATORY_CARE_PROVIDER_SITE_OTHER): Payer: Medicare Other | Admitting: Pulmonary Disease

## 2022-01-12 ENCOUNTER — Encounter: Payer: Self-pay | Admitting: Pulmonary Disease

## 2022-01-12 VITALS — BP 100/70 | HR 114 | Ht 62.0 in | Wt 120.4 lb

## 2022-01-12 DIAGNOSIS — J479 Bronchiectasis, uncomplicated: Secondary | ICD-10-CM

## 2022-01-12 DIAGNOSIS — Z2239 Carrier of other specified bacterial diseases: Secondary | ICD-10-CM | POA: Diagnosis not present

## 2022-01-12 DIAGNOSIS — J449 Chronic obstructive pulmonary disease, unspecified: Secondary | ICD-10-CM

## 2022-01-12 MED ORDER — IPRATROPIUM-ALBUTEROL 0.5-2.5 (3) MG/3ML IN SOLN: 3.0000 mL | Freq: Four times a day (QID) | RESPIRATORY_TRACT | 11 refills | Status: DC | PRN

## 2022-01-12 NOTE — Addendum Note (Signed)
Addended by: Alvin Critchley on: 01/12/2022 05:02 PM   Modules accepted: Orders

## 2022-01-12 NOTE — Progress Notes (Signed)
Synopsis: Referred in August 2021 for establish care with new primary pulmonologist, former patient Dr. Lenna Gilford by Josetta Huddle, MD  Subjective:   PATIENT ID: Bethany Ras GENDER: female DOB: November 03, 1930, MRN: [id number]  Chief Complaint  Patient presents with   Follow-up    Follow-up    Is an 88 year old female with medical history of bronchiectasis and recurrent pneumonia in the 90s.  She has upper lobe and right middle lobe bronchiectasis on CT scan in 2016.  Also had prior cultures positive for Pseudomonas.  Has been followed for treatment of bronchiectasis by Dr. Lenna Gilford for several years.  Currently managed with Advair.  She was supposed to be seen 6 months into the Covid pandemic and stopped coming as she was doing really well.  So here now for establish care.  Patient does have occasional sputum production but otherwise her respiratory symptoms have been stable for the past year.  OV 06/30/20: Since last office visit have been seen by cardiology.  Saw cardiology in April 17, 2020 Dr. Curt Bears.  Currently on Eliquis for PAF.  Flecainide for rhythm control.  Recent cardiac monitor with no atrial fibrillation showing 18% PVC burden.  Has been switched from [place] to [employer].  She is unable to use the Stiolto from a technical standpoint.  The Anoro Ellipta is very expensive currently costing $200 a month.  They have applied for financial assistance.  From respiratory standpoint she feels pretty good has very little sputum production.  Does not have a flutter valve  OV 01/19/2021: Here today for follow-up.  Longstanding history of mild bronchiectasis recurrent pneumonias in the 1990s.  Also has some right middle lobe bronchiectasis on CT imaging in 2016.  Was on inhaled steroids for some time.  Those have been weaned off.  Now using just as needed albuterol.  Pretty much uses her nebulized albuterol once per day.  Able to keep up with her daily airway clearance techniques.  Still not using a  flutter valve.  Overall no exacerbations in the past 6 months since she was last seen in the office.  OV 01/12/2022: Here today for follow-up.  Longstanding history of mild bronchiectasis with recurrent exacerbations and pneumonias in the 1990s.  Doing much better since then.  No issues this past year.  Off of inhaled steroids.  Was using Anoro for short time but the medication cost too much we switched to DuoNebs.  She is doing well with doing the DuoNebs at least once a day.  Not really using a flutter valve but not also not having any significant sputum production at this time.  She does need refills for DuoNebs.    Past Medical History:  Diagnosis Date   Bronchiectasis ([place])    with multiple pneumonia back in 1990's   Bronchitis    episodic   DJD (degenerative joint disease) of knee    GERD (gastroesophageal reflux disease)    episodic symptoms   Hearing loss    chronic moderate hearing loss, worked up by ENT and hearing aids have been recommended   History of nuclear stress test 11/07   NORMAL   Insomnia    PVC's (premature ventricular contractions)    on EKG, controlled on atenolol and caffeine reduction   Tinnitus    chronic     Family History  Problem Relation Age of Onset   Stroke Father    Hypertension Father    Stroke Mother    Hypertension Mother    Asthma Sister  Clotting disorder Sister    Heart attack Neg Hx      Past Surgical History:  Procedure Laterality Date   BREAST EXCISIONAL BIOPSY Left    CARDIOVERSION N/A 08/31/2015   Procedure: CARDIOVERSION;  Surgeon: Jerline Pain, MD;  Location: [facility] ENDOSCOPY;  Service: Cardiovascular;  Laterality: N/A;   cyst removed     left breast    Social History   Socioeconomic History   Marital status: Divorced    Spouse name: Not on file   Number of children: Not on file   Years of education: Not on file   Highest education level: Not on file  Occupational History   Occupation: retired  Tobacco Use   Smoking  status: Former    Packs/day: 0.50    Years: 20.00    Total pack years: 10.00    Types: Cigarettes    Quit date: 05/29/1968    Years since quitting: 53.6   Smokeless tobacco: Never  Vaping Use   Vaping Use: Never used  Substance and Sexual Activity   Alcohol use: Yes    Alcohol/week: 0.0 standard drinks of alcohol    Comment: 1 can of beer at night   Drug use: No   Sexual activity: Not on file  Other Topics Concern   Not on file  Social History Narrative   Not on file   Social Determinants of Health   Financial Resource Strain: Not on file  Food Insecurity: Not on file  Transportation Needs: Not on file  Physical Activity: Not on file  Stress: Not on file  Social Connections: Not on file  Intimate Partner Violence: Not on file     Allergies  Allergen Reactions   Augmentin [Amoxicillin-Pot Clavulanate] Nausea And Vomiting   Biaxin [Clarithromycin] Nausea And Vomiting   Metoprolol Other (See Comments)    Patient notes she "feels funny" after taking it   Tequin [Gatifloxacin] Nausea Only     Outpatient Medications Prior to Visit  Medication Sig Dispense Refill   ipratropium-albuterol (DUONEB) 0.5-2.5 (3) MG/3ML SOLN Take 3 mLs by nebulization every 6 (six) hours as needed. 360 mL 11   apixaban (ELIQUIS) 2.5 MG TABS tablet TAKE 1 TABLET(2.5 MG) BY MOUTH TWICE DAILY 180 tablet 2   Cholecalciferol (VITAMIN D3) 2000 units TABS Take 1 capsule by mouth daily.     Melatonin 10 MG TABS Take by mouth.     sertraline (ZOLOFT) 50 MG tablet Take 50 mg by mouth daily.     No facility-administered medications prior to visit.    Review of Systems  Constitutional:  Negative for chills, fever, malaise/fatigue and weight loss.  HENT:  Negative for hearing loss, sore throat and tinnitus.   Eyes:  Negative for blurred vision and double vision.  Respiratory:  Positive for cough. Negative for hemoptysis, sputum production, shortness of breath, wheezing and stridor.   Cardiovascular:   Negative for chest pain, palpitations, orthopnea, leg swelling and PND.  Gastrointestinal:  Negative for abdominal pain, constipation, diarrhea, heartburn, nausea and vomiting.  Genitourinary:  Negative for dysuria, hematuria and urgency.  Musculoskeletal:  Negative for joint pain and myalgias.  Skin:  Negative for itching and rash.  Neurological:  Negative for dizziness, tingling, weakness and headaches.  Endo/Heme/Allergies:  Negative for environmental allergies. Does not bruise/bleed easily.  Psychiatric/Behavioral:  Negative for depression. The patient is not nervous/anxious and does not have insomnia.   All other systems reviewed and are negative.    Objective:  Physical Exam Vitals reviewed.  Constitutional:      General: She is not in acute distress.    Appearance: She is well-developed.  HENT:     Head: Normocephalic and atraumatic.     Mouth/Throat:     Pharynx: No oropharyngeal exudate.  Eyes:     Conjunctiva/sclera: Conjunctivae normal.     Pupils: Pupils are equal, round, and reactive to light.  Neck:     Vascular: No JVD.     Trachea: No tracheal deviation.     Comments: Loss of supraclavicular fat Cardiovascular:     Rate and Rhythm: Normal rate. Rhythm irregular.     Heart sounds: S1 normal and S2 normal.     Comments: Distant heart tones Pulmonary:     Effort: No tachypnea or accessory muscle usage.     Breath sounds: No stridor. Decreased breath sounds (throughout all lung fields) present. No wheezing, rhonchi or rales.  Abdominal:     General: Bowel sounds are normal. There is no distension.     Palpations: Abdomen is soft.     Tenderness: There is no abdominal tenderness.  Musculoskeletal:        General: No deformity (muscle wasting ).  Skin:    General: Skin is warm and dry.     Capillary Refill: Capillary refill takes less than 2 seconds.     Findings: No rash.  Neurological:     Mental Status: She is alert and oriented to person, place, and time.   Psychiatric:        Behavior: Behavior normal.      Vitals:   01/12/22 1634  BP: 100/70  Pulse: (!) 114  SpO2: 97%  Weight: 120 lb 6.4 oz (54.6 kg)  Height: '5\' 2"'$  (1.575 m)   97% on RA BMI Readings from Last 3 Encounters:  01/12/22 22.02 kg/m  04/07/21 22.13 kg/m  01/19/21 21.49 kg/m   Wt Readings from Last 3 Encounters:  01/12/22 120 lb 6.4 oz (54.6 kg)  04/07/21 121 lb (54.9 kg)  01/19/21 119 lb 6.4 oz (54.2 kg)     CBC    Component Value Date/Time   WBC 5.2 04/07/2021 1456   WBC 4.9 02/23/2016 1427   RBC 4.40 04/07/2021 1456   RBC 4.58 02/23/2016 1427   HGB 13.2 04/07/2021 1456   HCT 39.3 04/07/2021 1456   PLT 179 04/07/2021 1456   MCV 89 04/07/2021 1456   MCH 30.0 04/07/2021 1456   MCH 29.7 02/23/2016 1427   MCHC 33.6 04/07/2021 1456   MCHC 33.9 02/23/2016 1427   RDW 13.5 04/07/2021 1456   LYMPHSABS 1.4 09/07/2017 1501   MONOABS 567 01/13/2016 1302   EOSABS 0.1 09/07/2017 1501   BASOSABS 0.0 09/07/2017 1501    Chest Imaging: CT chest 2016: Bilateral bronchiectasis The patient's images have been independently reviewed by me.    Pulmonary Functions Testing Results:    Latest Ref Rng & Units 11/10/2014    3:51 PM  PFT Results  FVC-Pre L 1.80   FVC-Predicted Pre % 78   FVC-Post L 1.91   FVC-Predicted Post % 83   Pre FEV1/FVC % % 50   Post FEV1/FCV % % 57   FEV1-Pre L 0.90   FEV1-Predicted Pre % 53   FEV1-Post L 1.08   DLCO uncorrected ml/min/mmHg 9.33   DLCO UNC% % 43   DLVA Predicted % 91   TLC L 4.66   TLC % Predicted % 98   RV % Predicted % 117  FeNO:   Pathology:   Echocardiogram:   Heart Catheterization:     Assessment & Plan:     ICD-10-CM   1. Obstructive lung disease (generalized) ([place])  J44.9     2. Bronchiectasis without complication (HCC)  R48.5 ipratropium-albuterol (DUONEB) 0.5-2.5 (3) MG/3ML SOLN    3. Pseudomonas aeruginosa colonization  Z22.39     4. COPD mixed type ([place])  J44.9        Discussion:  This is an 88 year old female, history of bronchiectasis, prior Pseudomonas cultures positive, prior AFB negative, was on an ICS/LABA for a long time switched to Anoro but the inhalers cost too much so she dropped a DuoNebs once a day.  She has been doing really well with that.  Plan: Continue DuoNebs once a day If she needs an inhaler at some point time we can consider moving stuff around. But otherwise continue just the nebulizer. Continue flutter valve if needed.  She does not really like using it so has not used it in the past. She is can follow-up with [place] in 1 year or as needed.    Current Outpatient Medications:    ipratropium-albuterol (DUONEB) 0.5-2.5 (3) MG/3ML SOLN, Take 3 mLs by nebulization every 6 (six) hours as needed., Disp: 360 mL, Rfl: 11   apixaban (ELIQUIS) 2.5 MG TABS tablet, TAKE 1 TABLET(2.5 MG) BY MOUTH TWICE DAILY, Disp: 180 tablet, Rfl: 2   Cholecalciferol (VITAMIN D3) 2000 units TABS, Take 1 capsule by mouth daily., Disp: , Rfl:    Melatonin 10 MG TABS, Take by mouth., Disp: , Rfl:    sertraline (ZOLOFT) 50 MG tablet, Take 50 mg by mouth daily., Disp: , Rfl:    Garner Nash, DO [facility] 01/12/2022 4:47 PM

## 2022-01-12 NOTE — Patient Instructions (Signed)
Thank you for visiting Dr. Valeta Harms at [facility]. Today we recommend the following:  Meds ordered this encounter  Medications   ipratropium-albuterol (DUONEB) 0.5-2.5 (3) MG/3ML SOLN    Sig: Take 3 mLs by nebulization every 6 (six) hours as needed.    Dispense:  360 mL    Refill:  11   Continue current regimen   Return in about 1 year (around 01/13/2023) for with APP or Dr. Valeta Harms.    Please do your part to reduce the spread of COVID-19.

## 2022-01-23 DIAGNOSIS — R053 Chronic cough: Secondary | ICD-10-CM | POA: Diagnosis not present

## 2022-01-23 DIAGNOSIS — J439 Emphysema, unspecified: Secondary | ICD-10-CM | POA: Diagnosis not present

## 2022-01-23 DIAGNOSIS — Z03818 Encounter for observation for suspected exposure to other biological agents ruled out: Secondary | ICD-10-CM | POA: Diagnosis not present

## 2022-01-23 DIAGNOSIS — J069 Acute upper respiratory infection, unspecified: Secondary | ICD-10-CM | POA: Diagnosis not present

## 2022-04-11 DIAGNOSIS — Z23 Encounter for immunization: Secondary | ICD-10-CM | POA: Diagnosis not present

## 2022-04-28 ENCOUNTER — Ambulatory Visit: Payer: Medicare Other | Admitting: Physician Assistant

## 2022-05-28 NOTE — Progress Notes (Signed)
Cardiology Office Note Date:  05/28/2022  Patient ID:  [name] December 06, 1930, MRN [id number] PCP:  Marden Noble, MD  Cardiologist:  Dr. Eldridge Dace Electrophysiologist: Dr. Elberta Fortis Pulmonary: Dr. Tonia Brooms    Chief Complaint:   annual EP visit  History of Present Illness: [name] is a 88 y.o. female with history of bronchiectasis, PVCs, AFib  She comes in today to be seen for Dr. Elberta Fortis, last seen by him April 2021, doing well, planned for [facility] labs.   I saw her Nov 2021 She is accompanied by her daughter. She is doing fairly well.  Recently had some adjustment to her inhaler by dr. Tonia Brooms and since seems to have noted for a couple months an unusual punding heart beat. Not necessarily fast or irregular, but different and pounding No CP No dizzy spells, near syncope or syncope. She feels her pounding perhaps most days not every day and can last hours. No bleeding or signs of bleeding She was in junctional rhythm, stable, BP, fatigued, weak, amiodarone stopped with plans to see Dr. Elberta Fortis.  At her visit with Dr. Elberta Fortis 04/06/22, she was feeling much better, planned to have er back in a few months once amio washed out to evaluate her AFib/PVC burdens/symptoms.  07/09/21, with Dr. Elberta Fortis, she continued to feel well, noted with PVCs, though no symptoms, no changes made.  04/07/21, with Dr. Elberta Fortis again doing quite well, able to do her ADLs.  PVCs with minial symptoms, no changes.      TODAY She is accompanied by her daughter, he memory is not perfect, her daughter helps. No reports of any concerns She denies any CP, once in a while has a fleeting skip in her heart beat No SOB No near syncope or syncope. No bleeding or signs of bleeding She feels well, her daughter confirms, seems to be doing quite well.  Has a new PMD Had labs done there last time she was at the office.   AFib/AAD hx Flecainide 2017 stopped 2017 Holter noted 18% PVC burden and had nocturnal  rates in the 20's changed to Amiodarone started Oct  >> stopped Nov 2021 with development of junctional rhythm  Junctional rhythm required reduction in her CCB  Past Medical History:  Diagnosis Date   Bronchiectasis (HCC)    with multiple pneumonia back in 1990's   Bronchitis    episodic   DJD (degenerative joint disease) of knee    GERD (gastroesophageal reflux disease)    episodic symptoms   Hearing loss    chronic moderate hearing loss, worked up by ENT and hearing aids have been recommended   History of nuclear stress test 11/07   NORMAL   Insomnia    PVC's (premature ventricular contractions)    on EKG, controlled on atenolol and caffeine reduction   Tinnitus    chronic    Past Surgical History:  Procedure Laterality Date   BREAST EXCISIONAL BIOPSY Left    CARDIOVERSION N/A 08/31/2015   Procedure: CARDIOVERSION;  Surgeon: Jake Bathe, MD;  Location: [facility];  Service: Cardiovascular;  Laterality: N/A;   cyst removed     left breast    Current Outpatient Medications  Medication Sig Dispense Refill   apixaban (ELIQUIS) 2.5 MG TABS tablet TAKE 1 TABLET(2.5 MG) BY MOUTH TWICE DAILY 180 tablet 2   Cholecalciferol (VITAMIN D3) 2000 units TABS Take 1 capsule by mouth daily.     ipratropium-albuterol (DUONEB) 0.5-2.5 (3) MG/3ML SOLN Take 3 mLs by nebulization  every 6 (six) hours as needed. 360 mL 11   Melatonin 10 MG TABS Take by mouth.     sertraline (ZOLOFT) 50 MG tablet Take 50 mg by mouth daily.     No current facility-administered medications for this visit.    Allergies:   Augmentin [amoxicillin-pot clavulanate], Biaxin [clarithromycin], Metoprolol, and Tequin [gatifloxacin]   Social History:  The patient  reports that she quit smoking about 54 years ago. Her smoking use included cigarettes. She has a 10.00 pack-year smoking history. She has never used smokeless tobacco. She reports current alcohol use. She reports that she does not use drugs.   Family  History:  The patient's family history includes Asthma in her sister; Clotting disorder in her sister; Hypertension in her father and mother; Stroke in her father and mother.  ROS:  Please see the history of present illness.    All other systems are reviewed and otherwise negative.   PHYSICAL EXAM:  VS:  There were no vitals taken for this visit. BMI: There is no height or weight on file to calculate BMI. Well nourished, well developed, in no acute distress HEENT: normocephalic, atraumatic Neck: no JVD, carotid bruits or masses Cardiac:  RRR; no significant murmurs, no rubs, or gallops Lungs:  CTA b/l, no wheezing, rhonchi or rales Abd: soft, nontender MS: no deformity, age appropriate, perhaps advanced atrophy Ext: no edema Skin: warm and dry, no rash Neuro:  No gross deficits appreciated Psych: euthymic mood, full affect   EKG:  Done today and reviewed by myself shows  SB 53bpm  TTE 07/03/15  - Left ventricle: The cavity size was normal. Systolic function was   normal. The estimated ejection fraction was in the range of 55%   to 60%. Wall motion was normal; there were no regional wall   motion abnormalities.   Holter 12/13/15 NSR, intermittent junctional rhythm. PACs, PVCs. Minimum HR 30 at 4AM, presumably during sleep. No atrial fibrillation.   Holter 02/28/16 Minimum HR: 17 BPM at 3:44:42 AM Maximum HR: 114 BPM at 9:07:34 AM Average HR: 51 BPM 18.2% PVCs 2.9% APCs Sinus rhytnm Zero atrial fibrillation Occasional junctional rhythm during sleeping hours 4.8 second sinus pause during sleeping hours Chest pounding associated with ventricular bigeminy Longest ventriclar run 12 beats    Recent Labs: No results found for requested labs within last 365 days.  No results found for requested labs within last 365 days.   CrCl cannot be calculated (Patient's most recent lab result is older than the maximum 21 days allowed.).   Wt Readings from Last 3 Encounters:  01/12/22  120 lb 6.4 oz (54.6 kg)  04/07/21 121 lb (54.9 kg)  01/19/21 119 lb 6.4 oz (54.2 kg)     Other studies reviewed: Additional studies/records reviewed today include: summarized above  ASSESSMENT AND PLAN:  1. Paroxysmal Afib 2.  Secondary hypercoagulable state     CHA2DS2Vasc is 3, on Eliquis,  appropriately dosed for age/weight  Will request labs from her PMD office  3. PVCs     None on her EKG or exam     An infrequent palpitations       4. Sinus bradycardia asymptomatic         Disposition: we can continue to see her annually, sooner if needed  Current medicines are reviewed at length with the patient today.  The patient did not have any concerns regarding medicines.  Norma Fredrickson, PA-C 05/28/2022 4:58 PM     [facility] [id number] [place] [address] [phone] (office)  [phone] (fax)

## 2022-06-01 ENCOUNTER — Encounter: Payer: Self-pay | Admitting: Physician Assistant

## 2022-06-01 ENCOUNTER — Ambulatory Visit: Payer: Medicare Other | Attending: Physician Assistant | Admitting: Physician Assistant

## 2022-06-01 VITALS — BP 120/70 | HR 53 | Ht 62.0 in | Wt 121.0 lb

## 2022-06-01 DIAGNOSIS — I493 Ventricular premature depolarization: Secondary | ICD-10-CM | POA: Diagnosis not present

## 2022-06-01 DIAGNOSIS — Z79899 Other long term (current) drug therapy: Secondary | ICD-10-CM

## 2022-06-01 DIAGNOSIS — R001 Bradycardia, unspecified: Secondary | ICD-10-CM

## 2022-06-01 DIAGNOSIS — D6869 Other thrombophilia: Secondary | ICD-10-CM | POA: Diagnosis not present

## 2022-06-01 DIAGNOSIS — I480 Paroxysmal atrial fibrillation: Secondary | ICD-10-CM

## 2022-06-01 MED ORDER — APIXABAN 2.5 MG PO TABS: ORAL_TABLET | ORAL | 2 refills | Status: DC

## 2022-06-01 NOTE — Patient Instructions (Signed)
Medication Instructions:   Your physician recommends that you continue on your current medications as directed. Please refer to the Current Medication list given to you today.   *If you need a refill on your cardiac medications before your next appointment, please call your pharmacy*   Lab Work: [place]    If you have labs (blood work) drawn today and your tests are completely normal, you will receive your results only by: [place] (if you have MyChart) OR A paper copy in the mail If you have any lab test that is abnormal or we need to change your treatment, we will call you to review the results.   Testing/Procedures: .NONE ORDERED  TODAY     Follow-Up: At [facility], you and your health needs are our priority.  As part of our continuing mission to provide you with exceptional heart care, we have created designated Provider Care Teams.  These Care Teams include your primary Cardiologist (physician) and Advanced Practice Providers (APPs -  Physician Assistants and Nurse Practitioners) who all work together to provide you with the care you need, when you need it.  We recommend signing up for the patient portal called "MyChart".  Sign up information is provided on this After Visit Summary.  MyChart is used to connect with patients for Virtual Visits (Telemedicine).  Patients are able to view lab/test results, encounter notes, upcoming appointments, etc.  Non-urgent messages can be sent to your provider as well.   To learn more about what you can do with MyChart, go to [email or web address].    Your next appointment:   1 year(s)  The format for your next appointment:   In Person  Provider:   Tommye Standard, PA-C    Other Instructions   Important Information About [place]

## 2022-06-16 ENCOUNTER — Telehealth: Payer: Self-pay | Admitting: Pulmonary Disease

## 2022-06-16 ENCOUNTER — Encounter: Payer: Self-pay | Admitting: Pulmonary Disease

## 2022-06-16 DIAGNOSIS — J479 Bronchiectasis, uncomplicated: Secondary | ICD-10-CM

## 2022-06-16 MED ORDER — IPRATROPIUM-ALBUTEROL 0.5-2.5 (3) MG/3ML IN SOLN: 3.0000 mL | Freq: Four times a day (QID) | RESPIRATORY_TRACT | 0 refills | Status: DC | PRN

## 2022-06-16 NOTE — Telephone Encounter (Signed)
Spoke to patients daughter Eustaquio Maize she states patient only has a couple of day of duo neb medication left and is wondering if we received the paperwork from her mail order pharmacy so they can fill this medication for her. I advised I would send a message to Dr. Valeta Harms to verify and get back to her with a response. In the mean time I sent a months of the duo neb to her regular pharmacy per patient request.  I will leave this encounter open until we speak with Dr. Valeta Harms in regards to the paperwork

## 2022-06-19 MED ORDER — IPRATROPIUM-ALBUTEROL 0.5-2.5 (3) MG/3ML IN SOLN: 3.0000 mL | Freq: Four times a day (QID) | RESPIRATORY_TRACT | 0 refills | Status: DC | PRN

## 2022-06-19 NOTE — Telephone Encounter (Signed)
PT daughter calling. Went to pick up RX but needed "some kind of code" so she could not get it. Pls call daughter to advise @ [phone]. Her name is Corporate treasurer.

## 2022-06-26 ENCOUNTER — Other Ambulatory Visit: Payer: Self-pay

## 2022-06-26 ENCOUNTER — Emergency Department (HOSPITAL_BASED_OUTPATIENT_CLINIC_OR_DEPARTMENT_OTHER)
Admission: EM | Admit: 2022-06-26 | Discharge: 2022-06-26 | Disposition: A | Discharge status: Home / Self Care | Payer: Medicare Other | Attending: Emergency Medicine | Admitting: Emergency Medicine

## 2022-06-26 ENCOUNTER — Emergency Department (HOSPITAL_BASED_OUTPATIENT_CLINIC_OR_DEPARTMENT_OTHER): Payer: Medicare Other

## 2022-06-26 DIAGNOSIS — Z7901 Long term (current) use of anticoagulants: Secondary | ICD-10-CM | POA: Insufficient documentation

## 2022-06-26 DIAGNOSIS — N3000 Acute cystitis without hematuria: Secondary | ICD-10-CM | POA: Diagnosis not present

## 2022-06-26 DIAGNOSIS — Z1152 Encounter for screening for COVID-19: Secondary | ICD-10-CM | POA: Diagnosis not present

## 2022-06-26 DIAGNOSIS — R519 Headache, unspecified: Secondary | ICD-10-CM | POA: Diagnosis present

## 2022-06-26 DIAGNOSIS — R4182 Altered mental status, unspecified: Secondary | ICD-10-CM | POA: Diagnosis not present

## 2022-06-26 LAB — CBC WITH DIFFERENTIAL/PLATELET
Abs Immature Granulocytes: 0.02 10*3/uL (ref 0.00–0.07)
Basophils Absolute: 0 10*3/uL (ref 0.0–0.1)
Basophils Relative: 0 %
Eosinophils Absolute: 0.1 10*3/uL (ref 0.0–0.5)
Eosinophils Relative: 2 %
HCT: 43.3 % (ref 36.0–46.0)
Hemoglobin: 14.1 g/dL (ref 12.0–15.0)
Immature Granulocytes: 0 %
Lymphocytes Relative: 23 %
Lymphs Abs: 1.4 10*3/uL (ref 0.7–4.0)
MCH: 29.4 pg (ref 26.0–34.0)
MCHC: 32.6 g/dL (ref 30.0–36.0)
MCV: 90.4 fL (ref 80.0–100.0)
Monocytes Absolute: 0.4 10*3/uL (ref 0.1–1.0)
Monocytes Relative: 7 %
Neutro Abs: 4.1 10*3/uL (ref 1.7–7.7)
Neutrophils Relative %: 68 %
Platelets: 159 10*3/uL (ref 150–400)
RBC: 4.79 MIL/uL (ref 3.87–5.11)
RDW: 13.8 % (ref 11.5–15.5)
WBC: 6.1 10*3/uL (ref 4.0–10.5)
nRBC: 0 % (ref 0.0–0.2)

## 2022-06-26 LAB — COMPREHENSIVE METABOLIC PANEL
ALT: 7 U/L (ref 0–44)
AST: 15 U/L (ref 15–41)
Albumin: 4.1 g/dL (ref 3.5–5.0)
Alkaline Phosphatase: 63 U/L (ref 38–126)
Anion gap: 10 (ref 5–15)
BUN: 15 mg/dL (ref 8–23)
CO2: 26 mmol/L (ref 22–32)
Calcium: 9.3 mg/dL (ref 8.9–10.3)
Chloride: 104 mmol/L (ref 98–111)
Creatinine, Ser: 0.68 mg/dL (ref 0.44–1.00)
GFR, Estimated: >60 mL/min (ref >60)
Glucose, Bld: 98 mg/dL (ref 70–99)
Potassium: 4.1 mmol/L (ref 3.5–5.1)
Sodium: 140 mmol/L (ref 135–145)
Total Bilirubin: 0.6 mg/dL (ref 0.3–1.2)
Total Protein: 6.9 g/dL (ref 6.5–8.1)

## 2022-06-26 LAB — RESP PANEL BY RT-PCR (RSV, FLU A&B, COVID)  RVPGX2
Influenza A by PCR: NEGATIVE
Influenza B by PCR: NEGATIVE
Resp Syncytial Virus by PCR: NEGATIVE
SARS Coronavirus 2 by RT PCR: NEGATIVE

## 2022-06-26 LAB — CBG MONITORING, ED: Glucose-Capillary: 103 mg/dL — ABNORMAL HIGH (ref 70–99)

## 2022-06-26 LAB — URINALYSIS, ROUTINE W REFLEX MICROSCOPIC
Bilirubin Urine: NEGATIVE
Glucose, UA: NEGATIVE mg/dL
Hgb urine dipstick: NEGATIVE
Ketones, ur: NEGATIVE mg/dL
Nitrite: NEGATIVE
Specific Gravity, Urine: 1.021 (ref 1.005–1.030)
pH: 6.5 (ref 5.0–8.0)

## 2022-06-26 LAB — LIPASE, BLOOD: Lipase: 21 U/L (ref 11–51)

## 2022-06-26 LAB — TROPONIN I (HIGH SENSITIVITY): Troponin I (High Sensitivity): 3 ng/L (ref <18)

## 2022-06-26 MED ORDER — CEPHALEXIN 500 MG PO CAPS: 500.0000 mg | ORAL_CAPSULE | Freq: Two times a day (BID) | ORAL | 0 refills | Status: AC

## 2022-06-26 MED ORDER — CEPHALEXIN 250 MG PO CAPS
500.0000 mg | ORAL_CAPSULE | Freq: Once | ORAL | Status: AC
  Administered 2022-06-26: 500 mg via ORAL
  Filled 2022-06-26: qty 2

## 2022-06-26 NOTE — Discharge Instructions (Signed)
Follow-up with primary care doctor, return if symptoms worsen as discussed.

## 2022-06-26 NOTE — ED Notes (Signed)
Patient transported to CT via stretcher escorted by [employer] - Pt awake and alert at time of transport; no obvious distress noted

## 2022-06-26 NOTE — ED Triage Notes (Signed)
Pt arrived POV with her daughter. Pt alert to person, but unable to answer time, place, and event. Per pt's daughter pt has had intermittent confusion at home. However, pt's daughter further states that she brought her dinner and found her slumped and staring and took a few minutes to arouse her which was approx 1815. Pt's daughter further reports she last saw pt completely normal 1600 and that she is at her baseline mental status at present. Pt c/o headache x2 days. On Eliquis for afib.

## 2022-06-26 NOTE — ED Notes (Signed)
Pt now returned from [place] - remains awake and alert- no acute changes observed.  Daughter at bedside.

## 2022-06-26 NOTE — ED Provider Notes (Signed)
[facility] Provider Note   CSN: [id number] Arrival date & time: 06/26/22  1923     History  Chief Complaint  Patient presents with   Altered Mental Status    [name] is a 89 y.o. female.  Patient brought in by family member after an episode of confusion.  Patient states that she just remembers not feeling well and had a sense of panic.  She did not lose consciousness.  Daughter states that she saw her when she went to go bring her food and she seemed kind of in shock and slumped over but was awake and alert.  No seizure activity.  No incontinence.  No history of seizures.  She has a history of A-fib and is on Eliquis but no other major medical problems.  She has not had any recent illness.  She denies any chest pain or shortness of breath.  She had a mild headache the last few days.  Denies any pain with urination.  She denies any weakness or vision loss or stroke symptoms during this episode.  Did not lose consciousness fully.  She just felt a sense of panic what she states.  She was able to walk to the car and she has been at her baseline ever since this event.  The history is provided by the patient and a caregiver.       Home Medications Prior to Admission medications   Medication Sig Start Date End Date Taking? Authorizing Provider  cephALEXin (KEFLEX) 500 MG capsule Take 1 capsule (500 mg total) by mouth 2 (two) times daily for 5 days. 06/26/22 07/01/22 Yes [place] Kappes, DO  apixaban (ELIQUIS) 2.5 MG TABS tablet TAKE 1 TABLET(2.5 MG) BY MOUTH TWICE DAILY 06/01/22   Baldwin Jamaica, PA-C  Cholecalciferol (VITAMIN D3) 2000 units TABS Take 1 capsule by mouth daily.    [provider]  ipratropium-albuterol (DUONEB) 0.5-2.5 (3) MG/3ML SOLN Take 3 mLs by nebulization every 6 (six) hours as needed. 06/19/22   Icard, Octavio Graves, DO  sertraline (ZOLOFT) 50 MG tablet Take 50 mg by mouth daily.    [provider]       Allergies    Augmentin [amoxicillin-pot clavulanate], Biaxin [clarithromycin], Metoprolol, and Tequin [gatifloxacin]    Review of Systems   Review of Systems  Physical Exam Updated Vital Signs BP 118/78   Pulse (!) 111   Temp 97.8 F (36.6 C) (Oral)   Resp 19   Ht '5\' 2"'$  (1.575 m)   Wt 54 kg   SpO2 98%   BMI 21.77 kg/m  Physical Exam Vitals and nursing note reviewed.  Constitutional:      General: She is not in acute distress.    Appearance: She is well-developed. She is not ill-appearing.  HENT:     Head: Normocephalic and atraumatic.     Nose: Nose normal.     Mouth/Throat:     Mouth: Mucous membranes are moist.  Eyes:     Extraocular Movements: Extraocular movements intact.     Conjunctiva/sclera: Conjunctivae normal.     Pupils: Pupils are equal, round, and reactive to light.  Cardiovascular:     Rate and Rhythm: Normal rate and regular rhythm.     Pulses: Normal pulses.     Heart sounds: Normal heart sounds. No murmur heard. Pulmonary:     Effort: Pulmonary effort is normal. No respiratory distress.     Breath sounds: Normal breath sounds.  Abdominal:  Palpations: Abdomen is soft.     Tenderness: There is no abdominal tenderness.  Musculoskeletal:        General: No swelling.     Cervical back: Normal range of motion and neck supple.  Skin:    General: Skin is warm and dry.     Capillary Refill: Capillary refill takes less than 2 seconds.  Neurological:     General: No focal deficit present.     Mental Status: She is alert and oriented to person, place, and time.     Cranial Nerves: No cranial nerve deficit.     Sensory: No sensory deficit.     Motor: No weakness.     Coordination: Coordination normal.  Psychiatric:        Mood and Affect: Mood normal.     ED Results / Procedures / Treatments   Labs (all labs ordered are listed, but only abnormal results are displayed) Labs Reviewed  URINALYSIS, ROUTINE W REFLEX MICROSCOPIC - Abnormal;  Notable for the following components:      Result Value   Protein, ur TRACE (*)    Leukocytes,Ua MODERATE (*)    Bacteria, UA RARE (*)    All other components within normal limits  CBG MONITORING, ED - Abnormal; Notable for the following components:   Glucose-Capillary 103 (*)    All other components within normal limits  RESP PANEL BY RT-PCR (RSV, FLU A&B, COVID)  [id number]  URINE CULTURE  CBC WITH DIFFERENTIAL/PLATELET  COMPREHENSIVE METABOLIC PANEL  LIPASE, BLOOD  TROPONIN I (HIGH SENSITIVITY)    EKG EKG Interpretation  Date/Time:  Monday June 26 2022 19:56:55 EST Ventricular Rate:  104 PR Interval:    QRS Duration: 77 QT Interval:  357 QTC Calculation: 470 R Axis:   -35 Text Interpretation: Atrial fibrillation Multiple ventricular premature complexes Left axis deviation Anteroseptal infarct, age indeterminate Confirmed by Lennice Sites ([id number]) on 06/26/2022 8:19:02 PM  Radiology CT Head Wo Contrast  Result Date: 06/26/2022 CLINICAL DATA:  Found unresponsive, altered mental status EXAM: CT HEAD WITHOUT CONTRAST TECHNIQUE: Contiguous axial images were obtained from the base of the skull through the vertex without intravenous contrast. RADIATION DOSE REDUCTION: This exam was performed according to the departmental dose-optimization program which includes automated exposure control, adjustment of the mA and/or kV according to patient size and/or use of iterative reconstruction technique. COMPARISON:  01/15/2006 FINDINGS: Brain: No evidence of acute infarction, hemorrhage, hydrocephalus, extra-axial collection or mass lesion/mass effect. Mild atrophic changes are noted. Mild chronic white matter ischemic change is seen as well. Vascular: No hyperdense vessel or unexpected calcification. Skull: Normal. Negative for fracture or focal lesion. Sinuses/Orbits: No acute finding. Other: None. IMPRESSION: Chronic atrophic and ischemic changes without acute abnormality. Electronically Signed    By: Inez Catalina M.D.   On: 06/26/2022 20:09    Procedures Procedures    Medications Ordered in ED Medications  cephALEXin (KEFLEX) capsule 500 mg (has no administration in time range)    ED Course/ Medical Decision Making/ A&P                             Medical Decision Making Amount and/or Complexity of Data Reviewed Labs: ordered. Radiology: ordered.  Risk Prescription drug management.   BRION HEDGES is here after episode of confusion.  Normal vitals.  No fever.  History of A-fib on Eliquis.  Patient well-appearing.  No complaints currently.  She lives with her daughter.  She  had an episode of some confusion at home.  Patient states that she felt a panic and felt scared.  She did not lose consciousness.  She was not having any chest pain or shortness of breath.  She had a mild headache for the last 2 days.  No seizure activity per daughter.  Never had an episode like this in the past.  She has been without any issues otherwise.  Family did not recognize any facial droop or difficulty with speech.  She has a normal neurological exam.  EKG shows rate controlled atrial fibrillation per my review and interpretation.  Seems less likely to be stroke related event as she did not have any speech changes or vision loss or your weakness or numbness during the event.  She is not under a lot of anxiety.  She does not have panic attacks.  Her vital signs are unremarkable.  She had not had anything to eat yet for dinner.  Overall differential will evaluate for infection versus dehydration versus cardiac process versus head bleed.  Will get CBC, CMP, troponin, COVID and flu testing, urinalysis, chest x-ray and head CT.  Per my review and interpretation of labs no significant anemia or electrolyte abnormality or kidney injury.  Head CT unremarkable.  Chest x-ray per my review interpretation shows no evidence of pneumonia or pneumothorax.  Troponin is normal.  Is not having chest pain.  She has been  at her baseline while here with no issues.  I have no suspicion for stroke or other cardiac or pulmonary process.  Awaiting urinalysis and anticipate discharge to home.  Urinalysis with moderate leukocytes and some bacteria.  Will conservatively treat with antibiotics.  Will send urine culture.  Discharged in good condition.  Understands return precautions.  At this time family is okay with discharge to home I did offer may be observation stay to do further cardiac monitoring however she never lost consciousness and overall she is well-appearing.  This chart was dictated using voice recognition software.  Despite best efforts to proofread,  errors can occur which can change the documentation meaning.         Final Clinical Impression(s) / ED Diagnoses Final diagnoses:  Acute cystitis without hematuria    Rx / DC Orders ED Discharge Orders          Ordered    cephALEXin (KEFLEX) 500 MG capsule  2 times daily        06/26/22 2312              Lennice Sites, DO 06/26/22 2313

## 2022-06-29 LAB — URINE CULTURE: Culture: 80000 — AB

## 2022-06-30 ENCOUNTER — Telehealth (HOSPITAL_BASED_OUTPATIENT_CLINIC_OR_DEPARTMENT_OTHER): Payer: Self-pay | Admitting: *Deleted

## 2022-06-30 NOTE — Telephone Encounter (Signed)
Post ED Visit - Positive Culture Follow-up  Culture report reviewed by antimicrobial stewardship pharmacist: [facility] Team '[]'$  Elenor Quinones, Pharm.D. '[]'$  Heide Guile, Pharm.D., BCPS AQ-ID '[]'$  Parks Neptune, Pharm.D., BCPS '[]'$  Alycia Rossetti, Pharm.D., BCPS '[]'$  [place], Pharm.D., BCPS, AAHIVP '[]'$  Legrand Como, Pharm.D., BCPS, AAHIVP '[]'$  Salome Arnt, PharmD, BCPS '[]'$  Johnnette Gourd, PharmD, BCPS '[]'$  Hughes Better, PharmD, BCPS '[]'$  Leeroy Cha, PharmD '[]'$  Laqueta Linden, PharmD, BCPS '[]'$  Albertina Parr, PharmD  [facility] Team '[]'$  Leodis Sias, PharmD '[]'$  Lindell Spar, PharmD '[]'$  Royetta Asal, PharmD '[]'$  Graylin Shiver, Rph '[]'$  Rema Fendt) Glennon Mac, PharmD '[]'$  Arlyn Dunning, PharmD '[]'$  Netta Cedars, PharmD '[]'$  Dia Sitter, PharmD '[]'$  Leone Haven, PharmD '[]'$  Gretta Arab, PharmD '[]'$  Theodis Shove, PharmD '[]'$  Peggyann Juba, PharmD '[]'$  Reuel Boom, PharmD   Positive urine culture Treated with Cephalexin, organism sensitive to the same and no further patient follow-up is required at this time.  Harlon Flor [facility] 06/30/2022, 7:44 AM

## 2022-08-03 DIAGNOSIS — L309 Dermatitis, unspecified: Secondary | ICD-10-CM | POA: Diagnosis not present

## 2022-08-03 DIAGNOSIS — R413 Other amnesia: Secondary | ICD-10-CM | POA: Diagnosis not present

## 2022-08-03 DIAGNOSIS — Z0000 Encounter for general adult medical examination without abnormal findings: Secondary | ICD-10-CM | POA: Diagnosis not present

## 2022-08-03 DIAGNOSIS — Z1331 Encounter for screening for depression: Secondary | ICD-10-CM | POA: Diagnosis not present

## 2022-08-03 DIAGNOSIS — Z79899 Other long term (current) drug therapy: Secondary | ICD-10-CM | POA: Diagnosis not present

## 2022-08-03 DIAGNOSIS — J449 Chronic obstructive pulmonary disease, unspecified: Secondary | ICD-10-CM | POA: Diagnosis not present

## 2022-08-03 DIAGNOSIS — J479 Bronchiectasis, uncomplicated: Secondary | ICD-10-CM | POA: Diagnosis not present

## 2022-08-03 DIAGNOSIS — I480 Paroxysmal atrial fibrillation: Secondary | ICD-10-CM | POA: Diagnosis not present

## 2022-08-04 ENCOUNTER — Encounter: Payer: Self-pay | Admitting: Pulmonary Disease

## 2022-08-07 NOTE — Telephone Encounter (Signed)
[facility] and was on a 10 min hold. I left a detailed message for them to call us back to see what is needed for the patient and her medication.

## 2022-08-10 ENCOUNTER — Telehealth: Payer: Self-pay | Admitting: Pulmonary Disease

## 2022-08-10 NOTE — Telephone Encounter (Signed)
[facility] calling back and saying they need progress notes from last visit within the year and a list of neb.meds and DX code so they can get the meds sent out to pt. [phone]

## 2022-08-11 NOTE — Telephone Encounter (Signed)
Last office visit notes and medication list have been printed. Will fax to [facility] as requested.

## 2022-10-03 ENCOUNTER — Ambulatory Visit: Payer: Medicare Other | Admitting: Physician Assistant

## 2022-10-17 IMAGING — DX DG FOOT COMPLETE 3+V*R*
3 series · 3 of 3 positions shown · non-contrast
Comparison: None.

CLINICAL DATA: Fall 5 days ago, pain/discoloration in distal fifth
metatarsal

EXAM:
RIGHT FOOT COMPLETE - 3+ VIEW

[dg foot complete right (1 of 3)]
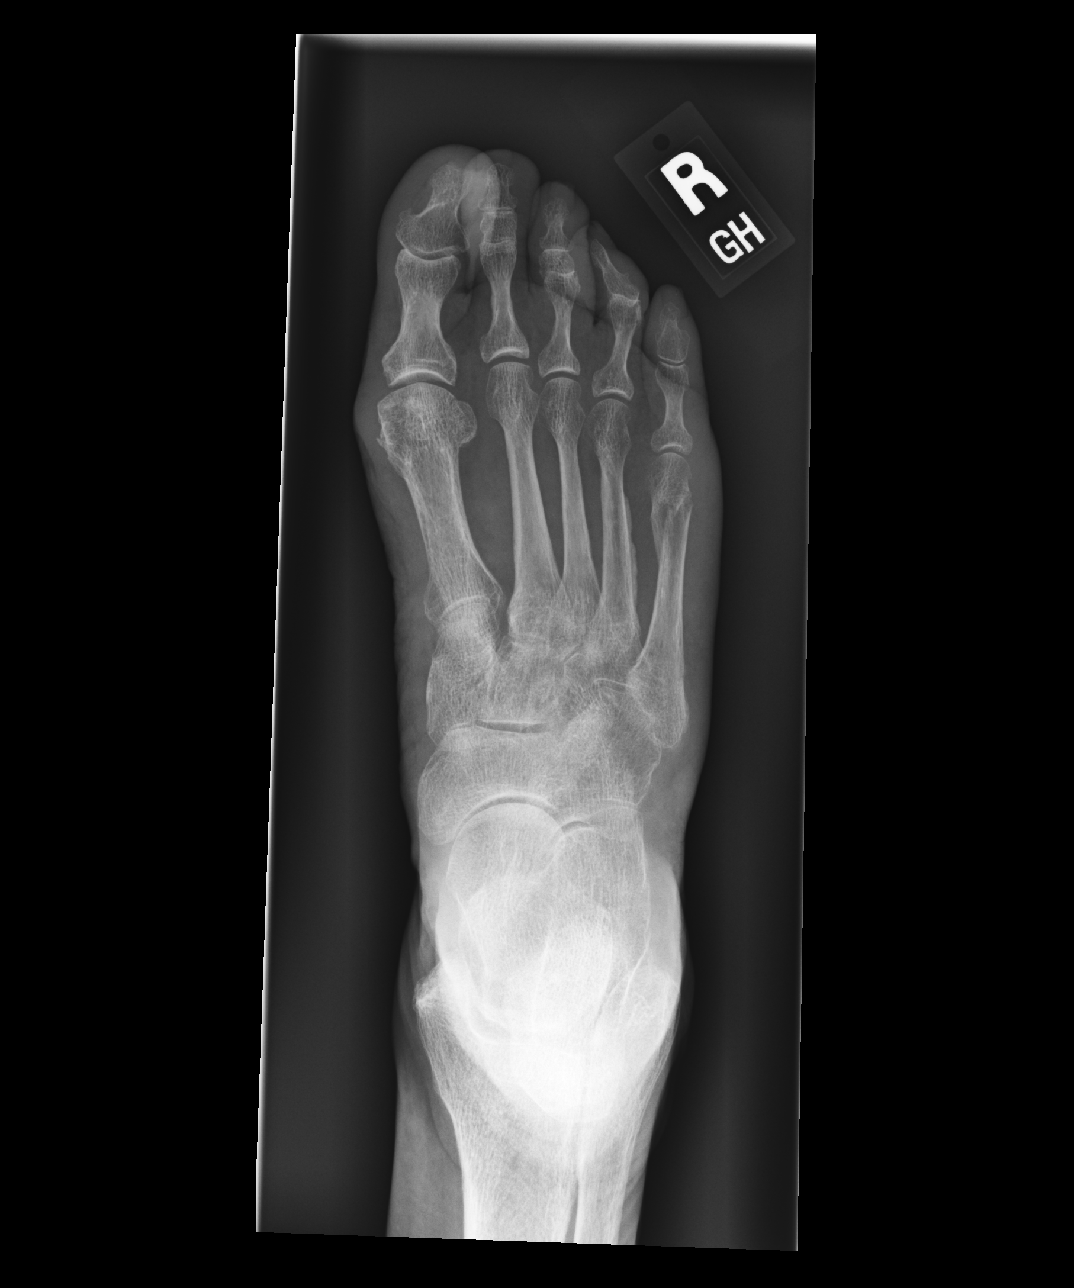

[dg foot complete right (2 of 3)]
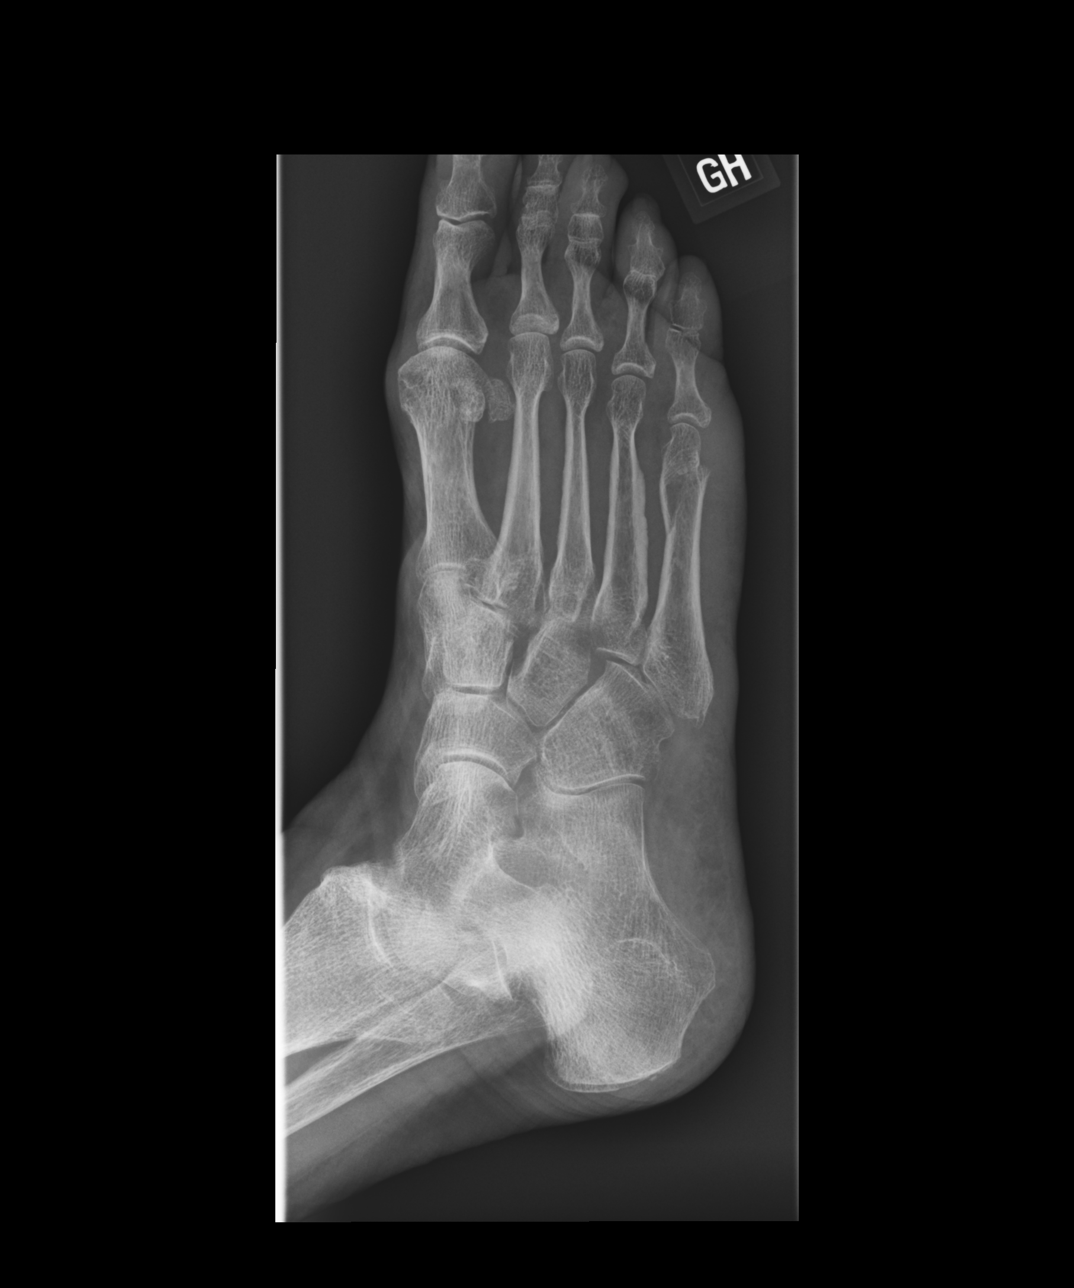

[dg foot complete right (3 of 3)]
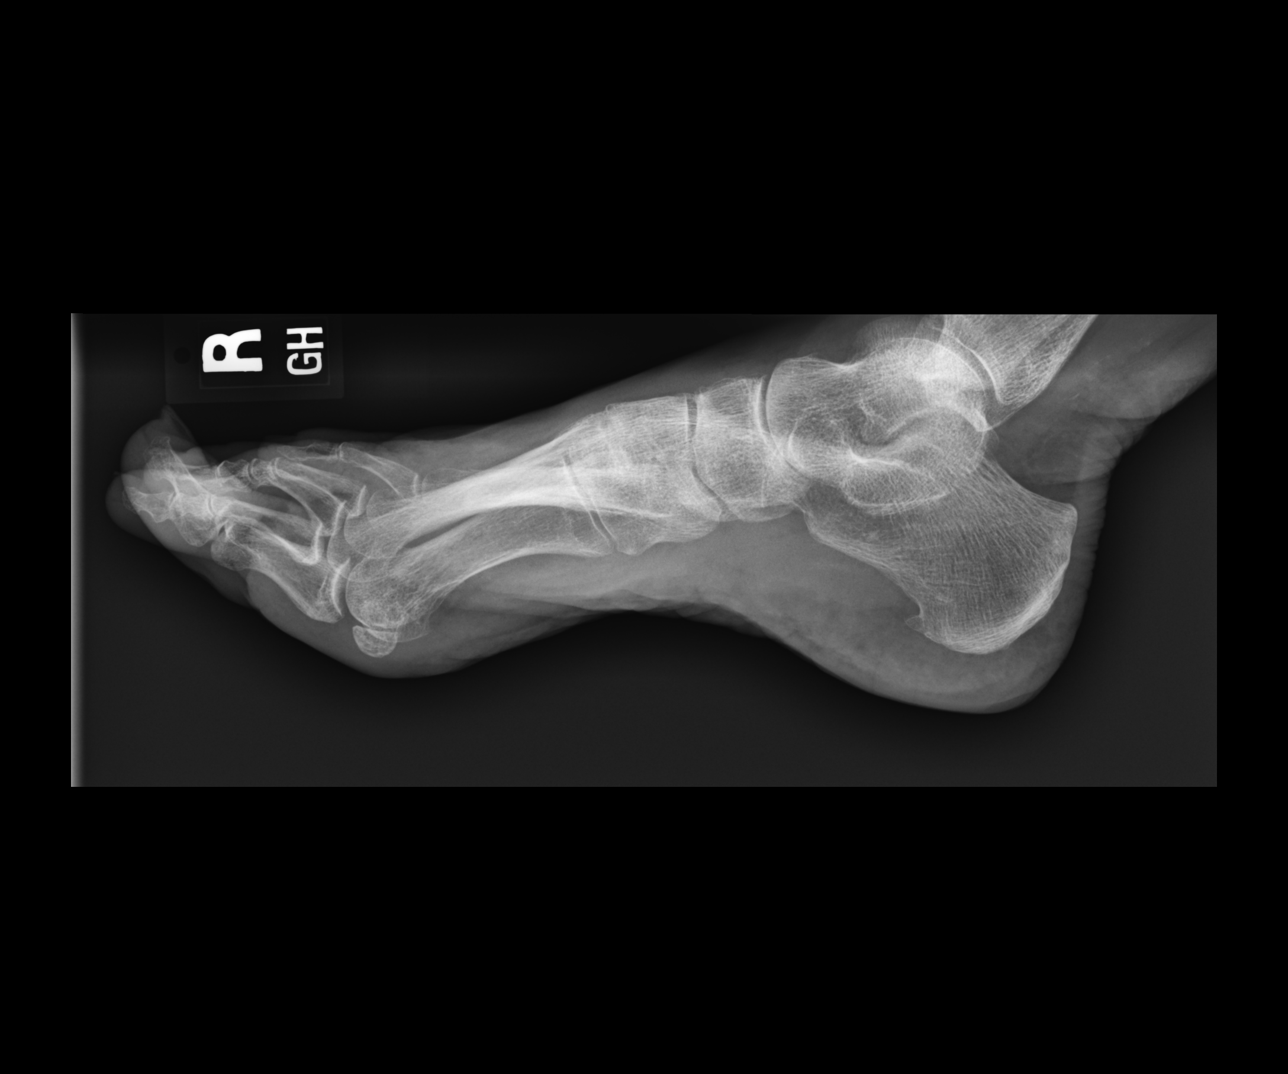

[3 of 3 positions shown; findings below may reference images not displayed]

FINDINGS: There is an oblique mildly displaced fracture of the distal
metaphysis of the fifth metatarsal without evidence of
intra-articular extension. There is a possible fracture of the head
of the fifth toe proximal phalanx without evidence of
intra-articular extension. There is overlying soft tissue swelling.

No other fracture is seen. Alignment is maintained. The Lisfranc and
Chopart joints are intact. There is no erosive change. There is mild
inferior calcaneal spurring.
IMPRESSION: Oblique mildly displaced fracture of the distal metaphysis of fifth
toe metatarsal and possible nondisplaced fracture of the head of the
fifth toe proximal phalanx.

These results will be called to the ordering clinician or
representative by the Radiologist Assistant, and communication
documented in the PACS or [REDACTED].

## 2022-10-18 ENCOUNTER — Telehealth: Payer: Self-pay | Admitting: Pulmonary Disease

## 2022-10-18 NOTE — Telephone Encounter (Signed)
[facility] called to follow up on a fax that was sent regarding patient's medication.  They stated they have not heard anything back from the office.  Please advise and call to discuss.  CB# [phone], Fax# [phone]

## 2022-10-20 NOTE — Telephone Encounter (Signed)
Called [facility] with [facility] but unable to speak to anyone. Left message for them to return call.

## 2022-10-26 DIAGNOSIS — L821 Other seborrheic keratosis: Secondary | ICD-10-CM | POA: Diagnosis not present

## 2022-10-26 DIAGNOSIS — L309 Dermatitis, unspecified: Secondary | ICD-10-CM | POA: Diagnosis not present

## 2022-10-26 DIAGNOSIS — L853 Xerosis cutis: Secondary | ICD-10-CM | POA: Diagnosis not present

## 2022-10-26 DIAGNOSIS — H61002 Unspecified perichondritis of left external ear: Secondary | ICD-10-CM | POA: Diagnosis not present

## 2022-10-26 DIAGNOSIS — D225 Melanocytic nevi of trunk: Secondary | ICD-10-CM | POA: Diagnosis not present

## 2022-12-26 DIAGNOSIS — H903 Sensorineural hearing loss, bilateral: Secondary | ICD-10-CM | POA: Diagnosis not present

## 2023-01-18 DIAGNOSIS — F02C0 Dementia in other diseases classified elsewhere, severe, without behavioral disturbance, psychotic disturbance, mood disturbance, and anxiety: Secondary | ICD-10-CM | POA: Diagnosis not present

## 2023-01-18 DIAGNOSIS — G301 Alzheimer's disease with late onset: Secondary | ICD-10-CM | POA: Diagnosis not present

## 2023-02-15 NOTE — Telephone Encounter (Signed)
Per My Chart message from patient's daughter.  Pts VS show HR irregular and elevated.  Historically is slow 50s-60.  I scheduled her to see Mardelle Matte tomorrow in clinic.  Daughter grateful for assistance.

## 2023-02-16 ENCOUNTER — Encounter: Payer: Self-pay | Admitting: *Deleted

## 2023-02-16 ENCOUNTER — Encounter: Payer: Self-pay | Admitting: Student

## 2023-02-16 ENCOUNTER — Ambulatory Visit: Payer: Medicare Other | Attending: Student | Admitting: Student

## 2023-02-16 VITALS — BP 130/84 | HR 125 | Ht 62.0 in | Wt 99.6 lb

## 2023-02-16 DIAGNOSIS — I480 Paroxysmal atrial fibrillation: Secondary | ICD-10-CM | POA: Insufficient documentation

## 2023-02-16 DIAGNOSIS — R001 Bradycardia, unspecified: Secondary | ICD-10-CM | POA: Diagnosis not present

## 2023-02-16 DIAGNOSIS — I493 Ventricular premature depolarization: Secondary | ICD-10-CM | POA: Insufficient documentation

## 2023-02-16 NOTE — Progress Notes (Signed)
Electrophysiology Office Note:   Date:  02/16/2023  ID:  [name], DOB 05-Nov-1930, MRN [id number]  Primary Cardiologist: None Electrophysiologist: Will Jorja Loa, MD      History of Present Illness:   [name] is a 89 y.o. female with h/o bronchiectasis, PVCs, and AFIB seen today for acute visit due to HRs around 100.    Patient reports tachy and irregular HRs at least since 9/10. She has mild fatigue/dyspnea and is having slightly more peripheral edema.   Review of chart shows sinus brady in January, but later that month had AF. Likely AF 12/2021 by vitals with HR in the 100s. Before that, fastest HR was 90 in 02/2019. She has recent diagnosis of Alzheimers, and history is somewhat limited.   Review of systems complete and found to be negative unless listed in HPI.   EP Information / Studies Reviewed:    EKG is ordered today. Personal review as below.  EKG Interpretation Date/Time:  Friday February 16 2023 12:12:56 EDT Ventricular Rate:  121 PR Interval:    QRS Duration:  60 QT Interval:  358 QTC Calculation: 508 R Axis:   108  Text Interpretation: Atrial fibrillation with rapid ventricular response with premature ventricular or aberrantly conducted complexes Rightward axis Confirmed by Maxine Glenn [phone]) on 02/16/2023 12:16:53 PM    AFib/AAD hx Flecainide 2017 stopped 2017 Holter noted 18% PVC burden and had nocturnal rates in the 20's changed to Amiodarone started Oct  >> stopped Nov 2021 with development of junctional rhythm   Junctional rhythm required reduction in her CCB  Physical Exam:   VS:  BP 130/84 (BP Location: Right Arm, Patient Position: Sitting, Cuff Size: Small)   Pulse (!) 125   Ht 5\' 2"  (1.575 m)   Wt 99 lb 9.6 oz (45.2 kg)   SpO2 98%   BMI 18.22 kg/m    Wt Readings from Last 3 Encounters:  02/16/23 99 lb 9.6 oz (45.2 kg)  06/26/22 119 lb 0.8 oz (54 kg)  06/01/22 121 lb (54.9 kg)     GEN: Elderly and thin appearing, no acute  distress NECK: No JVD; No carotid bruits CARDIAC: Irregularly irregular rate and rhythm, no murmurs, rubs, gallops RESPIRATORY:  Clear to auscultation without rales, wheezing or rhonchi  ABDOMEN: Soft, non-tender, non-distended EXTREMITIES:  No edema; No deformity   ASSESSMENT AND PLAN:    Persistent AF Secondary hypercoagulable state Fast HRs documented by facility at least back to 9/10. She is symptomatic with mild edema and more fatigue. Has failed multiple meds for bradycardia.  Will plan DCCV on current medications No missed doses of Eliquis. Appropriate dose by weight and age.  If DCCV successful - Follow If DCCV fails options will be -  To try gentle rate control while leaving in AF  Vs   Consideration of PPM for Tach-brady so that we can more adequately treat her AF. Briefly discussed tikosyn but would only qualify for 125 mcg dosing.   PVCs Occasional on EKG  Sinus bradycardia Limits options for treatment of her AF as above May ultimately require pacing Not sure if she would tolerate gentle rate control if decided to leave in AF.   Follow up with Afib Clinic 1 week s/p cardioversion  Signed, Bethany Freer, PA-C

## 2023-02-16 NOTE — Patient Instructions (Signed)
Medication Instructions:  Your physician recommends that you continue on your current medications as directed. Please refer to the Current Medication list given to you today.  *If you need a refill on your cardiac medications before your next appointment, please call your pharmacy*  Lab Work: BMET, CBC-TODAY If you have labs (blood work) drawn today and your tests are completely normal, you will receive your results only by: MyChart Message (if you have MyChart) OR A paper copy in the mail If you have any lab test that is abnormal or we need to change your treatment, we will call you to review the results.  Testing/Procedures: Your physician has recommended that you have a Cardioversion (DCCV). Electrical Cardioversion uses a jolt of electricity to your heart either through paddles or wired patches attached to your chest. This is a controlled, usually prescheduled, procedure. Defibrillation is done under light anesthesia in the hospital, and you usually go home the day of the procedure. This is done to get your heart back into a normal rhythm. You are not awake for the procedure. Please see the instruction sheet given to you today.    Follow-Up: At [facility], you and your health needs are our priority.  As part of our continuing mission to provide you with exceptional heart care, we have created designated Provider Care Teams.  These Care Teams include your primary Cardiologist (physician) and Advanced Practice Providers (APPs -  Physician Assistants and Nurse Practitioners) who all work together to provide you with the care you need, when you need it.  Your next appointment:   1 week(s) after your cardioversion.  Provider:   You will follow up in the Atrial Fibrillation Clinic located at [facility].

## 2023-02-16 NOTE — H&P (View-Only) (Signed)
Electrophysiology Office Note:   Date:  02/16/2023  ID:  RHYANNON BARRINEAU, DOB 05-Nov-1930, MRN [id number]  Primary Cardiologist: None Electrophysiologist: Will Jorja Loa, MD      History of Present Illness:   [name] is a 89 y.o. female with h/o bronchiectasis, PVCs, and AFIB seen today for acute visit due to HRs around 100.    Patient reports tachy and irregular HRs at least since 9/10. She has mild fatigue/dyspnea and is having slightly more peripheral edema.   Review of chart shows sinus brady in January, but later that month had AF. Likely AF 12/2021 by vitals with HR in the 100s. Before that, fastest HR was 90 in 02/2019. She has recent diagnosis of Alzheimers, and history is somewhat limited.   Review of systems complete and found to be negative unless listed in HPI.   EP Information / Studies Reviewed:    EKG is ordered today. Personal review as below.  EKG Interpretation Date/Time:  Friday February 16 2023 12:12:56 EDT Ventricular Rate:  121 PR Interval:    QRS Duration:  60 QT Interval:  358 QTC Calculation: 508 R Axis:   108  Text Interpretation: Atrial fibrillation with rapid ventricular response with premature ventricular or aberrantly conducted complexes Rightward axis Confirmed by Maxine Glenn [phone]) on 02/16/2023 12:16:53 PM    AFib/AAD hx Flecainide 2017 stopped 2017 Holter noted 18% PVC burden and had nocturnal rates in the 20's changed to Amiodarone started Oct  >> stopped Nov 2021 with development of junctional rhythm   Junctional rhythm required reduction in her CCB  Physical Exam:   VS:  BP 130/84 (BP Location: Right Arm, Patient Position: Sitting, Cuff Size: Small)   Pulse (!) 125   Ht 5\' 2"  (1.575 m)   Wt 99 lb 9.6 oz (45.2 kg)   SpO2 98%   BMI 18.22 kg/m    Wt Readings from Last 3 Encounters:  02/16/23 99 lb 9.6 oz (45.2 kg)  06/26/22 119 lb 0.8 oz (54 kg)  06/01/22 121 lb (54.9 kg)     GEN: Elderly and thin appearing, no acute  distress NECK: No JVD; No carotid bruits CARDIAC: Irregularly irregular rate and rhythm, no murmurs, rubs, gallops RESPIRATORY:  Clear to auscultation without rales, wheezing or rhonchi  ABDOMEN: Soft, non-tender, non-distended EXTREMITIES:  No edema; No deformity   ASSESSMENT AND PLAN:    Persistent AF Secondary hypercoagulable state Fast HRs documented by facility at least back to 9/10. She is symptomatic with mild edema and more fatigue. Has failed multiple meds for bradycardia.  Will plan DCCV on current medications No missed doses of Eliquis. Appropriate dose by weight and age.  If DCCV successful - Follow If DCCV fails options will be -  To try gentle rate control while leaving in AF  Vs   Consideration of PPM for Tach-brady so that we can more adequately treat her AF. Briefly discussed tikosyn but would only qualify for 125 mcg dosing.   PVCs Occasional on EKG  Sinus bradycardia Limits options for treatment of her AF as above May ultimately require pacing Not sure if she would tolerate gentle rate control if decided to leave in AF.   Follow up with Afib Clinic 1 week s/p cardioversion  Signed, Graciella Freer, PA-C

## 2023-02-17 LAB — BASIC METABOLIC PANEL
BUN/Creatinine Ratio: 18 (ref 12–28)
BUN: 14 mg/dL (ref 8–27)
CO2: 25 mmol/L (ref 20–29)
Calcium: 9.5 mg/dL (ref 8.7–10.3)
Chloride: 102 mmol/L (ref 96–106)
Creatinine, Ser: 0.79 mg/dL (ref 0.57–1.00)
Glucose: 92 mg/dL (ref 70–99)
Potassium: 4.2 mmol/L (ref 3.5–5.2)
Sodium: 144 mmol/L (ref 134–144)
eGFR: 71 mL/min/{1.73_m2} (ref >59)

## 2023-02-17 LAB — CBC
Hematocrit: 43.8 % (ref 34.0–46.6)
Hemoglobin: 13.9 g/dL (ref 11.1–15.9)
MCH: 29.3 pg (ref 26.6–33.0)
MCHC: 31.7 g/dL (ref 31.5–35.7)
MCV: 92 fL (ref 79–97)
Platelets: 199 10*3/uL (ref 150–450)
RBC: 4.74 x10E6/uL (ref 3.77–5.28)
RDW: 13.5 % (ref 11.7–15.4)
WBC: 5.1 10*3/uL (ref 3.4–10.8)

## 2023-02-26 DIAGNOSIS — Z681 Body mass index (BMI) 19 or less, adult: Secondary | ICD-10-CM | POA: Diagnosis not present

## 2023-02-26 DIAGNOSIS — H6121 Impacted cerumen, right ear: Secondary | ICD-10-CM | POA: Diagnosis not present

## 2023-02-26 DIAGNOSIS — Z23 Encounter for immunization: Secondary | ICD-10-CM | POA: Diagnosis not present

## 2023-02-27 NOTE — Progress Notes (Signed)
Spoke to patient's daughter, Lanora Manis and instructed them to come at 10:15  and to be NPO after 0000. Patient will take Eliquis in the am prior to coming to hospital.   Confirmed that patient will have a ride home and someone to stay with them for 24 hours after the procedure.

## 2023-02-28 ENCOUNTER — Ambulatory Visit (HOSPITAL_COMMUNITY): Payer: Medicare Other | Admitting: Anesthesiology

## 2023-02-28 ENCOUNTER — Ambulatory Visit (HOSPITAL_COMMUNITY)
Admission: RE | Admit: 2023-02-28 | Discharge: 2023-02-28 | Disposition: A | Discharge status: Home / Self Care | Payer: Medicare Other | Attending: Internal Medicine | Admitting: Internal Medicine

## 2023-02-28 ENCOUNTER — Encounter (HOSPITAL_COMMUNITY)
Admission: RE | Disposition: A | Discharge status: Home / Self Care | Payer: Self-pay | Source: Home / Self Care | Attending: Internal Medicine

## 2023-02-28 DIAGNOSIS — I4891 Unspecified atrial fibrillation: Secondary | ICD-10-CM

## 2023-02-28 DIAGNOSIS — R001 Bradycardia, unspecified: Secondary | ICD-10-CM | POA: Diagnosis not present

## 2023-02-28 DIAGNOSIS — Z87891 Personal history of nicotine dependence: Secondary | ICD-10-CM | POA: Diagnosis not present

## 2023-02-28 DIAGNOSIS — I4819 Other persistent atrial fibrillation: Secondary | ICD-10-CM | POA: Diagnosis not present

## 2023-02-28 DIAGNOSIS — G309 Alzheimer's disease, unspecified: Secondary | ICD-10-CM | POA: Diagnosis not present

## 2023-02-28 DIAGNOSIS — I493 Ventricular premature depolarization: Secondary | ICD-10-CM | POA: Insufficient documentation

## 2023-02-28 DIAGNOSIS — D6869 Other thrombophilia: Secondary | ICD-10-CM | POA: Diagnosis not present

## 2023-02-28 DIAGNOSIS — Z7901 Long term (current) use of anticoagulants: Secondary | ICD-10-CM | POA: Insufficient documentation

## 2023-02-28 DIAGNOSIS — J449 Chronic obstructive pulmonary disease, unspecified: Secondary | ICD-10-CM | POA: Diagnosis not present

## 2023-02-28 HISTORY — PX: CARDIOVERSION: SHX1299

## 2023-02-28 SURGERY — CARDIOVERSION
Anesthesia: General

## 2023-02-28 MED ORDER — PROPOFOL 10 MG/ML IV BOLUS
INTRAVENOUS | Status: DC | PRN
  Administered 2023-02-28: 10 mg via INTRAVENOUS
  Administered 2023-02-28: 50 mg via INTRAVENOUS

## 2023-02-28 MED ORDER — SODIUM CHLORIDE 0.9 % IV SOLN: INTRAVENOUS | Status: DC

## 2023-02-28 MED ORDER — LIDOCAINE 2% (20 MG/ML) 5 ML SYRINGE
INTRAMUSCULAR | Status: DC | PRN
  Administered 2023-02-28: 20 mg via INTRAVENOUS

## 2023-02-28 SURGICAL SUPPLY — 1 items: ELECT DEFIB PAD ADLT CADENCE (PAD) ×1 IMPLANT

## 2023-02-28 NOTE — CV Procedure (Addendum)
Procedure: Electrical Cardioversion Indications:  Atrial Fibrillation  Procedure Details:  Consent: Risks of procedure as well as the alternatives and risks of each were explained to the (patient/caregiver).  Consent for procedure obtained.  Time Out: Verified patient identification, verified procedure, site/side was marked, verified correct patient position, special equipment/implants available, medications/allergies/relevent history reviewed, required imaging and test results available. PERFORMED.  Patient placed on cardiac monitor, pulse oximetry, supplemental oxygen as necessary.  Sedation given:  propofol per anesthesia Pacer pads placed anterior and posterior chest.  Cardioverted  5  time(s).  Cardioversion with synchronized biphasic 200J, pads repositioned 300J, 360J x 3 shock. Patient had 30 seconds of sinus rhythm after shock #4. Then reverted to Afib RVR.  Evaluation: Findings: Post procedure EKG shows: Atrial Fibrillation RVR Complications: None Patient did tolerate procedure well.  Time Spent Directly with the Patient:  30 minutes   [name] 02/28/2023, 11:38 AM

## 2023-02-28 NOTE — Anesthesia Preprocedure Evaluation (Signed)
Anesthesia Evaluation  Patient identified by MRN, date of birth, ID band Patient awake    Reviewed: Allergy & Precautions, NPO status , Patient's Chart, lab work & pertinent test results  History of Anesthesia Complications Negative for: history of anesthetic complications  Airway Mallampati: II  TM Distance: >3 FB Neck ROM: Full    Dental  (+) Dental Advisory Given   Pulmonary COPD,  COPD inhaler, former smoker   breath sounds clear to auscultation       Cardiovascular + dysrhythmias Atrial Fibrillation  Rhythm:Irregular Rate:Tachycardia  '17 ECHO: EF 55-60%. Wall motion was normal; there were no regional wall motion  abnormalities. No significant valvular abnormalities    Neuro/Psych    GI/Hepatic Neg liver ROS,GERD  Controlled,,  Endo/Other  negative endocrine ROS    Renal/GU negative Renal ROS     Musculoskeletal  (+) Arthritis ,    Abdominal   Peds  Hematology eliquis   Anesthesia Other Findings   Reproductive/Obstetrics                              Anesthesia Physical Anesthesia Plan  ASA: 3  Anesthesia Plan: General   Post-op Pain Management: Minimal or no pain anticipated   Induction: Intravenous  PONV Risk Score and Plan: Treatment may vary due to age or medical condition  Airway Management Planned: Natural Airway and Nasal Cannula  Additional Equipment: None  Intra-op Plan:   Post-operative Plan:   Informed Consent: I have reviewed the patients History and Physical, chart, labs and discussed the procedure including the risks, benefits and alternatives for the proposed anesthesia with the patient or authorized representative who has indicated his/her understanding and acceptance.     Dental advisory given  Plan Discussed with: CRNA and Surgeon  Anesthesia Plan Comments:          Anesthesia Quick Evaluation

## 2023-02-28 NOTE — Interval H&P Note (Signed)
History and Physical Interval Note:  02/28/2023 10:47 AM  [name]  has presented today for surgery, with the diagnosis of AFIB.  The various methods of treatment have been discussed with the patient and family. After consideration of risks, benefits and other options for treatment, the patient has consented to  Procedure(s): CARDIOVERSION (N/A) as a surgical intervention.  The patient's history has been reviewed, patient examined, no change in status, stable for surgery.  I have reviewed the patient's chart and labs.  Questions were answered to the patient's satisfaction.     [name]

## 2023-02-28 NOTE — Transfer of Care (Signed)
Immediate Anesthesia Transfer of Care Note  Patient: [name]  Procedure(s) Performed: CARDIOVERSION  Patient Location: Cath Lab  Anesthesia Type:General  Level of Consciousness: awake, alert , oriented, patient cooperative, and responds to stimulation  Airway & Oxygen Therapy: Patient Spontanous Breathing and Patient connected to nasal cannula oxygen  Post-op Assessment: Report given to RN and Post -op Vital signs reviewed and stable  Post vital signs: Reviewed and stable  Last Vitals:  Vitals Value Taken Time  BP 113/71 02/28/2023 11:41  Temp    Pulse 133 02/28/2023 11:41    Resp 15 02/28/2023 11:41    SpO2 98% 02/28/2023 11:41        Complications: No notable events documented.

## 2023-02-28 NOTE — Anesthesia Postprocedure Evaluation (Signed)
Anesthesia Post Note  Patient: [name]  Procedure(s) Performed: CARDIOVERSION     Patient location during evaluation: Cath Lab Anesthesia Type: General Level of consciousness: awake and alert, patient cooperative and oriented Pain management: pain level controlled Vital Signs Assessment: post-procedure vital signs reviewed and stable Respiratory status: spontaneous breathing, nonlabored ventilation and respiratory function stable Cardiovascular status: stable and blood pressure returned to baseline Postop Assessment: no apparent nausea or vomiting Anesthetic complications: no   No notable events documented.  Last Vitals:  Vitals:   02/28/23 1150 02/28/23 1155  BP: 105/73 111/85  Pulse: (!) 121 86  Resp: (!) 23 (!) 23  Temp:    SpO2: 98% 95%    Last Pain:  Vitals:   02/28/23 1145  TempSrc: Temporal  PainSc: 0-No pain                 Devontre Siedschlag,E. Lanai Conlee

## 2023-02-28 NOTE — Discharge Instructions (Signed)
 Electrical Cardioversion Electrical cardioversion is the delivery of a jolt of electricity to restore a normal rhythm to the heart. A rhythm that is too fast or is not regular (arrhythmia) keeps the heart from pumping blood well. There is also another type of cardioversion called a chemical (pharmacologic) cardioversion. This is when your health care provider gives you one or more medicines to bring back your regular heart rhythm. Electrical cardioversion is done as a scheduled procedure for arrhythmiasthat are not life-threatening. Electrical cardioversion may also be done in an emergency for sudden life-threatening arrhythmias. Tell a health care provider about: Any allergies you have. All medicines you are taking, including vitamins, herbs, eye drops, creams, and over-the-counter medicines. Any problems you or family members have had with sedatives or anesthesia. Any bleeding problems you have. Any surgeries you have had, including a pacemaker, defibrillator, or other implanted device. Any medical conditions you have. Whether you are pregnant or may be pregnant. What are the risks? Your provider will talk with you about risks. These include: Allergic reactions to medicines. Irritation to the skin on your chest or back where the sticky pads (electrodes) or paddles were put during electrical cardioversion. A blood clot that breaks free and travels to other parts of your body, such as your brain. Return of a worse abnormal heart rhythm that will need to be treated with medicines, a pacemaker, or an implantable cardioverter defibrillator (ICD). What happens before the procedure? Medicines Your provider may give you: Blood-thinning medicines (anticoagulants) so your blood does not clot as easily. If your provider gives you this medicine, you may need to take it for 4 weeks before the procedure. Medicines to help stabilize your heart rate and rhythm. Ask your provider about: Changing or stopping  your regular medicines. These include any diabetes medicines or blood thinners you take. Taking medicines such as aspirin and ibuprofen. These medicines can thin your blood. Do not take them unless your provider tells you to. Taking over-the-counter medicines, vitamins, herbs, and supplements. General instructions Follow instructions from your provider about what you may eat and drink. Do not put any lotions, powders, or ointments on your chest and back for 24 hours before the procedure. They can cause problems with the electrodes or paddles used to deliver electricity to your heart. Do not wear jewelry as this can interfere with delivering electricity to your heart. If you will be going home right after the procedure, plan to have a responsible adult: Take you home from the hospital or clinic. You will not be allowed to drive. Care for you for the time you are told. Tests You may have an exam or testing. This may include: Blood labs. A transesophageal echocardiogram (TEE). What happens during the procedure?     An IV will be inserted into one of your veins. You will be given a sedative. This helps you relax. Electrodes or metal paddles will be placed on your chest. They may be placed in one of these ways: One placed on your right chest, the other on the left ribs. One placed on your chest and the other on your back. An electrical shock will be delivered. The shock briefly stops (resets) your heart rhythm. Your provider will check to see if your heart rhythm is now normal. Some people need only one shock. Some need more to restore a normal heart rhythm. The procedure may vary among providers and hospitals. What happens after the procedure? Your blood pressure, heart rate, breathing rate, and blood oxygen  level will be monitored until you leave the hospital or clinic. Your heart rhythm will be watched to make sure it does not change. This information is not intended to replace advice  given to you by your health care provider. Make sure you discuss any questions you have with your health care provider. Document Revised: 01/05/2022 Document Reviewed: 01/05/2022 Elsevier Patient Education  2024 [employer].

## 2023-03-01 ENCOUNTER — Encounter (HOSPITAL_COMMUNITY): Payer: Self-pay | Admitting: Internal Medicine

## 2023-03-13 ENCOUNTER — Ambulatory Visit (HOSPITAL_COMMUNITY): Payer: Medicare Other | Admitting: Internal Medicine

## 2023-03-14 ENCOUNTER — Ambulatory Visit (HOSPITAL_COMMUNITY)
Admission: RE | Admit: 2023-03-14 | Discharge: 2023-03-14 | Disposition: A | Discharge status: Home / Self Care | Payer: Medicare Other | Source: Ambulatory Visit | Attending: Internal Medicine | Admitting: Internal Medicine

## 2023-03-14 VITALS — BP 92/64 | HR 119 | Ht 62.0 in | Wt 100.0 lb

## 2023-03-14 DIAGNOSIS — I4891 Unspecified atrial fibrillation: Secondary | ICD-10-CM | POA: Diagnosis not present

## 2023-03-14 DIAGNOSIS — Z7901 Long term (current) use of anticoagulants: Secondary | ICD-10-CM | POA: Insufficient documentation

## 2023-03-14 DIAGNOSIS — I4819 Other persistent atrial fibrillation: Secondary | ICD-10-CM | POA: Diagnosis not present

## 2023-03-14 DIAGNOSIS — I959 Hypotension, unspecified: Secondary | ICD-10-CM | POA: Diagnosis not present

## 2023-03-14 DIAGNOSIS — R9431 Abnormal electrocardiogram [ECG] [EKG]: Secondary | ICD-10-CM | POA: Insufficient documentation

## 2023-03-14 DIAGNOSIS — D6869 Other thrombophilia: Secondary | ICD-10-CM | POA: Insufficient documentation

## 2023-03-14 NOTE — Progress Notes (Signed)
Primary Care Physician: Thana Ates, MD Primary Cardiologist: None Electrophysiologist: Will Jorja Loa, MD     Referring Physician: Otilio Saber, PA-C     [name] is a 89 y.o. female with a history of bronchiectasis, PVCs, and persistent atrial fibrillation who presents for consultation in the [facility]. History of flecainide use stopped in 2017 due to transition to amiodarone for elevated PVC burden. Amiodarone discontinued with development of junctional rhythm. Seen by Mardelle Matte on 9/20 found to be in Afib with RVR. She underwent unsuccessful DCCV on 02/28/23; cardioverted 5 times with 200J, 300J, and 360J x 3 shocks. She had 30 seconds of sinus rhythm after shock #4 then reverted to Afib with RVR. Patient is on Eliquis 2.5 mg BID for a CHADS2VASC score of 3.  On evaluation today, she is currently in Afib with RVR. She feels very tired when in Afib. No chest pain or presyncope. No bleeding issues on Eliquis 2.5 mg BID.   Today, she denies symptoms of palpitations, shortness of breath, orthopnea, PND, lower extremity edema, dizziness, presyncope, syncope, snoring, daytime somnolence, bleeding, or neurologic sequela. The patient is tolerating medications without difficulties and is otherwise without complaint today.   she has a BMI of Body mass index is 18.29 kg/m.Marland Kitchen Filed Weights   03/14/23 1335  Weight: 45.4 kg    Current Outpatient Medications  Medication Sig Dispense Refill   apixaban (ELIQUIS) 2.5 MG TABS tablet TAKE 1 TABLET(2.5 MG) BY MOUTH TWICE DAILY 180 tablet 2   Cholecalciferol (VITAMIN D3) 2000 units TABS Take 1,000 Units by mouth daily. Gummy     ipratropium-albuterol (DUONEB) 0.5-2.5 (3) MG/3ML SOLN Take 3 mLs by nebulization every 6 (six) hours as needed. (Patient taking differently: Take 3 mLs by nebulization daily.) 360 mL 0   sertraline (ZOLOFT) 50 MG tablet Take 50 mg by mouth daily.     No current facility-administered  medications for this encounter.    Atrial Fibrillation Management history:  Previous antiarrhythmic drugs: flecainide, amiodarone Previous cardioversions: 08/31/15, 02/28/23 (unsuccessful) Previous ablations: none Anticoagulation history: Eliquis 2.5 mg BID   ROS- All systems are reviewed and negative except as per the HPI above.  Physical Exam: BP 92/64   Pulse (!) 119   Ht 5\' 2"  (1.575 m)   Wt 45.4 kg   BMI 18.29 kg/m   GEN: Well nourished, well developed in no acute distress NECK: No JVD; No carotid bruits CARDIAC: Irregularly irregular tachycardic rate and rhythm, no murmurs, rubs, gallops RESPIRATORY:  Clear to auscultation without rales, wheezing or rhonchi  ABDOMEN: Soft, non-tender, non-distended EXTREMITIES:  No edema; No deformity   EKG today demonstrates  Vent. rate 119 BPM PR interval * ms QRS duration 62 ms QT/QTcB 298/419 ms P-R-T axes * 168 106 Atrial fibrillation with rapid ventricular response Right axis deviation Anteroseptal infarct , age undetermined Abnormal ECG When compared with ECG of 28-Feb-2023 11:38, PREVIOUS ECG IS PRESENT  Echo 07/03/15 demonstrated  Study Conclusions   - Left ventricle: The cavity size was normal. Systolic function was    normal. The estimated ejection fraction was in the range of 55%    to 60%. Wall motion was normal; there were no regional wall    motion abnormalities.   ASSESSMENT & PLAN CHA2DS2-VASc Score = 3  The patient's score is based upon: CHF History: 0 HTN History: 0 Diabetes History: 0 Stroke History: 0 Vascular Disease History: 0 Age Score: 2 Gender Score: 1  ASSESSMENT AND PLAN: Persistent Atrial Fibrillation (ICD10:  I48.19) The patient's CHA2DS2-VASc score is 3, indicating a 3.2% annual risk of stroke.   S/p unsuccessful DCCV on 02/28/23.  She is currently in Afib with RVR.  I am unable to assist her with rate control due to her hypotension. Discussion with Dr. Elberta Fortis on possible  options for treatment. Amiodarone was previously stopped due to junctional rhythm. She only qualifies for Tikosyn 125 mg dose (CrCl 33 mL/min). After discussion, I will help assist her to be considered for PPM for tachy-brady syndrome.  Secondary Hypercoagulable State (ICD10:  D68.69) The patient is at significant risk for stroke/thromboembolism based upon her CHA2DS2-VASc Score of 3.  Continue Apixaban (Eliquis).  2.5 mg is correct dose, continue without interruption.   Will help arrange f/u with Dr. Elberta Fortis to discuss PPM as soon as possible.    L[place] Bells, PA-C  Afib Clinic [facility] [address] [place], [place] [address] [phone]

## 2023-03-20 ENCOUNTER — Ambulatory Visit: Payer: Medicare Other | Attending: Cardiology | Admitting: Cardiology

## 2023-03-20 ENCOUNTER — Encounter: Payer: Self-pay | Admitting: Cardiology

## 2023-03-20 VITALS — BP 104/70 | HR 116 | Ht 62.0 in | Wt 99.2 lb

## 2023-03-20 DIAGNOSIS — D6869 Other thrombophilia: Secondary | ICD-10-CM

## 2023-03-20 DIAGNOSIS — I493 Ventricular premature depolarization: Secondary | ICD-10-CM | POA: Diagnosis not present

## 2023-03-20 DIAGNOSIS — I4819 Other persistent atrial fibrillation: Secondary | ICD-10-CM

## 2023-03-20 MED ORDER — DILTIAZEM HCL ER COATED BEADS 120 MG PO CP24: 120.0000 mg | ORAL_CAPSULE | Freq: Every day | ORAL | 3 refills | Status: DC

## 2023-03-20 NOTE — Progress Notes (Signed)
  Electrophysiology Office Note:   Date:  03/20/2023  ID:  [name], DOB 1930/08/04, MRN [id number]  Primary Cardiologist: None Electrophysiologist: Clemence Lengyel Jorja Loa, MD      History of Present Illness:   [name] is a 89 y.o. female with h/o atrial fibrillation, PVCs seen today for routine electrophysiology followup.   Since last being seen in our clinic the patient reports doing overall well.  She has no chest pain, and mild shortness of breath and fatigue.  She remains in atrial fibrillation.  She did have an attempted cardioversion, and was converted to sinus rhythm, but quickly went back into atrial fibrillation.  Her daughter states that she sleeps until 4 in the afternoon, but does go to bed quite late.  She does have significant fatigue.  she denies chest pain, palpitations, dyspnea, PND, orthopnea, nausea, vomiting, dizziness, syncope, edema, weight gain, or early satiety.   Review of systems complete and found to be negative unless listed in HPI.   EP Information / Studies Reviewed:    EKG is not ordered today. EKG from 03/14/23 reviewed which showed AF with RVR        Risk Assessment/Calculations:    CHA2DS2-VASc Score = 3   This indicates a 3.2% annual risk of stroke. The patient's score is based upon: CHF History: 0 HTN History: 0 Diabetes History: 0 Stroke History: 0 Vascular Disease History: 0 Age Score: 2 Gender Score: 1             Physical Exam:   VS:  BP 104/70 (BP Location: Right Arm, Patient Position: Sitting, Cuff Size: Small)   Pulse (!) 116   Ht 5\' 2"  (1.575 m)   Wt 99 lb 3.2 oz (45 kg)   SpO2 97%   BMI 18.14 kg/m    Wt Readings from Last 3 Encounters:  03/20/23 99 lb 3.2 oz (45 kg)  03/14/23 100 lb (45.4 kg)  02/28/23 98 lb (44.5 kg)     GEN: Well nourished, well developed in no acute distress NECK: No JVD; No carotid bruits CARDIAC: Irregularly irregular rate and rhythm, no murmurs, rubs, gallops RESPIRATORY:  Clear to  auscultation without rales, wheezing or rhonchi  ABDOMEN: Soft, non-tender, non-distended EXTREMITIES:  No edema; No deformity   ASSESSMENT AND PLAN:   1.  Persistent atrial fibrillation: Has symptoms of fatigue.  At her age, I think a rate control strategy would likely be the best option.  She was tachycardic when she presented to clinic, but repeat heart rate is in the 70s.  It is likely that she is tachycardic when she exerts herself, but rates do improve with rest.  [name] start her on diltiazem 120 mg daily.  Her blood pressures are low, and thus I do not think that we can titrate this medication up much further than this.  If heart rates are well-controlled, [name] continue this management.  2.  PVCs: Minimal noted.  Continue with current management  3.  Secondary hypercoagulable state: Currently on Eliquis for atrial fibrillation  Follow up with Afib Clinic in 4 weeks  Signed, Candido Flott Jorja Loa, MD

## 2023-03-20 NOTE — Patient Instructions (Signed)
Medication Instructions:  Your physician has recommended you make the following change in your medication: START Diltiazem 120 mg once daily  *If you need a refill on your cardiac medications before your next appointment, please call your pharmacy*   Lab Work: None ordered   Testing/Procedures: None ordered   Follow-Up: At [facility], you and your health needs are our priority.  As part of our continuing mission to provide you with exceptional heart care, we have created designated Provider Care Teams.  These Care Teams include your primary Cardiologist (physician) and Advanced Practice Providers (APPs -  Physician Assistants and Nurse Practitioners) who all work together to provide you with the care you need, when you need it.  Your next appointment:   1 month(s)  The format for your next appointment:   In Person  Provider:   You will follow up in the Atrial Fibrillation Clinic located at [facility]. Your provider will be: Clint R. Fenton, PA-C or Lake Bells, PA-C    Thank you for choosing [facility]!!   Dory Horn, RN [phone]  Other Instructions

## 2023-04-12 ENCOUNTER — Encounter: Payer: Self-pay | Admitting: Cardiology

## 2023-04-13 NOTE — Telephone Encounter (Signed)
Called dtr to discuss. Aware this is not temporary unless pt is not tolerating it. Dtr did not realize there were refills on this medication.  States she will pick up refill and see afib clinic 11/25. She appreciates the call.

## 2023-04-23 ENCOUNTER — Ambulatory Visit (HOSPITAL_COMMUNITY)
Admission: RE | Admit: 2023-04-23 | Discharge: 2023-04-23 | Disposition: A | Discharge status: Home / Self Care | Payer: Medicare Other | Source: Ambulatory Visit | Attending: Internal Medicine | Admitting: Internal Medicine

## 2023-04-23 VITALS — BP 100/66 | HR 102 | Ht 62.0 in | Wt 95.8 lb

## 2023-04-23 DIAGNOSIS — Z7901 Long term (current) use of anticoagulants: Secondary | ICD-10-CM | POA: Insufficient documentation

## 2023-04-23 DIAGNOSIS — I4819 Other persistent atrial fibrillation: Secondary | ICD-10-CM | POA: Diagnosis not present

## 2023-04-23 DIAGNOSIS — Z79899 Other long term (current) drug therapy: Secondary | ICD-10-CM | POA: Diagnosis not present

## 2023-04-23 DIAGNOSIS — D6869 Other thrombophilia: Secondary | ICD-10-CM | POA: Diagnosis not present

## 2023-04-23 MED ORDER — DILTIAZEM HCL ER COATED BEADS 120 MG PO CP24: 120.0000 mg | ORAL_CAPSULE | Freq: Every day | ORAL | 3 refills | Status: DC

## 2023-04-23 NOTE — Progress Notes (Signed)
Primary Care Physician: Thana Ates, MD Primary Cardiologist: None Electrophysiologist: Will Jorja Loa, MD     Referring Physician: Otilio Saber, PA-C     [name] is a 89 y.o. female with a history of bronchiectasis, PVCs, and persistent atrial fibrillation who presents for consultation in the [facility]. History of flecainide use stopped in 2017 due to transition to amiodarone for elevated PVC burden. Amiodarone discontinued with development of junctional rhythm. Seen by Mardelle Matte on 9/20 found to be in Afib with RVR. She underwent unsuccessful DCCV on 02/28/23; cardioverted 5 times with 200J, 300J, and 360J x 3 shocks. She had 30 seconds of sinus rhythm after shock #4 then reverted to Afib with RVR. Patient is on Eliquis 2.5 mg BID for a CHADS2VASC score of 3.  On evaluation today, she is currently in Afib with RVR. She feels very tired when in Afib. No chest pain or presyncope. No bleeding issues on Eliquis 2.5 mg BID.   On follow up 04/23/23, she is currently in Afib. Seen by Dr. Elberta Fortis on 03/20/23 and after discussion, determination was to proceed with rate control. Patient was started on diltiazem 120 mg daily. She has been doing okay overall. She does note to be tired sometimes. No bleeding issues on Eliquis 2.5 mg BID.   Today, she denies symptoms of palpitations, shortness of breath, orthopnea, PND, lower extremity edema, dizziness, presyncope, syncope, snoring, daytime somnolence, bleeding, or neurologic sequela. The patient is tolerating medications without difficulties and is otherwise without complaint today.   she has a BMI of Body mass index is 17.52 kg/m.Marland Kitchen Filed Weights   04/23/23 1411  Weight: 43.5 kg     Current Outpatient Medications  Medication Sig Dispense Refill   apixaban (ELIQUIS) 2.5 MG TABS tablet TAKE 1 TABLET(2.5 MG) BY MOUTH TWICE DAILY 180 tablet 2   Cholecalciferol (VITAMIN D3) 2000 units TABS Take 1,000 Units by  mouth daily. Gummy     ipratropium-albuterol (DUONEB) 0.5-2.5 (3) MG/3ML SOLN Take 3 mLs by nebulization every 6 (six) hours as needed. (Patient taking differently: Take 3 mLs by nebulization daily.) 360 mL 0   sertraline (ZOLOFT) 50 MG tablet Take 50 mg by mouth daily.     diltiazem (CARDIZEM CD) 120 MG 24 hr capsule Take 1 capsule (120 mg total) by mouth daily. 90 capsule 3   No current facility-administered medications for this encounter.    Atrial Fibrillation Management history:  Previous antiarrhythmic drugs: flecainide, amiodarone Previous cardioversions: 08/31/15, 02/28/23 (unsuccessful) Previous ablations: none Anticoagulation history: Eliquis 2.5 mg BID   ROS- All systems are reviewed and negative except as per the HPI above.  Physical Exam: BP 100/66   Pulse (!) 102   Ht 5\' 2"  (1.575 m)   Wt 43.5 kg   BMI 17.52 kg/m   GEN- The patient is well appearing, alert and oriented x 3 today.   Neck - no JVD or carotid bruit noted Lungs- Clear to ausculation bilaterally, normal work of breathing Heart- Irregular rate and rhythm, no murmurs, rubs or gallops, PMI not laterally displaced Extremities- no clubbing, cyanosis, or edema Skin - no rash or ecchymosis noted   EKG today demonstrates  Vent. rate 102 BPM PR interval * ms QRS duration 72 ms QT/QTcB 290/377 ms P-R-T axes * 165 -80 Atrial fibrillation with rapid ventricular response Left posterior fascicular block Cannot rule out Anteroseptal infarct , age undetermined ST & T wave abnormality, consider inferolateral ischemia Abnormal ECG When compared  with ECG of 14-Mar-2023 13:48, PREVIOUS ECG IS PRESENT  Echo 07/03/15 demonstrated  Study Conclusions   - Left ventricle: The cavity size was normal. Systolic function was    normal. The estimated ejection fraction was in the range of 55%    to 60%. Wall motion was normal; there were no regional wall    motion abnormalities.   ASSESSMENT & PLAN CHA2DS2-VASc Score = 3   The patient's score is based upon: CHF History: 0 HTN History: 0 Diabetes History: 0 Stroke History: 0 Vascular Disease History: 0 Age Score: 2 Gender Score: 1      ASSESSMENT AND PLAN: Persistent Atrial Fibrillation (ICD10:  I48.19) The patient's CHA2DS2-VASc score is 3, indicating a 3.2% annual risk of stroke.   S/p unsuccessful DCCV on 02/28/23.  She is currently in Afib. Patient was started on diltiazem 120 mg daily for rate control strategy after office visit with Dr. Elberta Fortis on 03/20/23.   Continue diltiazem 120 mg daily.   Secondary Hypercoagulable State (ICD10:  D68.69) The patient is at significant risk for stroke/thromboembolism based upon her CHA2DS2-VASc Score of 3.  Continue Apixaban (Eliquis).  2.5 mg is correct dose based on age and weight; continue without interruption.    Follow up 1 year Afib clinic.   Lake Bells, PA-C  Afib Clinic [facility] [address] [place], [place] [address] [phone]

## 2023-05-03 ENCOUNTER — Telehealth: Payer: Self-pay | Admitting: Pulmonary Disease

## 2023-05-03 DIAGNOSIS — J479 Bronchiectasis, uncomplicated: Secondary | ICD-10-CM

## 2023-05-03 NOTE — Telephone Encounter (Signed)
Daughter is wondering if patient can receive enough nebulizer medication to hold he until her next appointment which is at the end of January.

## 2023-05-08 NOTE — Telephone Encounter (Signed)
Dr. Tonia Brooms are you ok to fill neb meds until next ov end of Jan?

## 2023-05-09 DIAGNOSIS — F02C0 Dementia in other diseases classified elsewhere, severe, without behavioral disturbance, psychotic disturbance, mood disturbance, and anxiety: Secondary | ICD-10-CM | POA: Diagnosis not present

## 2023-05-09 DIAGNOSIS — J449 Chronic obstructive pulmonary disease, unspecified: Secondary | ICD-10-CM | POA: Diagnosis not present

## 2023-05-09 DIAGNOSIS — J479 Bronchiectasis, uncomplicated: Secondary | ICD-10-CM | POA: Diagnosis not present

## 2023-05-09 DIAGNOSIS — I480 Paroxysmal atrial fibrillation: Secondary | ICD-10-CM | POA: Diagnosis not present

## 2023-05-09 DIAGNOSIS — R2689 Other abnormalities of gait and mobility: Secondary | ICD-10-CM | POA: Diagnosis not present

## 2023-05-09 DIAGNOSIS — G301 Alzheimer's disease with late onset: Secondary | ICD-10-CM | POA: Diagnosis not present

## 2023-05-10 MED ORDER — IPRATROPIUM-ALBUTEROL 0.5-2.5 (3) MG/3ML IN SOLN: 3.0000 mL | Freq: Four times a day (QID) | RESPIRATORY_TRACT | 0 refills | Status: DC | PRN

## 2023-05-10 NOTE — Telephone Encounter (Signed)
Notified pts daughter one month supply sent to [facility] [place].

## 2023-05-12 DIAGNOSIS — M170 Bilateral primary osteoarthritis of knee: Secondary | ICD-10-CM | POA: Diagnosis not present

## 2023-05-12 DIAGNOSIS — Z7951 Long term (current) use of inhaled steroids: Secondary | ICD-10-CM | POA: Diagnosis not present

## 2023-05-12 DIAGNOSIS — Z7901 Long term (current) use of anticoagulants: Secondary | ICD-10-CM | POA: Diagnosis not present

## 2023-05-12 DIAGNOSIS — K219 Gastro-esophageal reflux disease without esophagitis: Secondary | ICD-10-CM | POA: Diagnosis not present

## 2023-05-12 DIAGNOSIS — F02C0 Dementia in other diseases classified elsewhere, severe, without behavioral disturbance, psychotic disturbance, mood disturbance, and anxiety: Secondary | ICD-10-CM | POA: Diagnosis not present

## 2023-05-12 DIAGNOSIS — J449 Chronic obstructive pulmonary disease, unspecified: Secondary | ICD-10-CM | POA: Diagnosis not present

## 2023-05-12 DIAGNOSIS — I480 Paroxysmal atrial fibrillation: Secondary | ICD-10-CM | POA: Diagnosis not present

## 2023-05-12 DIAGNOSIS — Z556 Problems related to health literacy: Secondary | ICD-10-CM | POA: Diagnosis not present

## 2023-05-12 DIAGNOSIS — G301 Alzheimer's disease with late onset: Secondary | ICD-10-CM | POA: Diagnosis not present

## 2023-05-12 DIAGNOSIS — J479 Bronchiectasis, uncomplicated: Secondary | ICD-10-CM | POA: Diagnosis not present

## 2023-05-18 ENCOUNTER — Encounter: Payer: Self-pay | Admitting: Nurse Practitioner

## 2023-05-18 ENCOUNTER — Ambulatory Visit: Payer: Medicare Other | Admitting: Nurse Practitioner

## 2023-05-18 VITALS — BP 100/60 | HR 97 | Ht 62.0 in | Wt 92.0 lb

## 2023-05-18 DIAGNOSIS — J449 Chronic obstructive pulmonary disease, unspecified: Secondary | ICD-10-CM | POA: Diagnosis not present

## 2023-05-18 DIAGNOSIS — J479 Bronchiectasis, uncomplicated: Secondary | ICD-10-CM

## 2023-05-18 MED ORDER — IPRATROPIUM-ALBUTEROL 0.5-2.5 (3) MG/3ML IN SOLN
3.0000 mL | Freq: Four times a day (QID) | RESPIRATORY_TRACT | 2 refills | Status: DC | PRN
Start: 2023-05-18 — End: 2023-05-18

## 2023-05-18 MED ORDER — IPRATROPIUM-ALBUTEROL 0.5-2.5 (3) MG/3ML IN SOLN
3.0000 mL | Freq: Four times a day (QID) | RESPIRATORY_TRACT | 1 refills | Status: DC | PRN
Start: 2023-05-18 — End: 2023-05-18

## 2023-05-18 MED ORDER — IPRATROPIUM-ALBUTEROL 0.5-2.5 (3) MG/3ML IN SOLN: 3.0000 mL | Freq: Four times a day (QID) | RESPIRATORY_TRACT | 2 refills | Status: DC | PRN

## 2023-05-18 NOTE — Assessment & Plan Note (Signed)
See above

## 2023-05-18 NOTE — Patient Instructions (Signed)
Continue duoneb 3 mL every 6 hours as needed for shortness of breath, wheezing, cough Use flutter valve as needed for cough/chest congestion  Duoneb refilled  Follow up in one year with Dr. Tonia Brooms or Philis Nettle. If symptoms worsen, please contact office for sooner follow up or seek emergency care.

## 2023-05-18 NOTE — Progress Notes (Signed)
@Patient  ID: [name], female    DOB: [birth year], 89 y.o.   MRN: [id number]  Chief Complaint  Patient presents with   Follow-up    Medication refill    Referring provider: Thana Ates, MD  HPI: 89 year old female, former smoker followed for obstructive lung disease and bronchiectasis.  She is a patient Dr. Myrlene Broker and last seen in office 01/12/2022.  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  Past medical history significant for A-fib on Eliquis.  TEST/EVENTS:  01/05/2015 HRCT chest: No ILD.  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  Slightly progressive since 2007.  Mild dilatation of pulmonic trunk.  Atherosclerosis 11/10/2014 PFT: FVC 78, FEV1 53, ratio 57, TLC 98, DLCOunc 43.  Moderately severe obstructive airway disease with reversibility.  Normal lung volumes.  Severe diffusing defect.  01/12/2022: OV with Dr. Tonia Brooms.  Longstanding history of mild bronchiectasis with recurrent exacerbations and pneumonias in the 1990s.  Doing much better since then.  No issues this past year.  Off all inhaled steroids.  Was using Anoro for short time but the medication cost was too much so switched to DuoNebs.  Doing well with the DuoNebs at least once a day.  Not really using a flutter valve but not having any significant sputum production.  Continue to monitor.  05/18/2023: Today-follow-up Patient presents today with her daughter for follow-up.  She has been doing well for the most part since she was last seen.  She did have issues with atrial fibrillation and they attempted cardioversion in October but this was unsuccessful.  She has been able to maintain appropriate rate control with diltiazem.  She is anticoagulated with Eliquis.  Regarding her breathing, she is not having any issues.  No significant cough.  No sputum production.  Has not had any fevers, chills, hemoptysis.  No issues with weight loss or anorexia.  With her age, they are  just trying to maintain her the best they can and not looking for any sort of aggressive regimen.  She uses DuoNebs once a day.  Needs refills of these.  Allergies  Allergen Reactions   Augmentin [Amoxicillin-Pot Clavulanate] Nausea And Vomiting   Biaxin [Clarithromycin] Nausea And Vomiting   Metoprolol Other (See Comments)    Patient notes she "feels funny" after taking it   Tequin [Gatifloxacin] Nausea Only    Immunization History  Administered Date(s) Administered   DTaP 12/13/2003   Fluad Quad(high Dose 65+) 03/18/2019   Hepatitis B, PED/ADOLESCENT 12/13/1978   Influenza Split 02/19/2009, 02/23/2010, 03/06/2011, 03/25/2012, 03/27/2014, 03/28/2017   Influenza, High Dose Seasonal PF 02/27/2016, 02/18/2018   Influenza,inj,Quad PF,6+ Mos 03/28/2017   Influenza-Unspecified 03/01/2015   PFIZER(Purple Top)SARS-COV-2 Vaccination 06/21/2019, 07/12/2019, 04/09/2020   PNEUMOCOCCAL CONJUGATE-20 06/27/2021   Pfizer Covid-19 Vaccine Bivalent Booster 65yrs & up 03/14/2021   Pneumococcal Conjugate-13 10/15/2013   Pneumococcal Polysaccharide-23 12/20/2003, 03/17/2016   Pneumococcal-Unspecified 02/27/2016    Past Medical History:  Diagnosis Date   Bronchiectasis (HCC)    with multiple pneumonia back in 1990's   Bronchitis    episodic   DJD (degenerative joint disease) of knee    GERD (gastroesophageal reflux disease)    episodic symptoms   Hearing loss    chronic moderate hearing loss, worked up by ENT and hearing aids have been recommended   History of nuclear stress test 11/07   NORMAL   Insomnia    PVC's (premature ventricular contractions)    on EKG, controlled on  atenolol and caffeine reduction   Tinnitus    chronic    Tobacco History: Social History   Tobacco Use  Smoking Status Former   Current packs/day: 0.00   Average packs/day: 0.5 packs/day for 20.0 years (10.0 ttl pk-yrs)   Types: Cigarettes   Start date: 05/29/1948   Quit date: 05/29/1968   Years since quitting:  55.0  Smokeless Tobacco Never   Counseling given: Not Answered   Outpatient Medications Prior to Visit  Medication Sig Dispense Refill   apixaban (ELIQUIS) 2.5 MG TABS tablet TAKE 1 TABLET(2.5 MG) BY MOUTH TWICE DAILY 180 tablet 2   Cholecalciferol (VITAMIN D3) 2000 units TABS Take 1,000 Units by mouth daily. Gummy     diltiazem (CARDIZEM CD) 120 MG 24 hr capsule Take 1 capsule (120 mg total) by mouth daily. 90 capsule 3   sertraline (ZOLOFT) 50 MG tablet Take 50 mg by mouth daily.     ipratropium-albuterol (DUONEB) 0.5-2.5 (3) MG/3ML SOLN Take 3 mLs by nebulization every 6 (six) hours as needed. 360 mL 0   No facility-administered medications prior to visit.     Review of Systems:   Constitutional: No weight loss or gain, night sweats, fevers, chills, fatigue, or lassitude. HEENT: No headaches, difficulty swallowing, tooth/dental problems, or sore throat. No sneezing, itching, ear ache, nasal congestion, or post nasal drip CV:  No chest pain, orthopnea, PND, swelling in lower extremities, anasarca, dizziness, palpitations, syncope Resp: No shortness of breath with exertion or at rest. No excess mucus or change in color of mucus. No productive or non-productive. No hemoptysis. No wheezing.  No chest wall deformity GI:  No heartburn, indigestion GU: No dysuria, change in color of urine, urgency or frequency.  No flank pain, no hematuria  Skin: No rash, lesions, ulcerations MSK:  No joint pain or swelling.   Neuro: No dizziness or lightheadedness.  Psych: No depression or anxiety. Mood stable.     Physical Exam:  BP 100/60 (BP Location: Right Arm, Patient Position: Sitting, Cuff Size: Normal)   Pulse 97   Ht 5\' 2"  (1.575 m)   Wt 92 lb (41.7 kg)   SpO2 95%   BMI 16.83 kg/m   GEN: Pleasant, interactive, well-kempt; elderly; in no acute distress HEENT:  Normocephalic and atraumatic. PERRLA. Sclera white. Nasal turbinates pink, moist and patent bilaterally. No rhinorrhea  present. Oropharynx pink and moist, without exudate or edema. No lesions, ulcerations, or postnasal drip.  NECK:  Supple w/ fair ROM. No JVD present. Thyroid symmetrical with no goiter or nodules palpated. No lymphadenopathy.   CV: Irregular rhythm, rate controlled, no m/r/g, no peripheral edema. Pulses intact, +2 bilaterally. No cyanosis, pallor or clubbing. PULMONARY:  Unlabored, regular breathing. Clear bilaterally A&P w/o wheezes/rales/rhonchi. No accessory muscle use.  GI: BS present and normoactive. Soft, non-tender to palpation. No organomegaly or masses detected.  MSK: No erythema, warmth or tenderness. Cap refil <2 sec all extrem. No deformities or joint swelling noted.  Neuro: A/Ox3. No focal deficits noted.   Skin: Warm, no lesions or rashe Psych: Normal affect and behavior. Judgement and thought content appropriate.     Lab Results:  CBC    Component Value Date/Time   WBC 5.1 02/16/2023 1302   WBC 6.1 06/26/2022 1950   RBC 4.74 02/16/2023 1302   RBC 4.79 06/26/2022 1950   HGB 13.9 02/16/2023 1302   HCT 43.8 02/16/2023 1302   PLT 199 02/16/2023 1302   MCV 92 02/16/2023 1302   MCH 29.3 02/16/2023  1302   MCH 29.4 06/26/2022 1950   MCHC 31.7 02/16/2023 1302   MCHC 32.6 06/26/2022 1950   RDW 13.5 02/16/2023 1302   LYMPHSABS 1.4 06/26/2022 1950   LYMPHSABS 1.4 09/07/2017 1501   MONOABS 0.4 06/26/2022 1950   EOSABS 0.1 06/26/2022 1950   EOSABS 0.1 09/07/2017 1501   BASOSABS 0.0 06/26/2022 1950   BASOSABS 0.0 09/07/2017 1501    BMET    Component Value Date/Time   NA 144 02/16/2023 1302   K 4.2 02/16/2023 1302   CL 102 02/16/2023 1302   CO2 25 02/16/2023 1302   GLUCOSE 92 02/16/2023 1302   GLUCOSE 98 06/26/2022 1950   BUN 14 02/16/2023 1302   CREATININE 0.79 02/16/2023 1302   CREATININE 0.75 02/23/2016 1427   CALCIUM 9.5 02/16/2023 1302   GFRNONAA >60 06/26/2022 1950   GFRAA 79 03/29/2020 1435    BNP    Component Value Date/Time   BNP 54.2 10/15/2015 1535      Imaging:  No results found.  Administration History     None          Latest Ref Rng & Units 11/10/2014    3:51 PM  PFT Results  FVC-Pre L 1.80   FVC-Predicted Pre % 78   FVC-Post L 1.91   FVC-Predicted Post % 83   Pre FEV1/FVC % % 50   Post FEV1/FCV % % 57   FEV1-Pre L 0.90   FEV1-Predicted Pre % 53   FEV1-Post L 1.08   DLCO uncorrected ml/min/mmHg 9.33   DLCO UNC% % 43   DLVA Predicted % 91   TLC L 4.66   TLC % Predicted % 98   RV % Predicted % 117     No results found for: "NITRICOXIDE"      Assessment & Plan:   Bronchiectasis Compensated on current regimen.  No recent exacerbations.  Utilizing DuoNeb once daily.  Has not required flutter valve recently.  Will continue mucociliary clearance therapies as needed.  Understands to notify if she develops worsening respiratory symptoms.  Action plan in place.  Patient Instructions  Continue duoneb 3 mL every 6 hours as needed for shortness of breath, wheezing, cough Use flutter valve as needed for cough/chest congestion  Duoneb refilled  Follow up in one year with Dr. Tonia Brooms or Philis Nettle. If symptoms worsen, please contact office for sooner follow up or seek emergency care.    COPD mixed type See above   Advised if symptoms do not improve or worsen, to please contact office for sooner follow up or seek emergency care.   I spent 25 minutes of dedicated to the care of this patient on the date of this encounter to include pre-visit review of records, face-to-face time with the patient discussing conditions above, post visit ordering of testing, clinical documentation with the electronic health record, making appropriate referrals as documented, and communicating necessary findings to members of the patients care team.  Noemi Chapel, NP 05/18/2023  Pt aware and understands NP's role.

## 2023-05-18 NOTE — Assessment & Plan Note (Signed)
Compensated on current regimen.  No recent exacerbations.  Utilizing DuoNeb once daily.  Has not required flutter valve recently.  Will continue mucociliary clearance therapies as needed.  Understands to notify if she develops worsening respiratory symptoms.  Action plan in place.  Patient Instructions  Continue duoneb 3 mL every 6 hours as needed for shortness of breath, wheezing, cough Use flutter valve as needed for cough/chest congestion  Duoneb refilled  Follow up in one year with Dr. Tonia Brooms or Philis Nettle. If symptoms worsen, please contact office for sooner follow up or seek emergency care.

## 2023-05-22 DIAGNOSIS — J479 Bronchiectasis, uncomplicated: Secondary | ICD-10-CM | POA: Diagnosis not present

## 2023-05-22 DIAGNOSIS — G301 Alzheimer's disease with late onset: Secondary | ICD-10-CM | POA: Diagnosis not present

## 2023-05-22 DIAGNOSIS — J449 Chronic obstructive pulmonary disease, unspecified: Secondary | ICD-10-CM | POA: Diagnosis not present

## 2023-05-22 DIAGNOSIS — I480 Paroxysmal atrial fibrillation: Secondary | ICD-10-CM | POA: Diagnosis not present

## 2023-05-22 DIAGNOSIS — F02C0 Dementia in other diseases classified elsewhere, severe, without behavioral disturbance, psychotic disturbance, mood disturbance, and anxiety: Secondary | ICD-10-CM | POA: Diagnosis not present

## 2023-05-22 DIAGNOSIS — M170 Bilateral primary osteoarthritis of knee: Secondary | ICD-10-CM | POA: Diagnosis not present

## 2023-05-29 DIAGNOSIS — I480 Paroxysmal atrial fibrillation: Secondary | ICD-10-CM | POA: Diagnosis not present

## 2023-05-29 DIAGNOSIS — J449 Chronic obstructive pulmonary disease, unspecified: Secondary | ICD-10-CM | POA: Diagnosis not present

## 2023-05-29 DIAGNOSIS — M170 Bilateral primary osteoarthritis of knee: Secondary | ICD-10-CM | POA: Diagnosis not present

## 2023-05-29 DIAGNOSIS — F02C0 Dementia in other diseases classified elsewhere, severe, without behavioral disturbance, psychotic disturbance, mood disturbance, and anxiety: Secondary | ICD-10-CM | POA: Diagnosis not present

## 2023-05-29 DIAGNOSIS — G301 Alzheimer's disease with late onset: Secondary | ICD-10-CM | POA: Diagnosis not present

## 2023-05-29 DIAGNOSIS — J479 Bronchiectasis, uncomplicated: Secondary | ICD-10-CM | POA: Diagnosis not present

## 2023-06-05 DIAGNOSIS — I480 Paroxysmal atrial fibrillation: Secondary | ICD-10-CM | POA: Diagnosis not present

## 2023-06-05 DIAGNOSIS — J479 Bronchiectasis, uncomplicated: Secondary | ICD-10-CM | POA: Diagnosis not present

## 2023-06-05 DIAGNOSIS — F02C0 Dementia in other diseases classified elsewhere, severe, without behavioral disturbance, psychotic disturbance, mood disturbance, and anxiety: Secondary | ICD-10-CM | POA: Diagnosis not present

## 2023-06-05 DIAGNOSIS — M170 Bilateral primary osteoarthritis of knee: Secondary | ICD-10-CM | POA: Diagnosis not present

## 2023-06-05 DIAGNOSIS — G301 Alzheimer's disease with late onset: Secondary | ICD-10-CM | POA: Diagnosis not present

## 2023-06-05 DIAGNOSIS — J449 Chronic obstructive pulmonary disease, unspecified: Secondary | ICD-10-CM | POA: Diagnosis not present

## 2023-06-06 ENCOUNTER — Ambulatory Visit: Payer: Medicare Other | Admitting: Physician Assistant

## 2023-06-11 DIAGNOSIS — Z7951 Long term (current) use of inhaled steroids: Secondary | ICD-10-CM | POA: Diagnosis not present

## 2023-06-11 DIAGNOSIS — Z556 Problems related to health literacy: Secondary | ICD-10-CM | POA: Diagnosis not present

## 2023-06-11 DIAGNOSIS — J449 Chronic obstructive pulmonary disease, unspecified: Secondary | ICD-10-CM | POA: Diagnosis not present

## 2023-06-11 DIAGNOSIS — F02C0 Dementia in other diseases classified elsewhere, severe, without behavioral disturbance, psychotic disturbance, mood disturbance, and anxiety: Secondary | ICD-10-CM | POA: Diagnosis not present

## 2023-06-11 DIAGNOSIS — K219 Gastro-esophageal reflux disease without esophagitis: Secondary | ICD-10-CM | POA: Diagnosis not present

## 2023-06-11 DIAGNOSIS — J479 Bronchiectasis, uncomplicated: Secondary | ICD-10-CM | POA: Diagnosis not present

## 2023-06-11 DIAGNOSIS — G301 Alzheimer's disease with late onset: Secondary | ICD-10-CM | POA: Diagnosis not present

## 2023-06-11 DIAGNOSIS — M170 Bilateral primary osteoarthritis of knee: Secondary | ICD-10-CM | POA: Diagnosis not present

## 2023-06-11 DIAGNOSIS — Z7901 Long term (current) use of anticoagulants: Secondary | ICD-10-CM | POA: Diagnosis not present

## 2023-06-11 DIAGNOSIS — I480 Paroxysmal atrial fibrillation: Secondary | ICD-10-CM | POA: Diagnosis not present

## 2023-06-15 DIAGNOSIS — F02C0 Dementia in other diseases classified elsewhere, severe, without behavioral disturbance, psychotic disturbance, mood disturbance, and anxiety: Secondary | ICD-10-CM | POA: Diagnosis not present

## 2023-06-15 DIAGNOSIS — J449 Chronic obstructive pulmonary disease, unspecified: Secondary | ICD-10-CM | POA: Diagnosis not present

## 2023-06-15 DIAGNOSIS — G301 Alzheimer's disease with late onset: Secondary | ICD-10-CM | POA: Diagnosis not present

## 2023-06-15 DIAGNOSIS — I480 Paroxysmal atrial fibrillation: Secondary | ICD-10-CM | POA: Diagnosis not present

## 2023-06-15 DIAGNOSIS — M170 Bilateral primary osteoarthritis of knee: Secondary | ICD-10-CM | POA: Diagnosis not present

## 2023-06-15 DIAGNOSIS — J479 Bronchiectasis, uncomplicated: Secondary | ICD-10-CM | POA: Diagnosis not present

## 2023-06-18 ENCOUNTER — Ambulatory Visit: Payer: Medicare Other | Admitting: Nurse Practitioner

## 2023-06-19 DIAGNOSIS — F02C0 Dementia in other diseases classified elsewhere, severe, without behavioral disturbance, psychotic disturbance, mood disturbance, and anxiety: Secondary | ICD-10-CM | POA: Diagnosis not present

## 2023-06-19 DIAGNOSIS — I480 Paroxysmal atrial fibrillation: Secondary | ICD-10-CM | POA: Diagnosis not present

## 2023-06-19 DIAGNOSIS — J479 Bronchiectasis, uncomplicated: Secondary | ICD-10-CM | POA: Diagnosis not present

## 2023-06-19 DIAGNOSIS — M170 Bilateral primary osteoarthritis of knee: Secondary | ICD-10-CM | POA: Diagnosis not present

## 2023-06-19 DIAGNOSIS — G301 Alzheimer's disease with late onset: Secondary | ICD-10-CM | POA: Diagnosis not present

## 2023-06-19 DIAGNOSIS — J449 Chronic obstructive pulmonary disease, unspecified: Secondary | ICD-10-CM | POA: Diagnosis not present

## 2023-06-26 DIAGNOSIS — F02C0 Dementia in other diseases classified elsewhere, severe, without behavioral disturbance, psychotic disturbance, mood disturbance, and anxiety: Secondary | ICD-10-CM | POA: Diagnosis not present

## 2023-06-26 DIAGNOSIS — G301 Alzheimer's disease with late onset: Secondary | ICD-10-CM | POA: Diagnosis not present

## 2023-06-26 DIAGNOSIS — J449 Chronic obstructive pulmonary disease, unspecified: Secondary | ICD-10-CM | POA: Diagnosis not present

## 2023-06-26 DIAGNOSIS — I480 Paroxysmal atrial fibrillation: Secondary | ICD-10-CM | POA: Diagnosis not present

## 2023-06-26 DIAGNOSIS — M170 Bilateral primary osteoarthritis of knee: Secondary | ICD-10-CM | POA: Diagnosis not present

## 2023-06-26 DIAGNOSIS — J479 Bronchiectasis, uncomplicated: Secondary | ICD-10-CM | POA: Diagnosis not present

## 2023-07-07 DIAGNOSIS — F02C0 Dementia in other diseases classified elsewhere, severe, without behavioral disturbance, psychotic disturbance, mood disturbance, and anxiety: Secondary | ICD-10-CM | POA: Diagnosis not present

## 2023-07-07 DIAGNOSIS — I480 Paroxysmal atrial fibrillation: Secondary | ICD-10-CM | POA: Diagnosis not present

## 2023-07-07 DIAGNOSIS — J449 Chronic obstructive pulmonary disease, unspecified: Secondary | ICD-10-CM | POA: Diagnosis not present

## 2023-07-07 DIAGNOSIS — M170 Bilateral primary osteoarthritis of knee: Secondary | ICD-10-CM | POA: Diagnosis not present

## 2023-07-07 DIAGNOSIS — J479 Bronchiectasis, uncomplicated: Secondary | ICD-10-CM | POA: Diagnosis not present

## 2023-07-07 DIAGNOSIS — G301 Alzheimer's disease with late onset: Secondary | ICD-10-CM | POA: Diagnosis not present

## 2023-07-11 DIAGNOSIS — I480 Paroxysmal atrial fibrillation: Secondary | ICD-10-CM | POA: Diagnosis not present

## 2023-07-11 DIAGNOSIS — F02C18 Dementia in other diseases classified elsewhere, severe, with other behavioral disturbance: Secondary | ICD-10-CM | POA: Diagnosis not present

## 2023-07-11 DIAGNOSIS — J479 Bronchiectasis, uncomplicated: Secondary | ICD-10-CM | POA: Diagnosis not present

## 2023-07-11 DIAGNOSIS — J449 Chronic obstructive pulmonary disease, unspecified: Secondary | ICD-10-CM | POA: Diagnosis not present

## 2023-07-11 DIAGNOSIS — G301 Alzheimer's disease with late onset: Secondary | ICD-10-CM | POA: Diagnosis not present

## 2023-07-11 DIAGNOSIS — M170 Bilateral primary osteoarthritis of knee: Secondary | ICD-10-CM | POA: Diagnosis not present

## 2023-07-11 DIAGNOSIS — G4700 Insomnia, unspecified: Secondary | ICD-10-CM | POA: Diagnosis not present

## 2023-07-11 DIAGNOSIS — K219 Gastro-esophageal reflux disease without esophagitis: Secondary | ICD-10-CM | POA: Diagnosis not present

## 2023-07-11 DIAGNOSIS — Z556 Problems related to health literacy: Secondary | ICD-10-CM | POA: Diagnosis not present

## 2023-07-12 DIAGNOSIS — F02C18 Dementia in other diseases classified elsewhere, severe, with other behavioral disturbance: Secondary | ICD-10-CM | POA: Diagnosis not present

## 2023-07-12 DIAGNOSIS — I480 Paroxysmal atrial fibrillation: Secondary | ICD-10-CM | POA: Diagnosis not present

## 2023-07-12 DIAGNOSIS — J449 Chronic obstructive pulmonary disease, unspecified: Secondary | ICD-10-CM | POA: Diagnosis not present

## 2023-07-12 DIAGNOSIS — J479 Bronchiectasis, uncomplicated: Secondary | ICD-10-CM | POA: Diagnosis not present

## 2023-07-12 DIAGNOSIS — G4700 Insomnia, unspecified: Secondary | ICD-10-CM | POA: Diagnosis not present

## 2023-07-12 DIAGNOSIS — G301 Alzheimer's disease with late onset: Secondary | ICD-10-CM | POA: Diagnosis not present

## 2023-07-16 DIAGNOSIS — G301 Alzheimer's disease with late onset: Secondary | ICD-10-CM | POA: Diagnosis not present

## 2023-07-16 DIAGNOSIS — J479 Bronchiectasis, uncomplicated: Secondary | ICD-10-CM | POA: Diagnosis not present

## 2023-07-16 DIAGNOSIS — J449 Chronic obstructive pulmonary disease, unspecified: Secondary | ICD-10-CM | POA: Diagnosis not present

## 2023-07-16 DIAGNOSIS — I480 Paroxysmal atrial fibrillation: Secondary | ICD-10-CM | POA: Diagnosis not present

## 2023-07-16 DIAGNOSIS — G4700 Insomnia, unspecified: Secondary | ICD-10-CM | POA: Diagnosis not present

## 2023-07-16 DIAGNOSIS — F02C18 Dementia in other diseases classified elsewhere, severe, with other behavioral disturbance: Secondary | ICD-10-CM | POA: Diagnosis not present

## 2023-07-17 DIAGNOSIS — F02B0 Dementia in other diseases classified elsewhere, moderate, without behavioral disturbance, psychotic disturbance, mood disturbance, and anxiety: Secondary | ICD-10-CM | POA: Diagnosis not present

## 2023-07-26 DIAGNOSIS — I480 Paroxysmal atrial fibrillation: Secondary | ICD-10-CM | POA: Diagnosis not present

## 2023-07-26 DIAGNOSIS — J449 Chronic obstructive pulmonary disease, unspecified: Secondary | ICD-10-CM | POA: Diagnosis not present

## 2023-07-26 DIAGNOSIS — J479 Bronchiectasis, uncomplicated: Secondary | ICD-10-CM | POA: Diagnosis not present

## 2023-07-26 DIAGNOSIS — G301 Alzheimer's disease with late onset: Secondary | ICD-10-CM | POA: Diagnosis not present

## 2023-07-26 DIAGNOSIS — F02C18 Dementia in other diseases classified elsewhere, severe, with other behavioral disturbance: Secondary | ICD-10-CM | POA: Diagnosis not present

## 2023-07-26 DIAGNOSIS — G4700 Insomnia, unspecified: Secondary | ICD-10-CM | POA: Diagnosis not present

## 2023-07-28 DIAGNOSIS — F02B0 Dementia in other diseases classified elsewhere, moderate, without behavioral disturbance, psychotic disturbance, mood disturbance, and anxiety: Secondary | ICD-10-CM | POA: Diagnosis not present

## 2023-07-30 ENCOUNTER — Emergency Department (HOSPITAL_COMMUNITY)

## 2023-07-30 ENCOUNTER — Other Ambulatory Visit: Payer: Self-pay

## 2023-07-30 ENCOUNTER — Inpatient Hospital Stay (HOSPITAL_COMMUNITY)
Admission: EM | Admit: 2023-07-30 | Discharge: 2023-08-07 | DRG: 521 | Disposition: A | Discharge status: Skilled Nursing Facility | Attending: Family Medicine | Admitting: Family Medicine

## 2023-07-30 ENCOUNTER — Encounter (HOSPITAL_COMMUNITY): Payer: Self-pay

## 2023-07-30 DIAGNOSIS — D6869 Other thrombophilia: Secondary | ICD-10-CM | POA: Diagnosis present

## 2023-07-30 DIAGNOSIS — M1612 Unilateral primary osteoarthritis, left hip: Secondary | ICD-10-CM | POA: Diagnosis not present

## 2023-07-30 DIAGNOSIS — W19XXXA Unspecified fall, initial encounter: Secondary | ICD-10-CM | POA: Diagnosis not present

## 2023-07-30 DIAGNOSIS — S72002A Fracture of unspecified part of neck of left femur, initial encounter for closed fracture: Principal | ICD-10-CM | POA: Diagnosis present

## 2023-07-30 DIAGNOSIS — Z471 Aftercare following joint replacement surgery: Secondary | ICD-10-CM | POA: Diagnosis not present

## 2023-07-30 DIAGNOSIS — I4811 Longstanding persistent atrial fibrillation: Secondary | ICD-10-CM | POA: Diagnosis present

## 2023-07-30 DIAGNOSIS — I4819 Other persistent atrial fibrillation: Secondary | ICD-10-CM | POA: Diagnosis not present

## 2023-07-30 DIAGNOSIS — Z79899 Other long term (current) drug therapy: Secondary | ICD-10-CM

## 2023-07-30 DIAGNOSIS — W1839XA Other fall on same level, initial encounter: Secondary | ICD-10-CM | POA: Diagnosis present

## 2023-07-30 DIAGNOSIS — E064 Drug-induced thyroiditis: Secondary | ICD-10-CM | POA: Diagnosis not present

## 2023-07-30 DIAGNOSIS — Z87891 Personal history of nicotine dependence: Secondary | ICD-10-CM | POA: Diagnosis not present

## 2023-07-30 DIAGNOSIS — Z7901 Long term (current) use of anticoagulants: Secondary | ICD-10-CM

## 2023-07-30 DIAGNOSIS — G309 Alzheimer's disease, unspecified: Secondary | ICD-10-CM | POA: Diagnosis present

## 2023-07-30 DIAGNOSIS — H9190 Unspecified hearing loss, unspecified ear: Secondary | ICD-10-CM | POA: Diagnosis not present

## 2023-07-30 DIAGNOSIS — I10 Essential (primary) hypertension: Secondary | ICD-10-CM | POA: Diagnosis not present

## 2023-07-30 DIAGNOSIS — Z832 Family history of diseases of the blood and blood-forming organs and certain disorders involving the immune mechanism: Secondary | ICD-10-CM

## 2023-07-30 DIAGNOSIS — Z823 Family history of stroke: Secondary | ICD-10-CM

## 2023-07-30 DIAGNOSIS — Z7401 Bed confinement status: Secondary | ICD-10-CM | POA: Diagnosis not present

## 2023-07-30 DIAGNOSIS — Z96642 Presence of left artificial hip joint: Secondary | ICD-10-CM | POA: Diagnosis not present

## 2023-07-30 DIAGNOSIS — F32A Depression, unspecified: Secondary | ICD-10-CM | POA: Diagnosis not present

## 2023-07-30 DIAGNOSIS — Z043 Encounter for examination and observation following other accident: Secondary | ICD-10-CM | POA: Diagnosis not present

## 2023-07-30 DIAGNOSIS — S72042A Displaced fracture of base of neck of left femur, initial encounter for closed fracture: Secondary | ICD-10-CM | POA: Diagnosis not present

## 2023-07-30 DIAGNOSIS — K219 Gastro-esophageal reflux disease without esophagitis: Secondary | ICD-10-CM | POA: Diagnosis present

## 2023-07-30 DIAGNOSIS — S72012A Unspecified intracapsular fracture of left femur, initial encounter for closed fracture: Secondary | ICD-10-CM | POA: Diagnosis not present

## 2023-07-30 DIAGNOSIS — E43 Unspecified severe protein-calorie malnutrition: Secondary | ICD-10-CM | POA: Diagnosis not present

## 2023-07-30 DIAGNOSIS — F0390 Unspecified dementia without behavioral disturbance: Secondary | ICD-10-CM | POA: Diagnosis not present

## 2023-07-30 DIAGNOSIS — Z8249 Family history of ischemic heart disease and other diseases of the circulatory system: Secondary | ICD-10-CM | POA: Diagnosis not present

## 2023-07-30 DIAGNOSIS — Z681 Body mass index (BMI) 19 or less, adult: Secondary | ICD-10-CM

## 2023-07-30 DIAGNOSIS — R410 Disorientation, unspecified: Secondary | ICD-10-CM | POA: Diagnosis not present

## 2023-07-30 DIAGNOSIS — I493 Ventricular premature depolarization: Secondary | ICD-10-CM | POA: Diagnosis not present

## 2023-07-30 DIAGNOSIS — I480 Paroxysmal atrial fibrillation: Secondary | ICD-10-CM | POA: Diagnosis present

## 2023-07-30 DIAGNOSIS — R54 Age-related physical debility: Secondary | ICD-10-CM | POA: Diagnosis present

## 2023-07-30 DIAGNOSIS — M25552 Pain in left hip: Secondary | ICD-10-CM | POA: Diagnosis not present

## 2023-07-30 DIAGNOSIS — R531 Weakness: Secondary | ICD-10-CM | POA: Diagnosis not present

## 2023-07-30 DIAGNOSIS — S72002D Fracture of unspecified part of neck of left femur, subsequent encounter for closed fracture with routine healing: Secondary | ICD-10-CM | POA: Diagnosis not present

## 2023-07-30 DIAGNOSIS — Z825 Family history of asthma and other chronic lower respiratory diseases: Secondary | ICD-10-CM | POA: Diagnosis not present

## 2023-07-30 DIAGNOSIS — F0280 Dementia in other diseases classified elsewhere without behavioral disturbance: Secondary | ICD-10-CM | POA: Diagnosis not present

## 2023-07-30 DIAGNOSIS — I4891 Unspecified atrial fibrillation: Secondary | ICD-10-CM | POA: Diagnosis not present

## 2023-07-30 DIAGNOSIS — R000 Tachycardia, unspecified: Secondary | ICD-10-CM | POA: Diagnosis not present

## 2023-07-30 DIAGNOSIS — J449 Chronic obstructive pulmonary disease, unspecified: Secondary | ICD-10-CM | POA: Diagnosis not present

## 2023-07-30 DIAGNOSIS — F05 Delirium due to known physiological condition: Secondary | ICD-10-CM | POA: Diagnosis not present

## 2023-07-30 DIAGNOSIS — F03918 Unspecified dementia, unspecified severity, with other behavioral disturbance: Secondary | ICD-10-CM | POA: Diagnosis not present

## 2023-07-30 DIAGNOSIS — Y92008 Other place in unspecified non-institutional (private) residence as the place of occurrence of the external cause: Secondary | ICD-10-CM

## 2023-07-30 DIAGNOSIS — J479 Bronchiectasis, uncomplicated: Secondary | ICD-10-CM | POA: Diagnosis not present

## 2023-07-30 DIAGNOSIS — K5900 Constipation, unspecified: Secondary | ICD-10-CM | POA: Diagnosis not present

## 2023-07-30 DIAGNOSIS — H9193 Unspecified hearing loss, bilateral: Secondary | ICD-10-CM | POA: Diagnosis present

## 2023-07-30 DIAGNOSIS — R0602 Shortness of breath: Secondary | ICD-10-CM | POA: Diagnosis not present

## 2023-07-30 DIAGNOSIS — S0990XA Unspecified injury of head, initial encounter: Secondary | ICD-10-CM | POA: Diagnosis not present

## 2023-07-30 DIAGNOSIS — R918 Other nonspecific abnormal finding of lung field: Secondary | ICD-10-CM | POA: Diagnosis not present

## 2023-07-30 DIAGNOSIS — Z880 Allergy status to penicillin: Secondary | ICD-10-CM

## 2023-07-30 DIAGNOSIS — Z66 Do not resuscitate: Secondary | ICD-10-CM | POA: Diagnosis present

## 2023-07-30 DIAGNOSIS — W19XXXD Unspecified fall, subsequent encounter: Secondary | ICD-10-CM | POA: Diagnosis not present

## 2023-07-30 DIAGNOSIS — Z888 Allergy status to other drugs, medicaments and biological substances status: Secondary | ICD-10-CM | POA: Diagnosis not present

## 2023-07-30 DIAGNOSIS — I959 Hypotension, unspecified: Secondary | ICD-10-CM | POA: Diagnosis not present

## 2023-07-30 DIAGNOSIS — Z881 Allergy status to other antibiotic agents status: Secondary | ICD-10-CM

## 2023-07-30 DIAGNOSIS — H9319 Tinnitus, unspecified ear: Secondary | ICD-10-CM | POA: Diagnosis not present

## 2023-07-30 DIAGNOSIS — R4182 Altered mental status, unspecified: Secondary | ICD-10-CM | POA: Diagnosis not present

## 2023-07-30 DIAGNOSIS — S199XXA Unspecified injury of neck, initial encounter: Secondary | ICD-10-CM | POA: Diagnosis not present

## 2023-07-30 HISTORY — DX: Unspecified atrial fibrillation: I48.91

## 2023-07-30 LAB — PROTIME-INR
INR: 1.2 (ref 0.8–1.2)
Prothrombin Time: 15.7 s — ABNORMAL HIGH (ref 11.4–15.2)

## 2023-07-30 LAB — COMPREHENSIVE METABOLIC PANEL
ALT: 14 U/L (ref 0–44)
AST: 20 U/L (ref 15–41)
Albumin: 4.1 g/dL (ref 3.5–5.0)
Alkaline Phosphatase: 85 U/L (ref 38–126)
Anion gap: 10 (ref 5–15)
BUN: 24 mg/dL — ABNORMAL HIGH (ref 8–23)
CO2: 27 mmol/L (ref 22–32)
Calcium: 9.2 mg/dL (ref 8.9–10.3)
Chloride: 99 mmol/L (ref 98–111)
Creatinine, Ser: 0.56 mg/dL (ref 0.44–1.00)
GFR, Estimated: >60 mL/min (ref >60)
Glucose, Bld: 108 mg/dL — ABNORMAL HIGH (ref 70–99)
Potassium: 3.8 mmol/L (ref 3.5–5.1)
Sodium: 136 mmol/L (ref 135–145)
Total Bilirubin: 0.9 mg/dL (ref 0.0–1.2)
Total Protein: 7.8 g/dL (ref 6.5–8.1)

## 2023-07-30 LAB — CBC
HCT: 44 % (ref 36.0–46.0)
Hemoglobin: 14 g/dL (ref 12.0–15.0)
MCH: 29.4 pg (ref 26.0–34.0)
MCHC: 31.8 g/dL (ref 30.0–36.0)
MCV: 92.2 fL (ref 80.0–100.0)
Platelets: 181 10*3/uL (ref 150–400)
RBC: 4.77 MIL/uL (ref 3.87–5.11)
RDW: 14.6 % (ref 11.5–15.5)
WBC: 6.2 10*3/uL (ref 4.0–10.5)
nRBC: 0 % (ref 0.0–0.2)

## 2023-07-30 NOTE — ED Triage Notes (Signed)
 Pt BIBA from home, c/o fall from standing, and left upper leg pain. Is on blood thinners. Denies LOC or head strike. Hx of Alzheimer's.

## 2023-07-31 DIAGNOSIS — F0390 Unspecified dementia without behavioral disturbance: Secondary | ICD-10-CM | POA: Diagnosis not present

## 2023-07-31 DIAGNOSIS — R531 Weakness: Secondary | ICD-10-CM | POA: Diagnosis not present

## 2023-07-31 DIAGNOSIS — I480 Paroxysmal atrial fibrillation: Secondary | ICD-10-CM | POA: Diagnosis not present

## 2023-07-31 DIAGNOSIS — Z832 Family history of diseases of the blood and blood-forming organs and certain disorders involving the immune mechanism: Secondary | ICD-10-CM | POA: Diagnosis not present

## 2023-07-31 DIAGNOSIS — Z79899 Other long term (current) drug therapy: Secondary | ICD-10-CM | POA: Diagnosis not present

## 2023-07-31 DIAGNOSIS — K5900 Constipation, unspecified: Secondary | ICD-10-CM | POA: Diagnosis not present

## 2023-07-31 DIAGNOSIS — Z825 Family history of asthma and other chronic lower respiratory diseases: Secondary | ICD-10-CM | POA: Diagnosis not present

## 2023-07-31 DIAGNOSIS — Z8249 Family history of ischemic heart disease and other diseases of the circulatory system: Secondary | ICD-10-CM | POA: Diagnosis not present

## 2023-07-31 DIAGNOSIS — H9190 Unspecified hearing loss, unspecified ear: Secondary | ICD-10-CM | POA: Diagnosis not present

## 2023-07-31 DIAGNOSIS — R0602 Shortness of breath: Secondary | ICD-10-CM | POA: Diagnosis not present

## 2023-07-31 DIAGNOSIS — Z880 Allergy status to penicillin: Secondary | ICD-10-CM | POA: Diagnosis not present

## 2023-07-31 DIAGNOSIS — Y92008 Other place in unspecified non-institutional (private) residence as the place of occurrence of the external cause: Secondary | ICD-10-CM | POA: Diagnosis not present

## 2023-07-31 DIAGNOSIS — R9431 Abnormal electrocardiogram [ECG] [EKG]: Secondary | ICD-10-CM | POA: Insufficient documentation

## 2023-07-31 DIAGNOSIS — F32A Depression, unspecified: Secondary | ICD-10-CM | POA: Diagnosis not present

## 2023-07-31 DIAGNOSIS — S72002D Fracture of unspecified part of neck of left femur, subsequent encounter for closed fracture with routine healing: Secondary | ICD-10-CM | POA: Diagnosis not present

## 2023-07-31 DIAGNOSIS — J479 Bronchiectasis, uncomplicated: Secondary | ICD-10-CM | POA: Diagnosis present

## 2023-07-31 DIAGNOSIS — F03918 Unspecified dementia, unspecified severity, with other behavioral disturbance: Secondary | ICD-10-CM | POA: Insufficient documentation

## 2023-07-31 DIAGNOSIS — S72042A Displaced fracture of base of neck of left femur, initial encounter for closed fracture: Secondary | ICD-10-CM | POA: Diagnosis not present

## 2023-07-31 DIAGNOSIS — J449 Chronic obstructive pulmonary disease, unspecified: Secondary | ICD-10-CM | POA: Diagnosis not present

## 2023-07-31 DIAGNOSIS — Z881 Allergy status to other antibiotic agents status: Secondary | ICD-10-CM | POA: Diagnosis not present

## 2023-07-31 DIAGNOSIS — Z7901 Long term (current) use of anticoagulants: Secondary | ICD-10-CM | POA: Diagnosis not present

## 2023-07-31 DIAGNOSIS — H9193 Unspecified hearing loss, bilateral: Secondary | ICD-10-CM | POA: Diagnosis present

## 2023-07-31 DIAGNOSIS — Z823 Family history of stroke: Secondary | ICD-10-CM | POA: Diagnosis not present

## 2023-07-31 DIAGNOSIS — Z96642 Presence of left artificial hip joint: Secondary | ICD-10-CM | POA: Diagnosis not present

## 2023-07-31 DIAGNOSIS — E43 Unspecified severe protein-calorie malnutrition: Secondary | ICD-10-CM | POA: Diagnosis present

## 2023-07-31 DIAGNOSIS — D6869 Other thrombophilia: Secondary | ICD-10-CM | POA: Diagnosis present

## 2023-07-31 DIAGNOSIS — Z7401 Bed confinement status: Secondary | ICD-10-CM | POA: Diagnosis not present

## 2023-07-31 DIAGNOSIS — S72002A Fracture of unspecified part of neck of left femur, initial encounter for closed fracture: Secondary | ICD-10-CM | POA: Diagnosis present

## 2023-07-31 DIAGNOSIS — F0280 Dementia in other diseases classified elsewhere without behavioral disturbance: Secondary | ICD-10-CM | POA: Diagnosis present

## 2023-07-31 DIAGNOSIS — M25552 Pain in left hip: Secondary | ICD-10-CM | POA: Diagnosis not present

## 2023-07-31 DIAGNOSIS — Z471 Aftercare following joint replacement surgery: Secondary | ICD-10-CM | POA: Diagnosis not present

## 2023-07-31 DIAGNOSIS — Z87891 Personal history of nicotine dependence: Secondary | ICD-10-CM | POA: Diagnosis not present

## 2023-07-31 DIAGNOSIS — I493 Ventricular premature depolarization: Secondary | ICD-10-CM | POA: Diagnosis not present

## 2023-07-31 DIAGNOSIS — S72012A Unspecified intracapsular fracture of left femur, initial encounter for closed fracture: Secondary | ICD-10-CM | POA: Diagnosis present

## 2023-07-31 DIAGNOSIS — R4182 Altered mental status, unspecified: Secondary | ICD-10-CM | POA: Diagnosis not present

## 2023-07-31 DIAGNOSIS — Z888 Allergy status to other drugs, medicaments and biological substances status: Secondary | ICD-10-CM | POA: Diagnosis not present

## 2023-07-31 DIAGNOSIS — W1839XA Other fall on same level, initial encounter: Secondary | ICD-10-CM | POA: Diagnosis present

## 2023-07-31 DIAGNOSIS — I4811 Longstanding persistent atrial fibrillation: Secondary | ICD-10-CM | POA: Diagnosis present

## 2023-07-31 DIAGNOSIS — Z66 Do not resuscitate: Secondary | ICD-10-CM | POA: Diagnosis present

## 2023-07-31 DIAGNOSIS — I10 Essential (primary) hypertension: Secondary | ICD-10-CM | POA: Diagnosis not present

## 2023-07-31 DIAGNOSIS — R54 Age-related physical debility: Secondary | ICD-10-CM | POA: Diagnosis present

## 2023-07-31 DIAGNOSIS — E064 Drug-induced thyroiditis: Secondary | ICD-10-CM | POA: Diagnosis not present

## 2023-07-31 DIAGNOSIS — W19XXXA Unspecified fall, initial encounter: Secondary | ICD-10-CM | POA: Diagnosis not present

## 2023-07-31 DIAGNOSIS — K219 Gastro-esophageal reflux disease without esophagitis: Secondary | ICD-10-CM | POA: Diagnosis present

## 2023-07-31 DIAGNOSIS — I4819 Other persistent atrial fibrillation: Secondary | ICD-10-CM | POA: Diagnosis not present

## 2023-07-31 DIAGNOSIS — F05 Delirium due to known physiological condition: Secondary | ICD-10-CM | POA: Diagnosis present

## 2023-07-31 DIAGNOSIS — W19XXXD Unspecified fall, subsequent encounter: Secondary | ICD-10-CM | POA: Diagnosis not present

## 2023-07-31 DIAGNOSIS — G309 Alzheimer's disease, unspecified: Secondary | ICD-10-CM | POA: Diagnosis present

## 2023-07-31 DIAGNOSIS — I4891 Unspecified atrial fibrillation: Secondary | ICD-10-CM | POA: Diagnosis not present

## 2023-07-31 DIAGNOSIS — Z681 Body mass index (BMI) 19 or less, adult: Secondary | ICD-10-CM | POA: Diagnosis not present

## 2023-07-31 DIAGNOSIS — R000 Tachycardia, unspecified: Secondary | ICD-10-CM | POA: Diagnosis not present

## 2023-07-31 DIAGNOSIS — H9319 Tinnitus, unspecified ear: Secondary | ICD-10-CM | POA: Diagnosis not present

## 2023-07-31 LAB — MAGNESIUM: Magnesium: 1.9 mg/dL (ref 1.7–2.4)

## 2023-07-31 MED ORDER — ONDANSETRON HCL 4 MG/2ML IJ SOLN: 4.0000 mg | Freq: Once | INTRAMUSCULAR | Status: DC

## 2023-07-31 MED ORDER — LEVALBUTEROL HCL 0.63 MG/3ML IN NEBU
0.6300 mg | INHALATION_SOLUTION | Freq: Four times a day (QID) | RESPIRATORY_TRACT | Status: DC | PRN
  Administered 2023-08-02 – 2023-08-03 (×2): 0.63 mg via RESPIRATORY_TRACT
  Filled 2023-07-31 (×3): qty 3

## 2023-07-31 MED ORDER — FENTANYL CITRATE PF 50 MCG/ML IJ SOSY: 12.5000 ug | PREFILLED_SYRINGE | INTRAMUSCULAR | Status: DC | PRN

## 2023-07-31 MED ORDER — ACETAMINOPHEN 325 MG PO TABS
650.0000 mg | ORAL_TABLET | Freq: Four times a day (QID) | ORAL | Status: DC | PRN
  Administered 2023-07-31 (×2): 650 mg via ORAL
  Filled 2023-07-31 (×2): qty 2

## 2023-07-31 MED ORDER — FENTANYL CITRATE PF 50 MCG/ML IJ SOSY
50.0000 ug | PREFILLED_SYRINGE | INTRAMUSCULAR | Status: DC | PRN
  Administered 2023-07-31: 50 ug via INTRAVENOUS
  Filled 2023-07-31: qty 1

## 2023-07-31 MED ORDER — SODIUM CHLORIDE 0.9 % IV SOLN: INTRAVENOUS | Status: DC

## 2023-07-31 MED ORDER — OXYCODONE HCL 5 MG PO TABS
5.0000 mg | ORAL_TABLET | Freq: Four times a day (QID) | ORAL | Status: DC | PRN
  Administered 2023-07-31 – 2023-08-03 (×3): 5 mg via ORAL
  Filled 2023-07-31 (×3): qty 1

## 2023-07-31 MED ORDER — SODIUM CHLORIDE 0.9 % IV BOLUS
500.0000 mL | Freq: Once | INTRAVENOUS | Status: AC
  Administered 2023-07-31: 500 mL via INTRAVENOUS

## 2023-07-31 MED ORDER — SERTRALINE HCL 50 MG PO TABS
50.0000 mg | ORAL_TABLET | Freq: Every day | ORAL | Status: DC
  Administered 2023-07-31 – 2023-08-07 (×7): 50 mg via ORAL
  Filled 2023-07-31 (×7): qty 1

## 2023-07-31 MED ORDER — NALOXONE HCL 0.4 MG/ML IJ SOLN: 0.4000 mg | INTRAMUSCULAR | Status: DC | PRN

## 2023-07-31 MED ORDER — MORPHINE SULFATE (PF) 4 MG/ML IV SOLN: 4.0000 mg | Freq: Once | INTRAVENOUS | Status: DC

## 2023-07-31 MED ORDER — DILTIAZEM HCL ER COATED BEADS 120 MG PO CP24
120.0000 mg | ORAL_CAPSULE | Freq: Every day | ORAL | Status: DC
  Administered 2023-07-31 – 2023-08-02 (×3): 120 mg via ORAL
  Filled 2023-07-31 (×3): qty 1

## 2023-07-31 MED ORDER — DILTIAZEM HCL ER COATED BEADS 120 MG PO CP24
120.0000 mg | ORAL_CAPSULE | Freq: Once | ORAL | Status: AC
  Administered 2023-07-31: 120 mg via ORAL
  Filled 2023-07-31: qty 1

## 2023-07-31 MED ORDER — POTASSIUM CHLORIDE 10 MEQ/100ML IV SOLN
10.0000 meq | INTRAVENOUS | Status: AC
  Administered 2023-07-31 (×2): 10 meq via INTRAVENOUS
  Filled 2023-07-31 (×2): qty 100

## 2023-07-31 NOTE — ED Provider Notes (Signed)
 [facility] AT [facility] Provider Note   CSN: [id number] Arrival date & time: 07/30/23  2149     History  Chief Complaint  Patient presents with   [name]    [name] is a 89 y.o. female.  HPI    89 year old female comes in with chief complaint of mechanical fall. Patient comes from home.  Patient has history of dementia, A-fib for which she is on anticoagulation.  She had a fall from standing height, there was weakness.  Patient has no complaints from her side.   Home Medications Prior to Admission medications   Medication Sig Start Date End Date Taking? Authorizing Provider  apixaban (ELIQUIS) 2.5 MG TABS tablet TAKE 1 TABLET(2.5 MG) BY MOUTH TWICE DAILY 06/01/22   Bethany Pigeon, PA-C  Cholecalciferol (VITAMIN D3) 2000 units TABS Take 1,000 Units by mouth daily. Gummy    [provider]  diltiazem (CARDIZEM CD) 120 MG 24 hr capsule Take 1 capsule (120 mg total) by mouth daily. 04/23/23 07/22/23  Bethany Pen, PA-C  ipratropium-albuterol (DUONEB) 0.5-2.5 (3) MG/3ML SOLN Take 3 mLs by nebulization every 6 (six) hours as needed. 05/18/23   [name], Bethany Cola, NP  sertraline (ZOLOFT) 50 MG tablet Take 50 mg by mouth daily.    [provider]      Allergies    Augmentin [amoxicillin-pot clavulanate], Biaxin [clarithromycin], Metoprolol, and Tequin [gatifloxacin]    Review of Systems   Review of Systems  Physical Exam Updated Vital Signs BP (!) 147/94 (BP Location: Left Arm)   Pulse (!) 118   Temp 97.8 F (36.6 C) (Oral)   Resp 16   SpO2 96%  Physical Exam Vitals and nursing note reviewed.  Constitutional:      Appearance: She is well-developed.  HENT:     Head: Normocephalic and atraumatic.  Eyes:     Extraocular Movements: Extraocular movements intact.     Pupils: Pupils are equal, round, and reactive to light.  Neck:     Comments: No midline c-spine tenderness Cardiovascular:     Rate and Rhythm:  Normal rate and regular rhythm.  Pulmonary:     Effort: Pulmonary effort is normal. No respiratory distress.     Breath sounds: Normal breath sounds.  Chest:     Chest wall: No tenderness.  Abdominal:     General: Bowel sounds are normal. There is no distension.     Palpations: Abdomen is soft.     Tenderness: There is no abdominal tenderness.  Musculoskeletal:        General: Swelling and tenderness present.     Cervical back: Neck supple.     Comments: Patient's left lower extremity is shortened and she has tenderness over the proximal femur  Skin:    General: Skin is warm and dry.     Findings: No rash.  Neurological:     Mental Status: She is alert and oriented to person, place, and time.     Cranial Nerves: No cranial nerve deficit.     ED Results / Procedures / Treatments   Labs (all labs ordered are listed, but only abnormal results are displayed) Labs Reviewed  COMPREHENSIVE METABOLIC PANEL - Abnormal; Notable for the following components:      Result Value   Glucose, Bld 108 (*)    BUN 24 (*)    All other components within normal limits  PROTIME-INR - Abnormal; Notable for the following components:   Prothrombin Time 15.7 (*)  All other components within normal limits  CBC  URINALYSIS, ROUTINE W REFLEX MICROSCOPIC  SAMPLE TO BLOOD BANK    EKG EKG Interpretation Date/Time:  Monday July 30 2023 22:41:35 EST Ventricular Rate:  121 PR Interval:    QRS Duration:  110 QT Interval:  354 QTC Calculation: 511 R Axis:   61  Text Interpretation: Atrial fibrillation Anterior infarct, old ST depression, probably rate related Prolonged QT interval No acute changes Confirmed by Derwood Kaplan [phone]) on 07/30/2023 10:57:47 PM  Radiology No results found.  Procedures Procedures    Medications Ordered in ED Medications - No data to display  ED Course/ Medical Decision Making/ A&P                                 Medical Decision Making Amount and/or  Complexity of Data Reviewed Labs: ordered. Radiology: ordered.  Risk Decision regarding hospitalization.   89 year old patient comes in after sustaining what appears to be a mechanical fall. Pertinent past medical includes A-fib on Eliquis, last dose was 9 AM. Collateral history provided by patient's daughter.  Based on my history and exam, differential diagnosis includes: - Traumatic brain injury including intracranial hemorrhage - Long bone fractures - Contusions - Soft tissue injury - Concussion  Based on the initial assessment, the following workup was initiated : CT scan of the brain, x-ray of the hip.  I have independently interpreted the following imaging from the perspective of acute trauma: X-ray of the hip and the results indicate right femoral neck fracture.  I spoke with Dr. Odis Hollingshead, orthopedic on-call.  He informed me that patient is established with Dr. Susa Simmonds, and therefore to call them.     Final Clinical Impression(s) / ED Diagnoses Final diagnoses:  Closed fracture of neck of left femur, initial encounter [facility])    Rx / DC Orders ED Discharge Orders     None         Derwood Kaplan, MD 07/31/23 [phone]

## 2023-07-31 NOTE — H&P (Signed)
 History and Physical    [name] MWU:[id number] DOB: Nov 16, 1930 DOA: 07/30/2023  PCP: Thana Ates, MD  Patient coming from: Home  Chief Complaint: Fall, left hip pain  HPI: [name] is a 89 y.o. female with medical history significant of dementia, persistent A-fib on Eliquis, bronchiectasis, GERD, hearing loss, PVCs presented to the ED complaining of left hip pain after a mechanical fall.  Vital signs on arrival: Temperature 97.8 F, pulse 118, respiratory rate 16, blood pressure 147/94, and SpO2 96% on room air.  Hip x-ray showing acute fracture of the proximal left femur.  No acute findings identified on chest x-ray and CT of head and C-spine.  No significant lab abnormalities on CBC and CMP. Patient was given fentanyl for pain and a dose of her home oral Cardizem in the ED.  ED physician discussed the case with Dr. Susa Simmonds with [facility] who will see the patient in consultation in the morning.  [facility] called to admit.  Patient has dementia/oriented to self only and is not able to give any history.  Also hard of hearing.  She does not know how she fell and has no complaints.  Resting comfortably and denies pain anywhere.  No family at bedside.  I called and spoke to her daughter/HCPOA [name] who informed me that patient has Alzheimer's dementia and lives with her but in a separate apartment.  Daughter was with the patient and had to leave and when she came back an hour later to check on her which was around 9 PM, she found the patient on the floor and she was not able to get up so EMS was called.  Daughter states otherwise the patient has been doing well and has not had any fevers, cough, shortness of breath, vomiting, diarrhea, or urinary symptoms.  Patient has not complained of any chest or abdominal pain.  She is on Eliquis for A-fib and last dose was yesterday at 10 AM.  Discussed CODE STATUS and daughter confirms that the patient is DNR (no CPR or chest compressions)  but pre-arrest interventions desired.  Review of Systems:  Review of Systems  All other systems reviewed and are negative.   Past Medical History:  Diagnosis Date   Bronchiectasis (HCC)    with multiple pneumonia back in 1990's   Bronchitis    episodic   DJD (degenerative joint disease) of knee    GERD (gastroesophageal reflux disease)    episodic symptoms   Hearing loss    chronic moderate hearing loss, worked up by ENT and hearing aids have been recommended   History of nuclear stress test 11/07   NORMAL   Insomnia    PVC's (premature ventricular contractions)    on EKG, controlled on atenolol and caffeine reduction   Tinnitus    chronic    Past Surgical History:  Procedure Laterality Date   BREAST EXCISIONAL BIOPSY Left    CARDIOVERSION N/A 08/31/2015   Procedure: CARDIOVERSION;  Surgeon: Jake Bathe, MD;  Location[facility] ENDOSCOPY;  Service: Cardiovascular;  Laterality: N/A;   CARDIOVERSION N/A 02/28/2023   Procedure: CARDIOVERSION;  Surgeon: [name], MD;  Location: [facility] INVASIVE CV LAB;  Service: Cardiovascular;  Laterality: N/A;   cyst removed     left breast     reports that she quit smoking about 55 years ago. Her smoking use included cigarettes. She started smoking about 75 years ago. She has a 10 pack-year smoking history. She has never used smokeless  tobacco. She reports current alcohol use. She reports that she does not use drugs.  Allergies  Allergen Reactions   Augmentin [Amoxicillin-Pot Clavulanate] Nausea And Vomiting   Biaxin [Clarithromycin] Nausea And Vomiting   Metoprolol Other (See Comments)    Patient notes she "feels funny" after taking it   Tequin [Gatifloxacin] Nausea Only    Family History  Problem Relation Age of Onset   Stroke Father    Hypertension Father    Stroke Mother    Hypertension Mother    Asthma Sister    Clotting disorder Sister    Heart attack Neg Hx     Prior to Admission medications   Medication Sig Start Date  End Date Taking? Authorizing Provider  apixaban (ELIQUIS) 2.5 MG TABS tablet TAKE 1 TABLET(2.5 MG) BY MOUTH TWICE DAILY 06/01/22   Sheilah Pigeon, PA-C  Cholecalciferol (VITAMIN D3) 2000 units TABS Take 1,000 Units by mouth daily. Gummy    [provider]  diltiazem (CARDIZEM CD) 120 MG 24 hr capsule Take 1 capsule (120 mg total) by mouth daily. 04/23/23 07/22/23  Eustace Pen, PA-C  ipratropium-albuterol (DUONEB) 0.5-2.5 (3) MG/3ML SOLN Take 3 mLs by nebulization every 6 (six) hours as needed. 05/18/23   Cobb, Ruby Cola, NP  sertraline (ZOLOFT) 50 MG tablet Take 50 mg by mouth daily.    [provider]    Physical Exam: Vitals:   07/31/23 0045 07/31/23 0145 07/31/23 0249 07/31/23 0400  BP: 131/85 123/85 109/68 105/68  Pulse: (!) 136 (!) 119 (!) 128 (!) 123  Resp: (!) 26 (!) 28 (!) 26 (!) 27  Temp:   98.1 F (36.7 C)   TempSrc:   Oral   SpO2: 94% 93% 96% 95%    Physical Exam Vitals reviewed.  Constitutional:      General: She is not in acute distress. HENT:     Head: Normocephalic and atraumatic.  Eyes:     Extraocular Movements: Extraocular movements intact.  Cardiovascular:     Rate and Rhythm: Tachycardia present. Rhythm irregular.     Pulses: Normal pulses.  Pulmonary:     Effort: Pulmonary effort is normal. No respiratory distress.     Breath sounds: Normal breath sounds. No wheezing, rhonchi or rales.  Abdominal:     General: Bowel sounds are normal. There is no distension.     Palpations: Abdomen is soft.     Tenderness: There is no abdominal tenderness. There is no guarding.  Musculoskeletal:     Cervical back: Normal range of motion.     Right lower leg: No edema.     Left lower leg: No edema.     Comments: Left lower extremity shortened and externally rotated.  Neurovascularly intact.  Skin:    General: Skin is warm and dry.  Neurological:     General: No focal deficit present.     Mental Status: She is alert.     Labs on Admission:  I have personally reviewed following labs and imaging studies  CBC: Recent Labs  Lab 07/30/23 2256  WBC 6.2  HGB 14.0  HCT 44.0  MCV 92.2  PLT 181   Basic Metabolic Panel: Recent Labs  Lab 07/30/23 2256  NA 136  K 3.8  CL 99  CO2 27  GLUCOSE 108*  BUN 24*  CREATININE 0.56  CALCIUM 9.2   GFR: CrCl cannot be calculated (Unknown ideal weight.). Liver Function Tests: Recent Labs  Lab 07/30/23 2256  AST 20  ALT 14  ALKPHOS 85  BILITOT 0.9  PROT 7.8  ALBUMIN 4.1   No results for input(s): "LIPASE", "AMYLASE" in the last 168 hours. No results for input(s): "AMMONIA" in the last 168 hours. Coagulation Profile: Recent Labs  Lab 07/30/23 2256  INR 1.2   Cardiac Enzymes: No results for input(s): "CKTOTAL", "CKMB", "CKMBINDEX", "TROPONINI" in the last 168 hours. BNP (last 3 results) No results for input(s): "PROBNP" in the last 8760 hours. HbA1C: No results for input(s): "HGBA1C" in the last 72 hours. CBG: No results for input(s): "GLUCAP" in the last 168 hours. Lipid Profile: No results for input(s): "CHOL", "HDL", "LDLCALC", "TRIG", "CHOLHDL", "LDLDIRECT" in the last 72 hours. Thyroid Function Tests: No results for input(s): "TSH", "T4TOTAL", "FREET4", "T3FREE", "THYROIDAB" in the last 72 hours. Anemia Panel: No results for input(s): "VITAMINB12", "FOLATE", "FERRITIN", "TIBC", "IRON", "RETICCTPCT" in the last 72 hours. Urine analysis:    Component Value Date/Time   COLORURINE YELLOW 06/26/2022 2032   APPEARANCEUR CLEAR 06/26/2022 2032   APPEARANCEUR Clear 03/28/2017 1232   LABSPEC 1.021 06/26/2022 2032   PHURINE 6.5 06/26/2022 2032   GLUCOSEU NEGATIVE 06/26/2022 2032   HGBUR NEGATIVE 06/26/2022 2032   BILIRUBINUR NEGATIVE 06/26/2022 2032   BILIRUBINUR Negative 03/28/2017 1232   KETONESUR NEGATIVE 06/26/2022 2032   PROTEINUR TRACE (A) 06/26/2022 2032   NITRITE NEGATIVE 06/26/2022 2032   LEUKOCYTESUR MODERATE (A) 06/26/2022 2032    Radiological Exams  on Admission: CT Head Wo Contrast Result Date: 07/31/2023 CLINICAL DATA:  Head trauma, minor (Age >= 65y); Neck trauma (Age >= 65y) EXAM: CT HEAD WITHOUT CONTRAST CT CERVICAL SPINE WITHOUT CONTRAST TECHNIQUE: Multidetector CT imaging of the head and cervical spine was performed following the standard protocol without intravenous contrast. Multiplanar CT image reconstructions of the cervical spine were also generated. RADIATION DOSE REDUCTION: This exam was performed according to the departmental dose-optimization program which includes automated exposure control, adjustment of the mA and/or kV according to patient size and/or use of iterative reconstruction technique. COMPARISON:  CT head 06/26/2022. FINDINGS: CT HEAD FINDINGS Brain: No evidence of acute infarction, hemorrhage, hydrocephalus, extra-axial collection or mass lesion/mass effect. Vascular: Calcific atherosclerosis Skull: No acute fracture. Sinuses/Orbits: Clear sinuses.  No acute orbital findings. Other: No mastoid effusions. CT CERVICAL SPINE FINDINGS Alignment: Mild anterolisthesis of C4 on C5, likely degenerative. Skull base and vertebrae: No acute fracture. Soft tissues and spinal canal: No prevertebral fluid or swelling. No visible canal hematoma. Disc levels:  Moderate multilevel degenerative change. Upper chest: Visualized lung apices are clear. Biapical pleuroparenchymal scarring. IMPRESSION: No acute intracranial or cervical spine finding. Electronically Signed   By: Feliberto Harts M.D.   On: 07/31/2023 01:40   CT Cervical Spine Wo Contrast Result Date: 07/31/2023 CLINICAL DATA:  Head trauma, minor (Age >= 65y); Neck trauma (Age >= 65y) EXAM: CT HEAD WITHOUT CONTRAST CT CERVICAL SPINE WITHOUT CONTRAST TECHNIQUE: Multidetector CT imaging of the head and cervical spine was performed following the standard protocol without intravenous contrast. Multiplanar CT image reconstructions of the cervical spine were also generated. RADIATION DOSE  REDUCTION: This exam was performed according to the departmental dose-optimization program which includes automated exposure control, adjustment of the mA and/or kV according to patient size and/or use of iterative reconstruction technique. COMPARISON:  CT head 06/26/2022. FINDINGS: CT HEAD FINDINGS Brain: No evidence of acute infarction, hemorrhage, hydrocephalus, extra-axial collection or mass lesion/mass effect. Vascular: Calcific atherosclerosis Skull: No acute fracture. Sinuses/Orbits: Clear sinuses.  No acute orbital findings. Other: No mastoid effusions. CT CERVICAL SPINE FINDINGS  Alignment: Mild anterolisthesis of C4 on C5, likely degenerative. Skull base and vertebrae: No acute fracture. Soft tissues and spinal canal: No prevertebral fluid or swelling. No visible canal hematoma. Disc levels:  Moderate multilevel degenerative change. Upper chest: Visualized lung apices are clear. Biapical pleuroparenchymal scarring. IMPRESSION: No acute intracranial or cervical spine finding. Electronically Signed   By: Feliberto Harts M.D.   On: 07/31/2023 01:40   DG Chest Port 1 View Result Date: 07/31/2023 CLINICAL DATA:  Status post fall. EXAM: PORTABLE CHEST 1 VIEW COMPARISON:  None Available. FINDINGS: The heart size and mediastinal contours are within normal limits. Stable chronic appearing increased interstitial lung markings are seen. No acute infiltrate, pleural effusion or pneumothorax is identified. There is no evidence of acute osseous abnormality. IMPRESSION: Chronic appearing interstitial lung markings without acute or active cardiopulmonary disease. Electronically Signed   By: Aram Candela M.D.   On: 07/31/2023 01:20   DG Hip Unilat W or Wo Pelvis 2-3 Views Left Result Date: 07/31/2023 CLINICAL DATA:  Status post fall with subsequent left hip pain. EXAM: DG HIP (WITH OR WITHOUT PELVIS) 2-3V LEFT COMPARISON:  None Available. FINDINGS: There is an acute, impacted fracture deformity is seen involving  the head and neck of the proximal left femur. There is no evidence of dislocation. Degenerative changes are seen in the form of joint space narrowing and acetabular sclerosis. IMPRESSION: Acute fracture of the proximal left femur. Electronically Signed   By: Aram Candela M.D.   On: 07/31/2023 01:18    EKG: Independently reviewed.  A-fib with RVR, QTc 511, baseline wander in V4 and V5.  Assessment and Plan  Acute fracture of the proximal left femur Orthopedics consulted.  Keep n.p.o., IV fluid hydration, nonweightbearing, and continue pain management.  Hold home Eliquis, last dose was yesterday 3/3 at 10 AM.  QT prolongation Keep K >4 and Mag >2.  Avoid QT prolonging drugs and follow-up repeat EKG in the morning.  Persistent A-fib with RVR Hold Eliquis at this time given hip fracture and need for surgery.  Patient was given a dose of her home oral Cardizem in the ED.  Rate currently 100-110s.  Blood pressure soft and 500 mL IV fluid bolus ordered.  Continue gentle IV fluid hydration, pain management, and monitor closely.  Previous echo with normal EF.  Bronchiectasis Stable.  Xopenex as needed.  DVT prophylaxis: SCDs Code Status: Discussed CODE STATUS with the patient's daughter and she confirms that the patient is DNR (no CPR or chest compressions) but pre-arrest interventions desired. Family Communication: Daughter updated. Consults called: [facility] Level of care: Telemetry bed Admission status: It is my clinical opinion that admission to INPATIENT is reasonable and necessary because of the expectation that this patient will require hospital care that crosses at least 2 midnights to treat this condition based on the medical complexity of the problems presented.  Given the aforementioned information, the predictability of an adverse outcome is felt to be significant.  John Giovanni MD [facility]  If 7PM-7AM, please contact  night-coverage [email or web address]  07/31/2023, 5:17 AM

## 2023-07-31 NOTE — ED Provider Notes (Signed)
  Physical Exam  BP 105/68   Pulse (!) 123   Temp 98.1 F (36.7 C) (Oral)   Resp (!) 27   SpO2 95%   Physical Exam Vitals and nursing note reviewed.  Constitutional:      General: She is not in acute distress.    Appearance: She is well-developed.  HENT:     Head: Normocephalic and atraumatic.  Eyes:     Conjunctiva/sclera: Conjunctivae normal.  Cardiovascular:     Rate and Rhythm: Normal rate and regular rhythm.     Heart sounds: No murmur heard. Pulmonary:     Effort: Pulmonary effort is normal. No respiratory distress.     Breath sounds: Normal breath sounds.  Abdominal:     Palpations: Abdomen is soft.     Tenderness: There is no abdominal tenderness.  Musculoskeletal:        General: Tenderness present. No swelling.     Cervical back: Neck supple.  Skin:    General: Skin is warm and dry.     Capillary Refill: Capillary refill takes less than 2 seconds.  Neurological:     Mental Status: She is alert.  Psychiatric:        Mood and Affect: Mood normal.     Procedures  Procedures  ED Course / MDM    Medical Decision Making Amount and/or Complexity of Data Reviewed Labs: ordered. Radiology: ordered.  Risk Prescription drug management. Decision regarding hospitalization.   Patient received in handoff.  Fall with right-sided hip fracture.  Pending orthopedic consultation and hospital admission.  Spoke with Dr. Susa Simmonds who will evaluate the patient while hospitalized, likely surgery.  Patient admitted to medicine       Glendora Score, MD 07/31/23 [phone]

## 2023-07-31 NOTE — Progress Notes (Signed)
    Patient: [name] BJY:[id number] DOB: 04-01-1931      Brief hospital course: 89 y.o. F with dementia, pAF on Eliquis, lives with family, had an unwitnessed fall, and found to have left hip fracture.    This is a no charge note, for further details, please see the H&P by my partner, Dr. Loney Loh from earlier today.   Principal Problem:   Left hip fracture (HCC) Active Problems:   Paroxysmal atrial fibrillation (HCC)   Dementia    Last dose Eliquis yesterday morning 3/3 in AM.    To OR with Orthopedics per their protocol.   Continue home diltiazem, resume Eliquis post op when cleared by Orthopedics        Physical Exam: BP 101/67   Pulse (!) 112   Temp 98.2 F (36.8 C)   Resp (!) 28   SpO2 95%   Patient seen and examined.     Family Communication: Daughter        Author: Alberteen Sam, MD 07/31/2023 8:27 AM

## 2023-07-31 NOTE — Hospital Course (Addendum)
 89 y.o. F with dementia, pAF on Eliquis, lives with family, fell at home, had immediate pain and could not bear weight, and found to have left hip fracture.

## 2023-07-31 NOTE — Consult Note (Signed)
 Reason for Consult: Left hip fracture Referring Physician: Wonda Olds EDP   [name] is an 89 y.o. female.  HPI: [name] had a unwitnessed fall at home yesterday.  Her daughter states she found her laying on the floor seemingly complaining of left hip pain and unable to get up.  She was brought to the emergency department via EMS and found to have a left hip fracture.  Orthopedics was consulted for the fracture.  Presently, patient unable to produce history or verbalize pain. She appears comfortable. Family at bedside states she has dementia and this is her baseline. Family states she is ambulatory at baseline but did have some changes in gait that preceded the fall.  She stays at home with her family.  She does take Eliquis chronically for atrial fibrillation which is being held.  Family has not noticed any other extremity concerns or injuries or apparent complaints of pain by the patient.   Past Medical History:  Diagnosis Date   Bronchiectasis (HCC)    with multiple pneumonia back in 1990's   Bronchitis    episodic   DJD (degenerative joint disease) of knee    GERD (gastroesophageal reflux disease)    episodic symptoms   Hearing loss    chronic moderate hearing loss, worked up by ENT and hearing aids have been recommended   History of nuclear stress test 11/07   NORMAL   Insomnia    PVC's (premature ventricular contractions)    on EKG, controlled on atenolol and caffeine reduction   Tinnitus    chronic    Past Surgical History:  Procedure Laterality Date   BREAST EXCISIONAL BIOPSY Left    CARDIOVERSION N/A 08/31/2015   Procedure: CARDIOVERSION;  Surgeon: Jake Bathe, MD;  Location: [facility] ENDOSCOPY;  Service: Cardiovascular;  Laterality: N/A;   CARDIOVERSION N/A 02/28/2023   Procedure: CARDIOVERSION;  Surgeon: [name], MD;  Location: [facility] INVASIVE CV LAB;  Service: Cardiovascular;  Laterality: N/A;   cyst removed     left breast    Family History  Problem Relation  Age of Onset   Stroke Father    Hypertension Father    Stroke Mother    Hypertension Mother    Asthma Sister    Clotting disorder Sister    Heart attack Neg Hx     Social History:  reports that she quit smoking about 55 years ago. Her smoking use included cigarettes. She started smoking about 75 years ago. She has a 10 pack-year smoking history. She has never used smokeless tobacco. She reports current alcohol use. She reports that she does not use drugs.  Allergies:  Allergies  Allergen Reactions   Augmentin [Amoxicillin-Pot Clavulanate] Nausea And Vomiting   Biaxin [Clarithromycin] Nausea And Vomiting   Metoprolol Other (See Comments)    Patient notes she "feels funny" after taking it   Tequin [Gatifloxacin] Nausea Only    Medications: I have reviewed the patient's current medications.  Results for orders placed or performed during the hospital encounter of 07/30/23 (from the past 48 hours)  Comprehensive metabolic panel     Status: Abnormal   Collection Time: 07/30/23 10:56 PM  Result Value Ref Range   Sodium 136 135 - 145 mmol/L   Potassium 3.8 3.5 - 5.1 mmol/L   Chloride 99 98 - 111 mmol/L   CO2 27 22 - 32 mmol/L   Glucose, Bld 108 (H) 70 - 99 mg/dL    Comment: Glucose reference range applies only to samples  taken after fasting for at least 8 hours.   BUN 24 (H) 8 - 23 mg/dL   Creatinine, Ser 4.09 0.44 - 1.00 mg/dL   Calcium 9.2 8.9 - 81.1 mg/dL   Total Protein 7.8 6.5 - 8.1 g/dL   Albumin 4.1 3.5 - 5.0 g/dL   AST 20 15 - 41 U/L   ALT 14 0 - 44 U/L   Alkaline Phosphatase 85 38 - 126 U/L   Total Bilirubin 0.9 0.0 - 1.2 mg/dL   GFR, Estimated >91 >47 mL/min    Comment: (NOTE) Calculated using the CKD-EPI Creatinine Equation (2021)    Anion gap 10 5 - 15    Comment: Performed at [facility], [address] [place], [place] [address]  CBC     Status: None   Collection Time: 07/30/23 10:56 PM  Result Value Ref Range   WBC 6.2 4.0 - 10.5 K/uL    RBC 4.77 3.87 - 5.11 MIL/uL   Hemoglobin 14.0 12.0 - 15.0 g/dL   HCT 21.3 08.6 - 57.8 %   MCV 92.2 80.0 - 100.0 fL   MCH 29.4 26.0 - 34.0 pg   MCHC 31.8 30.0 - 36.0 g/dL   RDW 46.9 62.9 - 52.8 %   Platelets 181 150 - 400 K/uL   nRBC 0.0 0.0 - 0.2 %    Comment: Performed at [facility], [address] [place], [place] [address]  Protime-INR     Status: Abnormal   Collection Time: 07/30/23 10:56 PM  Result Value Ref Range   Prothrombin Time 15.7 (H) 11.4 - 15.2 seconds   INR 1.2 0.8 - 1.2    Comment: (NOTE) INR goal varies based on device and disease states. Performed at [facility], [address] [place], [place] [address]   Sample to Blood Bank     Status: None   Collection Time: 07/30/23 10:56 PM  Result Value Ref Range   Blood Bank Specimen SAMPLE AVAILABLE FOR TESTING    Sample Expiration      08/02/2023,2359 Performed at [facility], [address], [place], [place] [address]   Magnesium     Status: None   Collection Time: 07/31/23  8:08 AM  Result Value Ref Range   Magnesium 1.9 1.7 - 2.4 mg/dL    Comment: Performed at [facility], [address], [place], [place] [address]    CT Head Wo Contrast Result Date: 07/31/2023 CLINICAL DATA:  Head trauma, minor (Age >= 65y); Neck trauma (Age >= 65y) EXAM: CT HEAD WITHOUT CONTRAST CT CERVICAL SPINE WITHOUT CONTRAST TECHNIQUE: Multidetector CT imaging of the head and cervical spine was performed following the standard protocol without intravenous contrast. Multiplanar CT image reconstructions of the cervical spine were also generated. RADIATION DOSE REDUCTION: This exam was performed according to the departmental dose-optimization program which includes automated exposure control, adjustment of the mA and/or kV according to patient size and/or use of iterative reconstruction technique. COMPARISON:  CT head 06/26/2022. FINDINGS: CT HEAD FINDINGS Brain: No  evidence of acute infarction, hemorrhage, hydrocephalus, extra-axial collection or mass lesion/mass effect. Vascular: Calcific atherosclerosis Skull: No acute fracture. Sinuses/Orbits: Clear sinuses.  No acute orbital findings. Other: No mastoid effusions. CT CERVICAL SPINE FINDINGS Alignment: Mild anterolisthesis of C4 on C5, likely degenerative. Skull base and vertebrae: No acute fracture. Soft tissues and spinal canal: No prevertebral fluid or swelling. No visible canal hematoma. Disc levels:  Moderate multilevel degenerative change. Upper chest:  Visualized lung apices are clear. Biapical pleuroparenchymal scarring. IMPRESSION: No acute intracranial or cervical spine finding. Electronically Signed   By: Feliberto Harts M.D.   On: 07/31/2023 01:40   CT Cervical Spine Wo Contrast Result Date: 07/31/2023 CLINICAL DATA:  Head trauma, minor (Age >= 65y); Neck trauma (Age >= 65y) EXAM: CT HEAD WITHOUT CONTRAST CT CERVICAL SPINE WITHOUT CONTRAST TECHNIQUE: Multidetector CT imaging of the head and cervical spine was performed following the standard protocol without intravenous contrast. Multiplanar CT image reconstructions of the cervical spine were also generated. RADIATION DOSE REDUCTION: This exam was performed according to the departmental dose-optimization program which includes automated exposure control, adjustment of the mA and/or kV according to patient size and/or use of iterative reconstruction technique. COMPARISON:  CT head 06/26/2022. FINDINGS: CT HEAD FINDINGS Brain: No evidence of acute infarction, hemorrhage, hydrocephalus, extra-axial collection or mass lesion/mass effect. Vascular: Calcific atherosclerosis Skull: No acute fracture. Sinuses/Orbits: Clear sinuses.  No acute orbital findings. Other: No mastoid effusions. CT CERVICAL SPINE FINDINGS Alignment: Mild anterolisthesis of C4 on C5, likely degenerative. Skull base and vertebrae: No acute fracture. Soft tissues and spinal canal: No  prevertebral fluid or swelling. No visible canal hematoma. Disc levels:  Moderate multilevel degenerative change. Upper chest: Visualized lung apices are clear. Biapical pleuroparenchymal scarring. IMPRESSION: No acute intracranial or cervical spine finding. Electronically Signed   By: Feliberto Harts M.D.   On: 07/31/2023 01:40   DG Chest Port 1 View Result Date: 07/31/2023 CLINICAL DATA:  Status post fall. EXAM: PORTABLE CHEST 1 VIEW COMPARISON:  None Available. FINDINGS: The heart size and mediastinal contours are within normal limits. Stable chronic appearing increased interstitial lung markings are seen. No acute infiltrate, pleural effusion or pneumothorax is identified. There is no evidence of acute osseous abnormality. IMPRESSION: Chronic appearing interstitial lung markings without acute or active cardiopulmonary disease. Electronically Signed   By: Aram Candela M.D.   On: 07/31/2023 01:20   DG Hip Unilat W or Wo Pelvis 2-3 Views Left Result Date: 07/31/2023 CLINICAL DATA:  Status post fall with subsequent left hip pain. EXAM: DG HIP (WITH OR WITHOUT PELVIS) 2-3V LEFT COMPARISON:  None Available. FINDINGS: There is an acute, impacted fracture deformity is seen involving the head and neck of the proximal left femur. There is no evidence of dislocation. Degenerative changes are seen in the form of joint space narrowing and acetabular sclerosis. IMPRESSION: Acute fracture of the proximal left femur. Electronically Signed   By: Aram Candela M.D.   On: 07/31/2023 01:18    Review of Systems  Unable to perform ROS: Dementia   Blood pressure 133/62, pulse (!) 129, temperature 98 F (36.7 C), resp. rate (!) 29, SpO2 95%. Physical Exam Constitutional:      General: She is not in acute distress.    Comments: Frail elderly female   HENT:     Head: Normocephalic and atraumatic.     Nose: Nose normal.     Mouth/Throat:     Mouth: Mucous membranes are moist.  Eyes:     General: No scleral  icterus.    Extraocular Movements: Extraocular movements intact.  Pulmonary:     Effort: Pulmonary effort is normal. No respiratory distress.  Abdominal:     General: Abdomen is flat.  Musculoskeletal:     Comments: Examination of the left hip shows tenderness to palpation about the anterior lateral aspect of the hip.  Overlying skin is benign.  There is minimal swelling.  There is no notable  ecchymosis or skin disruption.  The left lower extremity appears shortened.  She has pain with attempted passive internal/external rotation of the hip.  She is observed actively plantar flexing and dorsiflexing the bilateral ankles.  Feet are warm and well-perfused distally.  She tolerates passive motion of the bilateral upper extremities and right lower remedy without  apparent complaints of pain or limitation   Skin:    General: Skin is warm.  Neurological:     Mental Status: Mental status is at baseline.     Assessment/Plan: Left hip femoral neck fracture  Discussed findings with family at bedside.  Her fracture is indicated for surgery provided her baseline ambulatory status, for pain control, and early mobilization postoperatively.  Likely in the form of  total hip replacement versus hemiarthroplasty.  Patients family at bedside agreeable to proceeding with surgical management.  Will discuss timing of surgery and surgeon amongst orthopedic team.  Likely surgery tomorrow.  Please keep n.p.o. past midnight.  Okay for diet today.  Bed rest. Continue holding Eliquis.  She has been admitted to the hospitalist service.    Jonna Munro [place] 07/31/2023, 2:25 PM

## 2023-08-01 ENCOUNTER — Inpatient Hospital Stay (HOSPITAL_COMMUNITY)

## 2023-08-01 ENCOUNTER — Inpatient Hospital Stay (HOSPITAL_COMMUNITY): Admitting: Certified Registered Nurse Anesthetist

## 2023-08-01 ENCOUNTER — Encounter (HOSPITAL_COMMUNITY)
Admission: EM | Disposition: A | Discharge status: Skilled Nursing Facility | Payer: Self-pay | Source: Home / Self Care | Attending: Family Medicine

## 2023-08-01 ENCOUNTER — Encounter (HOSPITAL_COMMUNITY): Payer: Self-pay | Admitting: Family Medicine

## 2023-08-01 DIAGNOSIS — J449 Chronic obstructive pulmonary disease, unspecified: Secondary | ICD-10-CM | POA: Diagnosis not present

## 2023-08-01 DIAGNOSIS — I4891 Unspecified atrial fibrillation: Secondary | ICD-10-CM | POA: Diagnosis not present

## 2023-08-01 DIAGNOSIS — E43 Unspecified severe protein-calorie malnutrition: Secondary | ICD-10-CM | POA: Insufficient documentation

## 2023-08-01 DIAGNOSIS — S72002A Fracture of unspecified part of neck of left femur, initial encounter for closed fracture: Secondary | ICD-10-CM | POA: Diagnosis not present

## 2023-08-01 HISTORY — PX: ANTERIOR APPROACH HEMI HIP ARTHROPLASTY: SHX6690

## 2023-08-01 LAB — BASIC METABOLIC PANEL
Anion gap: 10 (ref 5–15)
BUN: 15 mg/dL (ref 8–23)
CO2: 24 mmol/L (ref 22–32)
Calcium: 8.5 mg/dL — ABNORMAL LOW (ref 8.9–10.3)
Chloride: 102 mmol/L (ref 98–111)
Creatinine, Ser: 0.53 mg/dL (ref 0.44–1.00)
GFR, Estimated: >60 mL/min (ref >60)
Glucose, Bld: 83 mg/dL (ref 70–99)
Potassium: 3.7 mmol/L (ref 3.5–5.1)
Sodium: 136 mmol/L (ref 135–145)

## 2023-08-01 LAB — CBC
HCT: 42.1 % (ref 36.0–46.0)
Hemoglobin: 13 g/dL (ref 12.0–15.0)
MCH: 28.7 pg (ref 26.0–34.0)
MCHC: 30.9 g/dL (ref 30.0–36.0)
MCV: 92.9 fL (ref 80.0–100.0)
Platelets: 151 10*3/uL (ref 150–400)
RBC: 4.53 MIL/uL (ref 3.87–5.11)
RDW: 14.6 % (ref 11.5–15.5)
WBC: 5.7 10*3/uL (ref 4.0–10.5)
nRBC: 0 % (ref 0.0–0.2)

## 2023-08-01 LAB — TYPE AND SCREEN
ABO/RH(D): O POS
ABO/RH(D): O POS
Antibody Screen: NEGATIVE
Antibody Screen: NEGATIVE

## 2023-08-01 LAB — SURGICAL PCR SCREEN
MRSA, PCR: NEGATIVE
Staphylococcus aureus: NEGATIVE

## 2023-08-01 LAB — ABO/RH: ABO/RH(D): O POS

## 2023-08-01 LAB — SAMPLE TO BLOOD BANK

## 2023-08-01 SURGERY — HEMIARTHROPLASTY, HIP, DIRECT ANTERIOR APPROACH, FOR FRACTURE
Anesthesia: Spinal | Site: Hip | Laterality: Left

## 2023-08-01 MED ORDER — ACETAMINOPHEN 325 MG PO TABS: 325.0000 mg | ORAL_TABLET | Freq: Four times a day (QID) | ORAL | Status: DC | PRN

## 2023-08-01 MED ORDER — ONDANSETRON HCL 4 MG/2ML IJ SOLN
INTRAMUSCULAR | Status: DC | PRN
  Administered 2023-08-01: 4 mg via INTRAVENOUS

## 2023-08-01 MED ORDER — SODIUM CHLORIDE (PF) 0.9 % IJ SOLN
INTRAMUSCULAR | Status: DC | PRN
  Administered 2023-08-01: 30 mL

## 2023-08-01 MED ORDER — METOCLOPRAMIDE HCL 5 MG/ML IJ SOLN: 5.0000 mg | Freq: Three times a day (TID) | INTRAMUSCULAR | Status: DC | PRN

## 2023-08-01 MED ORDER — MUPIROCIN 2 % EX OINT: 1.0000 | TOPICAL_OINTMENT | Freq: Two times a day (BID) | CUTANEOUS | Status: DC

## 2023-08-01 MED ORDER — CHLORHEXIDINE GLUCONATE 0.12 % MT SOLN
15.0000 mL | Freq: Once | OROMUCOSAL | Status: AC
  Administered 2023-08-01: 15 mL via OROMUCOSAL

## 2023-08-01 MED ORDER — ENSURE ENLIVE PO LIQD
237.0000 mL | Freq: Two times a day (BID) | ORAL | Status: DC
  Administered 2023-08-02 – 2023-08-07 (×9): 237 mL via ORAL
  Filled 2023-08-01 (×3): qty 237

## 2023-08-01 MED ORDER — LIDOCAINE HCL (PF) 2 % IJ SOLN
INTRAMUSCULAR | Status: DC | PRN
  Administered 2023-08-01: 40 mg via INTRADERMAL

## 2023-08-01 MED ORDER — POLYETHYLENE GLYCOL 3350 17 G PO PACK: 17.0000 g | PACK | Freq: Every day | ORAL | Status: DC | PRN

## 2023-08-01 MED ORDER — HYDROCODONE-ACETAMINOPHEN 5-325 MG PO TABS
1.0000 | ORAL_TABLET | ORAL | Status: DC | PRN
  Administered 2023-08-04 – 2023-08-05 (×3): 1 via ORAL
  Filled 2023-08-01 (×3): qty 1

## 2023-08-01 MED ORDER — POVIDONE-IODINE 10 % EX SWAB: 2.0000 | Freq: Once | CUTANEOUS | Status: DC

## 2023-08-01 MED ORDER — CEFAZOLIN SODIUM-DEXTROSE 2-4 GM/100ML-% IV SOLN
2.0000 g | INTRAVENOUS | Status: AC
  Administered 2023-08-01: 2 g via INTRAVENOUS
  Filled 2023-08-01: qty 100

## 2023-08-01 MED ORDER — SODIUM CHLORIDE (PF) 0.9 % IJ SOLN
INTRAMUSCULAR | Status: AC
  Filled 2023-08-01: qty 30

## 2023-08-01 MED ORDER — SENNA 8.6 MG PO TABS
1.0000 | ORAL_TABLET | Freq: Two times a day (BID) | ORAL | Status: DC
  Administered 2023-08-02 – 2023-08-07 (×12): 8.6 mg via ORAL
  Filled 2023-08-01 (×12): qty 1

## 2023-08-01 MED ORDER — TRANEXAMIC ACID-NACL 1000-0.7 MG/100ML-% IV SOLN
1000.0000 mg | INTRAVENOUS | Status: AC
  Administered 2023-08-01: 1000 mg via INTRAVENOUS
  Filled 2023-08-01: qty 100

## 2023-08-01 MED ORDER — ACETAMINOPHEN 500 MG PO TABS
500.0000 mg | ORAL_TABLET | Freq: Four times a day (QID) | ORAL | Status: AC
  Administered 2023-08-02 (×4): 500 mg via ORAL
  Filled 2023-08-01 (×4): qty 1

## 2023-08-01 MED ORDER — PHENYLEPHRINE HCL-NACL 20-0.9 MG/250ML-% IV SOLN
INTRAVENOUS | Status: DC | PRN
Start: 2023-08-01 — End: 2023-08-01
  Administered 2023-08-01: 25 ug/min via INTRAVENOUS

## 2023-08-01 MED ORDER — SODIUM CHLORIDE 0.9 % IV SOLN: INTRAVENOUS | Status: DC

## 2023-08-01 MED ORDER — ACETAMINOPHEN 500 MG PO TABS
1000.0000 mg | ORAL_TABLET | Freq: Once | ORAL | Status: AC
  Administered 2023-08-01: 1000 mg via ORAL
  Filled 2023-08-01: qty 2

## 2023-08-01 MED ORDER — BUPIVACAINE-EPINEPHRINE (PF) 0.25% -1:200000 IJ SOLN
INTRAMUSCULAR | Status: AC
  Filled 2023-08-01: qty 30

## 2023-08-01 MED ORDER — KETOROLAC TROMETHAMINE 30 MG/ML IJ SOLN
INTRAMUSCULAR | Status: DC | PRN
  Administered 2023-08-01: 30 mg via INTRAMUSCULAR

## 2023-08-01 MED ORDER — PHENOL 1.4 % MT LIQD: 1.0000 | OROMUCOSAL | Status: DC | PRN

## 2023-08-01 MED ORDER — MENTHOL 3 MG MT LOZG: 1.0000 | LOZENGE | OROMUCOSAL | Status: DC | PRN

## 2023-08-01 MED ORDER — APIXABAN 2.5 MG PO TABS
2.5000 mg | ORAL_TABLET | Freq: Two times a day (BID) | ORAL | Status: DC
  Administered 2023-08-02 – 2023-08-07 (×11): 2.5 mg via ORAL
  Filled 2023-08-01 (×11): qty 1

## 2023-08-01 MED ORDER — 0.9 % SODIUM CHLORIDE (POUR BTL) OPTIME
TOPICAL | Status: DC | PRN
  Administered 2023-08-01: 1000 mL

## 2023-08-01 MED ORDER — DILTIAZEM HCL 60 MG PO TABS
60.0000 mg | ORAL_TABLET | Freq: Once | ORAL | Status: AC
  Administered 2023-08-01: 60 mg via ORAL
  Filled 2023-08-01: qty 1

## 2023-08-01 MED ORDER — FENTANYL CITRATE PF 50 MCG/ML IJ SOSY: 25.0000 ug | PREFILLED_SYRINGE | INTRAMUSCULAR | Status: DC | PRN

## 2023-08-01 MED ORDER — CHLORHEXIDINE GLUCONATE 4 % EX SOLN: 60.0000 mL | Freq: Once | CUTANEOUS | Status: DC

## 2023-08-01 MED ORDER — METHOCARBAMOL 1000 MG/10ML IJ SOLN: 500.0000 mg | Freq: Four times a day (QID) | INTRAMUSCULAR | Status: DC | PRN

## 2023-08-01 MED ORDER — BUPIVACAINE-EPINEPHRINE (PF) 0.25% -1:200000 IJ SOLN
INTRAMUSCULAR | Status: DC | PRN
  Administered 2023-08-01: 30 mL

## 2023-08-01 MED ORDER — STERILE WATER FOR IRRIGATION IR SOLN
Status: DC | PRN
  Administered 2023-08-01: 2000 mL

## 2023-08-01 MED ORDER — CEFAZOLIN SODIUM-DEXTROSE 2-4 GM/100ML-% IV SOLN
2.0000 g | Freq: Four times a day (QID) | INTRAVENOUS | Status: AC
  Administered 2023-08-02 (×2): 2 g via INTRAVENOUS
  Filled 2023-08-01 (×2): qty 100

## 2023-08-01 MED ORDER — PHENYLEPHRINE 80 MCG/ML (10ML) SYRINGE FOR IV PUSH (FOR BLOOD PRESSURE SUPPORT)
PREFILLED_SYRINGE | INTRAVENOUS | Status: DC | PRN
  Administered 2023-08-01 (×2): 120 ug via INTRAVENOUS

## 2023-08-01 MED ORDER — POTASSIUM CHLORIDE 10 MEQ/100ML IV SOLN
10.0000 meq | INTRAVENOUS | Status: AC
  Administered 2023-08-01 (×2): 10 meq via INTRAVENOUS
  Filled 2023-08-01 (×2): qty 100

## 2023-08-01 MED ORDER — SODIUM CHLORIDE 0.9 % IR SOLN
Status: DC | PRN
  Administered 2023-08-01: 4000 mL

## 2023-08-01 MED ORDER — BUPIVACAINE IN DEXTROSE 0.75-8.25 % IT SOLN
INTRATHECAL | Status: DC | PRN
  Administered 2023-08-01: 1.5 mL via INTRATHECAL

## 2023-08-01 MED ORDER — DOCUSATE SODIUM 100 MG PO CAPS
100.0000 mg | ORAL_CAPSULE | Freq: Two times a day (BID) | ORAL | Status: DC
  Administered 2023-08-02 – 2023-08-07 (×12): 100 mg via ORAL
  Filled 2023-08-01 (×13): qty 1

## 2023-08-01 MED ORDER — MAGNESIUM SULFATE 2 GM/50ML IV SOLN
2.0000 g | Freq: Once | INTRAVENOUS | Status: AC
  Administered 2023-08-01: 2 g via INTRAVENOUS
  Filled 2023-08-01: qty 50

## 2023-08-01 MED ORDER — PROPOFOL 1000 MG/100ML IV EMUL
INTRAVENOUS | Status: AC
  Filled 2023-08-01: qty 100

## 2023-08-01 MED ORDER — HYDROCODONE-ACETAMINOPHEN 7.5-325 MG PO TABS: 1.0000 | ORAL_TABLET | ORAL | Status: DC | PRN

## 2023-08-01 MED ORDER — KETOROLAC TROMETHAMINE 30 MG/ML IJ SOLN
INTRAMUSCULAR | Status: AC
  Filled 2023-08-01: qty 1

## 2023-08-01 MED ORDER — LACTATED RINGERS IV SOLN: INTRAVENOUS | Status: DC | PRN

## 2023-08-01 MED ORDER — MORPHINE SULFATE (PF) 2 MG/ML IV SOLN
0.5000 mg | INTRAVENOUS | Status: DC | PRN
Start: 2023-08-01 — End: 2023-08-07
  Administered 2023-08-03: 0.5 mg via INTRAVENOUS
  Filled 2023-08-01: qty 1

## 2023-08-01 MED ORDER — METHOCARBAMOL 500 MG PO TABS
500.0000 mg | ORAL_TABLET | Freq: Four times a day (QID) | ORAL | Status: DC | PRN
  Administered 2023-08-03 – 2023-08-04 (×2): 500 mg via ORAL
  Filled 2023-08-01 (×2): qty 1

## 2023-08-01 MED ORDER — PROPOFOL 500 MG/50ML IV EMUL
INTRAVENOUS | Status: DC | PRN
  Administered 2023-08-01: 50 ug/kg/min via INTRAVENOUS

## 2023-08-01 MED ORDER — METOCLOPRAMIDE HCL 5 MG PO TABS: 5.0000 mg | ORAL_TABLET | Freq: Three times a day (TID) | ORAL | Status: DC | PRN

## 2023-08-01 MED ORDER — PROPOFOL 10 MG/ML IV BOLUS
INTRAVENOUS | Status: DC | PRN
  Administered 2023-08-01: 30 mg via INTRAVENOUS

## 2023-08-01 SURGICAL SUPPLY — 50 items
BAG COUNTER SPONGE SURGICOUNT (BAG) ×1 IMPLANT
CHLORAPREP W/TINT 26 (MISCELLANEOUS) ×1 IMPLANT
COVER SURGICAL LIGHT HANDLE (MISCELLANEOUS) ×1 IMPLANT
CUP ACETAB RINGLOC 28X43 (Cup) IMPLANT
DERMABOND ADVANCED .7 DNX12 (GAUZE/BANDAGES/DRESSINGS) ×2 IMPLANT
DRAPE IMP U-DRAPE 54X76 (DRAPES) ×1 IMPLANT
DRAPE SHEET LG 3/4 BI-LAMINATE (DRAPES) ×2 IMPLANT
DRAPE STERI IOBAN 125X83 (DRAPES) ×1 IMPLANT
DRAPE U-SHAPE 47X51 STRL (DRAPES) ×2 IMPLANT
DRESSING AQUACEL AG SP 3.5X10 (GAUZE/BANDAGES/DRESSINGS) IMPLANT
DRSG AQUACEL AG ADV 3.5X10 (GAUZE/BANDAGES/DRESSINGS) ×1 IMPLANT
DRSG AQUACEL AG SP 3.5X10 (GAUZE/BANDAGES/DRESSINGS) ×1 IMPLANT
ELECT REM PT RETURN 15FT ADLT (MISCELLANEOUS) ×1 IMPLANT
EVACUATOR 1/8 PVC DRAIN (DRAIN) IMPLANT
GLOVE BIO SURGEON STRL SZ7 (GLOVE) ×1 IMPLANT
GLOVE BIO SURGEON STRL SZ8 (GLOVE) IMPLANT
GLOVE BIO SURGEON STRL SZ8.5 (GLOVE) ×2 IMPLANT
GLOVE BIOGEL PI IND STRL 7.0 (GLOVE) IMPLANT
GLOVE BIOGEL PI IND STRL 7.5 (GLOVE) ×1 IMPLANT
GLOVE BIOGEL PI IND STRL 8 (GLOVE) IMPLANT
GLOVE BIOGEL PI IND STRL 8.5 (GLOVE) ×1 IMPLANT
GOWN SPEC L3 XXLG W/TWL (GOWN DISPOSABLE) IMPLANT
GOWN STRL REUS W/ TWL XL LVL3 (GOWN DISPOSABLE) ×1 IMPLANT
GOWN STRL REUS W/TWL 2XL LVL3 (GOWN DISPOSABLE) ×1 IMPLANT
HEAD MOD COCR 28MM HD -3MM NK (Orthopedic Implant) IMPLANT
HOOD PEEL AWAY T7 (MISCELLANEOUS) ×3 IMPLANT
KIT TURNOVER KIT A (KITS) IMPLANT
MANIFOLD NEPTUNE II (INSTRUMENTS) ×1 IMPLANT
MARKER SKIN DUAL TIP RULER LAB (MISCELLANEOUS) ×1 IMPLANT
NDL SPNL 18GX3.5 QUINCKE PK (NEEDLE) ×1 IMPLANT
NEEDLE SPNL 18GX3.5 QUINCKE PK (NEEDLE) ×1 IMPLANT
NS IRRIG 1000ML POUR BTL (IV SOLUTION) ×1 IMPLANT
PACK ANTERIOR HIP CUSTOM (KITS) ×1 IMPLANT
PENCIL SMOKE EVACUATOR (MISCELLANEOUS) IMPLANT
SAW OSC TIP CART 19.5X105X1.3 (SAW) ×1 IMPLANT
SEALER BIPOLAR AQUA 6.0 (INSTRUMENTS) ×1 IMPLANT
SET HNDPC FAN SPRY TIP SCT (DISPOSABLE) ×1 IMPLANT
SOLUTION PRONTOSAN WOUND 350ML (IRRIGATION / IRRIGATOR) ×1 IMPLANT
STAPLER VISISTAT (STAPLE) IMPLANT
STEM POROUS COATED 11X142 (Stem) IMPLANT
SUT MNCRL AB 3-0 PS2 18 (SUTURE) ×1 IMPLANT
SUT MON AB 2-0 CT1 36 (SUTURE) ×1 IMPLANT
SUT STRATAFIX 14 PDO 48 VLT (SUTURE) ×1 IMPLANT
SUT STRATAFIX PDO 1 14 VIOLET (SUTURE) ×1 IMPLANT
SUT VIC AB 1 CT1 27XBRD ANTBC (SUTURE) ×1 IMPLANT
SUT VIC AB 2-0 CT1 TAPERPNT 27 (SUTURE) ×1 IMPLANT
TOWEL GREEN STERILE FF (TOWEL DISPOSABLE) ×1 IMPLANT
TRAY FOLEY MTR SLVR 14FR STAT (SET/KITS/TRAYS/PACK) IMPLANT
TUBE SUCTION HIGH CAP CLEAR NV (SUCTIONS) ×1 IMPLANT
WATER STERILE IRR 1000ML POUR (IV SOLUTION) ×2 IMPLANT

## 2023-08-01 NOTE — Anesthesia Procedure Notes (Signed)
 Spinal  Patient location during procedure: OR Start time: 08/01/2023 6:50 PM End time: 08/01/2023 6:52 PM Reason for block: surgical anesthesia Staffing Performed: anesthesiologist  Anesthesiologist: Mal Amabile, MD Performed by: Mal Amabile, MD Authorized by: Mal Amabile, MD   Preanesthetic Checklist Completed: patient identified, IV checked, site marked, risks and benefits discussed, surgical consent, monitors and equipment checked, pre-op evaluation and timeout performed Spinal Block Patient position: left lateral decubitus Prep: DuraPrep and site prepped and draped Patient monitoring: heart rate, cardiac monitor, continuous pulse ox and blood pressure Approach: midline Location: L3-4 Injection technique: single-shot Needle Needle type: Pencan  Needle gauge: 24 G Needle length: 9 cm Needle insertion depth: 5 cm Assessment Sensory level: T4 Events: CSF return Additional Notes Patient tolerated procedure well. Adequate sensory level.

## 2023-08-01 NOTE — Progress Notes (Signed)
  Progress Note   Patient: [name]:[id number] DOB: 1930-10-29 DOA: 07/30/2023     1 DOS: the patient was seen and examined on 08/01/2023 at 9:20 AM      Brief hospital course: 89 y.o. F with dementia, pAF on Eliquis, lives with family, had an unwitnessed fall, and found to have left hip fracture.     Assessment and Plan: * Closed fracture of hip, left, initial encounter (HCC) - To the OR today  Protein-calorie malnutrition, severe - Consult nutrition  Dementia - Recommend delirium precautions - Continue home sertraline  Paroxysmal atrial fibrillation (HCC) Rate controlled overnight on home diltiazem.  Up this morning somewhat.  No evidence of decompensated heart failure or chest discomfort/ischemia. - Continue home diltiazem - Hold Eliquis, resume postop when cleared by orthopedics  - Attention perioperatively, likely to need IV metrolol perioperatively - Keep K>4, mag>2        Subjective: Patient feels well, denies chest pain, palpitations, dyspnea, orthopnea, swelling, confusion, agitation.     Physical Exam: BP (!) 109/53 (BP Location: Right Arm)   Pulse (!) 127 Comment: notify to the nurse.  Temp 98.2 F (36.8 C) (Oral)   Resp (!) 22   Ht 5' (1.524 m)   Wt 44 kg   SpO2 96%   BMI 18.94 kg/m   Elderly adult female, lying in bed, makes eye contact, responds to questions, appears to have dementia Tachycardic, irregular, no murmurs, no peripheral edema, no JVD Respiratory rate seems normal, lungs clear without rales or wheezes Abdomen soft without tenderness palpation or guarding, no ascites or distention Attention normal, affect normal, judgment and insight appear impaired by dementia but at baseline, generalized weakness in the upper extremities, leg strength not tested due to fracture    Data Reviewed: Patient metabolic panel unremarkable CBC unremarkable Telemetry reviewed, atrial fibrillation overnight, rates up to 140s this morning,  well-controlled overnight.   After diltiazem dose this morning, her heart rate came back down to 90  Family Communication: daughters At the bedside    Disposition: Status is: Inpatient         Author: Alberteen Sam, MD 08/01/2023 3:29 PM  For on call review [email or web address].

## 2023-08-01 NOTE — Op Note (Signed)
 OPERATIVE REPORT  SURGEON: Samson Frederic, MD   ASSISTANT: Skip Mayer, PA-C  PREOPERATIVE DIAGNOSIS: Displaced Left femoral neck fracture.   POSTOPERATIVE DIAGNOSIS: Displaced Left femoral neck fracture.   PROCEDURE: Left hip hemiarthroplasty, anterior approach.   IMPLANTS: Biomet Taperloc Complete Reduced Distal stem, size 11 x 142 mm, high offset, with a 28 - 3 mm metal head ball and a 43 mm bipolar head ball.  ANESTHESIA:  MAC and Spinal  ANTIBIOTICS: 2g ancef.  ESTIMATED BLOOD LOSS:-100 mL    DRAINS: None.  COMPLICATIONS: None   CONDITION: PACU - hemodynamically stable.   BRIEF CLINICAL NOTE: [name] is a 89 y.o. female with a displaced Left femoral neck fracture. The patient was admitted to the hospitalist service and underwent perioperative risk stratification and medical optimization. The risks, benefits, and alternatives to hemiarthroplasty were explained, and the patient elected to proceed.  PROCEDURE IN DETAIL: The patient was taken to the operating room and general anesthesia was induced on the hospital bed.  The patient was then positioned on the Hana table.  All bony prominences were well padded.  The hip was prepped and draped in the normal sterile surgical fashion.  A time-out was called verifying side and site of surgery. Antibiotics were given within 60 minutes of beginning the procedure.   Bikini incision was made, and the direct anterior approach to the hip was performed through the Hueter interval.  Superficial dissection was carried out lateral to the ASIS. Lateral femoral circumflex vessels were treated with the Auqumantys. The anterior capsule was exposed and an inverted T capsulotomy was made. Fracture hematoma was encountered and evacuated. The patient was found to have a comminuted Left subcapital femoral neck fracture.  Inferior pubofemoral ligament was released subperiosteally to the lesser trochanter. I freshened the femoral neck cut with a saw.  I  removed the femoral neck fragment.  A corkscrew was placed into the head and the head was removed.  This was passed to the back table and was measured.   Acetabular exposure was achieved.  I examined the articular cartilage which was intact.  The labrum was intact. A 43 mm trial head was placed and found to have excellent fit.   I then gained femoral exposure taking care to protect the abductors and greater trochanter.  The superior capsule was incised longitudinally, staying lateral to the posterior border of the femoral neck. External rotation, extension, and adduction were applied.  A cookie cutter was used to enter the femoral canal, and then the femoral canal finder was used to confirm location.  I then sequentially broached up to a size 11.  Calcar planer was used on the femoral neck remnant.  I placed a high offset neck and a bipolar trial head ball. The hip was reduced.  Leg lengths were checked fluoroscopically.  The hip was dislocated and trial components were removed.  I placed the real stem followed by the real spacer and head ball.  A single reduction maneuver was performed and the hip was reduced.  Fluoroscopy was used to confirm component position and leg lengths.  At 90 degrees of external rotation and extension, the hip was stable to an anterior directed force.   The wound was copiously irrigated with Irrisept solution and normal saline using pulse lavage.  Marcaine solution was injected into the periarticular soft tissue.  The wound was closed in layers using #1 Stratafix for the fascia, 2-0 Vicryl for the subcutaneous fat, 2-0 Monocryl for the deep dermal layer,  and staples + Dermabond for the skin.  Once the glue was fully dried, an Aquacell Ag dressing was applied.  The patient was then awakened from anesthesia and transported to the recovery room in stable condition.  Sponge, needle, and instrument counts were correct at the end of the case x2.  The patient tolerated the procedure well  and there were no known complications.  Please note that a surgical assistant was a medical necessity for this procedure to perform it in a safe and expeditious manner. Assistant was necessary to provide appropriate retraction of vital neurovascular structures, to prevent femoral fracture, and to allow for anatomic placement of the prosthesis.

## 2023-08-01 NOTE — Progress Notes (Signed)
 Initial Nutrition Assessment  DOCUMENTATION CODES:   Severe malnutrition in context of chronic illness  INTERVENTION:   -Ensure Plus High Protein po BID, each supplement provides 350 kcal and 20 grams of protein.   NUTRITION DIAGNOSIS:   Severe Malnutrition related to chronic illness (Alzheimer's dementia) as evidenced by severe fat depletion, severe muscle depletion.  GOAL:   Patient will meet greater than or equal to 90% of their needs  MONITOR:   PO intake, Supplement acceptance  REASON FOR ASSESSMENT:   Consult Hip fracture protocol  ASSESSMENT:   89 y.o. F with dementia, pAF on Eliquis, lives with family, had an unwitnessed fall, and found to have left hip fracture.  Patient in room, multiple family members at bedside. Pt alert/oriented x 2. Most of history from daughters. States pt has fluctuating appetite and they try to get her to eat whatever she wants.  For breakfast she usually has Orgain shake, with Belvita bar and a piece of fresh fruit. For lunch she will have cheese toast with chips. Dinner varies. A late snack would be chocolate pudding or an ice cream sandwich. Pt mainly drink Coke throughout the day. They deny any issues with swallowing or chewing. Pt's family okay with patient receiving Ensure after surgery.   Pt's daughter states pt weighed ~97 lbs last week during her PT visit.  No weight for this admission recorded.  Medications: IV Mg sulfate, KCl  Labs reviewed.  NUTRITION - FOCUSED PHYSICAL EXAM:  Flowsheet Row Most Recent Value  Orbital Region Severe depletion  Upper Arm Region Severe depletion  Thoracic and Lumbar Region Moderate depletion  Buccal Region Moderate depletion  Temple Region Severe depletion  Clavicle Bone Region Severe depletion  Clavicle and Acromion Bone Region Severe depletion  Scapular Bone Region Severe depletion  Dorsal Hand Severe depletion  Patellar Region Unable to assess  Anterior Thigh Region Unable to assess   Posterior Calf Region Unable to assess  Edema (RD Assessment) None  Hair Reviewed  Eyes Reviewed  Mouth Reviewed  Skin Reviewed  Nails Reviewed       Diet Order:   Diet Order             Diet NPO time specified Except for: Sips with Meds  Diet effective now                   EDUCATION NEEDS:   No education needs have been identified at this time  Skin:  Skin Assessment: Reviewed RN Assessment  Last BM:  3/2  Height:   Ht Readings from Last 1 Encounters:  05/18/23 5\' 2"  (1.575 m)    Weight:   Wt Readings from Last 1 Encounters:  05/18/23 41.7 kg    BMI:  There is no height or weight on file to calculate BMI.  Estimated Nutritional Needs:   Kcal:  1250-1450  Protein:  60-70g  Fluid:  1.5L/day   Tilda Franco, MS, RD, LDN Inpatient Clinical Dietitian Contact via Secure chat

## 2023-08-01 NOTE — Consult Note (Signed)
 ORTHOPAEDIC CONSULTATION  REQUESTING PHYSICIAN: Danford, Earl Lites, *  PCP:  Thana Ates, MD  Chief Complaint: left hip injury  HPI: [name] is a 89 y.o. female with PMHx of  dementia, persistent A-fib on Eliquis, bronchiectasis, GERD, hearing loss, PVCs presented to the ED complaining of left hip pain after a mechanical fall.  X-rays revealed a displaced left femoral neck fracture.  She was admitted by [facility] for perioperative risk stratification and medical optimization.  Last dose of Eliquis was over 48 hours ago.  I was asked to assume care due to training in adult reconstruction.  Past Medical History:  Diagnosis Date   A-fib (HCC)    Bronchiectasis (HCC)    with multiple pneumonia back in 1990's   Bronchitis    episodic   DJD (degenerative joint disease) of knee    GERD (gastroesophageal reflux disease)    episodic symptoms   Hearing loss    chronic moderate hearing loss, worked up by ENT and hearing aids have been recommended   History of nuclear stress test 03/29/2006   NORMAL   Insomnia    PVC's (premature ventricular contractions)    on EKG, controlled on atenolol and caffeine reduction   Tinnitus    chronic   Past Surgical History:  Procedure Laterality Date   BREAST EXCISIONAL BIOPSY Left    CARDIOVERSION N/A 08/31/2015   Procedure: CARDIOVERSION;  Surgeon: Jake Bathe, MD;  Location: [facility] ENDOSCOPY;  Service: Cardiovascular;  Laterality: N/A;   CARDIOVERSION N/A 02/28/2023   Procedure: CARDIOVERSION;  Surgeon: [name], MD;  Location: [facility] INVASIVE CV LAB;  Service: Cardiovascular;  Laterality: N/A;   cyst removed     left breast   Social History   Socioeconomic History   Marital status: Divorced    Spouse name: Not on file   Number of children: Not on file   Years of education: Not on file   Highest education level: Not on file  Occupational History   Occupation: retired  Tobacco Use   Smoking status: Former    Current packs/day:  0.00    Average packs/day: 0.5 packs/day for 20.0 years (10.0 ttl pk-yrs)    Types: Cigarettes    Start date: 05/29/1948    Quit date: 05/29/1968    Years since quitting: 55.2   Smokeless tobacco: Never  Vaping Use   Vaping status: Never Used  Substance and Sexual Activity   Alcohol use: Yes    Alcohol/week: 0.0 standard drinks of alcohol    Comment: 1 can of beer at night   Drug use: No   Sexual activity: Not on file  Other Topics Concern   Not on file  Social History Narrative   Not on file   Social Drivers of Health   Financial Resource Strain: Not on file  Food Insecurity: No Food Insecurity (07/31/2023)   Hunger Vital Sign    Worried About Running Out of Food in the Last Year: Never true    Ran Out of Food in the Last Year: Never true  Transportation Needs: No Transportation Needs (07/31/2023)   PRAPARE - Administrator, Civil Service (Medical): No    Lack of Transportation (Non-Medical): No  Physical Activity: Not on file  Stress: Not on file  Social Connections: Patient Declined (07/31/2023)   Social Connection and Isolation Panel [NHANES]    Frequency of Communication with Friends and Family: Patient declined    Frequency of Social Gatherings with  Friends and Family: Patient declined    Attends Religious Services: Patient declined    Database administrator or Organizations: Patient declined    Attends Engineer, structural: Patient declined    Marital Status: Patient declined   Family History  Problem Relation Age of Onset   Stroke Father    Hypertension Father    Stroke Mother    Hypertension Mother    Asthma Sister    Clotting disorder Sister    Heart attack Neg Hx    Allergies  Allergen Reactions   Augmentin [Amoxicillin-Pot Clavulanate] Nausea And Vomiting   Biaxin [Clarithromycin] Nausea And Vomiting   Metoprolol Other (See Comments)    Patient notes she "feels funny" after taking it   Tequin [Gatifloxacin] Nausea Only   Prior to  Admission medications   Medication Sig Start Date End Date Taking? Authorizing Provider  apixaban (ELIQUIS) 2.5 MG TABS tablet TAKE 1 TABLET(2.5 MG) BY MOUTH TWICE DAILY 06/01/22  Yes Sheilah Pigeon, PA-C  Cholecalciferol (VITAMIN D3) 2000 units TABS Take 2,000 Units by mouth daily. Gummy   Yes [provider]  diltiazem (CARDIZEM CD) 120 MG 24 hr capsule Take 1 capsule (120 mg total) by mouth daily. Patient taking differently: Take 120 mg by mouth at bedtime. 04/23/23 07/31/23 Yes Eustace Pen, PA-C  ipratropium-albuterol (DUONEB) 0.5-2.5 (3) MG/3ML SOLN Take 3 mLs by nebulization every 6 (six) hours as needed. 05/18/23  Yes Cobb, Ruby Cola, NP  sertraline (ZOLOFT) 50 MG tablet Take 50 mg by mouth daily.   Yes [provider]   DG Knee Left Port Result Date: 08/01/2023 CLINICAL DATA:  Closed displaced fracture of left femoral neck. EXAM: PORTABLE LEFT KNEE - 1-2 VIEW COMPARISON:  None Available. FINDINGS: Normal alignment and joint spaces. No significant degenerative change. No erosion or focal bone abnormality. No joint effusion. The bones are subjectively under mineralized. No focal soft tissue abnormalities. IMPRESSION: 1. No fracture or subluxation of the left knee. 2. Subjective osteopenia. Electronically Signed   By: Narda Rutherford M.D.   On: 08/01/2023 09:57   CT Head Wo Contrast Result Date: 07/31/2023 CLINICAL DATA:  Head trauma, minor (Age >= 65y); Neck trauma (Age >= 65y) EXAM: CT HEAD WITHOUT CONTRAST CT CERVICAL SPINE WITHOUT CONTRAST TECHNIQUE: Multidetector CT imaging of the head and cervical spine was performed following the standard protocol without intravenous contrast. Multiplanar CT image reconstructions of the cervical spine were also generated. RADIATION DOSE REDUCTION: This exam was performed according to the departmental dose-optimization program which includes automated exposure control, adjustment of the mA and/or kV according to patient size and/or use  of iterative reconstruction technique. COMPARISON:  CT head 06/26/2022. FINDINGS: CT HEAD FINDINGS Brain: No evidence of acute infarction, hemorrhage, hydrocephalus, extra-axial collection or mass lesion/mass effect. Vascular: Calcific atherosclerosis Skull: No acute fracture. Sinuses/Orbits: Clear sinuses.  No acute orbital findings. Other: No mastoid effusions. CT CERVICAL SPINE FINDINGS Alignment: Mild anterolisthesis of C4 on C5, likely degenerative. Skull base and vertebrae: No acute fracture. Soft tissues and spinal canal: No prevertebral fluid or swelling. No visible canal hematoma. Disc levels:  Moderate multilevel degenerative change. Upper chest: Visualized lung apices are clear. Biapical pleuroparenchymal scarring. IMPRESSION: No acute intracranial or cervical spine finding. Electronically Signed   By: Feliberto Harts M.D.   On: 07/31/2023 01:40   CT Cervical Spine Wo Contrast Result Date: 07/31/2023 CLINICAL DATA:  Head trauma, minor (Age >= 65y); Neck trauma (Age >= 65y) EXAM: CT HEAD WITHOUT CONTRAST  CT CERVICAL SPINE WITHOUT CONTRAST TECHNIQUE: Multidetector CT imaging of the head and cervical spine was performed following the standard protocol without intravenous contrast. Multiplanar CT image reconstructions of the cervical spine were also generated. RADIATION DOSE REDUCTION: This exam was performed according to the departmental dose-optimization program which includes automated exposure control, adjustment of the mA and/or kV according to patient size and/or use of iterative reconstruction technique. COMPARISON:  CT head 06/26/2022. FINDINGS: CT HEAD FINDINGS Brain: No evidence of acute infarction, hemorrhage, hydrocephalus, extra-axial collection or mass lesion/mass effect. Vascular: Calcific atherosclerosis Skull: No acute fracture. Sinuses/Orbits: Clear sinuses.  No acute orbital findings. Other: No mastoid effusions. CT CERVICAL SPINE FINDINGS Alignment: Mild anterolisthesis of C4 on C5,  likely degenerative. Skull base and vertebrae: No acute fracture. Soft tissues and spinal canal: No prevertebral fluid or swelling. No visible canal hematoma. Disc levels:  Moderate multilevel degenerative change. Upper chest: Visualized lung apices are clear. Biapical pleuroparenchymal scarring. IMPRESSION: No acute intracranial or cervical spine finding. Electronically Signed   By: Feliberto Harts M.D.   On: 07/31/2023 01:40   DG Chest Port 1 View Result Date: 07/31/2023 CLINICAL DATA:  Status post fall. EXAM: PORTABLE CHEST 1 VIEW COMPARISON:  None Available. FINDINGS: The heart size and mediastinal contours are within normal limits. Stable chronic appearing increased interstitial lung markings are seen. No acute infiltrate, pleural effusion or pneumothorax is identified. There is no evidence of acute osseous abnormality. IMPRESSION: Chronic appearing interstitial lung markings without acute or active cardiopulmonary disease. Electronically Signed   By: Aram Candela M.D.   On: 07/31/2023 01:20   DG Hip Unilat W or Wo Pelvis 2-3 Views Left Result Date: 07/31/2023 CLINICAL DATA:  Status post fall with subsequent left hip pain. EXAM: DG HIP (WITH OR WITHOUT PELVIS) 2-3V LEFT COMPARISON:  None Available. FINDINGS: There is an acute, impacted fracture deformity is seen involving the head and neck of the proximal left femur. There is no evidence of dislocation. Degenerative changes are seen in the form of joint space narrowing and acetabular sclerosis. IMPRESSION: Acute fracture of the proximal left femur. Electronically Signed   By: Aram Candela M.D.   On: 07/31/2023 01:18    Positive ROS: All other systems have been reviewed and were otherwise negative with the exception of those mentioned in the HPI and as above.  Physical Exam: General: Alert, no acute distress Cardiovascular: No pedal edema Respiratory: No cyanosis, no use of accessory musculature GI: No organomegaly, abdomen is soft and  non-tender Skin: No lesions in the area of chief complaint Neurologic: Sensation intact distally Psychiatric: Patient is pleasantly confused  Lymphatic: No axillary or cervical lymphadenopathy  MUSCULOSKELETAL: Examination of the left hip reveals no skin wounds or lesions.  She is shortened and externally rotated.  Pain with attempted logrolling of the hip.  Positive motor function dorsiflexion, plantarflexion, and great toe extension.  Unable to assess sensation due to mental status.  Foot is warm and well-perfused.  Assessment: Displaced left femoral neck fracture. Dementia. A-fib on Eliquis.  Plan: I discussed the findings with the patient and her family.  Unfortunately, she has a displaced left femoral neck fracture.  We discussed operative versus nonoperative treatment.  Without surgery, she will suffer from morbidity/mortality due to immobility.  I recommend left hip hemiarthroplasty for pain control and immediate mobilization out of bed.  We discussed the risks, benefits, and alternatives.  Please see statement of risk.  Plan for surgery today.  Continue to hold Eliquis.  Continue  NPO.  All questions were solicited and answered.  The risks, benefits, and alternatives were discussed with the patient. There are risks associated with the surgery including, but not limited to, problems with anesthesia (death), infection, instability (giving out of the joint), dislocation, differences in leg length/angulation/rotation, fracture of bones, loosening or failure of implants, hematoma (blood accumulation) which may require surgical drainage, blood clots, pulmonary embolism, nerve injury (foot drop and lateral thigh numbness), and blood vessel injury. The patient understands these risks and elects to proceed.  Jonette Pesa, MD [phone]    08/01/2023 5:36 PM

## 2023-08-01 NOTE — Assessment & Plan Note (Addendum)
-   Continue nutrition supplements

## 2023-08-01 NOTE — Progress Notes (Signed)
 Dr Malen Gauze (anesthesia) notified of cardiac issues, order received. LMOVM for [place], [place] w [place], family would like to discuss surgery before they sign the consent.

## 2023-08-01 NOTE — Anesthesia Procedure Notes (Signed)
 Procedure Name: MAC Date/Time: 08/01/2023 7:00 PM  Performed by: Vanessa Latimer, CRNAPre-anesthesia Checklist: Patient identified, Emergency Drugs available, Suction available and Patient being monitored Patient Re-evaluated:Patient Re-evaluated prior to induction Oxygen Delivery Method: Simple face mask

## 2023-08-01 NOTE — Anesthesia Postprocedure Evaluation (Addendum)
 Anesthesia Post Note  Patient: [name]  Procedure(s) Performed: HEMIARTHROPLASTY, HIP, DIRECT ANTERIOR APPROACH, FOR FRACTURE (Left: Hip)     Patient location during evaluation: PACU Anesthesia Type: Spinal Level of consciousness: awake and alert Pain management: pain level controlled Vital Signs Assessment: post-procedure vital signs reviewed and stable Respiratory status: spontaneous breathing, respiratory function stable, nonlabored ventilation and patient connected to nasal cannula oxygen Cardiovascular status: blood pressure returned to baseline and stable Postop Assessment: no headache, no backache, no apparent nausea or vomiting and spinal receding Anesthetic complications: no   No notable events documented.  Last Vitals:  Vitals:   08/01/23 2030 08/01/23 2045  BP: 99/81 98/62  Pulse: 92 81  Resp: 19 (!) 23  Temp:    SpO2: 99% 99%    Last Pain:  Vitals:   08/01/23 2045  TempSrc:   PainSc: Asleep                 Berenis Corter A.

## 2023-08-01 NOTE — Assessment & Plan Note (Addendum)
 S/p left hemiarthroplasty 3/5 by Dr. Veda Canning Asymptomatic post-op tachycardia, no anemia.  Doing well with PT. - WBAT - Eliquis for DVT ppx - PT/OT

## 2023-08-01 NOTE — Assessment & Plan Note (Addendum)
 Some mild delirium earlier today, now resolved. - Delirium precautions - Continue home sertraline

## 2023-08-01 NOTE — Assessment & Plan Note (Addendum)
 Rates again elevated.  No symptoms of ischemia.  I have titrated up diltiazem somewhat but limited by hypotension. - Consult cardiology - Continue diltiazem - Load digoxin - Continue Eliquis

## 2023-08-01 NOTE — Progress Notes (Addendum)
 Dr Maryfrances Bunnell notified of pt's increasing HR (rate briefly up to 170 per pulse oximetry, but mostly rate between 120-140's). VSS, pt denies CP or SOB, new orders noted.

## 2023-08-01 NOTE — Transfer of Care (Signed)
 Immediate Anesthesia Transfer of Care Note  Patient: [name]  Procedure(s) Performed: HEMIARTHROPLASTY, HIP, DIRECT ANTERIOR APPROACH, FOR FRACTURE (Left: Hip)  Patient Location: PACU  Anesthesia Type:Spinal  Level of Consciousness: awake  Airway & Oxygen Therapy: Patient Spontanous Breathing and Patient connected to face mask  Post-op Assessment: Report given to RN and Post -op Vital signs reviewed and stable  Post vital signs: Reviewed and stable  Last Vitals:  Vitals Value Taken Time  BP 88/55 08/01/23 2020  Temp    Pulse 82 08/01/23 2023  Resp 18 08/01/23 2023  SpO2 100 % 08/01/23 2023  Vitals shown include unfiled device data.  Last Pain:  Vitals:   08/01/23 1457  TempSrc:   PainSc: 0-No pain         Complications: No notable events documented.

## 2023-08-01 NOTE — Anesthesia Preprocedure Evaluation (Addendum)
 Anesthesia Evaluation  Patient identified by MRN, date of birth, ID band Patient awake and Patient confused    Reviewed: Allergy & Precautions, NPO status , Patient's Chart, lab work & pertinent test results, reviewed documented beta blocker date and time , Unable to perform ROS - Chart review only  Airway Mallampati: III  TM Distance: >3 FB     Dental no notable dental hx. (+) Dental Advisory Given   Pulmonary shortness of breath and with exertion, COPD,  COPD inhaler, former smoker   breath sounds clear to auscultation + decreased breath sounds      Cardiovascular + DOE  + dysrhythmias Atrial Fibrillation  Rhythm:Irregular Rate:Tachycardia  EKG 07/31/23  Atrial fibrillation with VPC, LAD  Echo 07/03/2015 Left ventricle: The cavity size was normal. Systolic function was    normal. The estimated ejection fraction was in the range of 55%    to 60%. Wall motion was normal; there were no regional wall    motion abnormalities.     Neuro/Psych  PSYCHIATRIC DISORDERS     Dementia negative neurological ROS     GI/Hepatic Neg liver ROS,GERD  Medicated,,  Endo/Other  negative endocrine ROS    Renal/GU negative Renal ROS     Musculoskeletal  (+) Arthritis , Osteoarthritis,  Impacted left femur Fx   Abdominal   Peds  Hematology Eliquis 2.5mg  BID- last dose 3/3 10am   Anesthesia Other Findings   Reproductive/Obstetrics                             Anesthesia Physical Anesthesia Plan  ASA: 3  Anesthesia Plan: Spinal   Post-op Pain Management: Minimal or no pain anticipated and Precedex   Induction: Intravenous  PONV Risk Score and Plan: 3 and Treatment may vary due to age or medical condition and Propofol infusion  Airway Management Planned: Natural Airway and Simple Face Mask  Additional Equipment: None  Intra-op Plan:   Post-operative Plan:   Informed Consent: I have reviewed the patients  History and Physical, chart, labs and discussed the procedure including the risks, benefits and alternatives for the proposed anesthesia with the patient or authorized representative who has indicated his/her understanding and acceptance.   Patient has DNR.  Suspend DNR and Discussed DNR with power of attorney.   Dental advisory given  Plan Discussed with: Anesthesiologist and CRNA  Anesthesia Plan Comments:         Anesthesia Quick Evaluation

## 2023-08-02 ENCOUNTER — Encounter (HOSPITAL_COMMUNITY): Payer: Self-pay | Admitting: Orthopedic Surgery

## 2023-08-02 DIAGNOSIS — S72002A Fracture of unspecified part of neck of left femur, initial encounter for closed fracture: Secondary | ICD-10-CM | POA: Diagnosis not present

## 2023-08-02 LAB — CBC
HCT: 42.4 % (ref 36.0–46.0)
Hemoglobin: 13.4 g/dL (ref 12.0–15.0)
MCH: 29.3 pg (ref 26.0–34.0)
MCHC: 31.6 g/dL (ref 30.0–36.0)
MCV: 92.8 fL (ref 80.0–100.0)
Platelets: 156 10*3/uL (ref 150–400)
RBC: 4.57 MIL/uL (ref 3.87–5.11)
RDW: 14.3 % (ref 11.5–15.5)
WBC: 10.1 10*3/uL (ref 4.0–10.5)
nRBC: 0 % (ref 0.0–0.2)

## 2023-08-02 LAB — BASIC METABOLIC PANEL
Anion gap: 11 (ref 5–15)
BUN: 20 mg/dL (ref 8–23)
CO2: 25 mmol/L (ref 22–32)
Calcium: 8.9 mg/dL (ref 8.9–10.3)
Chloride: 99 mmol/L (ref 98–111)
Creatinine, Ser: 0.5 mg/dL (ref 0.44–1.00)
GFR, Estimated: >60 mL/min (ref >60)
Glucose, Bld: 102 mg/dL — ABNORMAL HIGH (ref 70–99)
Potassium: 4.4 mmol/L (ref 3.5–5.1)
Sodium: 135 mmol/L (ref 135–145)

## 2023-08-02 MED ORDER — DILTIAZEM HCL 25 MG/5ML IV SOLN
10.0000 mg | Freq: Four times a day (QID) | INTRAVENOUS | Status: DC | PRN
  Administered 2023-08-03: 10 mg via INTRAVENOUS
  Filled 2023-08-02 (×3): qty 5

## 2023-08-02 MED ORDER — IPRATROPIUM BROMIDE 0.02 % IN SOLN
0.5000 mg | Freq: Every day | RESPIRATORY_TRACT | Status: DC
Start: 2023-08-03 — End: 2023-08-05
  Administered 2023-08-03 – 2023-08-05 (×3): 0.5 mg via RESPIRATORY_TRACT
  Filled 2023-08-02 (×3): qty 2.5

## 2023-08-02 MED ORDER — LEVALBUTEROL HCL 0.63 MG/3ML IN NEBU
0.6300 mg | INHALATION_SOLUTION | Freq: Every day | RESPIRATORY_TRACT | Status: DC
  Administered 2023-08-04 – 2023-08-05 (×2): 0.63 mg via RESPIRATORY_TRACT
  Filled 2023-08-02 (×4): qty 3

## 2023-08-02 NOTE — Progress Notes (Signed)
 PHARMACY - ANTICOAGULATION CONSULT NOTE  Pharmacy Consult for Eliquis, resume post-op Indication: afib  Allergies  Allergen Reactions   Augmentin [Amoxicillin-Pot Clavulanate] Nausea And Vomiting   Biaxin [Clarithromycin] Nausea And Vomiting   Metoprolol Other (See Comments)    Patient notes she "feels funny" after taking it   Tequin [Gatifloxacin] Nausea Only    Patient Measurements: Height: 5' (152.4 cm) Weight: 44 kg (97 lb) IBW/kg (Calculated) : 45.5  Vital Signs: Temp: 97.5 F (36.4 C) (03/06 0631) Temp Source: Oral (03/06 0631) BP: 108/70 (03/06 0631) Pulse Rate: 100 (03/06 0631)  Labs: Recent Labs    07/30/23 2256 08/01/23 0507 08/02/23 0500  HGB 14.0 13.0 13.4  HCT 44.0 42.1 42.4  PLT 181 151 156  LABPROT 15.7*  --   --   INR 1.2  --   --   CREATININE 0.56 0.53 0.50    Estimated Creatinine Clearance: 32.5 mL/min (by C-G formula based on SCr of 0.5 mg/dL).   Medical History: Past Medical History:  Diagnosis Date   A-fib (HCC)    Bronchiectasis (HCC)    with multiple pneumonia back in 1990's   Bronchitis    episodic   DJD (degenerative joint disease) of knee    GERD (gastroesophageal reflux disease)    episodic symptoms   Hearing loss    chronic moderate hearing loss, worked up by ENT and hearing aids have been recommended   History of nuclear stress test 03/29/2006   NORMAL   Insomnia    PVC's (premature ventricular contractions)    on EKG, controlled on atenolol and caffeine reduction   Tinnitus    chronic    Assessment: AC/Heme: PTA Eliquis for afib on HOLD, SCDs CBC remains stable, Hgb 13.4, Plts 156.   Goal of Therapy:  Therapeutic oral anticoagulation  Plan:  -Eliquis ordered to resume 3/6 by ortho MD 2.5mg  BID as PTA   [name] S. Merilynn Finland, PharmD, BCPS Clinical Staff Pharmacist Misty Stanley Stillinger 08/02/2023,7:24 AM

## 2023-08-02 NOTE — [place] FL2 (Addendum)
 [place] MEDICAID FL2 LEVEL OF CARE FORM     IDENTIFICATION  Patient Name: [name] Birthdate: 24-Jan-1931 Sex: female Admission Date (Current Location): 07/30/2023  [facility] and [place] Number:  Producer, television/film/video and Address:  [facility],  [address], [place] [address]      Provider Number: [id number]  Attending Physician Name and Address:  Alberteen Sam, *  Relative Name and Phone Number:  daughter, Ladene Artist @ [phone]    Current Level of Care: Hospital Recommended Level of Care: Skilled Nursing Facility Prior Approval Number:    Date Approved/Denied:   PASRR Number: [id number]  Discharge Plan: SNF    Current Diagnoses: Patient Active Problem List   Diagnosis Date Noted   Protein-calorie malnutrition, severe 08/01/2023   Closed fracture of hip, left, initial encounter (HCC) 07/31/2023   QT prolongation 07/31/2023   Dementia 07/31/2023   Varicose vein of leg 11/02/2016   Anticoagulated 05/02/2016   Dyspnea 11/04/2015   Lower extremity edema 10/15/2015   Atrial fibrillation with RVR (HCC)    Atrial fibrillation with rapid ventricular response (HCC) 07/02/2015   Paroxysmal atrial fibrillation (HCC) 07/02/2015   Bronchiectasis (HCC) 02/22/2015   Pseudomonas aeruginosa colonization 02/22/2015   COPD mixed type (HCC) 12/28/2014   DOE (dyspnea on exertion) 12/28/2014   Palpitations 09/11/2013   Supraventricular premature beats 07/18/2013   Occlusion and stenosis of carotid artery without mention of cerebral infarction 07/18/2013   Nuclear cataract 07/07/2011    Orientation RESPIRATION BLADDER Height & Weight     Self  Normal Continent (currently with purewick) Weight: 97 lb (44 kg) Height:  5' (152.4 cm)  BEHAVIORAL SYMPTOMS/MOOD NEUROLOGICAL BOWEL NUTRITION STATUS      Continent Diet (regular)  AMBULATORY STATUS COMMUNICATION OF NEEDS Skin   Limited Assist   Other (Comment) (surgica incision only)                        Personal Care Assistance Level of Assistance  Bathing, Dressing Bathing Assistance: Limited assistance   Dressing Assistance: Limited assistance     Functional Limitations Info  Sight, Hearing, Speech Sight Info: Adequate Hearing Info: Impaired Speech Info: Adequate    SPECIAL CARE FACTORS FREQUENCY  PT (By licensed PT), OT (By licensed OT)     PT Frequency: 5x/wk OT Frequency: 5x/wk            Contractures Contractures Info: Not present    Additional Factors Info  Code Status, Allergies Code Status Info: DNR Allergies Info: Augmentin (Amoxicillin-pot Clavulanate), Biaxin (Clarithromycin), Metoprolol, Tequin (Gatifloxacin)           Current Medications (08/03/2023):  This is the current hospital active medication list Current Facility-Administered Medications  Medication Dose Route Frequency Provider Last Rate Last Admin   0.9 % NaCl with KCl 20 mEq/ L  infusion   Intravenous Continuous Alberteen Sam, MD 75 mL/hr at 08/03/23 0954 New Bag at 08/03/23 0954   acetaminophen (TYLENOL) tablet 325-650 mg  325-650 mg Oral Q6H PRN Swinteck, Arlys John, MD       acetaminophen (TYLENOL) tablet 650 mg  650 mg Oral Q6H PRN Samson Frederic, MD   650 mg at 07/31/23 1900   apixaban (ELIQUIS) tablet 2.5 mg  2.5 mg Oral BID Samson Frederic, MD   2.5 mg at 08/03/23 0940   diltiazem (CARDIZEM CD) 24 hr capsule 180 mg  180 mg Oral Daily Alberteen Sam, MD   180 mg at 08/03/23  6962   diltiazem (CARDIZEM) tablet 30 mg  30 mg Oral Q6H PRN Alberteen Sam, MD   30 mg at 08/03/23 9528   docusate sodium (COLACE) capsule 100 mg  100 mg Oral BID Samson Frederic, MD   100 mg at 08/03/23 0940   feeding supplement (ENSURE ENLIVE / ENSURE PLUS) liquid 237 mL  237 mL Oral BID BM Swinteck, Arlys John, MD   237 mL at 08/03/23 1007   fentaNYL (SUBLIMAZE) injection 12.5 mcg  12.5 mcg Intravenous Q2H PRN Swinteck, Arlys John, MD       HYDROcodone-acetaminophen (NORCO) 7.5-325 MG per tablet  1-2 tablet  1-2 tablet Oral Q4H PRN Swinteck, Arlys John, MD       HYDROcodone-acetaminophen (NORCO/VICODIN) 5-325 MG per tablet 1-2 tablet  1-2 tablet Oral Q4H PRN Swinteck, Arlys John, MD       ipratropium (ATROVENT) nebulizer solution 0.5 mg  0.5 mg Nebulization Daily Danford, Earl Lites, MD   0.5 mg at 08/03/23 0751   levalbuterol (XOPENEX) nebulizer solution 0.63 mg  0.63 mg Nebulization Q6H PRN Samson Frederic, MD   0.63 mg at 08/03/23 0751   levalbuterol (XOPENEX) nebulizer solution 0.63 mg  0.63 mg Nebulization Daily Danford, Earl Lites, MD       menthol-cetylpyridinium (CEPACOL) lozenge 3 mg  1 lozenge Oral PRN Samson Frederic, MD       Or   phenol (CHLORASEPTIC) mouth spray 1 spray  1 spray Mouth/Throat PRN Swinteck, Arlys John, MD       methocarbamol (ROBAXIN) tablet 500 mg  500 mg Oral Q6H PRN Samson Frederic, MD   500 mg at 08/03/23 0125   Or   methocarbamol (ROBAXIN) injection 500 mg  500 mg Intravenous Q6H PRN Swinteck, Arlys John, MD       metoCLOPramide (REGLAN) tablet 5-10 mg  5-10 mg Oral Q8H PRN Swinteck, Arlys John, MD       Or   metoCLOPramide (REGLAN) injection 5-10 mg  5-10 mg Intravenous Q8H PRN Swinteck, Arlys John, MD       morphine (PF) 2 MG/ML injection 0.5-1 mg  0.5-1 mg Intravenous Q2H PRN Swinteck, Arlys John, MD   0.5 mg at 08/03/23 0216   naloxone [facility]) injection 0.4 mg  0.4 mg Intravenous PRN Samson Frederic, MD       oxyCODONE (Oxy IR/ROXICODONE) immediate release tablet 5 mg  5 mg Oral Q6H PRN Swinteck, Arlys John, MD   5 mg at 08/02/23 0038   polyethylene glycol (MIRALAX / GLYCOLAX) packet 17 g  17 g Oral Daily PRN Swinteck, Arlys John, MD       potassium PHOSPHATE 30 mmol in dextrose 5 % 500 mL infusion  30 mmol Intravenous Once Alberteen Sam, MD 85 mL/hr at 08/03/23 0941 30 mmol at 08/03/23 0941   senna (SENOKOT) tablet 8.6 mg  1 tablet Oral BID Samson Frederic, MD   8.6 mg at 08/03/23 0940   sertraline (ZOLOFT) tablet 50 mg  50 mg Oral Daily Samson Frederic, MD   50 mg at 08/03/23  0940     Discharge Medications: Please see discharge summary for a list of discharge medications.  Relevant Imaging Results:  Relevant Lab Results:   Additional Information SS# [id number]  Amada Jupiter, LCSW

## 2023-08-02 NOTE — Progress Notes (Signed)
    Subjective:  Patient less agitated compared to preop. No events.  Objective:   VITALS:   Vitals:   08/01/23 2145 08/01/23 2204 08/01/23 2307 08/02/23 0631  BP: (!) 97/54 (!) 116/56 114/70 108/70  Pulse: 86 88 93 100  Resp: (!) 21 16 17 18   Temp:  97.6 F (36.4 C) 97.6 F (36.4 C) (!) 97.5 F (36.4 C)  TempSrc:  Oral Oral Oral  SpO2: 98% 99% 100% 98%  Weight:      Height:        AMS NAD, resting comfortably Intact pulses distally Dorsiflexion/Plantar flexion intact Incision: dressing C/D/I Compartment soft   Lab Results  Component Value Date   WBC 10.1 08/02/2023   HGB 13.4 08/02/2023   HCT 42.4 08/02/2023   MCV 92.8 08/02/2023   PLT 156 08/02/2023   BMET    Component Value Date/Time   NA 135 08/02/2023 0500   NA 144 02/16/2023 1302   K 4.4 08/02/2023 0500   CL 99 08/02/2023 0500   CO2 25 08/02/2023 0500   GLUCOSE 102 (H) 08/02/2023 0500   BUN 20 08/02/2023 0500   BUN 14 02/16/2023 1302   CREATININE 0.50 08/02/2023 0500   CREATININE 0.75 02/23/2016 1427   CALCIUM 8.9 08/02/2023 0500   EGFR 71 02/16/2023 1302   GFRNONAA >60 08/02/2023 0500     Assessment/Plan: 1 Day Post-Op   Principal Problem:   Closed fracture of hip, left, initial encounter (HCC) Active Problems:   Paroxysmal atrial fibrillation (HCC)   Dementia   Protein-calorie malnutrition, severe   WBAT with walker DVT ppx:  apixaban , SCDs, TEDS PO pain control PT/OT Dispo: D/C planning   Iline Oven Maybelline Kolarik 08/02/2023, 10:42 AM   Samson Frederic, MD [phone] [facility] is now [facility]  [place] [address], [place], [place] [address] Phone: [phone] [email or web address] Facebook  [employer]

## 2023-08-02 NOTE — Progress Notes (Signed)
  Progress Note   Patient: [name] ZOX:[id number] DOB: 1931-04-15 DOA: 07/30/2023     2 DOS: the patient was seen and examined on 08/02/2023 at 10:54AM      Brief hospital course: 89 y.o. F with dementia, pAF on Eliquis, lives with family, had an unwitnessed fall, and found to have left hip fracture.     Assessment and Plan: * Closed fracture of hip, left, initial encounter [facility]) Status post left hemiarthroplasty 3/5 by Dr. Linna Caprice -WBAT - PT/OT - Apixaban for DVT prophylaxis  Protein-calorie malnutrition, severe -Continue nutrition supplements  Dementia - Delirium precautions - Continue home sertraline  Paroxysmal atrial fibrillation (HCC) Rate fluctuating.  Likely exacerbated by pain - Continue home diltiazem, resume Eliquis  .  IV diltiazem for severe tachycardia          Subjective: Patient feels okay, her pain is well-controlled.  She has had no focal neurological symptoms, no chest discomfort, trouble breathing, no complaints of palpitations      Physical Exam: BP (!) 100/59 (BP Location: Left Arm)   Pulse 99   Temp (!) 97.5 F (36.4 C) (Oral)   Resp 15   Ht 5' (1.524 m)   Wt 44 kg   SpO2 92%   BMI 18.94 kg/m   Frail elderly female, lying in bed, interactive and appropriate Tachycardic, irregular, no murmurs, no peripheral edema Respiratory rate seems normal, lung sounds diminished but no rales or wheezes appreciated Abdomen soft without tenderness palpation The left hip incision bandages clean dry and intact She makes eye contact, responds to questions, face is symmetric, speech is fluent, she has dementia but her mental status appears at baseline, she has generalized weakness in the upper extremities bilaterally, lower extremity strength not tested due to pain    Data Reviewed: Patient metabolic panel unremarkable CBC without significant anemia  Family Communication: Daughters at the bedside   Disposition: Status is: Inpatient           Author: Alberteen Sam, MD 08/02/2023 1:52 PM  For on call review [email or web address].

## 2023-08-02 NOTE — TOC Initial Note (Signed)
 Transition of Care [facility]) - Initial/Assessment Note    Patient Details  Name: [name] MRN: [id number] Date of Birth: 1930/12/24  Transition of Care [facility]) CM/SW Contact:    Amada Jupiter, LCSW Phone Number: 08/02/2023, 4:31 PM  Clinical Narrative:                  Met with pt and two daughters today to review dc planning needs.  Daughter, Waynetta Sandy, confirms that pt has lived with her/ family for 17 yrs and managing well overall.  Noted pt's dementia has been worsening the past few years but remains independent with mobility and ADLs.  Pt is followed by the palliative team with [facility] and the Guide program.  We discussed therapy recommendations for SNF rehab and daughters are aware and in agreement.  Reviewed preferred facilities and will begin SNF bed search.    Expected Discharge Plan: Skilled Nursing Facility Barriers to Discharge: SNF Pending bed offer   Patient Goals and CMS Choice Patient states their goals for this hospitalization and ongoing recovery are:: return home after rehab   Choice offered to / list presented to : Adult Children [place] ownership interest in [facility].provided to:: Adult Children    Expected Discharge Plan and Services In-house Referral: Clinical Social Work   Post Acute Care Choice: Skilled Nursing Facility Living arrangements for the past 2 months: Single Family Home                 DME Arranged: N/A DME Agency: NA                  Prior Living Arrangements/Services Living arrangements for the past 2 months: Single Family Home Lives with:: Adult Children Patient language and need for interpreter reviewed:: Yes Do you feel safe going back to the place where you live?: Yes      Need for Family Participation in Patient Care: Yes (Comment) Care giver support system in place?: Yes (comment)   Criminal Activity/Legal Involvement Pertinent to Current Situation/Hospitalization: No - Comment as needed  Activities of Daily  Living   ADL Screening (condition at time of admission) Independently performs ADLs?: Yes (appropriate for developmental age) Is the patient deaf or have difficulty hearing?: No Does the patient have difficulty seeing, even when wearing glasses/contacts?: No Does the patient have difficulty concentrating, remembering, or making decisions?: No  Permission Sought/Granted Permission sought to share information with : Family Supports Permission granted to share information with : Yes, Verbal Permission Granted  Share Information with NAME: daughter, Ladene Artist @ [phone]  Permission granted to share info w AGENCY: SNFs        Emotional Assessment Appearance:: Appears stated age Attitude/Demeanor/Rapport: Gracious Affect (typically observed): Pleasant Orientation: : Oriented to Self Alcohol / Substance Use: Not Applicable Psych Involvement: No (comment)  Admission diagnosis:  Hip fracture (HCC) [S72.009A] Atrial fibrillation with RVR (HCC) [I48.91] Closed fracture of neck of left femur, initial encounter (HCC) [S72.002A] Patient Active Problem List   Diagnosis Date Noted   Protein-calorie malnutrition, severe 08/01/2023   Closed fracture of hip, left, initial encounter (HCC) 07/31/2023   QT prolongation 07/31/2023   Dementia 07/31/2023   Varicose vein of leg 11/02/2016   Anticoagulated 05/02/2016   Dyspnea 11/04/2015   Lower extremity edema 10/15/2015   Atrial fibrillation with RVR (HCC)    Atrial fibrillation with rapid ventricular response (HCC) 07/02/2015   Paroxysmal atrial fibrillation (HCC) 07/02/2015   Bronchiectasis (HCC) 02/22/2015   Pseudomonas aeruginosa colonization  02/22/2015   COPD mixed type (HCC) 12/28/2014   DOE (dyspnea on exertion) 12/28/2014   Palpitations 09/11/2013   Supraventricular premature beats 07/18/2013   Occlusion and stenosis of carotid artery without mention of cerebral infarction 07/18/2013   Nuclear cataract 07/07/2011   PCP:  Thana Ates, MD Pharmacy:   [facility] [phone] [place], [place] - [address] AT [facility] [address] [place] [address] Phone: [phone] Fax: [phone]  [facility] - [place], [place] - [address] [place] [address] Phone: [phone] Fax: [phone]  [place], [place] - [address] [place] [address] [place] [address] Phone: [phone] Fax: [phone]     Social Drivers of Health (SDOH) Social History: SDOH Screenings   Food Insecurity: No Food Insecurity (07/31/2023)  Housing: Unknown (07/31/2023)  Transportation Needs: No Transportation Needs (07/31/2023)  Utilities: Not At Risk (07/31/2023)  Social Connections: Patient Declined (07/31/2023)  Tobacco Use: Medium Risk (08/01/2023)   SDOH Interventions:     Readmission Risk Interventions    08/02/2023    4:19 PM  Readmission Risk Prevention Plan  Post Dischage Appt Complete  Medication Screening Complete  Transportation Screening Complete

## 2023-08-02 NOTE — Progress Notes (Signed)
 30 Day PASRR Note   Patient Details  Name: [name] Date of Birth: 1931-05-23   Transition of Care [facility]) CM/SW Contact:    Amada Jupiter, LCSW Phone Number: 08/02/2023, 4:06 PM  To Whom It May Concern:  Please be advised that this patient will require a short-term nursing home stay - anticipated 30 days or less for rehabilitation and strengthening.   The plan is for return home.

## 2023-08-02 NOTE — Evaluation (Signed)
 Occupational Therapy Evaluation Patient Details Name: [name] MRN: [id number] DOB: Sep 06, 1930 Today's Date: 08/02/2023   History of Present Illness   [name] is an 89 y.o. female. HPI: Ambria had a unwitnessed fall at home day before admission. Her daughter states she found her laying on the floor seemingly complaining of left hip pain and unable to get up.  She was brought to the emergency department via EMS and found to have a left hip fracture. Pt now s/p Left hip hemiarthroplasty, anterior approach. Past  medical history significant of dementia, persistent A-fib on Eliquis, bronchiectasis, GERD, hearing loss, PVCs     Clinical Impressions Patient is currently requiring as high as maximum assistance with basic ADLs, as well as  moderate assist with bed mobility and minimal assist of 2 with functional transfers to recliner to simulate BSC with use of RW and max multimodal cues.   Current level of function is below patient's typical baseline, however daughters expressed concern of gradual decline in pt's function and increase incidence of falls.    During this evaluation, patient was limited by LLE post-op pain once standing, generalized weakness, impaired activity tolerance, and severe cognitive deficits complicated by hearing loss, all of which has the potential to impact patient's and/or caregivers' safety and independence during functional mobility, as well as performance for ADLs.    Patient lives in her own basement apartment attached to family home with daughter, SIL and grandson who  are able to provide 24/7 supervision and assistance.  Patient demonstrates fair rehab potential, and should benefit from continued skilled occupational therapy services while in acute care to maximize safety, independence and quality of life at home.  Continued occupational therapy services are recommended. Patient will benefit from continued inpatient follow up therapy, <3 hours/day.   ?       If plan is discharge home, recommend the following:   Two people to help with walking and/or transfers;A little help with walking and/or transfers;Supervision due to cognitive status     Functional Status Assessment   Patient has had a recent decline in their functional status and demonstrates the ability to make significant improvements in function in a reasonable and predictable amount of time.     Equipment Recommendations    (TBD at rehab facility)     Recommendations for Other Services         Precautions/Restrictions   Precautions Precautions: Fall Recall of Precautions/Restrictions: Impaired (Alzheimer'as dementia) Restrictions Weight Bearing Restrictions Per Provider Order: No RLE Weight Bearing Per Provider Order: Weight bearing as tolerated LLE Weight Bearing Per Provider Order: Weight bearing as tolerated     Mobility Bed Mobility Overal bed mobility: Needs Assistance Bed Mobility: Supine to Sit     Supine to sit: Mod assist     General bed mobility comments: assist to raise trunk, pivot hips & advance LLE    Transfers Overall transfer level: Needs assistance Equipment used: Rolling walker (2 wheels)               General transfer comment: assist to power up; pt took several pivotal steps to recliner with verbal/manual cues for sequencing and management of RW      Balance Overall balance assessment: Needs assistance Sitting-balance support: Single extremity supported, Feet supported Sitting balance-Leahy Scale: Good     Standing balance support: During functional activity, Reliant on assistive device for balance, Bilateral upper extremity supported Standing balance-Leahy Scale: Poor  ADL either performed or assessed with clinical judgement   ADL Overall ADL's : Needs assistance/impaired Eating/Feeding: Set up;Supervision/ safety;Cueing for sequencing   Grooming: Sitting;Contact guard  assist;Set up;Supervision/safety;Cueing for sequencing   Upper Body Bathing: Sitting;Supervision/ safety;Contact guard assist;Cueing for sequencing   Lower Body Bathing: Moderate assistance;Maximal assistance;Sitting/lateral leans;Bed level;Cueing for sequencing;Cueing for safety   Upper Body Dressing : Cueing for sequencing;Minimal assistance;Sitting   Lower Body Dressing: Maximal assistance;Cueing for sequencing;Bed level;Sitting/lateral leans   Toilet Transfer: Minimal assistance;+2 for physical assistance;Rolling walker (2 wheels) Toilet Transfer Details (indicate cue type and reason): Pt stood from EOB to RW with Min Assist of 2 for safety. Pt's memory deficits caused pt to look surprised with standing as she felt the LLE pain and required Min As of 2 to steady pt on RW for safety.  Pt with step by step cues to pivot to RW with cues for sequence and need of RW blocked as pt attempting to push too far away from her.  Pt having difficulty with safely completing stand-step to recliner, therefore PT pulled the recliner closer to pt and pt assisted to sitting.  Pt did some posterior scooting in chair but then required Total Assist of 2 to complete. Toileting- Clothing Manipulation and Hygiene: Maximal assistance;Moderate assistance;Cueing for sequencing       Functional mobility during ADLs: Minimal assistance;Rolling walker (2 wheels);Cueing for sequencing;Cueing for safety;+2 for physical assistance       Vision Baseline Vision/History: 1 Wears glasses Vision Assessment?: No apparent visual deficits     Perception         Praxis         Pertinent Vitals/Pain Pain Assessment Pain Assessment: PAINAD Breathing: occasional labored breathing, short period of hyperventilation Negative Vocalization: occasional moan/groan, low speech, negative/disapproving quality Facial Expression: facial grimacing Body Language: tense, distressed pacing, fidgeting Consolability: distracted or  reassured by voice/touch PAINAD Score: 6 Pain Intervention(s): Limited activity within patient's tolerance, Monitored during session, Premedicated before session, Repositioned, Ice applied (Pt denied pain once in chair. Only showed pain signs with standing and taking steps.)     Extremity/Trunk Assessment Upper Extremity Assessment Upper Extremity Assessment: Overall WFL for tasks assessed;Right hand dominant   Lower Extremity Assessment Lower Extremity Assessment: Defer to PT evaluation LLE Deficits / Details: knee ext at least 3/5, hip flexion ~+2/5 LLE: Unable to fully assess due to pain LLE Sensation: WNL LLE Coordination: WNL   Cervical / Trunk Assessment Cervical / Trunk Assessment: Kyphotic   Communication Communication Communication: Impaired Factors Affecting Communication: Hearing impaired   Cognition Arousal: Alert Behavior During Therapy: Restless Cognition: History of cognitive impairments             OT - Cognition Comments: h/o Alzheimer's dementia.                 Following commands: Impaired Following commands impaired: Follows one step commands inconsistently     Cueing  General Comments   Cueing Techniques: Verbal cues;Gestural cues;Tactile cues      Exercises     Shoulder Instructions      Home Living Family/patient expects to be discharged to:: Private residence Living Arrangements: Children Available Help at Discharge: Available 24 hours/day (Dtr, pt's SIL and grandson.) Type of Home: Apartment (attached to family home in basement.) Home Access: Stairs to enter [employer] of Steps: 1 Entrance Stairs-Rails: None Home Layout: One level     Bathroom Shower/Tub: Chief Strategy Officer: Standard     Home Equipment: Transport  chair          Prior Functioning/Environment Prior Level of Function : Needs assist;History of Falls (last six months)  Cognitive Assist : ADLs (cognitive)   ADLs (Cognitive):  Intermittent cues Physical Assist : ADLs (physical)   ADLs (physical): IADLs;Bathing Mobility Comments: Pt was working on PT HEP with dtr. Dtr noticed pt with increased unsteadiness prior to her injury and perhaps had been falling before hand. Walks without AD. ADLs Comments: Dtr provides medication management, all IADLs, and assist with bathing as needed.    OT Problem List: Pain;Impaired balance (sitting and/or standing);Decreased knowledge of precautions;Decreased knowledge of use of DME or AE;Decreased cognition;Decreased safety awareness   OT Treatment/Interventions: Self-care/ADL training;Therapeutic activities;Balance training;DME and/or AE instruction;Patient/family education;Therapeutic exercise      OT Goals(Current goals can be found in the care plan section)   Acute Rehab OT Goals Patient Stated Goal: Daughters agreeable to dischare recommendations for SNF OT Goal Formulation: With family Time For Goal Achievement: 08/16/23 Potential to Achieve Goals: Fair (due to demtnia) ADL Goals Pt Will Perform Lower Body Dressing: with min assist;sitting/lateral leans;sit to/from stand (Improve standing balance to fair static to assist caregivers with donning/doffing clothing over hips and with LE bathing.) Pt Will Transfer to Toilet: ambulating;with supervision;bedside commode;regular height toilet Pt Will Perform Toileting - Clothing Manipulation and hygiene: with min assist;sitting/lateral leans;sit to/from stand Pt/caregiver will Perform Home Exercise Program: Increased strength;Both right and left upper extremity;With Supervision (tactile/visual/verbal cues) Additional ADL Goal #1: Patient's family will identify at least 3 fall prevention strategies to setup at pt's home in order to maximize function and safety during ADLs and decrease caregiver burden while preventing possible injury and rehospitalization.   OT Frequency:  Min 1X/week    Co-evaluation PT/OT/SLP  Co-Evaluation/Treatment: Yes Reason for Co-Treatment: To address functional/ADL transfers;For patient/therapist safety PT goals addressed during session: Mobility/safety with mobility OT goals addressed during session: ADL's and self-care      AM-PAC OT "6 Clicks" Daily Activity     Outcome Measure Help from another person eating meals?: A Little Help from another person taking care of personal grooming?: A Little Help from another person toileting, which includes using toliet, bedpan, or urinal?: A Lot Help from another person bathing (including washing, rinsing, drying)?: A Lot Help from another person to put on and taking off regular upper body clothing?: A Little Help from another person to put on and taking off regular lower body clothing?: A Lot 6 Click Score: 15   End of Session Equipment Utilized During Treatment: Gait belt;Rolling walker (2 wheels) Nurse Communication: Other (comment) (RN in with pain meds and saline locked IV for ease with therapy)  Activity Tolerance: Patient tolerated treatment well;Patient limited by pain Patient left: in chair;with call bell/phone within reach;with chair alarm set;with family/visitor present  OT Visit Diagnosis: Unsteadiness on feet (R26.81);Pain;Repeated falls (R29.6);History of falling (Z91.81);Other symptoms and signs involving cognitive function Pain - Right/Left: Left Pain - part of body: Hip                Time: 9604-5409 OT Time Calculation (min): 30 min Charges:  OT General Charges $OT Visit: 1 Visit OT Evaluation $OT Eval Moderate Complexity: 1 Mod  Annalyse Langlais, OT Acute Rehab Services Office: [phone] 08/02/2023   Theodoro Clock 08/02/2023, 1:01 PM

## 2023-08-02 NOTE — Evaluation (Signed)
 Physical Therapy Evaluation Patient Details Name: [name] MRN: [id number] DOB: 11-Dec-1930 Today's Date: 08/02/2023  History of Present Illness  [name] is an 89 y.o. female. HPI: Ineta had a unwitnessed fall at home day before admission. Her daughter states she found her laying on the floor seemingly complaining of left hip pain and unable to get up.  She was brought to the emergency department via EMS and found to have a left hip fracture. Pt now s/p Left hip hemiarthroplasty, anterior approach. Past  medical history significant of dementia, persistent A-fib on Eliquis, bronchiectasis, GERD, hearing loss, PVCs  Clinical Impression  Pt admitted with above diagnosis. At baseline pt ambulates without an assistive device. Today pt required mod assist for supine to sit, Min assist of 2 for sit to stand, she was able to take a few steps forward then a few pivotal steps to recliner with RW, distance limited by pain and difficulty following commands for sequencing with ambulation. AAROM performed to L hip with good tolerance. Patient will benefit from continued inpatient follow up therapy, <3 hours/day.  Pt currently with functional limitations due to the deficits listed below (see PT Problem List). Pt will benefit from acute skilled PT to increase their independence and safety with mobility to allow discharge.           If plan is discharge home, recommend the following: A lot of help with walking and/or transfers;A lot of help with bathing/dressing/bathroom;Assistance with cooking/housework;Direct supervision/assist for financial management;Assist for transportation;Help with stairs or ramp for entrance   Can travel by private vehicle        Equipment Recommendations Rolling walker (2 wheels)  Recommendations for Other Services       Functional Status Assessment Patient has had a recent decline in their functional status and demonstrates the ability to make significant improvements in  function in a reasonable and predictable amount of time.     Precautions / Restrictions Precautions Precautions: Fall Recall of Precautions/Restrictions: Impaired (Alzheimer'as dementia) Restrictions Weight Bearing Restrictions Per Provider Order: No RLE Weight Bearing Per Provider Order: Weight bearing as tolerated LLE Weight Bearing Per Provider Order: Weight bearing as tolerated      Mobility  Bed Mobility Overal bed mobility: Needs Assistance Bed Mobility: Supine to Sit     Supine to sit: Mod assist     General bed mobility comments: assist to raise trunk, pivot hips & advance LLE    Transfers Overall transfer level: Needs assistance Equipment used: Rolling walker (2 wheels) Transfers: Sit to/from Stand, Bed to chair/wheelchair/BSC Sit to Stand: Min assist, +2 physical assistance, +2 safety/equipment           General transfer comment: assist to power up; pt took several pivotal steps to recliner with verbal/manual cues for sequencing and management of RW    Ambulation/Gait Ambulation/Gait assistance: Min assist, +2 physical assistance, +2 safety/equipment Gait Distance (Feet): 3 Feet Assistive device: Rolling walker (2 wheels) Gait Pattern/deviations: Step-to pattern, Decreased step length - right, Decreased step length - left       General Gait Details: VCs for sequencing, pt had difficulty advancing LLE with max verbal cues, took a few steps forward then pivoted to recliner  Stairs            Wheelchair Mobility     Tilt Bed    Modified Rankin (Stroke Patients Only)       Balance Overall balance assessment: Needs assistance Sitting-balance support: Single extremity supported, Feet supported Sitting  balance-Leahy Scale: Good     Standing balance support: Bilateral upper extremity supported, During functional activity, Reliant on assistive device for balance Standing balance-Leahy Scale: Poor                                Pertinent Vitals/Pain Pain Assessment Breathing: occasional labored breathing, short period of hyperventilation Negative Vocalization: occasional moan/groan, low speech, negative/disapproving quality Facial Expression: facial grimacing Body Language: tense, distressed pacing, fidgeting Consolability: distracted or reassured by voice/touch PAINAD Score: 6 Pain Intervention(s): Monitored during session, Repositioned, Ice applied, Limited activity within patient's tolerance    Home Living Family/patient expects to be discharged to:: Private residence Living Arrangements: Children Available Help at Discharge: Available 24 hours/day (Dtr, pt's SIL and grandson.) Type of Home: Apartment (attached to family home in basement.) Home Access: Stairs to enter Entrance Stairs-Rails: None Entrance Stairs-Number of Steps: 1   Home Layout: One level Home Equipment: Transport chair      Prior Function Prior Level of Function : Needs assist;History of Falls (last six months)  Cognitive Assist : ADLs (cognitive)   ADLs (Cognitive): Intermittent cues Physical Assist : ADLs (physical)   ADLs (physical): IADLs;Bathing Mobility Comments: Pt was working on PT HEP with dtr. Dtr noticed pt with increased unsteadiness prior to her injury and perhaps had been falling before hand. Walks without AD. ADLs Comments: Dtr provides medication management, all IADLs, and assist with bathing as needed.     Extremity/Trunk Assessment   Upper Extremity Assessment Upper Extremity Assessment: Overall WFL for tasks assessed;Right hand dominant    Lower Extremity Assessment Lower Extremity Assessment: Defer to PT evaluation LLE Deficits / Details: knee ext at least 3/5, hip flexion ~+2/5 LLE: Unable to fully assess due to pain LLE Sensation: WNL LLE Coordination: WNL    Cervical / Trunk Assessment Cervical / Trunk Assessment: Kyphotic  Communication   Communication Communication: Impaired Factors  Affecting Communication: Hearing impaired    Cognition Arousal: Alert Behavior During Therapy: WFL for tasks assessed/performed   PT - Cognitive impairments: History of cognitive impairments                         Following commands: Impaired Following commands impaired: Follows one step commands inconsistently     Cueing Cueing Techniques: Verbal cues, Gestural cues, Tactile cues     General Comments      Exercises General Exercises - Lower Extremity Ankle Circles/Pumps: AROM, Both, 10 reps, Supine Heel Slides: AAROM, Left, 5 reps, Supine Hip ABduction/ADduction: AAROM, Left, 5 reps, Supine   Assessment/Plan    PT Assessment Patient needs continued PT services  PT Problem List Decreased activity tolerance;Decreased range of motion;Decreased strength;Pain;Decreased balance;Decreased mobility;Decreased cognition       PT Treatment Interventions Gait training;DME instruction;Functional mobility training;Therapeutic activities;Therapeutic exercise    PT Goals (Current goals can be found in the Care Plan section)  Acute Rehab PT Goals Patient Stated Goal: go to rehab PT Goal Formulation: With family Time For Goal Achievement: 08/16/23 Potential to Achieve Goals: Good    Frequency Min 3X/week     Co-evaluation PT/OT/SLP Co-Evaluation/Treatment: Yes Reason for Co-Treatment: To address functional/ADL transfers;For patient/therapist safety PT goals addressed during session: Mobility/safety with mobility OT goals addressed during session: ADL's and self-care       AM-PAC PT "6 Clicks" Mobility  Outcome Measure Help needed turning from your back to your side while in a flat  bed without using bedrails?: A Lot Help needed moving from lying on your back to sitting on the side of a flat bed without using bedrails?: A Lot Help needed moving to and from a bed to a chair (including a wheelchair)?: A Lot Help needed standing up from a chair using your arms (e.g.,  wheelchair or bedside chair)?: A Lot Help needed to walk in hospital room?: A Lot Help needed climbing 3-5 steps with a railing? : Total 6 Click Score: 11    End of Session Equipment Utilized During Treatment: Gait belt Activity Tolerance: Patient limited by pain;Patient limited by fatigue;Patient tolerated treatment well Patient left: in chair;with chair alarm set;with call bell/phone within reach;with family/visitor present Nurse Communication: Mobility status PT Visit Diagnosis: Unsteadiness on feet (R26.81);History of falling (Z91.81);Difficulty in walking, not elsewhere classified (R26.2);Pain;Muscle weakness (generalized) (M62.81) Pain - Right/Left: Left Pain - part of body: Hip    Time: 4540-9811 PT Time Calculation (min) (ACUTE ONLY): 26 min   Charges:   PT Evaluation $PT Eval Moderate Complexity: 1 Mod   PT General Charges $$ ACUTE PT VISIT: 1 Visit         Tamala Ser PT 08/02/2023  Acute Rehabilitation Services  Office [phone]

## 2023-08-02 NOTE — Progress Notes (Signed)
 Daughters questioned if pt was wheezing. They shared her having a history of COPD. Nurse heard pt making a sound in her throat area. However, no c/o difficulty breathing; no visible distress. Lungs remain unchanged from previous assessment- Clear in upper lobes, diminished in bases. Daughter questioned if she had a breathing treatment ordered. Shared with them that she has Xopenex ordered prn. They requested that she have one. Placed call to Respiratory therapist to come and administer it. Informed daughters of this and that the respiratory therapist will be up to administer it, shortly.

## 2023-08-03 DIAGNOSIS — S72002A Fracture of unspecified part of neck of left femur, initial encounter for closed fracture: Secondary | ICD-10-CM | POA: Diagnosis not present

## 2023-08-03 LAB — BASIC METABOLIC PANEL
Anion gap: 7 (ref 5–15)
BUN: 22 mg/dL (ref 8–23)
CO2: 27 mmol/L (ref 22–32)
Calcium: 7.9 mg/dL — ABNORMAL LOW (ref 8.9–10.3)
Chloride: 102 mmol/L (ref 98–111)
Creatinine, Ser: 0.63 mg/dL (ref 0.44–1.00)
GFR, Estimated: >60 mL/min (ref >60)
Glucose, Bld: 118 mg/dL — ABNORMAL HIGH (ref 70–99)
Potassium: 4 mmol/L (ref 3.5–5.1)
Sodium: 136 mmol/L (ref 135–145)

## 2023-08-03 LAB — CBC
HCT: 38.2 % (ref 36.0–46.0)
Hemoglobin: 12.2 g/dL (ref 12.0–15.0)
MCH: 29.6 pg (ref 26.0–34.0)
MCHC: 31.9 g/dL (ref 30.0–36.0)
MCV: 92.7 fL (ref 80.0–100.0)
Platelets: 146 10*3/uL — ABNORMAL LOW (ref 150–400)
RBC: 4.12 MIL/uL (ref 3.87–5.11)
RDW: 14.5 % (ref 11.5–15.5)
WBC: 7.7 10*3/uL (ref 4.0–10.5)
nRBC: 0 % (ref 0.0–0.2)

## 2023-08-03 LAB — PHOSPHORUS: Phosphorus: 1.9 mg/dL — ABNORMAL LOW (ref 2.5–4.6)

## 2023-08-03 LAB — MAGNESIUM: Magnesium: 1.7 mg/dL (ref 1.7–2.4)

## 2023-08-03 MED ORDER — POTASSIUM CHLORIDE IN NACL 20-0.9 MEQ/L-% IV SOLN
INTRAVENOUS | Status: DC
  Filled 2023-08-03 (×2): qty 1000

## 2023-08-03 MED ORDER — POTASSIUM PHOSPHATES 15 MMOLE/5ML IV SOLN
30.0000 mmol | Freq: Once | INTRAVENOUS | Status: AC
  Administered 2023-08-03: 30 mmol via INTRAVENOUS
  Filled 2023-08-03: qty 10

## 2023-08-03 MED ORDER — ACETAMINOPHEN 500 MG PO TABS
1000.0000 mg | ORAL_TABLET | Freq: Three times a day (TID) | ORAL | Status: AC
Start: 2023-08-03 — End: 2023-09-02

## 2023-08-03 MED ORDER — DILTIAZEM HCL 30 MG PO TABS
30.0000 mg | ORAL_TABLET | Freq: Four times a day (QID) | ORAL | Status: DC | PRN
  Administered 2023-08-03 – 2023-08-07 (×4): 30 mg via ORAL
  Filled 2023-08-03 (×8): qty 1

## 2023-08-03 MED ORDER — SODIUM CHLORIDE 0.9 % IV BOLUS
250.0000 mL | Freq: Once | INTRAVENOUS | Status: AC
  Administered 2023-08-03: 250 mL via INTRAVENOUS

## 2023-08-03 MED ORDER — DILTIAZEM HCL ER COATED BEADS 180 MG PO CP24
180.0000 mg | ORAL_CAPSULE | Freq: Every day | ORAL | Status: DC
  Administered 2023-08-03 – 2023-08-05 (×3): 180 mg via ORAL
  Filled 2023-08-03 (×3): qty 1

## 2023-08-03 MED ORDER — TRAMADOL HCL 50 MG PO TABS: 50.0000 mg | ORAL_TABLET | Freq: Four times a day (QID) | ORAL | 0 refills | Status: DC | PRN

## 2023-08-03 NOTE — Progress Notes (Signed)
 Physical Therapy Treatment Patient Details Name: [name] MRN: [id number] DOB: 10-03-1930 Today's Date: 08/03/2023   History of Present Illness [name] is an 89 y.o. female. HPI: [name] had a unwitnessed fall at home day before admission. Her daughter states she found her laying on the floor seemingly complaining of left hip pain and unable to get up.  She was brought to the emergency department via EMS and found to have a left hip fracture. Pt now s/p Left hip hemiarthroplasty, anterior approach. Past  medical history significant of dementia, persistent A-fib on Eliquis, bronchiectasis, GERD, hearing loss, PVCs    PT Comments  Noted cardiac events from overnight with HR up to 150's. Pt's HR was in 90s at rest at start of session, 109 max with activity. +2 total assist for supine to sit, mod assist for sitting balance 2* posterior lean. +2 total assist for sit to stand x 2 trials with RW, BLEs buckled in standing both attempts. Assisted pt back to supine, performed L hip AAROM exercises. Placed bed in chair position. Pt's 2 daughters present. Patient will benefit from continued inpatient follow up therapy, <3 hours/day.      If plan is discharge home, recommend the following: A lot of help with bathing/dressing/bathroom;Assistance with cooking/housework;Direct supervision/assist for financial management;Assist for transportation;Help with stairs or ramp for entrance;Two people to help with walking and/or transfers   Can travel by private vehicle        Equipment Recommendations  Rolling walker (2 wheels)    Recommendations for Other Services       Precautions / Restrictions Precautions Precautions: Fall Recall of Precautions/Restrictions: Impaired (Alzheimer'as dementia) Restrictions Weight Bearing Restrictions Per Provider Order: No RLE Weight Bearing Per Provider Order: Weight bearing as tolerated LLE Weight Bearing Per Provider Order: Weight bearing as tolerated      Mobility  Bed Mobility Overal bed mobility: Needs Assistance Bed Mobility: Supine to Sit, Sit to Supine     Supine to sit: Total assist, +2 for safety/equipment, +2 for physical assistance Sit to supine: +2 for physical assistance, +2 for safety/equipment, Total assist   General bed mobility comments: assist to raise trunk, pivot hips & advance BLE; +2 total assist for sit to supine    Transfers Overall transfer level: Needs assistance Equipment used: Rolling walker (2 wheels) Transfers: Sit to/from Stand Sit to Stand: Total assist, +2 physical assistance, +2 safety/equipment           General transfer comment: assist to power up, pt not able to maintain upright standing with sit to stand x 2 trials, BLEs buckled in standing. Assisted pt back to supine and put bed in chair position    Ambulation/Gait                   Stairs             Wheelchair Mobility     Tilt Bed    Modified Rankin (Stroke Patients Only)       Balance Overall balance assessment: Needs assistance Sitting-balance support: Single extremity supported, Feet supported, Bilateral upper extremity supported Sitting balance-Leahy Scale: Poor Sitting balance - Comments: mod A for posterior lean Postural control: Posterior lean Standing balance support: Bilateral upper extremity supported, During functional activity, Reliant on assistive device for balance Standing balance-Leahy Scale: Zero                              Communication  Communication Communication: Impaired Factors Affecting Communication: Hearing impaired  Cognition Arousal: Alert Behavior During Therapy: WFL for tasks assessed/performed   PT - Cognitive impairments: History of cognitive impairments                         Following commands: Impaired Following commands impaired: Follows one step commands inconsistently    Cueing Cueing Techniques: Verbal cues, Gestural cues, Tactile cues   Exercises General Exercises - Lower Extremity Ankle Circles/Pumps: Both, 10 reps, Supine, AAROM Heel Slides: AAROM, Left, Supine, 10 reps Hip ABduction/ADduction: AAROM, Left, 5 reps, Supine    General Comments        Pertinent Vitals/Pain Pain Assessment Pain Assessment: PAINAD Breathing: occasional labored breathing, short period of hyperventilation Negative Vocalization: occasional moan/groan, low speech, negative/disapproving quality Facial Expression: sad, frightened, frown Body Language: tense, distressed pacing, fidgeting Consolability: distracted or reassured by voice/touch PAINAD Score: 5 Pain Intervention(s): Limited activity within patient's tolerance, Monitored during session, Ice applied, Repositioned    Home Living                          Prior Function            PT Goals (current goals can now be found in the care plan section) Acute Rehab PT Goals Patient Stated Goal: go to rehab PT Goal Formulation: With family Time For Goal Achievement: 08/16/23 Potential to Achieve Goals: Good Progress towards PT goals: Not progressing toward goals - comment (decreased tolerance of activity, increased assistance needed)    Frequency    Min 3X/week      PT Plan      Co-evaluation PT/OT/SLP Co-Evaluation/Treatment: Yes Reason for Co-Treatment: To address functional/ADL transfers;For patient/therapist safety PT goals addressed during session: Mobility/safety with mobility OT goals addressed during session: ADL's and self-care      AM-PAC PT "6 Clicks" Mobility   Outcome Measure  Help needed turning from your back to your side while in a flat bed without using bedrails?: A Lot Help needed moving from lying on your back to sitting on the side of a flat bed without using bedrails?: Total Help needed moving to and from a bed to a chair (including a wheelchair)?: Total Help needed standing up from a chair using your arms (e.g., wheelchair or bedside  chair)?: Total Help needed to walk in hospital room?: Total Help needed climbing 3-5 steps with a railing? : Total 6 Click Score: 7    End of Session Equipment Utilized During Treatment: Gait belt Activity Tolerance: Patient limited by pain;Patient limited by fatigue;Patient tolerated treatment well Patient left: in chair;with chair alarm set;with call bell/phone within reach;with family/visitor present Nurse Communication: Mobility status PT Visit Diagnosis: Unsteadiness on feet (R26.81);History of falling (Z91.81);Difficulty in walking, not elsewhere classified (R26.2);Pain;Muscle weakness (generalized) (M62.81) Pain - Right/Left: Left Pain - part of body: Hip     Time: 1610-9604 PT Time Calculation (min) (ACUTE ONLY): 33 min  Charges:    $Therapeutic Activity: 23-37 mins PT General Charges $$ ACUTE PT VISIT: 1 Visit                     Tamala Ser PT 08/03/2023  Acute Rehabilitation Services  Office [phone]

## 2023-08-03 NOTE — Progress Notes (Signed)
 Placed call to pt's daughter, [name]. Updated her on events around pt's heart rate and interventions. She thanked nurse for the updated.

## 2023-08-03 NOTE — Progress Notes (Signed)
    Subjective:  Patient less agitated compared to preop. No events.  Objective:   VITALS:   Vitals:   08/03/23 0751 08/03/23 0840 08/03/23 0911 08/03/23 1234  BP:  114/76 (!) 140/121 127/71  Pulse:  (!) 138 (!) 150   Resp:  (!) 22    Temp:    98.5 F (36.9 C)  TempSrc:    Oral  SpO2: 98% 94% 94% 98%  Weight:      Height:        AMS NAD, resting comfortably Intact pulses distally Dorsiflexion/Plantar flexion intact Incision: dressing C/D/I Compartment soft   Lab Results  Component Value Date   WBC 7.7 08/03/2023   HGB 12.2 08/03/2023   HCT 38.2 08/03/2023   MCV 92.7 08/03/2023   PLT 146 (L) 08/03/2023   BMET    Component Value Date/Time   NA 136 08/03/2023 0434   NA 144 02/16/2023 1302   K 4.0 08/03/2023 0434   CL 102 08/03/2023 0434   CO2 27 08/03/2023 0434   GLUCOSE 118 (H) 08/03/2023 0434   BUN 22 08/03/2023 0434   BUN 14 02/16/2023 1302   CREATININE 0.63 08/03/2023 0434   CREATININE 0.75 02/23/2016 1427   CALCIUM 7.9 (L) 08/03/2023 0434   EGFR 71 02/16/2023 1302   GFRNONAA >60 08/03/2023 0434     Assessment/Plan: 2 Days Post-Op   Principal Problem:   Closed fracture of hip, left, initial encounter (HCC) Active Problems:   Paroxysmal atrial fibrillation (HCC)   Dementia   Protein-calorie malnutrition, severe   WBAT with walker DVT ppx:  apixaban , SCDs, TEDS PO pain control PT/OT Dispo: D/C planning   Iline Oven Sheniya Garciaperez 08/03/2023, 1:57 PM   Samson Frederic, MD [phone] [facility] is now [facility]  [place] [address], [address], [place], [place] [address] Phone: [phone] [email or web address] Facebook  [employer]

## 2023-08-03 NOTE — Progress Notes (Signed)
 Messaged Anthoney Harada, NP of the following: Confused and pulling at things; placed mittens and fidgeting at things. Given PO Robaxin. Heart rate continues to be in the range of 128-150. Wearing 2lpm via [place]. I was told by previous nurse that the doctor wanted her off of the o2. But, I had to put it on, as she has been wearing it, prior. But reapplied it due to room air sats 87%. Sats 97% with it on.  [name] then asked what is the current BP. Shared that the current vital signs are as follows: Temp-98.1, respirations-20, sats 97% on 2lpm via [place], bp-120/91, heart rate 133. [name] then stated to give the IV Cardizem. Shared with her that staff on unit can't push it. Nurse notified rapid response nurse- Mandy of the above and that there is a need for IV push Cardizem. Completed 12 lead EKG per her instruction. Communicated with pharmacy to send IV Cardizem to unit. Pt remains warm, dry,no visible distress. She does remain fidgety and grabbing at things, confused to place, time, situation. [name] stated she would be up to unit.

## 2023-08-03 NOTE — Plan of Care (Signed)
 ?  Problem: Clinical Measurements: ?Goal: Will remain free from infection ?Outcome: Progressing ?  ?

## 2023-08-03 NOTE — Progress Notes (Signed)
    Patient Name: [name]           DOB: 28-Jan-1931  MRN: [id number]      Admission Date: 07/30/2023  Attending Provider: Alberteen Sam, *  Primary Diagnosis: Closed fracture of hip, left, initial encounter [facility])   Level of care: Med-Surg    CROSS COVER NOTE   Date of Service   08/03/2023   [name], 89 y.o. female, was admitted on 07/30/2023 for Closed fracture of hip, left, initial encounter (HCC).    HPI/Events of Note   Afib RVR, sustaining 140 - 150 pbm Hemodynamically stable, does not appear to be in acute distress. She is restless in bed trying to remove mitts.Confused at baseline from dementia, but responds to name. Denies SOB or CP.   HR started to increase (HR 120s) after administration of Xopenex. Could be a combination of pain and neb tx driving HR up. PAINAD score 7. Low dose Morphine administered. PRN IV cardizem also administered for RVR. HR now sustaining 100-120's.   Addendum: BP 84/62 after morphine and cardizem, administering 250 cc bolus.    Interventions/ Plan   PRN cardizem IV morphine Fluid bolus        Anthoney Harada, DNP, ACNPC- AG [facility] [place]

## 2023-08-03 NOTE — Progress Notes (Signed)
  Progress Note   Patient: [name] UJW:[id number] DOB: 17-Dec-1930 DOA: 07/30/2023     3 DOS: the patient was seen and examined on 08/03/2023 at 9:45AM and 7:03PM      Brief hospital course: 89 y.o. F with dementia, pAF on Eliquis, lives with family, had an unwitnessed fall, and found to have left hip fracture.     Assessment and Plan: * Closed fracture of hip, left, initial encounter [facility]) S/p hemiarthroplasty, doing well   Dementia - Recommend to avoid unnecessary monitoring, O2 and restraints; advance mobility - Sertraline  Paroxysmal atrial fibrillation (HCC) Tachycardic overnight.  This appears exacerbated by pain.  Asymptomatic and hemodynamically stable - Continue home diltiazem, adjust slightly - Continue Eliquis          Subjective: A little delirious.  No dyspnea, chest pain.       Physical Exam: BP 97/75 (BP Location: Left Arm)   Pulse (!) 107   Temp 97.9 F (36.6 C) (Oral)   Resp (!) 22   Ht 5' (1.524 m)   Wt 44 kg   SpO2 (!) 87%   BMI 18.94 kg/m   Thin elderly female, lying in bed, appears slightly delirious. Tachycardic, irregular No rales or wheezes. Abdomen soft Dressing CDI Has memory impairment but face symmetric, UE strength symmetric  Data Reviewed: BMP normal CBC no anemia Mild hypophosphatemia, treated with Kphos  Family Communication: Daughters    Disposition: Status is: Inpatient         Author: Alberteen Sam, MD 08/03/2023 7:11 PM  For on call review [email or web address].

## 2023-08-03 NOTE — Significant Event (Signed)
 Rapid Response Event Note   Reason for Call :  Afib vs. Aflutter per telemetry with rates 120-150.  Initial Focused Assessment:  Patient at baseline disorientation, but with possible delirium as she hasn't slept per nursing report. O2 at 2L/[place] in place. Wearing mitts to keep from pulling off devices but patient remains fidgety (mostly trying to remove mitts). Heart rate rapid and irregular. No abnormal lung sounds. Indwelling foley present draining urine, 200 ml documented output earlier in shift.  Interventions:  EKG, Cardizem 10mg  IV push given as prescribed. Monitored VS and telemetry. Gave Morphine 0.5mg  for PAINAD score 7 to as increased heart rate may be symptom of pain. 250 ml NS IV fluid bolus given to run over 1 hour. Removed mitts while at bedside to see if this decreased patient's restlessness and fidgeting.   Plan of Care:  Interventions seem to be decreasing HR, pain, and restlessness by end of visit. Complete fluid bolus and contact provider and rapid RN if any changes in condition. Advised leaving room lighting low and keeping mitts off if able. Maintain current level of care at this time.   Event Summary:   MD Notified: A. Virgel Manifold, NP at bedside Call Time: 0137 Arrival Time: 0143 End Time: 0250  Lamona Curl, RN

## 2023-08-03 NOTE — Progress Notes (Signed)
 Rapid response nurse-Mandy and Anthoney Harada, NP are both at pt's bedside.

## 2023-08-04 DIAGNOSIS — S72002A Fracture of unspecified part of neck of left femur, initial encounter for closed fracture: Secondary | ICD-10-CM | POA: Diagnosis not present

## 2023-08-04 NOTE — TOC Progression Note (Signed)
 Transition of Care [facility]) - Progression Note    Patient Details  Name: [name] MRN: [id number] Date of Birth: 12-Jun-1930  Transition of Care [facility]) CM/SW Contact  Adrian Prows, RN Phone Number: 08/04/2023, 11:49 AM  Clinical Narrative:    Sherron Monday w/ Joyce Gross at [facility]; she says waiting to see how pt progresses over weekend without mitts.   Expected Discharge Plan: Skilled Nursing Facility Barriers to Discharge: SNF Pending bed offer  Expected Discharge Plan and Services In-house Referral: Clinical Social Work   Post Acute Care Choice: Skilled Nursing Facility Living arrangements for the past 2 months: Single Family Home                 DME Arranged: N/A DME Agency: NA                   Social Determinants of Health (SDOH) Interventions SDOH Screenings   Food Insecurity: No Food Insecurity (07/31/2023)  Housing: Unknown (07/31/2023)  Transportation Needs: No Transportation Needs (07/31/2023)  Utilities: Not At Risk (07/31/2023)  Social Connections: Patient Declined (07/31/2023)  Tobacco Use: Medium Risk (08/01/2023)    Readmission Risk Interventions    08/02/2023    4:19 PM  Readmission Risk Prevention Plan  Post Dischage Appt Complete  Medication Screening Complete  Transportation Screening Complete

## 2023-08-04 NOTE — Plan of Care (Signed)
 ?  Problem: Clinical Measurements: ?Goal: Will remain free from infection ?Outcome: Progressing ?  ?

## 2023-08-04 NOTE — Progress Notes (Signed)
  Progress Note   Patient: [name] ZOX:[id number] DOB: September 09, 1930 DOA: 07/30/2023     4 DOS: the patient was seen and examined on 08/04/2023 at 1:10PM      Brief hospital course: 89 y.o. F with dementia, pAF on Eliquis, lives with family, had an unwitnessed fall, and found to have left hip fracture.     Assessment and Plan: * Closed fracture of hip, left, initial encounter [facility]) S/p left hemiarthroplasty 3/5 by Dr. Linna Caprice Malnutrition -WBAT - Eliquis for DVT prophylaxis - PT/OT -Continue protein supplements   Dementia Delirium Delirium seems to be resolving - Delirium precautions - Continue sertraline  Paroxysmal atrial fibrillation (HCC) Diltiazem titrated up yesterday, rates improved today - Continue diltiazem - Continue Eliquis           Subjective: Appetite much better today, pain better controlled.  No dyspnea, no chest pain, no palpitations.     Physical Exam: BP (!) 99/55 (BP Location: Left Arm)   Pulse 77   Temp (!) 97.5 F (36.4 C) (Oral)   Resp 16   Ht 5' (1.524 m)   Wt 44 kg   SpO2 91%   BMI 18.94 kg/m   Thin elderly female, lying in bed, pleasant, interactive, hard of hearing Heart rate regular, normal rate, no murmurs, no peripheral edema Respiratory rate normal, lungs clear without rales or wheezes Abdomen soft no tenderness palpation or guarding Makes eye contact, responds to questions, hard of hearing but pleasant, face symmetric, speech fluent, generalized weakness in the upper extremities with symmetric    Data Reviewed: No new labs  Family Communication: Daughter at the bedside    Disposition: Status is: Inpatient         Author: Alberteen Sam, MD 08/04/2023 3:59 PM  For on call review [email or web address].

## 2023-08-05 DIAGNOSIS — S72002A Fracture of unspecified part of neck of left femur, initial encounter for closed fracture: Secondary | ICD-10-CM | POA: Diagnosis not present

## 2023-08-05 LAB — BASIC METABOLIC PANEL
Anion gap: 8 (ref 5–15)
BUN: 15 mg/dL (ref 8–23)
CO2: 28 mmol/L (ref 22–32)
Calcium: 8.4 mg/dL — ABNORMAL LOW (ref 8.9–10.3)
Chloride: 99 mmol/L (ref 98–111)
Creatinine, Ser: 0.58 mg/dL (ref 0.44–1.00)
GFR, Estimated: >60 mL/min (ref >60)
Glucose, Bld: 103 mg/dL — ABNORMAL HIGH (ref 70–99)
Potassium: 3.9 mmol/L (ref 3.5–5.1)
Sodium: 135 mmol/L (ref 135–145)

## 2023-08-05 LAB — CBC
HCT: 39.2 % (ref 36.0–46.0)
Hemoglobin: 12.2 g/dL (ref 12.0–15.0)
MCH: 29.4 pg (ref 26.0–34.0)
MCHC: 31.1 g/dL (ref 30.0–36.0)
MCV: 94.5 fL (ref 80.0–100.0)
Platelets: 187 10*3/uL (ref 150–400)
RBC: 4.15 MIL/uL (ref 3.87–5.11)
RDW: 14.6 % (ref 11.5–15.5)
WBC: 7.1 10*3/uL (ref 4.0–10.5)
nRBC: 0 % (ref 0.0–0.2)

## 2023-08-05 MED ORDER — DILTIAZEM HCL ER COATED BEADS 240 MG PO CP24
240.0000 mg | ORAL_CAPSULE | Freq: Every day | ORAL | Status: DC
Start: 2023-08-06 — End: 2023-08-07
  Administered 2023-08-06 – 2023-08-07 (×2): 240 mg via ORAL
  Filled 2023-08-05 (×2): qty 1

## 2023-08-05 NOTE — Plan of Care (Signed)
   Problem: Nutrition: Goal: Adequate nutrition will be maintained Outcome: Progressing

## 2023-08-05 NOTE — Progress Notes (Signed)
 Physical Therapy Treatment Patient Details Name: [name] MRN: [id number] DOB: [birth year] Today's Date: 08/05/2023   History of Present Illness [name] is an 89 y.o. female. HPI: Djuna had a unwitnessed fall at home day before admission. Her daughter states she found her laying on the floor seemingly complaining of left hip pain and unable to get up.  She was brought to the emergency department via EMS and found to have a left hip fracture. Pt now s/p Left hip hemiarthroplasty, anterior approach. Past  medical history significant of dementia, persistent A-fib on Eliquis, bronchiectasis, GERD, hearing loss, PVCs    PT Comments  Pt is progressing toward acute PT goals this session with progression to short distance ambulation and decreased assist required throughout session. Pt required MOD A for bed mobility, and MOD A+2 for transfers. Pt ambulated ~44ft with MOD A and multimodal cuing for sequencing of steps, upright posture, and forward gaze.pt's daughter present throughout and educated on how to assist with AAROM for L hip (ankle pumps, knee flexion, hip ABD/ADD exercises). Pt will benefit from continued skilled PT to increase their independence and maximize safety with mobility.      If plan is discharge home, recommend the following: A lot of help with bathing/dressing/bathroom;Assistance with cooking/housework;Direct supervision/assist for financial management;Assist for transportation;Help with stairs or ramp for entrance;Two people to help with walking and/or transfers   Can travel by private vehicle     No  Equipment Recommendations  Rolling walker (2 wheels)    Recommendations for Other Services       Precautions / Restrictions Precautions Precautions: Fall Recall of Precautions/Restrictions: Impaired (Alzheimer'as dementia) Precaution/Restrictions Comments: monitor HR-tachy Restrictions RLE Weight Bearing Per Provider Order: Weight bearing as tolerated LLE Weight  Bearing Per Provider Order: Weight bearing as tolerated     Mobility  Bed Mobility Overal bed mobility: Needs Assistance Bed Mobility: Supine to Sit     Supine to sit: Mod assist, HOB elevated, Used rails     General bed mobility comments: assist to pivot hips & advance L LE; increased time    Transfers Overall transfer level: Needs assistance Equipment used: Rolling walker (2 wheels) Transfers: Sit to/from Stand, Bed to chair/wheelchair/BSC Sit to Stand: +2 physical assistance, +2 safety/equipment, Mod assist   Step pivot transfers: Mod assist, +2 physical assistance, +2 safety/equipment       General transfer comment: MOD A+2 for power up to stand with repeated cues for hand placmeent, pt repeatedly reaching with B UE on RW for power up. Posterior lean in standing, able to correct with cues. Vocalizing out "left, right" with steps.    Ambulation/Gait Ambulation/Gait assistance: Mod assist Gait Distance (Feet): 8 Feet Assistive device: Rolling walker (2 wheels) Gait Pattern/deviations: Step-to pattern, Decreased step length - right, Decreased step length - left, Leaning posteriorly, Narrow base of support Gait velocity: decreased     General Gait Details: able to ambulate short distance with MOD A+1 and close chair follow. VCs for sequencing of steps and pt counting steps out loud. Intermittent scissor step leading to increased unsteadiness, cues for upright posture and anterior weight shift for COM over BOS to improve stability. HR elevation up to 179 observed briefly with ambulation, quickly down toward 130's-140's with seated rest. Sitting 120-130s at rest at end of session.   Stairs             Wheelchair Mobility     Tilt Bed    Modified Rankin (Stroke Patients Only)  Balance Overall balance assessment: Needs assistance Sitting-balance support: Single extremity supported, Feet supported Sitting balance-Leahy Scale: Fair     Standing balance  support: Bilateral upper extremity supported, During functional activity, Reliant on assistive device for balance Standing balance-Leahy Scale: Zero                              Communication Communication Communication: Impaired Factors Affecting Communication: Hearing impaired  Cognition Arousal: Alert Behavior During Therapy: WFL for tasks assessed/performed   PT - Cognitive impairments: History of cognitive impairments                         Following commands: Impaired Following commands impaired: Follows one step commands inconsistently    Cueing Cueing Techniques: Verbal cues, Gestural cues, Tactile cues, Visual cues  Exercises Total Joint Exercises Ankle Circles/Pumps: AROM, Both, 20 reps, Seated Heel Slides: Left, 10 reps, Seated, AAROM Hip ABduction/ADduction: AAROM, Left, 10 reps, Seated    General Comments General comments (skin integrity, edema, etc.): HR in high 110's-low 120's at beginning of session      Pertinent Vitals/Pain Pain Assessment Pain Assessment: Faces Faces Pain Scale: Hurts little more Pain Location: L hip Pain Descriptors / Indicators: Sore ("hurts") Pain Intervention(s): Limited activity within patient's tolerance, Monitored during session, Repositioned, Ice applied, Premedicated before session    Home Living                          Prior Function            PT Goals (current goals can now be found in the care plan section) Acute Rehab PT Goals Patient Stated Goal: go to rehab PT Goal Formulation: With family Time For Goal Achievement: 08/16/23 Potential to Achieve Goals: Good Progress towards PT goals: Progressing toward goals    Frequency    Min 3X/week      PT Plan      Co-evaluation              AM-PAC PT "6 Clicks" Mobility   Outcome Measure  Help needed turning from your back to your side while in a flat bed without using bedrails?: A Lot Help needed moving from lying on  your back to sitting on the side of a flat bed without using bedrails?: A Lot Help needed moving to and from a bed to a chair (including a wheelchair)?: Total Help needed standing up from a chair using your arms (e.g., wheelchair or bedside chair)?: Total Help needed to walk in hospital room?: A Lot Help needed climbing 3-5 steps with a railing? : Total 6 Click Score: 9    End of Session Equipment Utilized During Treatment: Gait belt Activity Tolerance: Patient tolerated treatment well Patient left: in chair;with chair alarm set;with call bell/phone within reach;with family/visitor present Nurse Communication: Mobility status;Other (comment) (tachycardia during session) PT Visit Diagnosis: Unsteadiness on feet (R26.81);History of falling (Z91.81);Difficulty in walking, not elsewhere classified (R26.2);Pain;Muscle weakness (generalized) (M62.81) Pain - Right/Left: Left Pain - part of body: Hip     Time: 9604-5409 PT Time Calculation (min) (ACUTE ONLY): 27 min  Charges:    $Therapeutic Activity: 23-37 mins PT General Charges $$ ACUTE PT VISIT: 1 Visit                     Lyman Speller PT, DPT  Acute Rehabilitation Services  Office [phone]  08/05/2023, 4:43 PM

## 2023-08-05 NOTE — Progress Notes (Signed)
  Progress Note   Patient: [name] XWR:[id number] DOB: [birth year] DOA: 07/30/2023     5 DOS: the patient was seen and examined on 08/05/2023 at 11:40A      Brief hospital course: Left hip fracture, resolved delirium, Afib with tachycardia     Assessment and Plan: * Closed fracture of hip, left, initial encounter [facility]) S/p left hemiarthroplasty 3/5 by Dr. Linna Caprice Malnutrition -WBAT - Eliquis for DVT prophylaxis     Dementia Delirium Resolved - Sertraline  Paroxysmal atrial fibrillation (HCC) Rate up again today, asymptomatic - Continue diltaizem, titrate dose - Continue Eliquis          Subjective: More restless and tachcyardic again today.  Patient has no complaints to me, denies dyspnea, chest symptoms.     Physical Exam: BP 110/61 (BP Location: Left Arm)   Pulse (!) 105   Temp 98.6 F (37 C) (Oral)   Resp 17   Ht 5' (1.524 m)   Wt 44 kg   SpO2 95%   BMI 18.94 kg/m   Thin elderly female, lying in bed, pleasant, interactive, hard of hearing Heart rate regular, normal rate, no murmurs, no peripheral edema Respiratory rate normal, lungs clear without rales or wheezes Abdomen soft no tenderness palpation or guarding Makes eye contact, responds to questions, hard of hearing but pleasant, face symmetric, speech fluent, generalized weakness in the upper extremities with symmetric    Data Reviewed: Potassium, creatinine normal CBC normal  Family Communication: Son and daughter at the bedside    Disposition: Status is: Inpatient         Author: Alberteen Sam, MD 08/05/2023 4:27 PM  For on call review [email or web address].

## 2023-08-06 DIAGNOSIS — I4819 Other persistent atrial fibrillation: Secondary | ICD-10-CM | POA: Diagnosis not present

## 2023-08-06 DIAGNOSIS — S72002A Fracture of unspecified part of neck of left femur, initial encounter for closed fracture: Secondary | ICD-10-CM | POA: Diagnosis not present

## 2023-08-06 LAB — TSH: TSH: 3.53 u[IU]/mL (ref 0.350–4.500)

## 2023-08-06 MED ORDER — DIGOXIN 125 MCG PO TABS
0.1250 mg | ORAL_TABLET | Freq: Every day | ORAL | Status: DC
  Administered 2023-08-07: 0.125 mg via ORAL
  Filled 2023-08-06: qty 1

## 2023-08-06 MED ORDER — DIGOXIN 0.25 MG/ML IJ SOLN
0.1250 mg | Freq: Three times a day (TID) | INTRAMUSCULAR | Status: AC
  Administered 2023-08-06 (×2): 0.125 mg via INTRAVENOUS
  Filled 2023-08-06 (×2): qty 0.5

## 2023-08-06 NOTE — Progress Notes (Signed)
 Assisted RN in administering IV digoxin to pt. HR prior 110s-140s. Bp 119/80 (92) o2 95%.  HR 15 minutes after digoxin 110s-140s, bp 116/79 (90), o2 95%.

## 2023-08-06 NOTE — TOC Progression Note (Signed)
 Transition of Care [facility]) - Progression Note    Patient Details  Name: [name] MRN: [id number] Date of Birth: November 06, 1930  Transition of Care [facility]) CM/SW Contact  Amada Jupiter, LCSW Phone Number: 08/06/2023, 3:01 PM  Clinical Narrative:    Met with pt and daughter, Waynetta Sandy, this morning and they are aware that MD anticipates pt will be medically ready for dc tomorrow.  They have accepted a SNF bed at [facility] and have confirmed they can admit tomorrow.  Will follow up with all in the morning.   Expected Discharge Plan: Skilled Nursing Facility Barriers to Discharge: SNF Pending bed offer  Expected Discharge Plan and Services In-house Referral: Clinical Social Work   Post Acute Care Choice: Skilled Nursing Facility Living arrangements for the past 2 months: Single Family Home                 DME Arranged: N/A DME Agency: NA                   Social Determinants of Health (SDOH) Interventions SDOH Screenings   Food Insecurity: No Food Insecurity (07/31/2023)  Housing: Unknown (07/31/2023)  Transportation Needs: No Transportation Needs (07/31/2023)  Utilities: Not At Risk (07/31/2023)  Social Connections: Patient Declined (07/31/2023)  Tobacco Use: Medium Risk (08/01/2023)    Readmission Risk Interventions    08/02/2023    4:19 PM  Readmission Risk Prevention Plan  Post Dischage Appt Complete  Medication Screening Complete  Transportation Screening Complete

## 2023-08-06 NOTE — Progress Notes (Signed)
  Progress Note   Patient: [name]:[id number] DOB: 12-16-1930 DOA: 07/30/2023     6 DOS: the patient was seen and examined on 08/06/2023 at 10:32AM      Brief hospital course: 89 y.o. F with dementia, pAF on Eliquis, lives with family, fell at home, had immediate pain and could not bear weight, and found to have left hip fracture.     Assessment and Plan: * Closed fracture of hip, left, initial encounter [facility]) S/p left hemiarthroplasty 3/5 by Dr. Veda Canning Asymptomatic post-op tachycardia, no anemia.  Doing well with PT. - WBAT - Eliquis for DVT ppx - PT/OT    Protein-calorie malnutrition, severe - Continue nutrition supplements  Dementia Some mild delirium earlier today, now resolved. - Delirium precautions - Continue home sertraline  Paroxysmal atrial fibrillation (HCC) Rates again elevated.  No symptoms of ischemia.  I have titrated up diltiazem somewhat but limited by hypotension. - Consult cardiology - Continue diltiazem - Load digoxin - Continue Eliquis          Subjective: Patient is asymptomatic, appetite okay, pain controlled, no chest pain, dyspnea, palpitations, confusion.     Physical Exam: BP 122/74   Pulse 83   Temp 97.7 F (36.5 C) (Oral)   Resp 18   Ht 5' (1.524 m)   Wt 44 kg   SpO2 96%   BMI 18.94 kg/m   Thin adult female, lying in bed, pleasant and interactive Tachycardic, irregular, no murmurs, no peripheral edema, no JVD Respiratory normal, lungs without rales or wheezes Abdomen soft, no tenderness palpation Dressing is clean dry and intact, attention normal, slightly hard of hearing, pleasant affect, face symmetric, speech fluent, generalized weakness in upper extremities but symmetric  Data Reviewed: Discussed with cardiology  Family Communication: Daughter at the bedside    Disposition: Status is: Inpatient         Author: Alberteen Sam, MD 08/06/2023 3:39 PM  For on call review  [email or web address].

## 2023-08-06 NOTE — Consult Note (Signed)
 Cardiology Consultation   Patient ID: [name] MRN: [id number]; DOB: September 09, 1930  Admit date: 07/30/2023 Date of Consult: 08/06/2023  PCP:  Thana Ates, MD   [facility] Providers Cardiologist:  None  Electrophysiologist:  Will Jorja Loa, MD       Patient Profile:   [name] is a 89 y.o. female with a hx of bronchiectasis, PVCs, persistent atrial fibrillation, dementia who is being seen 08/06/2023 for the evaluation of afib with acute RVR at the request of Dr. Maryfrances Bunnell.  History of Present Illness:   [name] has [facility] with [facility] team, has undergone DCCV in the past (2024) with rapid recurrence of afib. As such, recent strategy has focuses on rate control with Diltiazem 120mg  daily (dose limited by BP).   Patient currently admitted with acute fracture of the proximal left femur after a mechanical fall. She underwent surgical repair on 3/5. During this admission, patient with fluctuating HR but on 3/10, developed more persistent RVR despite increase in Cardizem dosing to 240mg  daily. Cardiology was asked for recommendations on rate control and patient was started on Digoxin.   On exam, patient appears to be in good spirits. Her son is also at bedside. Patient denies focal cardiac symptoms, denies awareness of faster heart rates this morning.    Past Medical History:  Diagnosis Date   A-fib (HCC)    Bronchiectasis (HCC)    with multiple pneumonia back in 1990's   Bronchitis    episodic   DJD (degenerative joint disease) of knee    GERD (gastroesophageal reflux disease)    episodic symptoms   Hearing loss    chronic moderate hearing loss, worked up by ENT and hearing aids have been recommended   History of nuclear stress test 03/29/2006   NORMAL   Insomnia    PVC's (premature ventricular contractions)    on EKG, controlled on atenolol and caffeine reduction   Tinnitus    chronic    Past Surgical History:  Procedure  Laterality Date   ANTERIOR APPROACH HEMI HIP ARTHROPLASTY Left 08/01/2023   Procedure: HEMIARTHROPLASTY, HIP, DIRECT ANTERIOR APPROACH, FOR FRACTURE;  Surgeon: Samson Frederic, MD;  Location: [facility] ORS;  Service: Orthopedics;  Laterality: Left;   BREAST EXCISIONAL BIOPSY Left    CARDIOVERSION N/A 08/31/2015   Procedure: CARDIOVERSION;  Surgeon: Jake Bathe, MD;  Location: [facility] ENDOSCOPY;  Service: Cardiovascular;  Laterality: N/A;   CARDIOVERSION N/A 02/28/2023   Procedure: CARDIOVERSION;  Surgeon: [name], MD;  Location: [facility] INVASIVE CV LAB;  Service: Cardiovascular;  Laterality: N/A;   cyst removed     left breast     Home Medications:  Prior to Admission medications   Medication Sig Start Date End Date Taking? Authorizing Provider  acetaminophen (TYLENOL) 500 MG tablet Take 2 tablets (1,000 mg total) by mouth every 8 (eight) hours. 08/03/23 09/02/23 Yes Swinteck, Arlys John, MD  apixaban (ELIQUIS) 2.5 MG TABS tablet TAKE 1 TABLET(2.5 MG) BY MOUTH TWICE DAILY 06/01/22  Yes Sheilah Pigeon, PA-C  Cholecalciferol (VITAMIN D3) 2000 units TABS Take 2,000 Units by mouth daily. Gummy   Yes [provider]  diltiazem (CARDIZEM CD) 120 MG 24 hr capsule Take 1 capsule (120 mg total) by mouth daily. Patient taking differently: Take 120 mg by mouth at bedtime. 04/23/23 07/31/23 Yes Eustace Pen, PA-C  ipratropium-albuterol (DUONEB) 0.5-2.5 (3) MG/3ML SOLN Take 3 mLs by nebulization every 6 (six) hours as needed. 05/18/23  Yes Cobb,  Ruby Cola, NP  sertraline (ZOLOFT) 50 MG tablet Take 50 mg by mouth daily.   Yes [provider]  traMADol (ULTRAM) 50 MG tablet Take 1 tablet (50 mg total) by mouth every 6 (six) hours as needed. 08/03/23 08/02/24  Samson Frederic, MD    Inpatient Medications: Scheduled Meds:  apixaban  2.5 mg Oral BID   digoxin  0.125 mg Intravenous Q8H   Followed by   Melene Muller ON 08/07/2023] digoxin  0.125 mg Oral Daily   diltiazem  240 mg Oral Daily   docusate sodium   100 mg Oral BID   feeding supplement  237 mL Oral BID BM   senna  1 tablet Oral BID   sertraline  50 mg Oral Daily   Continuous Infusions:  PRN Meds: acetaminophen, acetaminophen, diltiazem, fentaNYL (SUBLIMAZE) injection, HYDROcodone-acetaminophen, HYDROcodone-acetaminophen, levalbuterol, menthol-cetylpyridinium **OR** phenol, methocarbamol **OR** methocarbamol (ROBAXIN) injection, metoCLOPramide **OR** metoCLOPramide (REGLAN) injection, morphine injection, naLOXone (NARCAN)  injection, oxyCODONE, polyethylene glycol  Allergies:    Allergies  Allergen Reactions   Augmentin [Amoxicillin-Pot Clavulanate] Nausea And Vomiting   Biaxin [Clarithromycin] Nausea And Vomiting   Metoprolol Other (See Comments)    Patient notes she "feels funny" after taking it   Tequin [Gatifloxacin] Nausea Only    Social History:   Social History   Socioeconomic History   Marital status: Divorced    Spouse name: Not on file   Number of children: Not on file   Years of education: Not on file   Highest education level: Not on file  Occupational History   Occupation: retired  Tobacco Use   Smoking status: Former    Current packs/day: 0.00    Average packs/day: 0.5 packs/day for 20.0 years (10.0 ttl pk-yrs)    Types: Cigarettes    Start date: 05/29/1948    Quit date: 05/29/1968    Years since quitting: 55.2   Smokeless tobacco: Never  Vaping Use   Vaping status: Never Used  Substance and Sexual Activity   Alcohol use: Yes    Alcohol/week: 0.0 standard drinks of alcohol    Comment: 1 can of beer at night   Drug use: No   Sexual activity: Not on file  Other Topics Concern   Not on file  Social History Narrative   Not on file   Social Drivers of Health   Financial Resource Strain: Not on file  Food Insecurity: No Food Insecurity (07/31/2023)   Hunger Vital Sign    Worried About Running Out of Food in the Last Year: Never true    Ran Out of Food in the Last Year: Never true  Transportation  Needs: No Transportation Needs (07/31/2023)   PRAPARE - Administrator, Civil Service (Medical): No    Lack of Transportation (Non-Medical): No  Physical Activity: Not on file  Stress: Not on file  Social Connections: Patient Declined (07/31/2023)   Social Connection and Isolation Panel [NHANES]    Frequency of Communication with Friends and Family: Patient declined    Frequency of Social Gatherings with Friends and Family: Patient declined    Attends Religious Services: Patient declined    Database administrator or Organizations: Patient declined    Attends Banker Meetings: Patient declined    Marital Status: Patient declined  Intimate Partner Violence: Not At Risk (07/31/2023)   Humiliation, Afraid, Rape, and Kick questionnaire    Fear of Current or Ex-Partner: No    Emotionally Abused: No    Physically Abused: No  Sexually Abused: No    Family History:    Family History  Problem Relation Age of Onset   Stroke Father    Hypertension Father    Stroke Mother    Hypertension Mother    Asthma Sister    Clotting disorder Sister    Heart attack Neg Hx      ROS:  Please see the history of present illness.   All other ROS reviewed and negative.     Physical Exam/Data:   Vitals:   08/06/23 0207 08/06/23 0547 08/06/23 0944 08/06/23 1344  BP: 111/70 113/73 103/70 122/74  Pulse: (!) 104 (!) 105 (!) 45 83  Resp: 16 16 18 18   Temp: 98.2 F (36.8 C) 97.6 F (36.4 C) 97.6 F (36.4 C) 97.7 F (36.5 C)  TempSrc:  Oral Oral Oral  SpO2: 96% 96% 98% 96%  Weight:      Height:        Intake/Output Summary (Last 24 hours) at 08/06/2023 1349 Last data filed at 08/06/2023 1000 Gross per 24 hour  Intake 1020 ml  Output 700 ml  Net 320 ml      08/01/2023    2:57 PM 05/18/2023   11:31 AM 04/23/2023    2:11 PM  Last 3 Weights  Weight (lbs) 97 lb 92 lb 95 lb 12.8 oz  Weight (kg) 43.999 kg 41.731 kg 43.455 kg     Body mass index is 18.94 kg/m.  General:   Well nourished, well developed, in no acute distress HEENT: normal Neck: no JVD Vascular: No carotid bruits; Distal pulses 2+ bilaterally Cardiac:  normal S1, S2; irregularly irregular and rapid; no murmur  Lungs:  bilateral upper lobe rhonchi Abd: soft, nontender, no hepatomegaly  Ext: no edema Musculoskeletal:  No deformities, BUE and BLE strength normal and equal Skin: warm and dry  Neuro:  CNs 2-12 intact, no focal abnormalities noted Psych:  Normal affect   EKG:  The EKG was personally reviewed and demonstrates:  afib with RVR, ventricular rates 140s. Telemetry:  Telemetry was personally reviewed and demonstrates:  atrial fibrillation with RVR, Ventricular rates improved to low 100s at time of exam from 140s earlier today.  Relevant CV Studies:   Laboratory Data:  High Sensitivity Troponin:  No results for input(s): "TROPONINIHS" in the last 720 hours.   Chemistry Recent Labs  Lab 07/31/23 0808 08/01/23 0507 08/02/23 0500 08/03/23 0434 08/05/23 0755  NA  --    < > 135 136 135  K  --    < > 4.4 4.0 3.9  CL  --    < > 99 102 99  CO2  --    < > 25 27 28   GLUCOSE  --    < > 102* 118* 103*  BUN  --    < > 20 22 15   CREATININE  --    < > 0.50 0.63 0.58  CALCIUM  --    < > 8.9 7.9* 8.4*  MG 1.9  --   --  1.7  --   GFRNONAA  --    < > >60 >60 >60  ANIONGAP  --    < > 11 7 8    < > = values in this interval not displayed.    Recent Labs  Lab 07/30/23 2256  PROT 7.8  ALBUMIN 4.1  AST 20  ALT 14  ALKPHOS 85  BILITOT 0.9   Lipids No results for input(s): "CHOL", "TRIG", "HDL", "LABVLDL", "LDLCALC", "CHOLHDL" in  the last 168 hours.  Hematology Recent Labs  Lab 08/02/23 0500 08/03/23 0434 08/05/23 0755  WBC 10.1 7.7 7.1  RBC 4.57 4.12 4.15  HGB 13.4 12.2 12.2  HCT 42.4 38.2 39.2  MCV 92.8 92.7 94.5  MCH 29.3 29.6 29.4  MCHC 31.6 31.9 31.1  RDW 14.3 14.5 14.6  PLT 156 146* 187   Thyroid  Recent Labs  Lab 08/06/23 1207  TSH 3.530    BNPNo results for  input(s): "BNP", "PROBNP" in the last 168 hours.  DDimer No results for input(s): "DDIMER" in the last 168 hours.   Radiology/Studies:  No results found.   Assessment and Plan:   Longstanding persistent atrial fibrillation Secondary hypercoagulable state As note in HPI, longstanding persistent afib with failed cardioversion in 2024. Recent management by afib clinic and EP team has focused on rate control and prior to this admission, patient stable on Cardizem CD 120mg . Now admitted with left femur fracture after a mechanical fall and has been more difficult to rate control, prompting cardiology consult today. Rates into the 140s today despite increase in Cardizem CD to 240mg . Digoxin 0.25mg  IV injection ordered, with rates now improved into the lower 100s. In the acute setting, agree with continue to utilize Digoxin due to low BP limiting rate controlling agents. Could consider backing off Cardizem and adding a beta blocker if BP continues to be an issue. Ultimately I suspect that rates will improve with recovery from acute illness. Continue Eliquis 2.5mg  BID. Will plan to arrange afib clinic follow up prior to d/c. Encouraged more frequent use of incentive spirometer   Risk Assessment/Risk Scores:          CHA2DS2-VASc Score = 3   This indicates a 3.2% annual risk of stroke. The patient's score is based upon: CHF History: 0 HTN History: 0 Diabetes History: 0 Stroke History: 0 Vascular Disease History: 0 Age Score: 2 Gender Score: 1         For questions or updates, please contact [facility] Please consult [email or web address] for contact info under    Signed, Perlie Gold, PA-C  08/06/2023 1:49 PM

## 2023-08-07 ENCOUNTER — Other Ambulatory Visit (HOSPITAL_COMMUNITY): Payer: Self-pay

## 2023-08-07 DIAGNOSIS — H9190 Unspecified hearing loss, unspecified ear: Secondary | ICD-10-CM | POA: Diagnosis not present

## 2023-08-07 DIAGNOSIS — M25552 Pain in left hip: Secondary | ICD-10-CM | POA: Diagnosis not present

## 2023-08-07 DIAGNOSIS — Z79899 Other long term (current) drug therapy: Secondary | ICD-10-CM | POA: Diagnosis not present

## 2023-08-07 DIAGNOSIS — S72002D Fracture of unspecified part of neck of left femur, subsequent encounter for closed fracture with routine healing: Secondary | ICD-10-CM | POA: Diagnosis not present

## 2023-08-07 DIAGNOSIS — S72002A Fracture of unspecified part of neck of left femur, initial encounter for closed fracture: Secondary | ICD-10-CM | POA: Diagnosis not present

## 2023-08-07 DIAGNOSIS — I4821 Permanent atrial fibrillation: Secondary | ICD-10-CM | POA: Diagnosis not present

## 2023-08-07 DIAGNOSIS — Z7901 Long term (current) use of anticoagulants: Secondary | ICD-10-CM | POA: Diagnosis not present

## 2023-08-07 DIAGNOSIS — W19XXXD Unspecified fall, subsequent encounter: Secondary | ICD-10-CM | POA: Diagnosis not present

## 2023-08-07 DIAGNOSIS — Z7401 Bed confinement status: Secondary | ICD-10-CM | POA: Diagnosis not present

## 2023-08-07 DIAGNOSIS — E46 Unspecified protein-calorie malnutrition: Secondary | ICD-10-CM | POA: Diagnosis not present

## 2023-08-07 DIAGNOSIS — H9193 Unspecified hearing loss, bilateral: Secondary | ICD-10-CM | POA: Diagnosis not present

## 2023-08-07 DIAGNOSIS — L89152 Pressure ulcer of sacral region, stage 2: Secondary | ICD-10-CM | POA: Diagnosis not present

## 2023-08-07 DIAGNOSIS — L89156 Pressure-induced deep tissue damage of sacral region: Secondary | ICD-10-CM | POA: Diagnosis not present

## 2023-08-07 DIAGNOSIS — S7290XA Unspecified fracture of unspecified femur, initial encounter for closed fracture: Secondary | ICD-10-CM | POA: Diagnosis not present

## 2023-08-07 DIAGNOSIS — Z96642 Presence of left artificial hip joint: Secondary | ICD-10-CM | POA: Diagnosis not present

## 2023-08-07 DIAGNOSIS — Z9181 History of falling: Secondary | ICD-10-CM | POA: Diagnosis not present

## 2023-08-07 DIAGNOSIS — I493 Ventricular premature depolarization: Secondary | ICD-10-CM | POA: Diagnosis not present

## 2023-08-07 DIAGNOSIS — J479 Bronchiectasis, uncomplicated: Secondary | ICD-10-CM | POA: Diagnosis not present

## 2023-08-07 DIAGNOSIS — E43 Unspecified severe protein-calorie malnutrition: Secondary | ICD-10-CM | POA: Diagnosis not present

## 2023-08-07 DIAGNOSIS — K219 Gastro-esophageal reflux disease without esophagitis: Secondary | ICD-10-CM | POA: Diagnosis not present

## 2023-08-07 DIAGNOSIS — I480 Paroxysmal atrial fibrillation: Secondary | ICD-10-CM | POA: Diagnosis not present

## 2023-08-07 DIAGNOSIS — F02B0 Dementia in other diseases classified elsewhere, moderate, without behavioral disturbance, psychotic disturbance, mood disturbance, and anxiety: Secondary | ICD-10-CM | POA: Diagnosis not present

## 2023-08-07 DIAGNOSIS — K5900 Constipation, unspecified: Secondary | ICD-10-CM | POA: Diagnosis not present

## 2023-08-07 DIAGNOSIS — Z66 Do not resuscitate: Secondary | ICD-10-CM | POA: Diagnosis not present

## 2023-08-07 DIAGNOSIS — J449 Chronic obstructive pulmonary disease, unspecified: Secondary | ICD-10-CM | POA: Diagnosis not present

## 2023-08-07 DIAGNOSIS — E064 Drug-induced thyroiditis: Secondary | ICD-10-CM | POA: Diagnosis not present

## 2023-08-07 DIAGNOSIS — S72042D Displaced fracture of base of neck of left femur, subsequent encounter for closed fracture with routine healing: Secondary | ICD-10-CM | POA: Diagnosis not present

## 2023-08-07 DIAGNOSIS — R4182 Altered mental status, unspecified: Secondary | ICD-10-CM | POA: Diagnosis not present

## 2023-08-07 DIAGNOSIS — I4819 Other persistent atrial fibrillation: Secondary | ICD-10-CM | POA: Diagnosis not present

## 2023-08-07 DIAGNOSIS — Z471 Aftercare following joint replacement surgery: Secondary | ICD-10-CM | POA: Diagnosis not present

## 2023-08-07 DIAGNOSIS — F03918 Unspecified dementia, unspecified severity, with other behavioral disturbance: Secondary | ICD-10-CM | POA: Diagnosis not present

## 2023-08-07 DIAGNOSIS — D6869 Other thrombophilia: Secondary | ICD-10-CM | POA: Diagnosis not present

## 2023-08-07 DIAGNOSIS — H9319 Tinnitus, unspecified ear: Secondary | ICD-10-CM | POA: Diagnosis not present

## 2023-08-07 DIAGNOSIS — F32A Depression, unspecified: Secondary | ICD-10-CM | POA: Diagnosis not present

## 2023-08-07 DIAGNOSIS — R531 Weakness: Secondary | ICD-10-CM | POA: Diagnosis not present

## 2023-08-07 DIAGNOSIS — H918X3 Other specified hearing loss, bilateral: Secondary | ICD-10-CM | POA: Diagnosis not present

## 2023-08-07 DIAGNOSIS — F0390 Unspecified dementia without behavioral disturbance: Secondary | ICD-10-CM | POA: Diagnosis not present

## 2023-08-07 DIAGNOSIS — I10 Essential (primary) hypertension: Secondary | ICD-10-CM | POA: Diagnosis not present

## 2023-08-07 DIAGNOSIS — R0602 Shortness of breath: Secondary | ICD-10-CM | POA: Diagnosis not present

## 2023-08-07 MED ORDER — DOCUSATE SODIUM 100 MG PO CAPS
100.0000 mg | ORAL_CAPSULE | Freq: Two times a day (BID) | ORAL | 0 refills | Status: DC
  Filled 2023-08-07: qty 10, 5d supply, fill #0

## 2023-08-07 MED ORDER — DIGOXIN 125 MCG PO TABS: 0.1250 mg | ORAL_TABLET | Freq: Every day | ORAL | Status: DC

## 2023-08-07 MED ORDER — ENSURE ENLIVE PO LIQD
237.0000 mL | Freq: Two times a day (BID) | ORAL | 12 refills | Status: DC
  Filled 2023-08-07: qty 237, 1d supply, fill #0

## 2023-08-07 MED ORDER — DILTIAZEM HCL ER COATED BEADS 240 MG PO CP24: 240.0000 mg | ORAL_CAPSULE | Freq: Every day | ORAL | Status: DC

## 2023-08-07 NOTE — Discharge Summary (Signed)
 Physician Discharge Summary   Patient: [name] MRN: [id number] DOB: 13-Oct-1930  Admit date:     07/30/2023  Discharge date: 08/07/23  Discharge Physician: Alberteen Sam   PCP: Thana Ates, MD     Recommendations at discharge:  Follow up with Dr. Veda Canning for hip fracture in 1-2 weeks Follow up with Afib clinic on Mar 17 Dr. Kevan Ny: Please check digoxin level on Mar 16 and adjust as appropriate  [facility]: Please check BP and HR at least daily for at least 1 week and relay to Dr. Kevan Ny for adjustment of diltiazem if needed     Discharge Diagnoses: Principal Problem:   Closed fracture of hip, left, initial encounter [facility]) Active Problems:   Paroxysmal atrial fibrillation (HCC)   Dementia   Protein-calorie malnutrition, severe      Hospital Course: 89 y.o. F with dementia, pAF on Eliquis, lives with family, fell at home, had immediate pain and could not bear weight, and found to have left hip fracture.      * Closed fracture of hip, left, initial encounter [facility]) S/p left hemiarthroplasty 3/5 by Dr. Veda Canning Admitted and underwent left hemiarthroplasty.  Post-op had tachcyardia, see below.  Asymptomatic and working with with PT.   - WBAT - On PTA Eliquis which will suffice for DVT ppx - Maintain dressing until instructed by Dr. Veda Canning at office follow up - Acetaminophen or Tramadol for pain - Continue protein supplements    Dementia Delirium Had mild transient delirium in the hospital.  Paroxysmal atrial fibrillation (HCC) Rates elevated post-op, diltiazem titrated up without success, limited by soft BP.  TSH normal.  Cardiology consulted, digoxin loaded.  While digoxin is not an optimal strategy for an 89 year frail patient with cognitive impairment, we have no other optimal strategy for rate control, and she has failed cardioversion before.   She has Afib clinic follow up pending in 1 week.  Digoxin can be revisited at that time.  Needs to be  followed closely with dig levels and monitoring for symptoms of dig toxicity.  Digoxin 0.125 mg daily, eliquis 2.5 mg BID, cardizem 240 mg daily.           The [facility] Controlled Substances Registry was reviewed for this patient prior to discharge.  Consultants: Cardiology Orthopedics  Procedures performed:  Left hemiarthroplasty    Disposition: Skilled nursing facility Diet recommendation:  Discharge Diet Orders (From admission, onward)     Start     Ordered   08/07/23 0000  Diet - low sodium heart healthy        08/07/23 1211             DISCHARGE MEDICATION: Allergies as of 08/07/2023       Reactions   Augmentin [amoxicillin-pot Clavulanate] Nausea And Vomiting   Biaxin [clarithromycin] Nausea And Vomiting   Metoprolol Other (See Comments)   Patient notes she "feels funny" after taking it   Tequin [gatifloxacin] Nausea Only        Medication List     TAKE these medications    acetaminophen 500 MG tablet Commonly known as: TYLENOL Take 2 tablets (1,000 mg total) by mouth every 8 (eight) hours.   apixaban 2.5 MG Tabs tablet Commonly known as: Eliquis TAKE 1 TABLET(2.5 MG) BY MOUTH TWICE DAILY   digoxin 0.125 MG tablet Commonly known as: LANOXIN Take 1 tablet (0.125 mg total) by mouth daily. Start taking on: August 08, 2023   diltiazem 240 MG 24 hr  capsule Commonly known as: CARDIZEM CD Take 1 capsule (240 mg total) by mouth daily. Start taking on: August 08, 2023 What changed:  medication strength how much to take   docusate sodium 100 MG capsule Commonly known as: COLACE Take 1 capsule (100 mg total) by mouth 2 (two) times daily.   feeding supplement Liqd Take 237 mLs by mouth 2 (two) times daily between meals.   ipratropium-albuterol 0.5-2.5 (3) MG/3ML Soln Commonly known as: DUONEB Take 3 mLs by nebulization every 6 (six) hours as needed.   sertraline 50 MG tablet Commonly known as: ZOLOFT Take 50 mg by mouth daily.    traMADol 50 MG tablet Commonly known as: Ultram Take 1 tablet (50 mg total) by mouth every 6 (six) hours as needed.   Vitamin D3 50 MCG (2000 UT) Tabs Take 2,000 Units by mouth daily. Gummy        Follow-up Information     Samson Frederic, MD Follow up.   Specialty: Orthopedic Surgery Contact information: [address] [place] [address] [phone]                 Discharge Instructions     Diet - low sodium heart healthy   Complete by: As directed    Discharge instructions   Complete by: As directed    **IMPORTANT DISCHARGE INSTRUCTIONS**   From Dr. Maryfrances Bunnell: You were admitted for hip fracture  You had a left hemiarthroplasty by Dr. Veda Canning  Call Dr. Jovita Gamma office this week to arrange a follow up appointment, let them tell you what the timing of that appoitnment should be  Leave the dressing in place until that appointment  For pain, take acetaminophen and/or tramadol (tramadol is a semi-opiate)  You also had faster heart rates This will likely settle down spontaneously in the next few weeks  For now:  INCREASE diltiazem dose  Ask Dr. Kevan Ny if you need a prescription for this when you discharge from [facility]  ADD the medicine digoxin 0.125 mg daily Check a digoxin level in 5-7 days (Sun to Tues next week)  Go see the Afib clinic on Mar 17  Continue your Eliquis   Increase activity slowly   Complete by: As directed        Discharge Exam: Filed Weights   08/01/23 1457  Weight: 44 kg    General: Pt is alert, awake, not in acute distress, sitting up in recliner Cardiovascular: Controlled rate, irregular, nl S1-S2, no murmurs appreciated.   No LE edema.   Respiratory: Normal respiratory rate and rhythm.  CTAB without rales or wheezes. Abdominal: Abdomen soft and non-tender.  No distension or HSM.   Neuro/Psych: Strength symmetric in upper extremities.  Judgment and insight appear impaired but at baseline, pleasant  affect.   Condition at discharge: fair  The results of significant diagnostics from this hospitalization (including imaging, microbiology, ancillary and laboratory) are listed below for reference.   Imaging Studies: DG Pelvis Portable Result Date: 08/01/2023 CLINICAL DATA:  Postop EXAM: PORTABLE PELVIS 1-2 VIEWS COMPARISON:  Preoperative imaging FINDINGS: Left hip hemiarthroplasty in expected alignment. No periprosthetic lucency or fracture. Recent postsurgical change includes air and edema in the soft tissues. Lateral skin staples in place. IMPRESSION: Left hip hemiarthroplasty without immediate postoperative complication. Electronically Signed   By: Narda Rutherford M.D.   On: 08/01/2023 22:12   DG HIP UNILAT WITH PELVIS 1V LEFT Result Date: 08/01/2023 CLINICAL DATA:  Elective surgery. EXAM: DG HIP (WITH OR WITHOUT PELVIS) 1V*L*  COMPARISON:  Preoperative radiograph FINDINGS: Seven fluoroscopic spot views of the pelvis and left hip obtained in the operating room. Sequential images during hip arthroplasty. Fluoroscopy time 4 seconds. Dose 0.4223 mGy. IMPRESSION: Intraoperative fluoroscopy during left hip arthroplasty. Electronically Signed   By: Narda Rutherford M.D.   On: 08/01/2023 22:10   DG C-Arm 1-60 Min-No Report Result Date: 08/01/2023 Fluoroscopy was utilized by the requesting physician.  No radiographic interpretation.   DG Knee Left Port Result Date: 08/01/2023 CLINICAL DATA:  Closed displaced fracture of left femoral neck. EXAM: PORTABLE LEFT KNEE - 1-2 VIEW COMPARISON:  None Available. FINDINGS: Normal alignment and joint spaces. No significant degenerative change. No erosion or focal bone abnormality. No joint effusion. The bones are subjectively under mineralized. No focal soft tissue abnormalities. IMPRESSION: 1. No fracture or subluxation of the left knee. 2. Subjective osteopenia. Electronically Signed   By: Narda Rutherford M.D.   On: 08/01/2023 09:57   CT Head Wo Contrast Result  Date: 07/31/2023 CLINICAL DATA:  Head trauma, minor (Age >= 65y); Neck trauma (Age >= 65y) EXAM: CT HEAD WITHOUT CONTRAST CT CERVICAL SPINE WITHOUT CONTRAST TECHNIQUE: Multidetector CT imaging of the head and cervical spine was performed following the standard protocol without intravenous contrast. Multiplanar CT image reconstructions of the cervical spine were also generated. RADIATION DOSE REDUCTION: This exam was performed according to the departmental dose-optimization program which includes automated exposure control, adjustment of the mA and/or kV according to patient size and/or use of iterative reconstruction technique. COMPARISON:  CT head 06/26/2022. FINDINGS: CT HEAD FINDINGS Brain: No evidence of acute infarction, hemorrhage, hydrocephalus, extra-axial collection or mass lesion/mass effect. Vascular: Calcific atherosclerosis Skull: No acute fracture. Sinuses/Orbits: Clear sinuses.  No acute orbital findings. Other: No mastoid effusions. CT CERVICAL SPINE FINDINGS Alignment: Mild anterolisthesis of C4 on C5, likely degenerative. Skull base and vertebrae: No acute fracture. Soft tissues and spinal canal: No prevertebral fluid or swelling. No visible canal hematoma. Disc levels:  Moderate multilevel degenerative change. Upper chest: Visualized lung apices are clear. Biapical pleuroparenchymal scarring. IMPRESSION: No acute intracranial or cervical spine finding. Electronically Signed   By: Feliberto Harts M.D.   On: 07/31/2023 01:40   CT Cervical Spine Wo Contrast Result Date: 07/31/2023 CLINICAL DATA:  Head trauma, minor (Age >= 65y); Neck trauma (Age >= 65y) EXAM: CT HEAD WITHOUT CONTRAST CT CERVICAL SPINE WITHOUT CONTRAST TECHNIQUE: Multidetector CT imaging of the head and cervical spine was performed following the standard protocol without intravenous contrast. Multiplanar CT image reconstructions of the cervical spine were also generated. RADIATION DOSE REDUCTION: This exam was performed according  to the departmental dose-optimization program which includes automated exposure control, adjustment of the mA and/or kV according to patient size and/or use of iterative reconstruction technique. COMPARISON:  CT head 06/26/2022. FINDINGS: CT HEAD FINDINGS Brain: No evidence of acute infarction, hemorrhage, hydrocephalus, extra-axial collection or mass lesion/mass effect. Vascular: Calcific atherosclerosis Skull: No acute fracture. Sinuses/Orbits: Clear sinuses.  No acute orbital findings. Other: No mastoid effusions. CT CERVICAL SPINE FINDINGS Alignment: Mild anterolisthesis of C4 on C5, likely degenerative. Skull base and vertebrae: No acute fracture. Soft tissues and spinal canal: No prevertebral fluid or swelling. No visible canal hematoma. Disc levels:  Moderate multilevel degenerative change. Upper chest: Visualized lung apices are clear. Biapical pleuroparenchymal scarring. IMPRESSION: No acute intracranial or cervical spine finding. Electronically Signed   By: Feliberto Harts M.D.   On: 07/31/2023 01:40   DG Chest Port 1 View Result Date: 07/31/2023 CLINICAL DATA:  Status post fall. EXAM: PORTABLE CHEST 1 VIEW COMPARISON:  None Available. FINDINGS: The heart size and mediastinal contours are within normal limits. Stable chronic appearing increased interstitial lung markings are seen. No acute infiltrate, pleural effusion or pneumothorax is identified. There is no evidence of acute osseous abnormality. IMPRESSION: Chronic appearing interstitial lung markings without acute or active cardiopulmonary disease. Electronically Signed   By: Aram Candela M.D.   On: 07/31/2023 01:20   DG Hip Unilat W or Wo Pelvis 2-3 Views Left Result Date: 07/31/2023 CLINICAL DATA:  Status post fall with subsequent left hip pain. EXAM: DG HIP (WITH OR WITHOUT PELVIS) 2-3V LEFT COMPARISON:  None Available. FINDINGS: There is an acute, impacted fracture deformity is seen involving the head and neck of the proximal left femur.  There is no evidence of dislocation. Degenerative changes are seen in the form of joint space narrowing and acetabular sclerosis. IMPRESSION: Acute fracture of the proximal left femur. Electronically Signed   By: Aram Candela M.D.   On: 07/31/2023 01:18    Microbiology: Results for orders placed or performed during the hospital encounter of 07/30/23  Surgical PCR screen     Status: None   Collection Time: 08/01/23  6:00 AM   Specimen: Nasal Mucosa; Nasal Swab  Result Value Ref Range Status   MRSA, PCR NEGATIVE NEGATIVE Final   Staphylococcus aureus NEGATIVE NEGATIVE Final    Comment: (NOTE) The Xpert SA Assay (FDA approved for NASAL specimens in patients 20 years of age and older), is one component of a comprehensive surveillance program. It is not intended to diagnose infection nor to guide or monitor treatment. Performed at [facility], [address] [place], [place] [address]     Labs: CBC: Recent Labs  Lab 08/01/23 0507 08/02/23 0500 08/03/23 0434 08/05/23 0755  WBC 5.7 10.1 7.7 7.1  HGB 13.0 13.4 12.2 12.2  HCT 42.1 42.4 38.2 39.2  MCV 92.9 92.8 92.7 94.5  PLT 151 156 146* 187   Basic Metabolic Panel: Recent Labs  Lab 08/01/23 0507 08/02/23 0500 08/03/23 0434 08/05/23 0755  NA 136 135 136 135  K 3.7 4.4 4.0 3.9  CL 102 99 102 99  CO2 24 25 27 28   GLUCOSE 83 102* 118* 103*  BUN 15 20 22 15   CREATININE 0.53 0.50 0.63 0.58  CALCIUM 8.5* 8.9 7.9* 8.4*  MG  --   --  1.7  --   PHOS  --   --  1.9*  --    Liver Function Tests: No results for input(s): "AST", "ALT", "ALKPHOS", "BILITOT", "PROT", "ALBUMIN" in the last 168 hours. CBG: No results for input(s): "GLUCAP" in the last 168 hours.  Discharge time spent: approximately 25 minutes spent on discharge counseling, evaluation of patient on day of discharge, and coordination of discharge planning with nursing, social work, pharmacy and case management  Signed: Alberteen Sam,  MD [facility] 08/07/2023

## 2023-08-07 NOTE — TOC Transition Note (Signed)
 Transition of Care [facility]) - Discharge Note   Patient Details  Name: [name] MRN: [id number] Date of Birth: 11-Jun-1930  [facility] CM/SW Contact:  Amada Jupiter, LCSW Phone Number: 08/07/2023, 1:04 PM   Clinical Narrative:     Pt medically cleared for dc to SNF today and bed accepted/ ready at [facility] [place].  Pt and family aware and agreeable.  PTAR called at 12:50pm.  RN to call report to [phone].  No further TOC needs.  Final next level of care: Skilled Nursing Facility Barriers to Discharge: Barriers Resolved   Patient Goals and CMS Choice Patient states their goals for this hospitalization and ongoing recovery are:: return home after rehab   Choice offered to / list presented to : Adult Children [place] ownership interest in [facility].provided to:: Adult Children    Discharge Placement PASRR number recieved: 08/02/23            Patient chooses bed at: [facility] [place] Patient to be transferred to facility by: PTAR Name of family member notified: daughter, Waynetta Sandy Patient and family notified of of transfer: 08/07/23  Discharge Plan and Services Additional resources added to the After Visit Summary for   In-house Referral: Clinical Social Work   Post Acute Care Choice: Skilled Nursing Facility          DME Arranged: N/A DME Agency: NA                  Social Drivers of Health (SDOH) Interventions SDOH Screenings   Food Insecurity: No Food Insecurity (07/31/2023)  Housing: Unknown (07/31/2023)  Transportation Needs: No Transportation Needs (07/31/2023)  Utilities: Not At Risk (07/31/2023)  Social Connections: Patient Declined (07/31/2023)  Tobacco Use: Medium Risk (08/01/2023)     Readmission Risk Interventions    08/02/2023    4:19 PM  Readmission Risk Prevention Plan  Post Dischage Appt Complete  Medication Screening Complete  Transportation Screening Complete

## 2023-08-07 NOTE — Progress Notes (Addendum)
 Occupational Therapy Treatment Patient Details Name: [name] MRN: [id number] DOB: 02-11-1931 Today's Date: 08/07/2023   History of present illness [name] is an 89 y.o. female. HPI: [name] had a unwitnessed fall at home day before admission. Her daughter states she found her laying on the floor seemingly complaining of left hip pain and unable to get up.  She was brought to the emergency department via EMS and found to have a left hip fracture. Pt now s/p Left hip hemiarthroplasty, anterior approach. Past  medical history significant of dementia, persistent A-fib on Eliquis, bronchiectasis, GERD, hearing loss, PVCs   OT comments  Pt progressing towards OT goals this session. Mod A +2 for bed mobility and transfers with assist to manage RW. Pt then engaged in seated grooming and UB bathing with cues for initiation and sequencing and mod A to wash back. Pt very pleasant and engaged throughout session. HR remained stable throughout session initially 80's in supine, and with activity got as high as 104 - and returned to 80's with seated activity. OT will continue to follow acutely and POC remains appropriate.       If plan is discharge home, recommend the following:  Two people to help with walking and/or transfers;A little help with walking and/or transfers;Supervision due to cognitive status   Equipment Recommendations   (TBD at rehab facility)    Recommendations for Other Services      Precautions / Restrictions Precautions Precautions: Fall Recall of Precautions/Restrictions: Impaired (Alzheimer'as dementia) Precaution/Restrictions Comments: monitor HR-tachy Restrictions Weight Bearing Restrictions Per Provider Order: No RLE Weight Bearing Per Provider Order: Weight bearing as tolerated LLE Weight Bearing Per Provider Order: Weight bearing as tolerated       Mobility Bed Mobility Overal bed mobility: Needs Assistance Bed Mobility: Supine to Sit     Supine to sit: Mod  assist, +2 for physical assistance, +2 for safety/equipment Sit to supine: +2 for physical assistance, +2 for safety/equipment, Total assist   General bed mobility comments: assist to pivot hips & advance L LE; increased time    Transfers Overall transfer level: Needs assistance Equipment used: Rolling walker (2 wheels) Transfers: Sit to/from Stand Sit to Stand: +2 physical assistance, +2 safety/equipment, Mod assist     Step pivot transfers: Mod assist, +2 physical assistance, +2 safety/equipment     General transfer comment: MOD A+2 for power up to stand with repeated cues for hand placmeent. Posterior lean in standing,     Balance Overall balance assessment: Needs assistance Sitting-balance support: Single extremity supported, Feet supported Sitting balance-Leahy Scale: Fair Sitting balance - Comments: mod A for posterior lean Postural control: Posterior lean Standing balance support: Bilateral upper extremity supported, During functional activity, Reliant on assistive device for balance Standing balance-Leahy Scale: Zero                             ADL either performed or assessed with clinical judgement   ADL Overall ADL's : Needs assistance/impaired     Grooming: Sitting;Cueing for sequencing;Oral care;Wash/dry face;Set up Grooming Details (indicate cue type and reason): in recliner Upper Body Bathing: Sitting;Moderate assistance Upper Body Bathing Details (indicate cue type and reason): for back     Upper Body Dressing : Cueing for sequencing;Minimal assistance;Sitting Upper Body Dressing Details (indicate cue type and reason): donning extra gown like bathrobe     Toilet Transfer: +2 for physical assistance;Rolling walker (2 wheels);Moderate assistance Toilet Transfer Details (indicate  cue type and reason): simulated through recliner transfer         Functional mobility during ADLs: Rolling walker (2 wheels);Cueing for sequencing;Cueing for  safety;+2 for physical assistance;Moderate assistance      Extremity/Trunk Assessment Upper Extremity Assessment Upper Extremity Assessment: Overall WFL for tasks assessed   Lower Extremity Assessment Lower Extremity Assessment: Defer to PT evaluation        Vision   Vision Assessment?: No apparent visual deficits Additional Comments: wears glasses all the time   Perception     Praxis     Communication Communication Communication: Impaired Factors Affecting Communication: Hearing impaired   Cognition Arousal: Alert Behavior During Therapy: WFL for tasks assessed/performed Cognition: History of cognitive impairments             OT - Cognition Comments: pleasant and cooperative h/o Alzheimer's dementia.                 Following commands: Impaired Following commands impaired: Follows one step commands inconsistently      Cueing   Cueing Techniques: Verbal cues, Gestural cues, Tactile cues, Visual cues  Exercises      Shoulder Instructions       General Comments Hr initially in the 70's highest witnessed was 104 with short ambulation    Pertinent Vitals/ Pain       Pain Assessment Pain Assessment: Faces Faces Pain Scale: Hurts even more Pain Location: L hip with movement Pain Descriptors / Indicators: Grimacing, Guarding, Operative site guarding Pain Intervention(s): Monitored during session, Repositioned  Home Living                                          Prior Functioning/Environment              Frequency  Min 1X/week        Progress Toward Goals  OT Goals(current goals can now be found in the care plan section)  Progress towards OT goals: Progressing toward goals  Acute Rehab OT Goals Patient Stated Goal: get to SNF so she can get stronger and get back to boots (her cat) OT Goal Formulation: With family Time For Goal Achievement: 08/16/23 Potential to Achieve Goals: Fair (due to dementia) ADL Goals Pt  Will Perform Lower Body Dressing: with min assist;sitting/lateral leans;sit to/from stand (Improve standing balance to fair static to assist caregivers with donning/doffing clothing over hips and with LE bathing.) Pt Will Transfer to Toilet: ambulating;with supervision;bedside commode;regular height toilet Pt Will Perform Toileting - Clothing Manipulation and hygiene: with min assist;sitting/lateral leans;sit to/from stand Pt/caregiver will Perform Home Exercise Program: Increased strength;Both right and left upper extremity;With Supervision (tactile/visual/verbal cues) Additional ADL Goal #1: Patient's family will identify at least 3 fall prevention strategies to setup at pt's home in order to maximize function and safety during ADLs and decrease caregiver burden while preventing possible injury and rehospitalization.  Plan      Co-evaluation    PT/OT/SLP Co-Evaluation/Treatment: Yes Reason for Co-Treatment: To address functional/ADL transfers;For patient/therapist safety PT goals addressed during session: Mobility/safety with mobility OT goals addressed during session: ADL's and self-care      AM-PAC OT "6 Clicks" Daily Activity     Outcome Measure   Help from another person eating meals?: A Little Help from another person taking care of personal grooming?: A Little Help from another person toileting, which includes using toliet, bedpan, or urinal?:  A Lot Help from another person bathing (including washing, rinsing, drying)?: A Lot Help from another person to put on and taking off regular upper body clothing?: A Little Help from another person to put on and taking off regular lower body clothing?: A Lot 6 Click Score: 15    End of Session Equipment Utilized During Treatment: Gait belt;Rolling walker (2 wheels)  OT Visit Diagnosis: Unsteadiness on feet (R26.81);Pain;Repeated falls (R29.6);History of falling (Z91.81);Other symptoms and signs involving cognitive function Pain -  Right/Left: Left Pain - part of body: Hip   Activity Tolerance Patient tolerated treatment well;Patient limited by pain   Patient Left in chair;with call bell/phone within reach;with chair alarm set;with family/visitor present   Nurse Communication Mobility status;Precautions        Time: 8657-8469 OT Time Calculation (min): 23 min  Charges: OT General Charges $OT Visit: 1 Visit OT Treatments $Self Care/Home Management : 8-22 mins  Nyoka Cowden OTR/L Acute Rehabilitation Services Office: [phone]  Evern Bio [facility] 08/07/2023, 12:33 PM

## 2023-08-07 NOTE — Care Management Important Message (Signed)
 Important Message  Patient Details IM Letter given. Name: [name] MRN: [id number] Date of Birth: 01-15-1931   Important Message Given:  Yes - Medicare IM     Caren Macadam 08/07/2023, 1:09 PM

## 2023-08-07 NOTE — Progress Notes (Signed)
 Physical Therapy Treatment Patient Details Name: [name] MRN: [id number] DOB: 02-01-1931 Today's Date: 08/07/2023   History of Present Illness [name] is an 89 y.o. female. HPI: Rickelle had a unwitnessed fall at home day before admission. Her daughter states she found her laying on the floor seemingly complaining of left hip pain and unable to get up.  She was brought to the emergency department via EMS and found to have a left hip fracture. Pt now s/p Left hip hemiarthroplasty, anterior approach. Past  medical history significant of dementia, persistent A-fib on Eliquis, bronchiectasis, GERD, hearing loss, PVCs    PT Comments  +2 mod assist for supine to sit and sit to stand. Posterior lean in standing requiring mod A to correct. Pt able to take several steps forward, then pivot to recliner with RW and max verbal cues for sequencing/posture. Pt performed L hip AAROM exercises with assistance. Pt puts forth good effort. HR 90s with activity.     If plan is discharge home, recommend the following: A lot of help with bathing/dressing/bathroom;Assistance with cooking/housework;Direct supervision/assist for financial management;Assist for transportation;Help with stairs or ramp for entrance;Two people to help with walking and/or transfers   Can travel by private vehicle     No  Equipment Recommendations  Rolling walker (2 wheels)    Recommendations for Other Services       Precautions / Restrictions Precautions Precautions: Fall Recall of Precautions/Restrictions: Impaired (Alzheimer'as dementia) Precaution/Restrictions Comments: monitor HR-tachy Restrictions RLE Weight Bearing Per Provider Order: Weight bearing as tolerated LLE Weight Bearing Per Provider Order: Weight bearing as tolerated     Mobility  Bed Mobility Overal bed mobility: Needs Assistance Bed Mobility: Supine to Sit     Supine to sit: Mod assist, +2 for physical assistance, +2 for safety/equipment      General bed mobility comments: assist to pivot hips & advance L LE; increased time    Transfers Overall transfer level: Needs assistance Equipment used: Rolling walker (2 wheels) Transfers: Sit to/from Stand Sit to Stand: +2 physical assistance, +2 safety/equipment, Mod assist   Step pivot transfers: Mod assist, +2 physical assistance, +2 safety/equipment       General transfer comment: MOD A+2 for power up to stand with repeated cues for hand placmeent,. Posterior lean in standing,    Ambulation/Gait Ambulation/Gait assistance: Mod assist, +2 physical assistance, +2 safety/equipment Gait Distance (Feet): 4 Feet Assistive device: Rolling walker (2 wheels) Gait Pattern/deviations: Step-to pattern, Decreased step length - right, Decreased step length - left, Leaning posteriorly, Narrow base of support Gait velocity: decreased     General Gait Details: max verbal cues for sequencing, narrow BOS with some scissoring, HR 90s with activity   Stairs             Wheelchair Mobility     Tilt Bed    Modified Rankin (Stroke Patients Only)       Balance Overall balance assessment: Needs assistance Sitting-balance support: Single extremity supported, Feet supported Sitting balance-Leahy Scale: Fair     Standing balance support: Bilateral upper extremity supported, During functional activity, Reliant on assistive device for balance Standing balance-Leahy Scale: Zero                              Communication Communication Communication: Impaired Factors Affecting Communication: Hearing impaired  Cognition Arousal: Alert Behavior During Therapy: WFL for tasks assessed/performed   PT - Cognitive impairments: History of cognitive impairments  Following commands: Impaired Following commands impaired: Follows one step commands inconsistently    Cueing Cueing Techniques: Verbal cues, Gestural cues, Tactile cues, Visual  cues  Exercises Total Joint Exercises Ankle Circles/Pumps: AAROM, Both, 20 reps, Supine Short Arc Quad: AAROM, Left, 10 reps, Supine Heel Slides: Left, Seated, AAROM, 15 reps Hip ABduction/ADduction: AAROM, Left, Seated, 15 reps    General Comments        Pertinent Vitals/Pain Pain Assessment Faces Pain Scale: Hurts even more Pain Location: L hip with movement Pain Descriptors / Indicators: Grimacing, Guarding, Operative site guarding Pain Intervention(s): Limited activity within patient's tolerance, Monitored during session, Repositioned, Patient requesting pain meds-RN notified    Home Living                          Prior Function            PT Goals (current goals can now be found in the care plan section) Acute Rehab PT Goals Patient Stated Goal: go to rehab PT Goal Formulation: With family Time For Goal Achievement: 08/16/23 Potential to Achieve Goals: Good Progress towards PT goals: Progressing toward goals    Frequency    Min 3X/week      PT Plan      Co-evaluation PT/OT/SLP Co-Evaluation/Treatment: Yes Reason for Co-Treatment: To address functional/ADL transfers;For patient/therapist safety PT goals addressed during session: Mobility/safety with mobility OT goals addressed during session: ADL's and self-care      AM-PAC PT "6 Clicks" Mobility   Outcome Measure  Help needed turning from your back to your side while in a flat bed without using bedrails?: A Lot Help needed moving from lying on your back to sitting on the side of a flat bed without using bedrails?: A Lot Help needed moving to and from a bed to a chair (including a wheelchair)?: Total Help needed standing up from a chair using your arms (e.g., wheelchair or bedside chair)?: Total Help needed to walk in hospital room?: Total Help needed climbing 3-5 steps with a railing? : Total 6 Click Score: 8    End of Session Equipment Utilized During Treatment: Gait belt Activity  Tolerance: Patient tolerated treatment well;Patient limited by pain Patient left: in chair;with chair alarm set;with call bell/phone within reach Nurse Communication: Mobility status PT Visit Diagnosis: Unsteadiness on feet (R26.81);History of falling (Z91.81);Difficulty in walking, not elsewhere classified (R26.2);Pain;Muscle weakness (generalized) (M62.81) Pain - Right/Left: Left Pain - part of body: Hip     Time: 1610-9604 PT Time Calculation (min) (ACUTE ONLY): 16 min  Charges:    $Therapeutic Activity: 8-22 mins PT General Charges $$ ACUTE PT VISIT: 1 Visit                     Tamala Ser PT 08/07/2023  Acute Rehabilitation Services  Office [phone]

## 2023-08-07 NOTE — Plan of Care (Signed)
 ?  Problem: Clinical Measurements: ?Goal: Will remain free from infection ?Outcome: Progressing ?  ?

## 2023-08-07 NOTE — Progress Notes (Signed)
 Patient being transferred to [facility], report give to recieiving nurse

## 2023-08-07 NOTE — Progress Notes (Addendum)
 Patient Name: [name] Date of Encounter: 08/07/2023 [facility] Cardiologist: None   Interval Summary  .    Patient without focal complaint this morning. Is pending discharge to rehab today.   Vital Signs .    Vitals:   08/06/23 2052 08/07/23 0042 08/07/23 0526 08/07/23 0914  BP: 126/76 116/75 106/70 116/71  Pulse: (!) 114 (!) 112 92 97  Resp: 20 20 20 18   Temp: 98 F (36.7 C) (!) 97.5 F (36.4 C) (!) 97.4 F (36.3 C)   TempSrc: Oral Oral Oral   SpO2: 98% 95% 97% 97%  Weight:      Height:        Intake/Output Summary (Last 24 hours) at 08/07/2023 1005 Last data filed at 08/07/2023 1000 Gross per 24 hour  Intake 1100 ml  Output 1250 ml  Net -150 ml      08/01/2023    2:57 PM 05/18/2023   11:31 AM 04/23/2023    2:11 PM  Last 3 Weights  Weight (lbs) 97 lb 92 lb 95 lb 12.8 oz  Weight (kg) 43.999 kg 41.731 kg 43.455 kg      Telemetry/ECG    Persistent atrial fibrillation, rates generally well controlled but occasionally peaking into the 140s - Personally Reviewed  Physical Exam .   GEN: No acute distress.   Neck: No JVD Cardiac: irregularly irregular, no murmurs, rubs, or gallops.  Respiratory: Clear to auscultation bilaterally. GI: Soft, nontender, non-distended  MS: No edema  Assessment & Plan .     Longstanding persistent atrial fibrillation Secondary hypercoagulable state As note in HPI, longstanding persistent afib with failed cardioversion in 2024. Recent management by afib clinic and EP team has focused on rate control and prior to this admission, patient stable on Cardizem CD 120mg . Now admitted with left femur fracture after a mechanical fall and has been more difficult to rate control, prompting cardiology consult. Patient received digoxin 0.125mg  IV injection x2 yesterday, with rates improved into the lower 100s. Converted to oral dosing 0.125mg  today. Although with patient's age Digoxin not ideal, in the short term, recommend  continuing Digoxin due to low BP limiting rate controlling agents. Continue Digoxin 0.125mg  once daily and Cardizem CD 240mg .  Digoxin level needed in 1 week Continue Eliquis 2.5mg  BID. Will plan to arrange close afib clinic follow up prior to d/c. Encouraged more frequent use of incentive spirometer   For questions or updates, please contact [facility] Please consult [email or web address] for contact info under        Signed, Perlie Gold, PA-C   Patient seen and examined with Perlie Gold PA-C.  Agree as above, with the following exceptions and changes as noted below. Pt feeling well, daughter Waynetta Sandy at bedside whom I have met before during patient's prior cardioversion. Gen: NAD, CV: iRRR, no murmurs, Lungs: clear, Abd: soft, Extrem: Warm, well perfused, no edema, Neuro/Psych: alert and oriented x 3, normal mood and affect. All available labs, radiology testing, previous records reviewed. Afib rates around 100-110 today, permissible for discharge. While digoxin is not an optimal strategy for a 89 year frail patient with cognitive impairment, we have no other optimal strategy for rate control, and she has failed cardioversion before. This can be revisited by her EP cardiologist and needs to be followed closely with dig levels and monitoring for symptoms of dig toxicity. Pt and family in agreement with this plan understanding options are very limited. Digoxin 0.125 mg daily, eliquis 2.5 mg BID, cardizem  240 mg daily. Reasonable to consider transfer to rehab facility when otherwise medically ready.   [name], MD 08/07/23 10:59 AM

## 2023-08-08 DIAGNOSIS — K219 Gastro-esophageal reflux disease without esophagitis: Secondary | ICD-10-CM | POA: Diagnosis not present

## 2023-08-08 DIAGNOSIS — J449 Chronic obstructive pulmonary disease, unspecified: Secondary | ICD-10-CM | POA: Diagnosis not present

## 2023-08-08 DIAGNOSIS — Z7901 Long term (current) use of anticoagulants: Secondary | ICD-10-CM | POA: Diagnosis not present

## 2023-08-08 DIAGNOSIS — F0390 Unspecified dementia without behavioral disturbance: Secondary | ICD-10-CM | POA: Diagnosis not present

## 2023-08-08 DIAGNOSIS — E46 Unspecified protein-calorie malnutrition: Secondary | ICD-10-CM | POA: Diagnosis not present

## 2023-08-08 DIAGNOSIS — L89156 Pressure-induced deep tissue damage of sacral region: Secondary | ICD-10-CM | POA: Diagnosis not present

## 2023-08-08 DIAGNOSIS — H918X3 Other specified hearing loss, bilateral: Secondary | ICD-10-CM | POA: Diagnosis not present

## 2023-08-08 DIAGNOSIS — S7290XA Unspecified fracture of unspecified femur, initial encounter for closed fracture: Secondary | ICD-10-CM | POA: Diagnosis not present

## 2023-08-08 DIAGNOSIS — Z66 Do not resuscitate: Secondary | ICD-10-CM | POA: Diagnosis not present

## 2023-08-08 DIAGNOSIS — I480 Paroxysmal atrial fibrillation: Secondary | ICD-10-CM | POA: Diagnosis not present

## 2023-08-08 DIAGNOSIS — J479 Bronchiectasis, uncomplicated: Secondary | ICD-10-CM | POA: Diagnosis not present

## 2023-08-13 ENCOUNTER — Ambulatory Visit (HOSPITAL_COMMUNITY)
Admit: 2023-08-13 | Discharge: 2023-08-13 | Disposition: A | Discharge status: Home / Self Care | Source: Ambulatory Visit | Attending: Physician Assistant | Admitting: Physician Assistant

## 2023-08-13 ENCOUNTER — Encounter (HOSPITAL_COMMUNITY): Payer: Self-pay | Admitting: Physician Assistant

## 2023-08-13 ENCOUNTER — Other Ambulatory Visit: Payer: Self-pay | Admitting: *Deleted

## 2023-08-13 VITALS — BP 130/70 | HR 84 | Ht 60.0 in | Wt 94.0 lb

## 2023-08-13 DIAGNOSIS — Z9181 History of falling: Secondary | ICD-10-CM | POA: Diagnosis not present

## 2023-08-13 DIAGNOSIS — Z79899 Other long term (current) drug therapy: Secondary | ICD-10-CM | POA: Insufficient documentation

## 2023-08-13 DIAGNOSIS — I493 Ventricular premature depolarization: Secondary | ICD-10-CM | POA: Insufficient documentation

## 2023-08-13 DIAGNOSIS — I4819 Other persistent atrial fibrillation: Secondary | ICD-10-CM | POA: Diagnosis present

## 2023-08-13 DIAGNOSIS — D6869 Other thrombophilia: Secondary | ICD-10-CM | POA: Diagnosis not present

## 2023-08-13 DIAGNOSIS — F0390 Unspecified dementia without behavioral disturbance: Secondary | ICD-10-CM | POA: Diagnosis not present

## 2023-08-13 DIAGNOSIS — I4821 Permanent atrial fibrillation: Secondary | ICD-10-CM | POA: Insufficient documentation

## 2023-08-13 DIAGNOSIS — Z7901 Long term (current) use of anticoagulants: Secondary | ICD-10-CM | POA: Insufficient documentation

## 2023-08-13 LAB — DIGOXIN LEVEL: Digoxin Level: 1.2 ng/mL (ref 0.8–2.0)

## 2023-08-13 NOTE — Progress Notes (Signed)
 Primary Care Physician: Thana Ates, MD Primary Cardiologist: None Electrophysiologist: Will Jorja Loa, MD  Referring Physician: Otilio Saber, PA-C     [name] is a 89 y.o. female with a history of bronchiectasis, PVCs, and persistent atrial fibrillation who presents for follow up in the [facility]. History of flecainide use stopped in 2017 due to transition to amiodarone for elevated PVC burden. Amiodarone discontinued with development of junctional rhythm. Seen by Mardelle Matte on 9/20 found to be in Afib with RVR. She underwent unsuccessful DCCV on 02/28/23; cardioverted 5 times with 200J, 300J, and 360J x 3 shocks. She had 30 seconds of sinus rhythm after shock #4 then reverted to Afib with RVR. Patient is on Eliquis 2.5 mg BID for stroke prevention.   On evaluation, she is currently in Afib with RVR. She feels very tired when in Afib. No chest pain or presyncope. No bleeding issues on Eliquis 2.5 mg BID.   On follow up 04/23/23, she is currently in Afib. Seen by Dr. Elberta Fortis on 03/20/23 and after discussion, determination was to proceed with rate control. Patient was started on diltiazem. She has been doing okay overall. She does note to be tired sometimes. No bleeding issues on Eliquis 2.5 mg BID.   Follow up 08/13/23. Patient returns for follow up for atrial fibrillation. She was admitted with a left femur fracture after a mechanical fall 07/31/23. Cardiology was consulted for elevated heart rates. Patient was loaded on digoxin. Patient's daughter provides most of the history today, patient has dementia. She reports that she has done well. No symptoms with her afib.   Today, he denies symptoms of palpitations, chest pain, shortness of breath, orthopnea, PND, lower extremity edema, dizziness, presyncope, syncope, snoring, daytime somnolence, bleeding, or neurologic sequela. The patient is tolerating medications without difficulties and is otherwise without  complaint today.    she has a BMI of Body mass index is 18.36 kg/m.Marland Kitchen Filed Weights   08/13/23 1441  Weight: 42.6 kg    Current Outpatient Medications  Medication Sig Dispense Refill   acetaminophen (TYLENOL) 500 MG tablet Take 2 tablets (1,000 mg total) by mouth every 8 (eight) hours.     apixaban (ELIQUIS) 2.5 MG TABS tablet TAKE 1 TABLET(2.5 MG) BY MOUTH TWICE DAILY 180 tablet 2   Cholecalciferol (VITAMIN D3) 2000 units TABS Take 2,000 Units by mouth daily. Gummy     digoxin (LANOXIN) 0.125 MG tablet Take 1 tablet (0.125 mg total) by mouth daily.     diltiazem (CARDIZEM CD) 240 MG 24 hr capsule Take 1 capsule (240 mg total) by mouth daily.     docusate sodium (COLACE) 100 MG capsule Take 1 capsule (100 mg total) by mouth 2 (two) times daily. 10 capsule 0   feeding supplement (ENSURE ENLIVE / ENSURE PLUS) LIQD Take 237 mLs by mouth 2 (two) times daily between meals. 237 mL 12   ipratropium-albuterol (DUONEB) 0.5-2.5 (3) MG/3ML SOLN Take 3 mLs by nebulization every 6 (six) hours as needed. 360 mL 2   sertraline (ZOLOFT) 50 MG tablet Take 50 mg by mouth daily.     traMADol (ULTRAM) 50 MG tablet Take 1 tablet (50 mg total) by mouth every 6 (six) hours as needed. 20 tablet 0   No current facility-administered medications for this encounter.    Atrial Fibrillation Management history:  Previous antiarrhythmic drugs: flecainide, amiodarone Previous cardioversions: 08/31/15, 02/28/23 (unsuccessful) Previous ablations: none Anticoagulation history: Eliquis 2.5 mg BID   ROS-  All systems are reviewed and negative except as per the HPI above.  Physical Exam: BP 130/70   Pulse 84   Ht 5' (1.524 m)   Wt 42.6 kg   BMI 18.36 kg/m   GEN: Well nourished, well developed in no acute distress NECK: No JVD; No carotid bruits CARDIAC: Irregularly irregular rate and rhythm, no murmurs, rubs, gallops RESPIRATORY:  Clear to auscultation without rales, wheezing or rhonchi  ABDOMEN: Soft,  non-tender, non-distended EXTREMITIES:  No edema; No deformity    EKG today demonstrates  Afib Vent. rate 84 BPM PR interval * ms QRS duration 68 ms QT/QTcB 326/385 ms   Echo 07/03/15 demonstrated  Study Conclusions  - Left ventricle: The cavity size was normal. Systolic function was    normal. The estimated ejection fraction was in the range of 55%    to 60%. Wall motion was normal; there were no regional wall    motion abnormalities.    CHA2DS2-VASc Score = 3  The patient's score is based upon: CHF History: 0 HTN History: 0 Diabetes History: 0 Stroke History: 0 Vascular Disease History: 0 Age Score: 2 Gender Score: 1       ASSESSMENT AND PLAN: Permanent Atrial Fibrillation (ICD10:  I48.11) The patient's CHA2DS2-VASc score is 3, indicating a 3.2% annual risk of stroke.   S/p unsuccessful DCCV 02/2023, now permanent.  Her heart rates have improved with the addition of digoxin.  Continue diltiazem 240 mg daily Continue Eliquis 2.5 mg BID (age, weight) Continue digoxin 0.125 mg daily. Check digoxin level today.   Secondary Hypercoagulable State (ICD10:  D68.69) The patient is at significant risk for stroke/thromboembolism based upon her CHA2DS2-VASc Score of 3.  Continue Apixaban (Eliquis). No bleeding issues.    Follow up with Dr Elberta Fortis in 6 months.     Jorja Loa PA-C Afib Clinic [facility] [address] [place], [place] [address] [phone]

## 2023-08-13 NOTE — Patient Outreach (Signed)
 Ms. Rougeau resides in [facility]. Screening for potential complex care management services as benefit of health plan and primary care provider.  Collaboration with Jill Side, SNF social worker to make aware writer is following for potential complex care management needs.  Jill Side reports they are in the process of scheduling care plan meeting.  Will continue to follow.   Raiford Noble, MSN, RN, BSN [place]  [facility], [facility] RN Post- Acute Care Manager Direct Dial: [phone]

## 2023-08-15 DIAGNOSIS — L89152 Pressure ulcer of sacral region, stage 2: Secondary | ICD-10-CM | POA: Diagnosis not present

## 2023-08-17 DIAGNOSIS — S72042D Displaced fracture of base of neck of left femur, subsequent encounter for closed fracture with routine healing: Secondary | ICD-10-CM | POA: Diagnosis not present

## 2023-08-17 DIAGNOSIS — Z471 Aftercare following joint replacement surgery: Secondary | ICD-10-CM | POA: Diagnosis not present

## 2023-08-22 DIAGNOSIS — L89152 Pressure ulcer of sacral region, stage 2: Secondary | ICD-10-CM | POA: Diagnosis not present

## 2023-08-28 DIAGNOSIS — F02B0 Dementia in other diseases classified elsewhere, moderate, without behavioral disturbance, psychotic disturbance, mood disturbance, and anxiety: Secondary | ICD-10-CM | POA: Diagnosis not present

## 2023-08-29 DIAGNOSIS — L89152 Pressure ulcer of sacral region, stage 2: Secondary | ICD-10-CM | POA: Diagnosis not present

## 2023-09-05 DIAGNOSIS — L89152 Pressure ulcer of sacral region, stage 2: Secondary | ICD-10-CM | POA: Diagnosis not present

## 2023-09-07 ENCOUNTER — Other Ambulatory Visit: Payer: Self-pay | Admitting: *Deleted

## 2023-09-07 NOTE — Progress Notes (Signed)
 Post- Acute Care Manager follow up. Ms. Attaway resides in [facility]. Screening for potential complex care management services as benefit of health plan and primary care provider.  Update received from Stasia Cavalier social worker, Mrs. Meuser has transitioned to LTC.   No identifiable complex care management needs.   Raiford Noble, MSN, RN, BSN [place]  [facility], [facility] RN Post- Acute Care Manager Direct Dial: [phone]

## 2023-09-10 DIAGNOSIS — E039 Hypothyroidism, unspecified: Secondary | ICD-10-CM | POA: Diagnosis not present

## 2023-09-10 DIAGNOSIS — I5032 Chronic diastolic (congestive) heart failure: Secondary | ICD-10-CM | POA: Diagnosis not present

## 2023-09-11 DIAGNOSIS — R7989 Other specified abnormal findings of blood chemistry: Secondary | ICD-10-CM | POA: Diagnosis not present

## 2023-09-11 DIAGNOSIS — I480 Paroxysmal atrial fibrillation: Secondary | ICD-10-CM | POA: Diagnosis not present

## 2023-09-12 DIAGNOSIS — L89152 Pressure ulcer of sacral region, stage 2: Secondary | ICD-10-CM | POA: Diagnosis not present

## 2023-09-21 DIAGNOSIS — I10 Essential (primary) hypertension: Secondary | ICD-10-CM | POA: Diagnosis not present

## 2023-09-21 DIAGNOSIS — S7292XD Unspecified fracture of left femur, subsequent encounter for closed fracture with routine healing: Secondary | ICD-10-CM | POA: Diagnosis not present

## 2023-09-21 DIAGNOSIS — L89152 Pressure ulcer of sacral region, stage 2: Secondary | ICD-10-CM | POA: Diagnosis not present

## 2023-09-21 DIAGNOSIS — R2681 Unsteadiness on feet: Secondary | ICD-10-CM | POA: Diagnosis not present

## 2023-09-21 DIAGNOSIS — R32 Unspecified urinary incontinence: Secondary | ICD-10-CM | POA: Diagnosis not present

## 2023-09-21 DIAGNOSIS — M6281 Muscle weakness (generalized): Secondary | ICD-10-CM | POA: Diagnosis not present

## 2023-09-21 DIAGNOSIS — F03918 Unspecified dementia, unspecified severity, with other behavioral disturbance: Secondary | ICD-10-CM | POA: Diagnosis not present

## 2023-09-21 DIAGNOSIS — Z7901 Long term (current) use of anticoagulants: Secondary | ICD-10-CM | POA: Diagnosis not present

## 2023-09-21 DIAGNOSIS — Z471 Aftercare following joint replacement surgery: Secondary | ICD-10-CM | POA: Diagnosis not present

## 2023-09-24 DIAGNOSIS — S7292XD Unspecified fracture of left femur, subsequent encounter for closed fracture with routine healing: Secondary | ICD-10-CM | POA: Diagnosis not present

## 2023-09-24 DIAGNOSIS — Z7901 Long term (current) use of anticoagulants: Secondary | ICD-10-CM | POA: Diagnosis not present

## 2023-09-24 DIAGNOSIS — F03918 Unspecified dementia, unspecified severity, with other behavioral disturbance: Secondary | ICD-10-CM | POA: Diagnosis not present

## 2023-09-24 DIAGNOSIS — M79675 Pain in left toe(s): Secondary | ICD-10-CM | POA: Diagnosis not present

## 2023-09-24 DIAGNOSIS — B351 Tinea unguium: Secondary | ICD-10-CM | POA: Diagnosis not present

## 2023-09-24 DIAGNOSIS — L89152 Pressure ulcer of sacral region, stage 2: Secondary | ICD-10-CM | POA: Diagnosis not present

## 2023-09-24 DIAGNOSIS — R32 Unspecified urinary incontinence: Secondary | ICD-10-CM | POA: Diagnosis not present

## 2023-09-24 DIAGNOSIS — Z471 Aftercare following joint replacement surgery: Secondary | ICD-10-CM | POA: Diagnosis not present

## 2023-09-24 DIAGNOSIS — M79674 Pain in right toe(s): Secondary | ICD-10-CM | POA: Diagnosis not present

## 2023-09-26 DIAGNOSIS — F03918 Unspecified dementia, unspecified severity, with other behavioral disturbance: Secondary | ICD-10-CM | POA: Diagnosis not present

## 2023-09-26 DIAGNOSIS — S7292XD Unspecified fracture of left femur, subsequent encounter for closed fracture with routine healing: Secondary | ICD-10-CM | POA: Diagnosis not present

## 2023-09-26 DIAGNOSIS — Z7901 Long term (current) use of anticoagulants: Secondary | ICD-10-CM | POA: Diagnosis not present

## 2023-09-26 DIAGNOSIS — L89152 Pressure ulcer of sacral region, stage 2: Secondary | ICD-10-CM | POA: Diagnosis not present

## 2023-09-26 DIAGNOSIS — Z471 Aftercare following joint replacement surgery: Secondary | ICD-10-CM | POA: Diagnosis not present

## 2023-09-26 DIAGNOSIS — R32 Unspecified urinary incontinence: Secondary | ICD-10-CM | POA: Diagnosis not present

## 2023-09-27 DIAGNOSIS — Z471 Aftercare following joint replacement surgery: Secondary | ICD-10-CM | POA: Diagnosis not present

## 2023-09-27 DIAGNOSIS — L89152 Pressure ulcer of sacral region, stage 2: Secondary | ICD-10-CM | POA: Diagnosis not present

## 2023-09-27 DIAGNOSIS — R32 Unspecified urinary incontinence: Secondary | ICD-10-CM | POA: Diagnosis not present

## 2023-09-27 DIAGNOSIS — Z7901 Long term (current) use of anticoagulants: Secondary | ICD-10-CM | POA: Diagnosis not present

## 2023-09-27 DIAGNOSIS — F02B0 Dementia in other diseases classified elsewhere, moderate, without behavioral disturbance, psychotic disturbance, mood disturbance, and anxiety: Secondary | ICD-10-CM | POA: Diagnosis not present

## 2023-09-27 DIAGNOSIS — F03918 Unspecified dementia, unspecified severity, with other behavioral disturbance: Secondary | ICD-10-CM | POA: Diagnosis not present

## 2023-09-27 DIAGNOSIS — S7292XD Unspecified fracture of left femur, subsequent encounter for closed fracture with routine healing: Secondary | ICD-10-CM | POA: Diagnosis not present

## 2023-10-01 DIAGNOSIS — Z471 Aftercare following joint replacement surgery: Secondary | ICD-10-CM | POA: Diagnosis not present

## 2023-10-01 DIAGNOSIS — S72042D Displaced fracture of base of neck of left femur, subsequent encounter for closed fracture with routine healing: Secondary | ICD-10-CM | POA: Diagnosis not present

## 2023-10-02 DIAGNOSIS — F03918 Unspecified dementia, unspecified severity, with other behavioral disturbance: Secondary | ICD-10-CM | POA: Diagnosis not present

## 2023-10-02 DIAGNOSIS — Z471 Aftercare following joint replacement surgery: Secondary | ICD-10-CM | POA: Diagnosis not present

## 2023-10-02 DIAGNOSIS — J479 Bronchiectasis, uncomplicated: Secondary | ICD-10-CM | POA: Diagnosis not present

## 2023-10-02 DIAGNOSIS — I493 Ventricular premature depolarization: Secondary | ICD-10-CM | POA: Diagnosis not present

## 2023-10-02 DIAGNOSIS — H9190 Unspecified hearing loss, unspecified ear: Secondary | ICD-10-CM | POA: Diagnosis not present

## 2023-10-02 DIAGNOSIS — R2681 Unsteadiness on feet: Secondary | ICD-10-CM | POA: Diagnosis not present

## 2023-10-02 DIAGNOSIS — M6281 Muscle weakness (generalized): Secondary | ICD-10-CM | POA: Diagnosis not present

## 2023-10-02 DIAGNOSIS — J449 Chronic obstructive pulmonary disease, unspecified: Secondary | ICD-10-CM | POA: Diagnosis not present

## 2023-10-02 DIAGNOSIS — R32 Unspecified urinary incontinence: Secondary | ICD-10-CM | POA: Diagnosis not present

## 2023-10-02 DIAGNOSIS — Z7901 Long term (current) use of anticoagulants: Secondary | ICD-10-CM | POA: Diagnosis not present

## 2023-10-02 DIAGNOSIS — S7292XD Unspecified fracture of left femur, subsequent encounter for closed fracture with routine healing: Secondary | ICD-10-CM | POA: Diagnosis not present

## 2023-10-02 DIAGNOSIS — L89152 Pressure ulcer of sacral region, stage 2: Secondary | ICD-10-CM | POA: Diagnosis not present

## 2023-10-02 DIAGNOSIS — I10 Essential (primary) hypertension: Secondary | ICD-10-CM | POA: Diagnosis not present

## 2023-10-02 DIAGNOSIS — R41841 Cognitive communication deficit: Secondary | ICD-10-CM | POA: Diagnosis not present

## 2023-10-02 DIAGNOSIS — K219 Gastro-esophageal reflux disease without esophagitis: Secondary | ICD-10-CM | POA: Diagnosis not present

## 2023-10-03 DIAGNOSIS — R32 Unspecified urinary incontinence: Secondary | ICD-10-CM | POA: Diagnosis not present

## 2023-10-03 DIAGNOSIS — L89152 Pressure ulcer of sacral region, stage 2: Secondary | ICD-10-CM | POA: Diagnosis not present

## 2023-10-03 DIAGNOSIS — Z471 Aftercare following joint replacement surgery: Secondary | ICD-10-CM | POA: Diagnosis not present

## 2023-10-03 DIAGNOSIS — Z7901 Long term (current) use of anticoagulants: Secondary | ICD-10-CM | POA: Diagnosis not present

## 2023-10-03 DIAGNOSIS — S7292XD Unspecified fracture of left femur, subsequent encounter for closed fracture with routine healing: Secondary | ICD-10-CM | POA: Diagnosis not present

## 2023-10-03 DIAGNOSIS — F03918 Unspecified dementia, unspecified severity, with other behavioral disturbance: Secondary | ICD-10-CM | POA: Diagnosis not present

## 2023-10-04 DIAGNOSIS — L89152 Pressure ulcer of sacral region, stage 2: Secondary | ICD-10-CM | POA: Diagnosis not present

## 2023-10-04 DIAGNOSIS — F03918 Unspecified dementia, unspecified severity, with other behavioral disturbance: Secondary | ICD-10-CM | POA: Diagnosis not present

## 2023-10-04 DIAGNOSIS — Z471 Aftercare following joint replacement surgery: Secondary | ICD-10-CM | POA: Diagnosis not present

## 2023-10-04 DIAGNOSIS — S7292XD Unspecified fracture of left femur, subsequent encounter for closed fracture with routine healing: Secondary | ICD-10-CM | POA: Diagnosis not present

## 2023-10-04 DIAGNOSIS — R32 Unspecified urinary incontinence: Secondary | ICD-10-CM | POA: Diagnosis not present

## 2023-10-04 DIAGNOSIS — Z7901 Long term (current) use of anticoagulants: Secondary | ICD-10-CM | POA: Diagnosis not present

## 2023-10-08 DIAGNOSIS — Z7901 Long term (current) use of anticoagulants: Secondary | ICD-10-CM | POA: Diagnosis not present

## 2023-10-08 DIAGNOSIS — F03918 Unspecified dementia, unspecified severity, with other behavioral disturbance: Secondary | ICD-10-CM | POA: Diagnosis not present

## 2023-10-08 DIAGNOSIS — Z471 Aftercare following joint replacement surgery: Secondary | ICD-10-CM | POA: Diagnosis not present

## 2023-10-08 DIAGNOSIS — R32 Unspecified urinary incontinence: Secondary | ICD-10-CM | POA: Diagnosis not present

## 2023-10-08 DIAGNOSIS — S7292XD Unspecified fracture of left femur, subsequent encounter for closed fracture with routine healing: Secondary | ICD-10-CM | POA: Diagnosis not present

## 2023-10-08 DIAGNOSIS — L89152 Pressure ulcer of sacral region, stage 2: Secondary | ICD-10-CM | POA: Diagnosis not present

## 2023-10-09 DIAGNOSIS — L89152 Pressure ulcer of sacral region, stage 2: Secondary | ICD-10-CM | POA: Diagnosis not present

## 2023-10-09 DIAGNOSIS — Z471 Aftercare following joint replacement surgery: Secondary | ICD-10-CM | POA: Diagnosis not present

## 2023-10-09 DIAGNOSIS — Z7901 Long term (current) use of anticoagulants: Secondary | ICD-10-CM | POA: Diagnosis not present

## 2023-10-09 DIAGNOSIS — F03918 Unspecified dementia, unspecified severity, with other behavioral disturbance: Secondary | ICD-10-CM | POA: Diagnosis not present

## 2023-10-09 DIAGNOSIS — S7292XD Unspecified fracture of left femur, subsequent encounter for closed fracture with routine healing: Secondary | ICD-10-CM | POA: Diagnosis not present

## 2023-10-09 DIAGNOSIS — R32 Unspecified urinary incontinence: Secondary | ICD-10-CM | POA: Diagnosis not present

## 2023-10-11 DIAGNOSIS — R32 Unspecified urinary incontinence: Secondary | ICD-10-CM | POA: Diagnosis not present

## 2023-10-11 DIAGNOSIS — L89152 Pressure ulcer of sacral region, stage 2: Secondary | ICD-10-CM | POA: Diagnosis not present

## 2023-10-11 DIAGNOSIS — S7292XD Unspecified fracture of left femur, subsequent encounter for closed fracture with routine healing: Secondary | ICD-10-CM | POA: Diagnosis not present

## 2023-10-11 DIAGNOSIS — Z7901 Long term (current) use of anticoagulants: Secondary | ICD-10-CM | POA: Diagnosis not present

## 2023-10-11 DIAGNOSIS — Z471 Aftercare following joint replacement surgery: Secondary | ICD-10-CM | POA: Diagnosis not present

## 2023-10-11 DIAGNOSIS — F03918 Unspecified dementia, unspecified severity, with other behavioral disturbance: Secondary | ICD-10-CM | POA: Diagnosis not present

## 2023-10-15 DIAGNOSIS — L89152 Pressure ulcer of sacral region, stage 2: Secondary | ICD-10-CM | POA: Diagnosis not present

## 2023-10-15 DIAGNOSIS — Z471 Aftercare following joint replacement surgery: Secondary | ICD-10-CM | POA: Diagnosis not present

## 2023-10-15 DIAGNOSIS — R32 Unspecified urinary incontinence: Secondary | ICD-10-CM | POA: Diagnosis not present

## 2023-10-15 DIAGNOSIS — S7292XD Unspecified fracture of left femur, subsequent encounter for closed fracture with routine healing: Secondary | ICD-10-CM | POA: Diagnosis not present

## 2023-10-15 DIAGNOSIS — F03918 Unspecified dementia, unspecified severity, with other behavioral disturbance: Secondary | ICD-10-CM | POA: Diagnosis not present

## 2023-10-15 DIAGNOSIS — Z7901 Long term (current) use of anticoagulants: Secondary | ICD-10-CM | POA: Diagnosis not present

## 2023-10-16 DIAGNOSIS — Z471 Aftercare following joint replacement surgery: Secondary | ICD-10-CM | POA: Diagnosis not present

## 2023-10-16 DIAGNOSIS — L89152 Pressure ulcer of sacral region, stage 2: Secondary | ICD-10-CM | POA: Diagnosis not present

## 2023-10-16 DIAGNOSIS — F03918 Unspecified dementia, unspecified severity, with other behavioral disturbance: Secondary | ICD-10-CM | POA: Diagnosis not present

## 2023-10-16 DIAGNOSIS — R32 Unspecified urinary incontinence: Secondary | ICD-10-CM | POA: Diagnosis not present

## 2023-10-16 DIAGNOSIS — S7292XD Unspecified fracture of left femur, subsequent encounter for closed fracture with routine healing: Secondary | ICD-10-CM | POA: Diagnosis not present

## 2023-10-16 DIAGNOSIS — Z7901 Long term (current) use of anticoagulants: Secondary | ICD-10-CM | POA: Diagnosis not present

## 2023-10-18 DIAGNOSIS — R32 Unspecified urinary incontinence: Secondary | ICD-10-CM | POA: Diagnosis not present

## 2023-10-18 DIAGNOSIS — Z471 Aftercare following joint replacement surgery: Secondary | ICD-10-CM | POA: Diagnosis not present

## 2023-10-18 DIAGNOSIS — L89152 Pressure ulcer of sacral region, stage 2: Secondary | ICD-10-CM | POA: Diagnosis not present

## 2023-10-18 DIAGNOSIS — F03918 Unspecified dementia, unspecified severity, with other behavioral disturbance: Secondary | ICD-10-CM | POA: Diagnosis not present

## 2023-10-18 DIAGNOSIS — S7292XD Unspecified fracture of left femur, subsequent encounter for closed fracture with routine healing: Secondary | ICD-10-CM | POA: Diagnosis not present

## 2023-10-18 DIAGNOSIS — Z7901 Long term (current) use of anticoagulants: Secondary | ICD-10-CM | POA: Diagnosis not present

## 2023-10-21 DIAGNOSIS — R32 Unspecified urinary incontinence: Secondary | ICD-10-CM | POA: Diagnosis not present

## 2023-10-21 DIAGNOSIS — R2681 Unsteadiness on feet: Secondary | ICD-10-CM | POA: Diagnosis not present

## 2023-10-21 DIAGNOSIS — Z471 Aftercare following joint replacement surgery: Secondary | ICD-10-CM | POA: Diagnosis not present

## 2023-10-21 DIAGNOSIS — M6281 Muscle weakness (generalized): Secondary | ICD-10-CM | POA: Diagnosis not present

## 2023-10-21 DIAGNOSIS — S7292XD Unspecified fracture of left femur, subsequent encounter for closed fracture with routine healing: Secondary | ICD-10-CM | POA: Diagnosis not present

## 2023-10-21 DIAGNOSIS — F03918 Unspecified dementia, unspecified severity, with other behavioral disturbance: Secondary | ICD-10-CM | POA: Diagnosis not present

## 2023-10-21 DIAGNOSIS — Z7901 Long term (current) use of anticoagulants: Secondary | ICD-10-CM | POA: Diagnosis not present

## 2023-10-21 DIAGNOSIS — I10 Essential (primary) hypertension: Secondary | ICD-10-CM | POA: Diagnosis not present

## 2023-10-21 DIAGNOSIS — L89152 Pressure ulcer of sacral region, stage 2: Secondary | ICD-10-CM | POA: Diagnosis not present

## 2023-10-23 ENCOUNTER — Other Ambulatory Visit: Payer: Self-pay

## 2023-10-23 ENCOUNTER — Emergency Department (HOSPITAL_COMMUNITY)

## 2023-10-23 ENCOUNTER — Emergency Department (HOSPITAL_COMMUNITY)
Admission: EM | Admit: 2023-10-23 | Discharge: 2023-10-23 | Disposition: A | Discharge status: Home / Self Care | Attending: Emergency Medicine | Admitting: Emergency Medicine

## 2023-10-23 DIAGNOSIS — R000 Tachycardia, unspecified: Secondary | ICD-10-CM | POA: Diagnosis not present

## 2023-10-23 DIAGNOSIS — R32 Unspecified urinary incontinence: Secondary | ICD-10-CM | POA: Diagnosis not present

## 2023-10-23 DIAGNOSIS — Z7901 Long term (current) use of anticoagulants: Secondary | ICD-10-CM | POA: Diagnosis not present

## 2023-10-23 DIAGNOSIS — R404 Transient alteration of awareness: Secondary | ICD-10-CM | POA: Diagnosis not present

## 2023-10-23 DIAGNOSIS — Z7401 Bed confinement status: Secondary | ICD-10-CM | POA: Diagnosis not present

## 2023-10-23 DIAGNOSIS — S7292XD Unspecified fracture of left femur, subsequent encounter for closed fracture with routine healing: Secondary | ICD-10-CM | POA: Diagnosis not present

## 2023-10-23 DIAGNOSIS — R4182 Altered mental status, unspecified: Secondary | ICD-10-CM | POA: Diagnosis not present

## 2023-10-23 DIAGNOSIS — F03918 Unspecified dementia, unspecified severity, with other behavioral disturbance: Secondary | ICD-10-CM | POA: Diagnosis not present

## 2023-10-23 DIAGNOSIS — Z471 Aftercare following joint replacement surgery: Secondary | ICD-10-CM | POA: Diagnosis not present

## 2023-10-23 DIAGNOSIS — R531 Weakness: Secondary | ICD-10-CM | POA: Diagnosis not present

## 2023-10-23 DIAGNOSIS — R918 Other nonspecific abnormal finding of lung field: Secondary | ICD-10-CM | POA: Diagnosis not present

## 2023-10-23 DIAGNOSIS — L89152 Pressure ulcer of sacral region, stage 2: Secondary | ICD-10-CM | POA: Diagnosis not present

## 2023-10-23 DIAGNOSIS — I10 Essential (primary) hypertension: Secondary | ICD-10-CM | POA: Diagnosis not present

## 2023-10-23 LAB — BLOOD GAS, VENOUS
Acid-Base Excess: 6.4 mmol/L — ABNORMAL HIGH (ref 0.0–2.0)
Bicarbonate: 31.3 mmol/L — ABNORMAL HIGH (ref 20.0–28.0)
O2 Saturation: 57.4 %
Patient temperature: 37
pCO2, Ven: 45 mmHg (ref 44–60)
pH, Ven: 7.45 — ABNORMAL HIGH (ref 7.25–7.43)
pO2, Ven: 35 mmHg (ref 32–45)

## 2023-10-23 LAB — COMPREHENSIVE METABOLIC PANEL WITH GFR
ALT: 13 U/L (ref 0–44)
AST: 27 U/L (ref 15–41)
Albumin: 3.1 g/dL — ABNORMAL LOW (ref 3.5–5.0)
Alkaline Phosphatase: 78 U/L (ref 38–126)
Anion gap: 12 (ref 5–15)
BUN: 12 mg/dL (ref 8–23)
CO2: 26 mmol/L (ref 22–32)
Calcium: 8.5 mg/dL — ABNORMAL LOW (ref 8.9–10.3)
Chloride: 101 mmol/L (ref 98–111)
Creatinine, Ser: 0.59 mg/dL (ref 0.44–1.00)
GFR, Estimated: >60 mL/min (ref >60)
Glucose, Bld: 103 mg/dL — ABNORMAL HIGH (ref 70–99)
Potassium: 4.1 mmol/L (ref 3.5–5.1)
Sodium: 139 mmol/L (ref 135–145)
Total Bilirubin: 1.3 mg/dL — ABNORMAL HIGH (ref 0.0–1.2)
Total Protein: 6.8 g/dL (ref 6.5–8.1)

## 2023-10-23 LAB — CBC WITH DIFFERENTIAL/PLATELET
Abs Immature Granulocytes: 0.04 10*3/uL (ref 0.00–0.07)
Basophils Absolute: 0 10*3/uL (ref 0.0–0.1)
Basophils Relative: 0 %
Eosinophils Absolute: 0 10*3/uL (ref 0.0–0.5)
Eosinophils Relative: 0 %
HCT: 40.4 % (ref 36.0–46.0)
Hemoglobin: 13 g/dL (ref 12.0–15.0)
Immature Granulocytes: 1 %
Lymphocytes Relative: 9 %
Lymphs Abs: 0.7 10*3/uL (ref 0.7–4.0)
MCH: 29.3 pg (ref 26.0–34.0)
MCHC: 32.2 g/dL (ref 30.0–36.0)
MCV: 91.2 fL (ref 80.0–100.0)
Monocytes Absolute: 0.5 10*3/uL (ref 0.1–1.0)
Monocytes Relative: 6 %
Neutro Abs: 6.8 10*3/uL (ref 1.7–7.7)
Neutrophils Relative %: 84 %
Platelets: 223 10*3/uL (ref 150–400)
RBC: 4.43 MIL/uL (ref 3.87–5.11)
RDW: 14 % (ref 11.5–15.5)
WBC: 8.1 10*3/uL (ref 4.0–10.5)
nRBC: 0 % (ref 0.0–0.2)

## 2023-10-23 LAB — AMMONIA: Ammonia: 19 umol/L (ref 9–35)

## 2023-10-23 LAB — URINALYSIS, ROUTINE W REFLEX MICROSCOPIC
Bacteria, UA: NONE SEEN
Bilirubin Urine: NEGATIVE
Glucose, UA: NEGATIVE mg/dL
Hgb urine dipstick: NEGATIVE
Ketones, ur: NEGATIVE mg/dL
Nitrite: NEGATIVE
Protein, ur: NEGATIVE mg/dL
Specific Gravity, Urine: 1.015 (ref 1.005–1.030)
pH: 6 (ref 5.0–8.0)

## 2023-10-23 LAB — I-STAT CG4 LACTIC ACID, ED: Lactic Acid, Venous: 0.9 mmol/L (ref 0.5–1.9)

## 2023-10-23 LAB — DIGOXIN LEVEL: Digoxin Level: 1.1 ng/mL (ref 0.8–2.0)

## 2023-10-23 NOTE — ED Provider Notes (Signed)
 [place] EMERGENCY DEPARTMENT AT [facility] Provider Note   CSN: [id number] Arrival date & time: 10/23/23  1042     History  Chief Complaint  Patient presents with   Altered Mental Status    JASLEN ADCOX is a 89 y.o. female.  89 yo F with a cc of altered mental status.  Going on since about 830.  Reportedly was normal just before then.  PT came and was unable to get her to respond.  EMS arrived and then put her hearing aids and she was able to talk with them without issue.  Reportedly had no complaints for them.  Patient does seem confused to me.  She will not answer direct questions.  Has trouble following commands.  Level 5 caveat altered mental status.   Altered Mental Status      Home Medications Prior to Admission medications   Medication Sig Start Date End Date Taking? Authorizing Provider  acetaminophen  (TYLENOL ) 500 MG tablet Take 1,000 mg by mouth every 8 (eight) hours as needed for moderate pain (pain score 4-6) or mild pain (pain score 1-3).   Yes [provider]  apixaban  (ELIQUIS ) 2.5 MG TABS tablet TAKE 1 TABLET(2.5 MG) BY MOUTH TWICE DAILY 06/01/22  Yes Debbie Fails, PA-C  Cholecalciferol  (VITAMIN D3) 2000 units TABS Take 2,000 Units by mouth daily.   Yes [provider]  digoxin  (LANOXIN ) 0.125 MG tablet Take 1 tablet (0.125 mg total) by mouth daily. 08/08/23  Yes Danford, Willis Harter, MD  diltiazem  (CARDIZEM  CD) 240 MG 24 hr capsule Take 1 capsule (240 mg total) by mouth daily. 08/08/23  Yes Danford, Willis Harter, MD  ipratropium-albuterol  (DUONEB) 0.5-2.5 (3) MG/3ML SOLN Take 3 mLs by nebulization every 6 (six) hours as needed. Patient taking differently: Take 3 mLs by nebulization every 6 (six) hours as needed (wheezing/SOB). 05/18/23  Yes Cobb, Mariah Shines, NP  sennosides-docusate sodium  (SENOKOT-S) 8.6-50 MG tablet Take 1 tablet by mouth in the morning and at bedtime.   Yes [provider]  sertraline  (ZOLOFT )  50 MG tablet Take 50 mg by mouth daily.   Yes [provider]      Allergies    Augmentin [amoxicillin-pot clavulanate], Biaxin [clarithromycin], Metoprolol , Other, and Tequin [gatifloxacin]    Review of Systems   Review of Systems  Physical Exam Updated Vital Signs BP 113/79   Pulse (!) 105   Temp (!) 97.1 F (36.2 C) (Oral)   Resp (!) 24   Ht 5' (1.524 m)   Wt 42 kg   SpO2 96%   BMI 18.08 kg/m  Physical Exam Vitals and nursing note reviewed.  Constitutional:      General: She is not in acute distress.    Appearance: She is well-developed. She is not diaphoretic.  HENT:     Head: Normocephalic and atraumatic.  Eyes:     Pupils: Pupils are equal, round, and reactive to light.  Cardiovascular:     Rate and Rhythm: Normal rate and regular rhythm.     Heart sounds: No murmur heard.    No friction rub. No gallop.  Pulmonary:     Effort: Pulmonary effort is normal.     Breath sounds: No wheezing or rales.  Abdominal:     General: There is no distension.     Palpations: Abdomen is soft.     Tenderness: There is no abdominal tenderness.  Musculoskeletal:        General: No tenderness.  Cervical back: Normal range of motion and neck supple.  Skin:    General: Skin is warm and dry.  Neurological:     Mental Status: She is alert and oriented to person, place, and time.  Psychiatric:        Behavior: Behavior normal.     ED Results / Procedures / Treatments   Labs (all labs ordered are listed, but only abnormal results are displayed) Labs Reviewed  COMPREHENSIVE METABOLIC PANEL WITH GFR - Abnormal; Notable for the following components:      Result Value   Glucose, Bld 103 (*)    Calcium 8.5 (*)    Albumin 3.1 (*)    Total Bilirubin 1.3 (*)    All other components within normal limits  URINALYSIS, ROUTINE W REFLEX MICROSCOPIC - Abnormal; Notable for the following components:   Leukocytes,Ua TRACE (*)    All other components within normal limits   BLOOD GAS, VENOUS - Abnormal; Notable for the following components:   pH, Ven 7.45 (*)    Bicarbonate 31.3 (*)    Acid-Base Excess 6.4 (*)    All other components within normal limits  AMMONIA  CBC WITH DIFFERENTIAL/PLATELET  DIGOXIN  LEVEL  CBG MONITORING, ED  I-STAT CG4 LACTIC ACID, ED  I-STAT CG4 LACTIC ACID, ED    EKG EKG Interpretation Date/Time:  Tuesday Oct 23 2023 11:03:47 EDT Ventricular Rate:  118 PR Interval:    QRS Duration:  84 QT Interval:  249 QTC Calculation: 361 R Axis:   245  Text Interpretation: Atrial fibrillation Left anterior fascicular block Consider left ventricular hypertrophy Repol abnrm, severe global ischemia (LM/MVD) No significant change since last tracing Confirmed by Albertus Hughs [phone]) on 10/23/2023 2:14:17 PM  Radiology DG Chest Port 1 View Result Date: 10/23/2023 CLINICAL DATA:  AMS EXAM: PORTABLE CHEST - 1 VIEW COMPARISON:  07/30/2023 FINDINGS: Chronic coarse apical interstitial opacities. No new infiltrate or overt edema. Biapical pleuroparenchymal scarring. Heart size and mediastinal contours are within normal limits. Aortic Atherosclerosis (ICD10-170.0). No effusion. Visualized bones unremarkable. IMPRESSION: Chronic apical interstitial opacities. No acute findings. Electronically Signed   By: Nicoletta Barrier M.D.   On: 10/23/2023 14:56   CT HEAD WO CONTRAST Result Date: 10/23/2023 CLINICAL DATA:  89 year old female with altered mental status, decreased responsiveness. EXAM: CT HEAD WITHOUT CONTRAST TECHNIQUE: Contiguous axial images were obtained from the base of the skull through the vertex without intravenous contrast. RADIATION DOSE REDUCTION: This exam was performed according to the departmental dose-optimization program which includes automated exposure control, adjustment of the mA and/or kV according to patient size and/or use of iterative reconstruction technique. COMPARISON:  Head CT 07/30/2023. FINDINGS: Brain: No midline shift,  ventriculomegaly, mass effect, evidence of mass lesion, intracranial hemorrhage or evidence of cortically based acute infarction. Patchy mostly periventricular bilateral cerebral white matter hypodensity. Stable gray-white matter differentiation throughout the brain. Basal ganglia vascular calcifications. Vascular: Calcified atherosclerosis at the skull base. No suspicious intracranial vascular hyperdensity. Skull: Hyperostosis of the calvarium. No acute osseous abnormality identified. Sinuses/Orbits: Visualized paranasal sinuses and mastoids are stable and well aerated. Other: Stable orbit and scalp soft tissues. IMPRESSION: 1. No acute intracranial abnormality. 2. Stable non contrast CT appearance of chronic white matter disease. Electronically Signed   By: Marlise Simpers M.D.   On: 10/23/2023 11:59    Procedures Procedures    Medications Ordered in ED Medications - No data to display  ED Course/ Medical Decision Making/ A&P  Medical Decision Making Amount and/or Complexity of Data Reviewed Labs: ordered. Radiology: ordered.   89 yo F with a cc of AMS.  Reportedly started this morning.  EMS reports some improvement with placing her hearing aids then.  However for me she is quite confused.  Has difficulty following commands.  Not sure if this is due to her still being very hard of hearing or if the patient is acutely altered as her facility suggests.  Will obtain an altered mental status workup.  CT of the head.  Blood work.  UA.  Reassess.  CT of the head without obvious acute intracranial pathology.  UA negative for infection.  No significant electrolyte abnormalities.  No acute anemia.  Chest x-ray independently interpreted by me without obvious focal infiltrate or pneumothorax.  Patient's family is here and do agree the patient is at her baseline.  Will discharge home.  PCP follow-up.  3:18 PM:  I have discussed the diagnosis/risks/treatment options with the  patient.  Evaluation and diagnostic testing in the emergency department does not suggest an emergent condition requiring admission or immediate intervention beyond what has been performed at this time.  They will follow up with PCP. We also discussed returning to the ED immediately if new or worsening sx occur. We discussed the sx which are most concerning (e.g., sudden worsening pain, fever, inability to tolerate by mouth) that necessitate immediate return. Medications administered to the patient during their visit and any new prescriptions provided to the patient are listed below.  Medications given during this visit Medications - No data to display   The patient appears reasonably screen and/or stabilized for discharge and I doubt any other medical condition or other [facility] requiring further screening, evaluation, or treatment in the ED at this time prior to discharge.          Final Clinical Impression(s) / ED Diagnoses Final diagnoses:  Transient alteration of awareness    Rx / DC Orders ED Discharge Orders     None         Albertus Hughs, DO 10/23/23 1518

## 2023-10-23 NOTE — ED Triage Notes (Signed)
 Pt sent from [facility] for altered mental status. Pt has history of Alzheimer disease. Staff states that pt was normal during breakfast, but when physical therapy attempted to work with her, she was not responding. No reports of LOC. EMS states that when they put her hearing aids in, the pt began responding to questions and following commands.

## 2023-10-23 NOTE — Discharge Instructions (Signed)
 We did not find any obvious laboratory or imaging findings that would result in your change in mental status.  Please follow-up with your family doctor in the office.

## 2023-10-24 DIAGNOSIS — Z7901 Long term (current) use of anticoagulants: Secondary | ICD-10-CM | POA: Diagnosis not present

## 2023-10-24 DIAGNOSIS — Z471 Aftercare following joint replacement surgery: Secondary | ICD-10-CM | POA: Diagnosis not present

## 2023-10-24 DIAGNOSIS — F03918 Unspecified dementia, unspecified severity, with other behavioral disturbance: Secondary | ICD-10-CM | POA: Diagnosis not present

## 2023-10-24 DIAGNOSIS — L89152 Pressure ulcer of sacral region, stage 2: Secondary | ICD-10-CM | POA: Diagnosis not present

## 2023-10-24 DIAGNOSIS — R32 Unspecified urinary incontinence: Secondary | ICD-10-CM | POA: Diagnosis not present

## 2023-10-24 DIAGNOSIS — S7292XD Unspecified fracture of left femur, subsequent encounter for closed fracture with routine healing: Secondary | ICD-10-CM | POA: Diagnosis not present

## 2023-10-26 DIAGNOSIS — F03918 Unspecified dementia, unspecified severity, with other behavioral disturbance: Secondary | ICD-10-CM | POA: Diagnosis not present

## 2023-10-26 DIAGNOSIS — Z7901 Long term (current) use of anticoagulants: Secondary | ICD-10-CM | POA: Diagnosis not present

## 2023-10-26 DIAGNOSIS — S7292XD Unspecified fracture of left femur, subsequent encounter for closed fracture with routine healing: Secondary | ICD-10-CM | POA: Diagnosis not present

## 2023-10-26 DIAGNOSIS — R32 Unspecified urinary incontinence: Secondary | ICD-10-CM | POA: Diagnosis not present

## 2023-10-26 DIAGNOSIS — Z471 Aftercare following joint replacement surgery: Secondary | ICD-10-CM | POA: Diagnosis not present

## 2023-10-26 DIAGNOSIS — L89152 Pressure ulcer of sacral region, stage 2: Secondary | ICD-10-CM | POA: Diagnosis not present

## 2023-10-29 DIAGNOSIS — I480 Paroxysmal atrial fibrillation: Secondary | ICD-10-CM | POA: Diagnosis not present

## 2023-10-29 DIAGNOSIS — I10 Essential (primary) hypertension: Secondary | ICD-10-CM | POA: Diagnosis not present

## 2023-10-29 DIAGNOSIS — J449 Chronic obstructive pulmonary disease, unspecified: Secondary | ICD-10-CM | POA: Diagnosis not present

## 2023-10-29 DIAGNOSIS — L89152 Pressure ulcer of sacral region, stage 2: Secondary | ICD-10-CM | POA: Diagnosis not present

## 2023-10-29 DIAGNOSIS — S7292XD Unspecified fracture of left femur, subsequent encounter for closed fracture with routine healing: Secondary | ICD-10-CM | POA: Diagnosis not present

## 2023-10-29 DIAGNOSIS — F03918 Unspecified dementia, unspecified severity, with other behavioral disturbance: Secondary | ICD-10-CM | POA: Diagnosis not present

## 2023-10-29 DIAGNOSIS — R32 Unspecified urinary incontinence: Secondary | ICD-10-CM | POA: Diagnosis not present

## 2023-10-29 DIAGNOSIS — Z471 Aftercare following joint replacement surgery: Secondary | ICD-10-CM | POA: Diagnosis not present

## 2023-10-29 DIAGNOSIS — R4182 Altered mental status, unspecified: Secondary | ICD-10-CM | POA: Diagnosis not present

## 2023-10-29 DIAGNOSIS — Z7901 Long term (current) use of anticoagulants: Secondary | ICD-10-CM | POA: Diagnosis not present

## 2023-10-30 DIAGNOSIS — F03918 Unspecified dementia, unspecified severity, with other behavioral disturbance: Secondary | ICD-10-CM | POA: Diagnosis not present

## 2023-10-30 DIAGNOSIS — Z471 Aftercare following joint replacement surgery: Secondary | ICD-10-CM | POA: Diagnosis not present

## 2023-10-30 DIAGNOSIS — R32 Unspecified urinary incontinence: Secondary | ICD-10-CM | POA: Diagnosis not present

## 2023-10-30 DIAGNOSIS — L89152 Pressure ulcer of sacral region, stage 2: Secondary | ICD-10-CM | POA: Diagnosis not present

## 2023-10-30 DIAGNOSIS — S7292XD Unspecified fracture of left femur, subsequent encounter for closed fracture with routine healing: Secondary | ICD-10-CM | POA: Diagnosis not present

## 2023-10-30 DIAGNOSIS — Z7901 Long term (current) use of anticoagulants: Secondary | ICD-10-CM | POA: Diagnosis not present

## 2023-11-01 DIAGNOSIS — Z7901 Long term (current) use of anticoagulants: Secondary | ICD-10-CM | POA: Diagnosis not present

## 2023-11-01 DIAGNOSIS — L89152 Pressure ulcer of sacral region, stage 2: Secondary | ICD-10-CM | POA: Diagnosis not present

## 2023-11-01 DIAGNOSIS — Z471 Aftercare following joint replacement surgery: Secondary | ICD-10-CM | POA: Diagnosis not present

## 2023-11-01 DIAGNOSIS — F03918 Unspecified dementia, unspecified severity, with other behavioral disturbance: Secondary | ICD-10-CM | POA: Diagnosis not present

## 2023-11-01 DIAGNOSIS — S7292XD Unspecified fracture of left femur, subsequent encounter for closed fracture with routine healing: Secondary | ICD-10-CM | POA: Diagnosis not present

## 2023-11-01 DIAGNOSIS — R32 Unspecified urinary incontinence: Secondary | ICD-10-CM | POA: Diagnosis not present

## 2023-11-02 DIAGNOSIS — R32 Unspecified urinary incontinence: Secondary | ICD-10-CM | POA: Diagnosis not present

## 2023-11-02 DIAGNOSIS — L89152 Pressure ulcer of sacral region, stage 2: Secondary | ICD-10-CM | POA: Diagnosis not present

## 2023-11-02 DIAGNOSIS — Z471 Aftercare following joint replacement surgery: Secondary | ICD-10-CM | POA: Diagnosis not present

## 2023-11-02 DIAGNOSIS — F03918 Unspecified dementia, unspecified severity, with other behavioral disturbance: Secondary | ICD-10-CM | POA: Diagnosis not present

## 2023-11-02 DIAGNOSIS — S7292XD Unspecified fracture of left femur, subsequent encounter for closed fracture with routine healing: Secondary | ICD-10-CM | POA: Diagnosis not present

## 2023-11-02 DIAGNOSIS — Z7901 Long term (current) use of anticoagulants: Secondary | ICD-10-CM | POA: Diagnosis not present

## 2023-11-06 DIAGNOSIS — L89152 Pressure ulcer of sacral region, stage 2: Secondary | ICD-10-CM | POA: Diagnosis not present

## 2023-11-06 DIAGNOSIS — S7292XD Unspecified fracture of left femur, subsequent encounter for closed fracture with routine healing: Secondary | ICD-10-CM | POA: Diagnosis not present

## 2023-11-06 DIAGNOSIS — Z471 Aftercare following joint replacement surgery: Secondary | ICD-10-CM | POA: Diagnosis not present

## 2023-11-06 DIAGNOSIS — F03918 Unspecified dementia, unspecified severity, with other behavioral disturbance: Secondary | ICD-10-CM | POA: Diagnosis not present

## 2023-11-06 DIAGNOSIS — R32 Unspecified urinary incontinence: Secondary | ICD-10-CM | POA: Diagnosis not present

## 2023-11-06 DIAGNOSIS — Z7901 Long term (current) use of anticoagulants: Secondary | ICD-10-CM | POA: Diagnosis not present

## 2023-11-07 DIAGNOSIS — Z7901 Long term (current) use of anticoagulants: Secondary | ICD-10-CM | POA: Diagnosis not present

## 2023-11-07 DIAGNOSIS — F03918 Unspecified dementia, unspecified severity, with other behavioral disturbance: Secondary | ICD-10-CM | POA: Diagnosis not present

## 2023-11-07 DIAGNOSIS — S7292XD Unspecified fracture of left femur, subsequent encounter for closed fracture with routine healing: Secondary | ICD-10-CM | POA: Diagnosis not present

## 2023-11-07 DIAGNOSIS — Z471 Aftercare following joint replacement surgery: Secondary | ICD-10-CM | POA: Diagnosis not present

## 2023-11-07 DIAGNOSIS — L89152 Pressure ulcer of sacral region, stage 2: Secondary | ICD-10-CM | POA: Diagnosis not present

## 2023-11-07 DIAGNOSIS — R32 Unspecified urinary incontinence: Secondary | ICD-10-CM | POA: Diagnosis not present

## 2023-11-08 DIAGNOSIS — R32 Unspecified urinary incontinence: Secondary | ICD-10-CM | POA: Diagnosis not present

## 2023-11-08 DIAGNOSIS — L89152 Pressure ulcer of sacral region, stage 2: Secondary | ICD-10-CM | POA: Diagnosis not present

## 2023-11-08 DIAGNOSIS — Z7901 Long term (current) use of anticoagulants: Secondary | ICD-10-CM | POA: Diagnosis not present

## 2023-11-08 DIAGNOSIS — F03918 Unspecified dementia, unspecified severity, with other behavioral disturbance: Secondary | ICD-10-CM | POA: Diagnosis not present

## 2023-11-08 DIAGNOSIS — S7292XD Unspecified fracture of left femur, subsequent encounter for closed fracture with routine healing: Secondary | ICD-10-CM | POA: Diagnosis not present

## 2023-11-08 DIAGNOSIS — Z471 Aftercare following joint replacement surgery: Secondary | ICD-10-CM | POA: Diagnosis not present

## 2023-11-09 ENCOUNTER — Encounter (HOSPITAL_BASED_OUTPATIENT_CLINIC_OR_DEPARTMENT_OTHER): Payer: Self-pay

## 2023-11-09 ENCOUNTER — Emergency Department (HOSPITAL_BASED_OUTPATIENT_CLINIC_OR_DEPARTMENT_OTHER)

## 2023-11-09 ENCOUNTER — Inpatient Hospital Stay (HOSPITAL_BASED_OUTPATIENT_CLINIC_OR_DEPARTMENT_OTHER)
Admission: EM | Admit: 2023-11-09 | Discharge: 2023-11-12 | DRG: 193 | Disposition: A | Discharge status: Hospice | Source: Skilled Nursing Facility | Attending: Internal Medicine | Admitting: Internal Medicine

## 2023-11-09 ENCOUNTER — Other Ambulatory Visit: Payer: Self-pay

## 2023-11-09 DIAGNOSIS — R918 Other nonspecific abnormal finding of lung field: Secondary | ICD-10-CM | POA: Diagnosis not present

## 2023-11-09 DIAGNOSIS — I771 Stricture of artery: Secondary | ICD-10-CM | POA: Diagnosis not present

## 2023-11-09 DIAGNOSIS — Z832 Family history of diseases of the blood and blood-forming organs and certain disorders involving the immune mechanism: Secondary | ICD-10-CM

## 2023-11-09 DIAGNOSIS — G4700 Insomnia, unspecified: Secondary | ICD-10-CM | POA: Diagnosis present

## 2023-11-09 DIAGNOSIS — I4821 Permanent atrial fibrillation: Secondary | ICD-10-CM | POA: Diagnosis not present

## 2023-11-09 DIAGNOSIS — E162 Hypoglycemia, unspecified: Secondary | ICD-10-CM | POA: Diagnosis not present

## 2023-11-09 DIAGNOSIS — G311 Senile degeneration of brain, not elsewhere classified: Secondary | ICD-10-CM | POA: Diagnosis not present

## 2023-11-09 DIAGNOSIS — Z888 Allergy status to other drugs, medicaments and biological substances status: Secondary | ICD-10-CM

## 2023-11-09 DIAGNOSIS — Z1152 Encounter for screening for COVID-19: Secondary | ICD-10-CM | POA: Diagnosis not present

## 2023-11-09 DIAGNOSIS — F05 Delirium due to known physiological condition: Secondary | ICD-10-CM | POA: Diagnosis present

## 2023-11-09 DIAGNOSIS — F03911 Unspecified dementia, unspecified severity, with agitation: Secondary | ICD-10-CM | POA: Diagnosis not present

## 2023-11-09 DIAGNOSIS — E8809 Other disorders of plasma-protein metabolism, not elsewhere classified: Secondary | ICD-10-CM | POA: Diagnosis present

## 2023-11-09 DIAGNOSIS — A419 Sepsis, unspecified organism: Secondary | ICD-10-CM | POA: Diagnosis not present

## 2023-11-09 DIAGNOSIS — I4891 Unspecified atrial fibrillation: Secondary | ICD-10-CM | POA: Diagnosis not present

## 2023-11-09 DIAGNOSIS — I959 Hypotension, unspecified: Secondary | ICD-10-CM | POA: Diagnosis not present

## 2023-11-09 DIAGNOSIS — D696 Thrombocytopenia, unspecified: Secondary | ICD-10-CM | POA: Diagnosis not present

## 2023-11-09 DIAGNOSIS — I700 Atherosclerosis of aorta: Secondary | ICD-10-CM | POA: Diagnosis present

## 2023-11-09 DIAGNOSIS — R008 Other abnormalities of heart beat: Secondary | ICD-10-CM | POA: Diagnosis not present

## 2023-11-09 DIAGNOSIS — I480 Paroxysmal atrial fibrillation: Secondary | ICD-10-CM | POA: Diagnosis not present

## 2023-11-09 DIAGNOSIS — Z7901 Long term (current) use of anticoagulants: Secondary | ICD-10-CM | POA: Diagnosis not present

## 2023-11-09 DIAGNOSIS — J449 Chronic obstructive pulmonary disease, unspecified: Secondary | ICD-10-CM | POA: Diagnosis not present

## 2023-11-09 DIAGNOSIS — Z604 Social exclusion and rejection: Secondary | ICD-10-CM | POA: Diagnosis present

## 2023-11-09 DIAGNOSIS — Z825 Family history of asthma and other chronic lower respiratory diseases: Secondary | ICD-10-CM | POA: Diagnosis not present

## 2023-11-09 DIAGNOSIS — Z515 Encounter for palliative care: Secondary | ICD-10-CM | POA: Diagnosis not present

## 2023-11-09 DIAGNOSIS — Z8249 Family history of ischemic heart disease and other diseases of the circulatory system: Secondary | ICD-10-CM | POA: Diagnosis not present

## 2023-11-09 DIAGNOSIS — D649 Anemia, unspecified: Secondary | ICD-10-CM | POA: Diagnosis not present

## 2023-11-09 DIAGNOSIS — I5023 Acute on chronic systolic (congestive) heart failure: Secondary | ICD-10-CM | POA: Diagnosis not present

## 2023-11-09 DIAGNOSIS — K219 Gastro-esophageal reflux disease without esophagitis: Secondary | ICD-10-CM | POA: Diagnosis not present

## 2023-11-09 DIAGNOSIS — Z79899 Other long term (current) drug therapy: Secondary | ICD-10-CM

## 2023-11-09 DIAGNOSIS — Z7401 Bed confinement status: Secondary | ICD-10-CM | POA: Diagnosis not present

## 2023-11-09 DIAGNOSIS — J440 Chronic obstructive pulmonary disease with acute lower respiratory infection: Secondary | ICD-10-CM | POA: Diagnosis not present

## 2023-11-09 DIAGNOSIS — F0390 Unspecified dementia without behavioral disturbance: Secondary | ICD-10-CM | POA: Diagnosis not present

## 2023-11-09 DIAGNOSIS — R0902 Hypoxemia: Secondary | ICD-10-CM | POA: Diagnosis not present

## 2023-11-09 DIAGNOSIS — J90 Pleural effusion, not elsewhere classified: Secondary | ICD-10-CM | POA: Diagnosis not present

## 2023-11-09 DIAGNOSIS — R0689 Other abnormalities of breathing: Secondary | ICD-10-CM | POA: Diagnosis not present

## 2023-11-09 DIAGNOSIS — I493 Ventricular premature depolarization: Secondary | ICD-10-CM | POA: Diagnosis present

## 2023-11-09 DIAGNOSIS — Z66 Do not resuscitate: Secondary | ICD-10-CM | POA: Diagnosis not present

## 2023-11-09 DIAGNOSIS — Z993 Dependence on wheelchair: Secondary | ICD-10-CM | POA: Diagnosis not present

## 2023-11-09 DIAGNOSIS — R404 Transient alteration of awareness: Secondary | ICD-10-CM | POA: Diagnosis not present

## 2023-11-09 DIAGNOSIS — J122 Parainfluenza virus pneumonia: Secondary | ICD-10-CM | POA: Diagnosis not present

## 2023-11-09 DIAGNOSIS — Z87891 Personal history of nicotine dependence: Secondary | ICD-10-CM

## 2023-11-09 DIAGNOSIS — J470 Bronchiectasis with acute lower respiratory infection: Secondary | ICD-10-CM | POA: Diagnosis not present

## 2023-11-09 DIAGNOSIS — Z96642 Presence of left artificial hip joint: Secondary | ICD-10-CM | POA: Diagnosis not present

## 2023-11-09 DIAGNOSIS — R0602 Shortness of breath: Secondary | ICD-10-CM | POA: Diagnosis not present

## 2023-11-09 DIAGNOSIS — J9601 Acute respiratory failure with hypoxia: Secondary | ICD-10-CM | POA: Diagnosis present

## 2023-11-09 DIAGNOSIS — J479 Bronchiectasis, uncomplicated: Secondary | ICD-10-CM | POA: Diagnosis not present

## 2023-11-09 DIAGNOSIS — E876 Hypokalemia: Secondary | ICD-10-CM | POA: Diagnosis not present

## 2023-11-09 DIAGNOSIS — J189 Pneumonia, unspecified organism: Secondary | ICD-10-CM | POA: Diagnosis not present

## 2023-11-09 DIAGNOSIS — J984 Other disorders of lung: Secondary | ICD-10-CM | POA: Diagnosis not present

## 2023-11-09 DIAGNOSIS — Z823 Family history of stroke: Secondary | ICD-10-CM

## 2023-11-09 DIAGNOSIS — B348 Other viral infections of unspecified site: Secondary | ICD-10-CM | POA: Diagnosis not present

## 2023-11-09 DIAGNOSIS — I482 Chronic atrial fibrillation, unspecified: Secondary | ICD-10-CM | POA: Diagnosis not present

## 2023-11-09 DIAGNOSIS — J441 Chronic obstructive pulmonary disease with (acute) exacerbation: Secondary | ICD-10-CM | POA: Diagnosis present

## 2023-11-09 DIAGNOSIS — J9600 Acute respiratory failure, unspecified whether with hypoxia or hypercapnia: Secondary | ICD-10-CM | POA: Diagnosis not present

## 2023-11-09 LAB — COMPREHENSIVE METABOLIC PANEL WITH GFR
ALT: 9 U/L (ref 0–44)
AST: 30 U/L (ref 15–41)
Albumin: 3.7 g/dL (ref 3.5–5.0)
Alkaline Phosphatase: 98 U/L (ref 38–126)
Anion gap: 16 — ABNORMAL HIGH (ref 5–15)
BUN: 14 mg/dL (ref 8–23)
CO2: 27 mmol/L (ref 22–32)
Calcium: 9.5 mg/dL (ref 8.9–10.3)
Chloride: 95 mmol/L — ABNORMAL LOW (ref 98–111)
Creatinine, Ser: 0.62 mg/dL (ref 0.44–1.00)
GFR, Estimated: >60 mL/min (ref >60)
Glucose, Bld: 143 mg/dL — ABNORMAL HIGH (ref 70–99)
Potassium: 3.6 mmol/L (ref 3.5–5.1)
Sodium: 138 mmol/L (ref 135–145)
Total Bilirubin: 0.5 mg/dL (ref 0.0–1.2)
Total Protein: 7 g/dL (ref 6.5–8.1)

## 2023-11-09 LAB — CBC WITH DIFFERENTIAL/PLATELET
Abs Immature Granulocytes: 0.06 10*3/uL (ref 0.00–0.07)
Basophils Absolute: 0 10*3/uL (ref 0.0–0.1)
Basophils Relative: 0 %
Eosinophils Absolute: 0 10*3/uL (ref 0.0–0.5)
Eosinophils Relative: 0 %
HCT: 37.1 % (ref 36.0–46.0)
Hemoglobin: 12.4 g/dL (ref 12.0–15.0)
Immature Granulocytes: 1 %
Lymphocytes Relative: 7 %
Lymphs Abs: 0.8 10*3/uL (ref 0.7–4.0)
MCH: 30.8 pg (ref 26.0–34.0)
MCHC: 33.4 g/dL (ref 30.0–36.0)
MCV: 92.3 fL (ref 80.0–100.0)
Monocytes Absolute: 0.4 10*3/uL (ref 0.1–1.0)
Monocytes Relative: 3 %
Neutro Abs: 9.8 10*3/uL — ABNORMAL HIGH (ref 1.7–7.7)
Neutrophils Relative %: 89 %
Platelets: 211 10*3/uL (ref 150–400)
RBC: 4.02 MIL/uL (ref 3.87–5.11)
RDW: 14.6 % (ref 11.5–15.5)
WBC: 11 10*3/uL — ABNORMAL HIGH (ref 4.0–10.5)
nRBC: 0 % (ref 0.0–0.2)

## 2023-11-09 LAB — LACTIC ACID, PLASMA
Lactic Acid, Venous: 2.7 mmol/L (ref 0.5–1.9)
Lactic Acid, Venous: 1.8 mmol/L (ref 0.5–1.9)

## 2023-11-09 LAB — PROTIME-INR
INR: 1.2 (ref 0.8–1.2)
Prothrombin Time: 15.4 s — ABNORMAL HIGH (ref 11.4–15.2)

## 2023-11-09 LAB — RESP PANEL BY RT-PCR (RSV, FLU A&B, COVID)  RVPGX2
Influenza A by PCR: NEGATIVE
Influenza B by PCR: NEGATIVE
Resp Syncytial Virus by PCR: NEGATIVE
SARS Coronavirus 2 by RT PCR: NEGATIVE

## 2023-11-09 MED ORDER — ALBUTEROL SULFATE (2.5 MG/3ML) 0.083% IN NEBU
2.5000 mg | INHALATION_SOLUTION | Freq: Once | RESPIRATORY_TRACT | Status: AC
  Administered 2023-11-09 – 2023-11-10 (×2): 2.5 mg via RESPIRATORY_TRACT
  Filled 2023-11-09: qty 3

## 2023-11-09 MED ORDER — SODIUM CHLORIDE 0.9 % IV SOLN
100.0000 mg | Freq: Two times a day (BID) | INTRAVENOUS | Status: DC
  Administered 2023-11-10 – 2023-11-11 (×4): 100 mg via INTRAVENOUS
  Filled 2023-11-09 (×4): qty 100

## 2023-11-09 MED ORDER — METHYLPREDNISOLONE SODIUM SUCC 125 MG IJ SOLR
125.0000 mg | Freq: Once | INTRAMUSCULAR | Status: AC
  Administered 2023-11-09: 125 mg via INTRAVENOUS
  Filled 2023-11-09: qty 2

## 2023-11-09 MED ORDER — SODIUM CHLORIDE 0.9 % IV SOLN
2.0000 g | Freq: Once | INTRAVENOUS | Status: AC
  Administered 2023-11-09: 2 g via INTRAVENOUS
  Filled 2023-11-09: qty 20

## 2023-11-09 MED ORDER — IPRATROPIUM-ALBUTEROL 0.5-2.5 (3) MG/3ML IN SOLN
3.0000 mL | Freq: Once | RESPIRATORY_TRACT | Status: AC
  Administered 2023-11-09: 3 mL via RESPIRATORY_TRACT
  Filled 2023-11-09: qty 3

## 2023-11-09 MED ORDER — ACETAMINOPHEN 650 MG RE SUPP: 650.0000 mg | Freq: Four times a day (QID) | RECTAL | Status: DC | PRN

## 2023-11-09 MED ORDER — SODIUM CHLORIDE 0.9 % IV SOLN: INTRAVENOUS | Status: AC

## 2023-11-09 MED ORDER — ACETAMINOPHEN 325 MG PO TABS: 650.0000 mg | ORAL_TABLET | Freq: Four times a day (QID) | ORAL | Status: DC | PRN

## 2023-11-09 MED ORDER — ALBUTEROL SULFATE (2.5 MG/3ML) 0.083% IN NEBU
2.5000 mg | INHALATION_SOLUTION | Freq: Four times a day (QID) | RESPIRATORY_TRACT | Status: DC
  Administered 2023-11-10: 2.5 mg via RESPIRATORY_TRACT
  Filled 2023-11-09 (×2): qty 3

## 2023-11-09 MED ORDER — LACTATED RINGERS IV BOLUS (SEPSIS)
1000.0000 mL | Freq: Once | INTRAVENOUS | Status: AC
  Administered 2023-11-09: 1000 mL via INTRAVENOUS

## 2023-11-09 MED ORDER — LACTATED RINGERS IV SOLN: INTRAVENOUS | Status: DC

## 2023-11-09 MED ORDER — LEVALBUTEROL HCL 0.63 MG/3ML IN NEBU
0.6300 mg | INHALATION_SOLUTION | Freq: Four times a day (QID) | RESPIRATORY_TRACT | Status: DC | PRN
  Filled 2023-11-09: qty 3

## 2023-11-09 MED ORDER — IOHEXOL 350 MG/ML SOLN
65.0000 mL | Freq: Once | INTRAVENOUS | Status: AC | PRN
  Administered 2023-11-09: 65 mL via INTRAVENOUS

## 2023-11-09 MED ORDER — SODIUM CHLORIDE 0.9 % IV SOLN
500.0000 mg | Freq: Once | INTRAVENOUS | Status: AC
  Administered 2023-11-09: 500 mg via INTRAVENOUS
  Filled 2023-11-09: qty 5

## 2023-11-09 NOTE — ED Notes (Signed)
 RT Note: Patient was placed on 7L [place] due to low oxygen saturation on 80% on room air. A breathing treatment ordered verbally by physician

## 2023-11-09 NOTE — Sepsis Progress Note (Signed)
 eLink is following this Code Sepsis.

## 2023-11-09 NOTE — ED Provider Notes (Addendum)
 Supervised resident visit.  Patient comes in from facility with nonproductive cough.  Rectal temperature 100.8.  She is hypoxic to 85%.  She has increased work of breathing.  She has history of dementia A-fib on Eliquis , former smoker, history of bronchitis bronchiectasis.  Overall wheezing on exam.  Not typically on oxygen.  Will pursue sepsis workup given fever hypoxia and cough.  Suspicion for pneumonia.  Blood pressure unremarkable.  Will pursue sepsis workup with blood cultures IV antibiotics IV fluids and anticipate admission.  Please see resident note for further results, evaluation and disposition of the patient.  Per family patient is DNR/DNI.  CBC shows a white count of 11.  Otherwise lab works unremarkable.  Chest x-ray with no obvious pneumonia.  Could be viral process.  She is on anticoagulation and doubt PE.  She is wheezing on exam.  She has been given antibiotics steroids breathing treatment she stable on 4 L salter.  Ultimately patient is DNR/DNI.  Will admit for further respiratory care, sepsis workup.  She is mentating well.  This chart was dictated using voice recognition software.  Despite best efforts to proofread,  errors can occur which can change the documentation meaning.   .Critical Care  Performed by: Lowery Rue, DO Authorized by: Lowery Rue, DO   Critical care provider statement:    Critical care time (minutes):  40   Critical care was necessary to treat or prevent imminent or life-threatening deterioration of the following conditions:  Respiratory failure and sepsis   Critical care was time spent personally by me on the following activities:  Blood draw for specimens, development of treatment plan with patient or surrogate, discussions with primary provider, evaluation of patient's response to treatment, examination of patient, obtaining history from patient or surrogate, ordering and performing treatments and interventions, ordering and review of radiographic  studies, ordering and review of laboratory studies, pulse oximetry, re-evaluation of patient's condition and review of old charts   Care discussed with: admitting provider       Lowery Rue, DO 11/09/23 1736    Lowery Rue, DO 11/09/23 1844

## 2023-11-09 NOTE — Progress Notes (Signed)
 Plan of Care Note for accepted transfer   Patient: [name] MRN: [id number]   DOA: 11/09/2023  Facility requesting transfer: [facility]   Requesting Provider: Dr. Colonel Dears   Reason for transfer: Acute hypoxic respiratory failure   Facility course: 89 yr old female with dementia and PAF on Eliquis  who presents with non-productive cough and is found to be saturating in mid-80s on rm air.   No acute findings noted on CXR. Lactate is 2.7 and WBC 11.0.   She was given IV Solu-Medrol, 1 liter LR, Rocpehin, azithromycin, and neb treatments. CTA chest is ordered.   Plan of care: The patient is accepted for admission to Progressive unit, at [facility].   Author: Walton Guppy, MD 11/09/2023  Check [email or web address] for on-call coverage.  Nursing staff, Please call [facility] Admits & Consults System-Wide number on Amion as soon as patient's arrival, so appropriate admitting provider can evaluate the pt.

## 2023-11-09 NOTE — Sepsis Progress Note (Signed)
 Notified bedside nurse of need to draw repeat lactic acid.

## 2023-11-09 NOTE — ED Provider Notes (Signed)
 [place] [facility] AT [facility] Provider Note  CSN: [id number] Arrival date & time: 11/09/23 1716  Chief Complaint(s) Respiratory Distress  HPI [name] is a 89 y.o. female with PMHx A-fib on chronic anticoagulation, bronchiectasis, COPD, GERD, dementia, hearing loss, PVCs presenting with shortness of breath.  Daughter provides majority of history as patient has significant dementia and only oriented to self at baseline. Presents from [facility] with acute shortness of breath and deep cough.  Ongoing cough for the past couple of days.  Poor PO intake today. H/o bronchiectasis and COPD seen by pulmonology, has been stable on home albuterol  as needed. H/o A-fib on Eliquis , has not missed any medication doses per daughter.  Wheelchair-bound and full assist with ADLs at baseline.  Denies recent fever or sick contacts including infectious outbreak and nursing home.  Past Medical History Past Medical History:  Diagnosis Date   A-fib (HCC)    Bronchiectasis (HCC)    with multiple pneumonia back in 1990's   Bronchitis    episodic   DJD (degenerative joint disease) of knee    GERD (gastroesophageal reflux disease)    episodic symptoms   Hearing loss    chronic moderate hearing loss, worked up by ENT and hearing aids have been recommended   History of nuclear stress test 03/29/2006   NORMAL   Insomnia    PVC's (premature ventricular contractions)    on EKG, controlled on atenolol and caffeine reduction   Tinnitus    chronic   Patient Active Problem List   Diagnosis Date Noted   Acute respiratory failure with hypoxia (HCC) 11/09/2023   Hypercoagulable state due to permanent atrial fibrillation (HCC) 08/13/2023   Protein-calorie malnutrition, severe 08/01/2023   Closed fracture of hip, left, initial encounter (HCC) 07/31/2023   QT prolongation 07/31/2023   Dementia 07/31/2023   Varicose vein of leg 11/02/2016   Anticoagulated 05/02/2016   Dyspnea  11/04/2015   Lower extremity edema 10/15/2015   Atrial fibrillation with RVR (HCC)    Atrial fibrillation with rapid ventricular response (HCC) 07/02/2015   Paroxysmal atrial fibrillation (HCC) 07/02/2015   Bronchiectasis (HCC) 02/22/2015   Pseudomonas aeruginosa colonization 02/22/2015   COPD mixed type (HCC) 12/28/2014   DOE (dyspnea on exertion) 12/28/2014   Palpitations 09/11/2013   Supraventricular premature beats 07/18/2013   Occlusion and stenosis of carotid artery without mention of cerebral infarction 07/18/2013   Nuclear cataract 07/07/2011   Home Medication(s) Prior to Admission medications   Medication Sig Start Date End Date Taking? Authorizing Provider  acetaminophen  (TYLENOL ) 500 MG tablet Take 1,000 mg by mouth every 8 (eight) hours as needed for moderate pain (pain score 4-6) or mild pain (pain score 1-3).    [provider]  apixaban  (ELIQUIS ) 2.5 MG TABS tablet TAKE 1 TABLET(2.5 MG) BY MOUTH TWICE DAILY 06/01/22   Debbie Fails, PA-C  Cholecalciferol  (VITAMIN D3) 2000 units TABS Take 2,000 Units by mouth daily.    [provider]  digoxin  (LANOXIN ) 0.125 MG tablet Take 1 tablet (0.125 mg total) by mouth daily. 08/08/23   Danford, Willis Harter, MD  diltiazem  (CARDIZEM  CD) 240 MG 24 hr capsule Take 1 capsule (240 mg total) by mouth daily. 08/08/23   Danford, Willis Harter, MD  ipratropium-albuterol  (DUONEB) 0.5-2.5 (3) MG/3ML SOLN Take 3 mLs by nebulization every 6 (six) hours as needed. Patient taking differently: Take 3 mLs by nebulization every 6 (six) hours as needed (wheezing/SOB). 05/18/23   Roetta Clarke, NP  sennosides-docusate sodium  (SENOKOT-S) 8.6-50 MG tablet Take 1 tablet by mouth in the morning and at bedtime.    [provider]  sertraline  (ZOLOFT ) 50 MG tablet Take 50 mg by mouth daily.    [provider]                                                                                                                                     Past Surgical History Past Surgical History:  Procedure Laterality Date   ANTERIOR APPROACH HEMI HIP ARTHROPLASTY Left 08/01/2023   Procedure: HEMIARTHROPLASTY, HIP, DIRECT ANTERIOR APPROACH, FOR FRACTURE;  Surgeon: Adonica Hoose, MD;  Location: [facility] ORS;  Service: Orthopedics;  Laterality: Left;   BREAST EXCISIONAL BIOPSY Left    CARDIOVERSION N/A 08/31/2015   Procedure: CARDIOVERSION;  Surgeon: Hugh Madura, MD;  Location: [facility] ENDOSCOPY;  Service: Cardiovascular;  Laterality: N/A;   CARDIOVERSION N/A 02/28/2023   Procedure: CARDIOVERSION;  Surgeon: Euell Herrlich, MD;  Location: [facility] INVASIVE CV LAB;  Service: Cardiovascular;  Laterality: N/A;   cyst removed     left breast   Family History Family History  Problem Relation Age of Onset   Stroke Father    Hypertension Father    Stroke Mother    Hypertension Mother    Asthma Sister    Clotting disorder Sister    Heart attack Neg Hx     Social History Social History   Tobacco Use   Smoking status: Former    Current packs/day: 0.00    Average packs/day: 0.5 packs/day for 20.0 years (10.0 ttl pk-yrs)    Types: Cigarettes    Start date: 05/29/1948    Quit date: 05/29/1968    Years since quitting: 55.4   Smokeless tobacco: Never   Tobacco comments:    Former smoker 08/13/23  Vaping Use   Vaping status: Never Used  Substance Use Topics   Alcohol use: Yes    Alcohol/week: 0.0 standard drinks of alcohol    Comment: 1 can of beer at night   Drug use: No   Allergies Augmentin [amoxicillin-pot clavulanate], Biaxin [clarithromycin], Metoprolol , Other, and Tequin [gatifloxacin]  Review of Systems A thorough review of systems was obtained and all systems are negative except as noted in the HPI and PMH.   Physical Exam Vital Signs  I have reviewed the triage vital signs BP (!) 121/58   Pulse 73   Temp (!) 100.8 F (38.2 C) (Rectal)   Resp (!) 22   Ht 5' (1.524 m)   Wt 39.5 kg   SpO2 98%   BMI 16.99 kg/m   Physical Exam General: Cachectic appearing, sitting up in bed.  Minimally responsive to questions. Eyes: PERRLA, anicteric sclera ENTM: Dry mucous membranes with yellow Tylenol .  No palpable cervical lymphadenopathy. Neck: Supple, non-tender Cardiovascular: RRR without murmur Respiratory: Deep rhonchorous cough.  Diminished breath sounds at bases worse on left side.  Mildly labored  breathing on 6 L [place].  Initially hypoxic with O2 sat in the 80s. Gastrointestinal: Soft, non-tender, non-distended MSK: No peripheral edema Derm: Warm, dry, no rashes noted Neuro: Oriented only to self.  Moves all extremities spontaneously.  ED Results and Treatments Labs (all labs ordered are listed, but only abnormal results are displayed) Labs Reviewed  LACTIC ACID, PLASMA - Abnormal; Notable for the following components:      Result Value   Lactic Acid, Venous 2.7 (*)    All other components within normal limits  COMPREHENSIVE METABOLIC PANEL WITH GFR - Abnormal; Notable for the following components:   Chloride 95 (*)    Glucose, Bld 143 (*)    Anion gap 16 (*)    All other components within normal limits  CBC WITH DIFFERENTIAL/PLATELET - Abnormal; Notable for the following components:   WBC 11.0 (*)    Neutro Abs 9.8 (*)    All other components within normal limits  PROTIME-INR - Abnormal; Notable for the following components:   Prothrombin Time 15.4 (*)    All other components within normal limits  RESP PANEL BY RT-PCR (RSV, FLU A&B, COVID)  RVPGX2  CULTURE, BLOOD (ROUTINE X 2)  CULTURE, BLOOD (ROUTINE X 2)  LACTIC ACID, PLASMA  URINALYSIS, W/ REFLEX TO CULTURE (INFECTION SUSPECTED)                                                                                                                          Radiology DG Chest Portable 1 View Result Date: 11/09/2023 CLINICAL DATA:  Shortness of breath EXAM: PORTABLE CHEST 1 VIEW COMPARISON:  10/23/2023, 07/30/2023, 04/03/2019 FINDINGS: Chronic  pleuroparenchymal scarring at the apices. Evidence for chronic lung disease with diffuse coarse interstitial opacity. No acute confluent airspace disease or effusion. Stable cardiomediastinal silhouette with aortic atherosclerosis. Multiple skin fold artifact over the left chest IMPRESSION: No active disease. Evidence for chronic lung disease. Electronically Signed   By: Esmeralda Hedge M.D.   On: 11/09/2023 18:26    Pertinent labs & imaging results that were available during my care of the patient were reviewed by me and considered in my medical decision making (see MDM for details).  Medications Ordered in ED Medications  lactated ringers  infusion (has no administration in time range)  lactated ringers  bolus 1,000 mL (0 mLs Intravenous Stopped 11/09/23 1911)  cefTRIAXone  (ROCEPHIN ) 2 g in sodium chloride  0.9 % 100 mL IVPB (0 g Intravenous Stopped 11/09/23 1910)  azithromycin (ZITHROMAX) 500 mg in sodium chloride  0.9 % 250 mL IVPB (0 mg Intravenous Stopped 11/09/23 1910)  ipratropium-albuterol  (DUONEB) 0.5-2.5 (3) MG/3ML nebulizer solution 3 mL (3 mLs Nebulization Given 11/09/23 1757)  albuterol  (PROVENTIL ) (2.5 MG/3ML) 0.083% nebulizer solution 2.5 mg (2.5 mg Nebulization Given 11/09/23 1757)  methylPREDNISolone sodium succinate (SOLU-MEDROL) 125 mg/2 mL injection 125 mg (125 mg Intravenous Given 11/09/23 1803)  Medical Decision Making / ED Course    Medical Decision Making:    BREEAN NANNINI is a 89 y.o. female  with PMHx A-fib on chronic anticoagulation, bronchiectasis, COPD, GERD, dementia, hearing loss, PVCs presenting with shortness of breath. The complaint involves an extensive differential diagnosis and also carries with it a high risk of complications and morbidity.  Serious etiology was considered. Ddx includes but is not limited to: Pneumonia, COPD  exacerbation, bronchiectasis, pneumothorax, heart failure exacerbation, PE  Complete initial physical exam performed, notably the patient had mildly laboriously breathing on 6L [place]. Reviewed and confirmed nursing documentation for past medical history, family history, social history.  Vital signs reviewed.     Brief summary:  89 year old female with history as above presents with hypoxia to 85% and laborious breathing that improved on 6 L [place].  Rectal temp 100.8, initiated sepsis protocol.  Started on CAP treatment as presumably respiratory source given hypoxia.  Meets criteria for COPD exacerbation, will treat with steroids and subsequent breathing treatments.  EKG showed permanent A-fib with new bigeminy likely secondary to underlying septic process.  Will need admission for new oxygen requirement.   Clinical Course as of 11/09/23 1947  Fri Nov 09, 2023  1909 Viral testing negative.  Leukocytosis to 11k.  CXR negative for pneumonia.  Presentation most likely secondary to viral process.  Will admit to hospitalist. [KH]  1946 Will obtain CT PE and admit to hospital service. [KH]    Clinical Course User Index [KH] Jonne Netters, MD   Additional history obtained: -Additional history obtained from family -External records from outside source obtained and reviewed including: Chart review including previous notes, labs, imaging, consultation notes including:   Lab Tests: -I ordered, reviewed, and interpreted labs.   The pertinent results include:   Labs Reviewed  LACTIC ACID, PLASMA - Abnormal; Notable for the following components:      Result Value   Lactic Acid, Venous 2.7 (*)    All other components within normal limits  COMPREHENSIVE METABOLIC PANEL WITH GFR - Abnormal; Notable for the following components:   Chloride 95 (*)    Glucose, Bld 143 (*)    Anion gap 16 (*)    All other components within normal limits  CBC WITH DIFFERENTIAL/PLATELET - Abnormal; Notable for the  following components:   WBC 11.0 (*)    Neutro Abs 9.8 (*)    All other components within normal limits  PROTIME-INR - Abnormal; Notable for the following components:   Prothrombin Time 15.4 (*)    All other components within normal limits  RESP PANEL BY RT-PCR (RSV, FLU A&B, COVID)  RVPGX2  CULTURE, BLOOD (ROUTINE X 2)  CULTURE, BLOOD (ROUTINE X 2)  LACTIC ACID, PLASMA  URINALYSIS, W/ REFLEX TO CULTURE (INFECTION SUSPECTED)    EKG   EKG Interpretation Date/Time:  Friday November 09 2023 17:35:08 EDT Ventricular Rate:  113 PR Interval:    QRS Duration:  96 QT Interval:  291 QTC Calculation: 325 R Axis:   231  Text Interpretation: Atrial fibrillation Ventricular bigeminy Anterolateral infarct, age indeterminate Confirmed by Lowery Rue [phone]) on 11/09/2023 5:42:25 PM         Imaging Studies ordered: I ordered imaging studies including CXR I independently visualized the following imaging with scope of interpretation limited to determining acute life threatening conditions related to emergency care; findings noted above I agree with the radiologist interpretation If any imaging was obtained with contrast I closely monitored patient for any possible adverse  reaction a/w contrast administration in the emergency department   Medicines ordered and prescription drug management: Meds ordered this encounter  Medications   lactated ringers  infusion   lactated ringers  bolus 1,000 mL    Reason 30 mL/kg dose is not being ordered:   First Lactic Acid Pending   cefTRIAXone  (ROCEPHIN ) 2 g in sodium chloride  0.9 % 100 mL IVPB    Antibiotic Indication::   CAP   azithromycin (ZITHROMAX) 500 mg in sodium chloride  0.9 % 250 mL IVPB    Antibiotic Indication::   CAP   ipratropium-albuterol  (DUONEB) 0.5-2.5 (3) MG/3ML nebulizer solution 3 mL   albuterol  (PROVENTIL ) (2.5 MG/3ML) 0.083% nebulizer solution 2.5 mg   methylPREDNISolone sodium succinate (SOLU-MEDROL) 125 mg/2 mL injection 125 mg     IV methylprednisolone will be converted to either a q12h or q24h frequency with the same total daily dose (TDD).  Ordered Dose: 1 to 125 mg TDD; convert to: TDD q24h.  Ordered Dose: 126 to 250 mg TDD; convert to: TDD div q12h.  Ordered Dose: >250 mg TDD; DAW.    -I have reviewed the patients home medicines and have made adjustments as needed   Consultations Obtained: I requested consultation with the hospitalist, and discussed lab and imaging findings as well as pertinent plan - they recommend admission   Cardiac Monitoring: The patient was maintained on a cardiac monitor.  I personally viewed and interpreted the cardiac monitored which showed an underlying rhythm of: A-fib, bigeminy Continuous pulse oximetry interpreted by myself, 85% % on RA, placed on 6 L [place].   Reevaluation: After the interventions noted above, I reevaluated the patient and found that they have improved  Co morbidities that complicate the patient evaluation  Past Medical History:  Diagnosis Date   A-fib (HCC)    Bronchiectasis (HCC)    with multiple pneumonia back in 1990's   Bronchitis    episodic   DJD (degenerative joint disease) of knee    GERD (gastroesophageal reflux disease)    episodic symptoms   Hearing loss    chronic moderate hearing loss, worked up by ENT and hearing aids have been recommended   History of nuclear stress test 03/29/2006   NORMAL   Insomnia    PVC's (premature ventricular contractions)    on EKG, controlled on atenolol and caffeine reduction   Tinnitus    chronic      Dispostion: Disposition decision including need for hospitalization was considered, and patient admitted to the hospital.    Final Clinical Impression(s) / ED Diagnoses Final diagnoses:  Sepsis, due to unspecified organism, unspecified whether acute organ dysfunction present [facility])        Jonne Netters, MD 11/09/23 1947    Lowery Rue, DO 11/09/23 1959

## 2023-11-09 NOTE — ED Notes (Signed)
 RT Note: Patient is currently tolerating 4lpm [place] with an SPO2 97%.

## 2023-11-09 NOTE — ED Triage Notes (Signed)
 Per pt daughter, pt has come in from SNF w/ raspy voice and non-productive cough. Site staff at SNF report cough for several days. Pt has hx of dementia.

## 2023-11-10 DIAGNOSIS — J441 Chronic obstructive pulmonary disease with (acute) exacerbation: Secondary | ICD-10-CM | POA: Diagnosis not present

## 2023-11-10 DIAGNOSIS — J9601 Acute respiratory failure with hypoxia: Secondary | ICD-10-CM | POA: Diagnosis not present

## 2023-11-10 DIAGNOSIS — D696 Thrombocytopenia, unspecified: Secondary | ICD-10-CM

## 2023-11-10 DIAGNOSIS — J122 Parainfluenza virus pneumonia: Secondary | ICD-10-CM

## 2023-11-10 DIAGNOSIS — D649 Anemia, unspecified: Secondary | ICD-10-CM

## 2023-11-10 DIAGNOSIS — J189 Pneumonia, unspecified organism: Secondary | ICD-10-CM | POA: Diagnosis not present

## 2023-11-10 LAB — RESPIRATORY PANEL BY PCR

## 2023-11-10 LAB — COMPREHENSIVE METABOLIC PANEL WITH GFR
ALT: 11 U/L (ref 0–44)
ALT: 11 U/L (ref 0–44)
AST: 20 U/L (ref 15–41)
AST: 19 U/L (ref 15–41)
Albumin: 2.9 g/dL — ABNORMAL LOW (ref 3.5–5.0)
Albumin: 2.6 g/dL — ABNORMAL LOW (ref 3.5–5.0)
Alkaline Phosphatase: 65 U/L (ref 38–126)
Alkaline Phosphatase: 64 U/L (ref 38–126)
Anion gap: 11 (ref 5–15)
Anion gap: 10 (ref 5–15)
BUN: 13 mg/dL (ref 8–23)
BUN: 13 mg/dL (ref 8–23)
CO2: 29 mmol/L (ref 22–32)
CO2: 29 mmol/L (ref 22–32)
Calcium: 8.5 mg/dL — ABNORMAL LOW (ref 8.9–10.3)
Calcium: 7.9 mg/dL — ABNORMAL LOW (ref 8.9–10.3)
Chloride: 99 mmol/L (ref 98–111)
Chloride: 99 mmol/L (ref 98–111)
Creatinine, Ser: 0.6 mg/dL (ref 0.44–1.00)
Creatinine, Ser: 0.4 mg/dL — ABNORMAL LOW (ref 0.44–1.00)
GFR, Estimated: >60 mL/min (ref >60)
GFR, Estimated: >60 mL/min (ref >60)
Glucose, Bld: 138 mg/dL — ABNORMAL HIGH (ref 70–99)
Glucose, Bld: 107 mg/dL — ABNORMAL HIGH (ref 70–99)
Potassium: 3.2 mmol/L — ABNORMAL LOW (ref 3.5–5.1)
Potassium: 3.1 mmol/L — ABNORMAL LOW (ref 3.5–5.1)
Sodium: 139 mmol/L (ref 135–145)
Sodium: 138 mmol/L (ref 135–145)
Total Bilirubin: 0.9 mg/dL (ref 0.0–1.2)
Total Bilirubin: 0.8 mg/dL (ref 0.0–1.2)
Total Protein: 5.8 g/dL — ABNORMAL LOW (ref 6.5–8.1)
Total Protein: 5.6 g/dL — ABNORMAL LOW (ref 6.5–8.1)

## 2023-11-10 LAB — TSH: TSH: 1.071 u[IU]/mL (ref 0.350–4.500)

## 2023-11-10 LAB — CBC
HCT: 33.9 % — ABNORMAL LOW (ref 36.0–46.0)
Hemoglobin: 10.9 g/dL — ABNORMAL LOW (ref 12.0–15.0)
MCH: 29.8 pg (ref 26.0–34.0)
MCHC: 32.2 g/dL (ref 30.0–36.0)
MCV: 92.6 fL (ref 80.0–100.0)
Platelets: 147 10*3/uL — ABNORMAL LOW (ref 150–400)
RBC: 3.66 MIL/uL — ABNORMAL LOW (ref 3.87–5.11)
RDW: 14.6 % (ref 11.5–15.5)
WBC: 5.6 10*3/uL (ref 4.0–10.5)
nRBC: 0 % (ref 0.0–0.2)

## 2023-11-10 LAB — HEMOGLOBIN A1C
Hgb A1c MFr Bld: 5.2 % (ref 4.8–5.6)
Mean Plasma Glucose: 102.54 mg/dL

## 2023-11-10 LAB — CBC WITH DIFFERENTIAL/PLATELET
Abs Immature Granulocytes: 0.02 10*3/uL (ref 0.00–0.07)
Basophils Absolute: 0 10*3/uL (ref 0.0–0.1)
Basophils Relative: 0 %
Eosinophils Absolute: 0 10*3/uL (ref 0.0–0.5)
Eosinophils Relative: 0 %
HCT: 34.9 % — ABNORMAL LOW (ref 36.0–46.0)
Hemoglobin: 10.6 g/dL — ABNORMAL LOW (ref 12.0–15.0)
Immature Granulocytes: 0 %
Lymphocytes Relative: 7 %
Lymphs Abs: 0.3 10*3/uL — ABNORMAL LOW (ref 0.7–4.0)
MCH: 28.8 pg (ref 26.0–34.0)
MCHC: 30.4 g/dL (ref 30.0–36.0)
MCV: 94.8 fL (ref 80.0–100.0)
Monocytes Absolute: 0.1 10*3/uL (ref 0.1–1.0)
Monocytes Relative: 2 %
Neutro Abs: 4.6 10*3/uL (ref 1.7–7.7)
Neutrophils Relative %: 91 %
Platelets: 146 10*3/uL — ABNORMAL LOW (ref 150–400)
RBC: 3.68 MIL/uL — ABNORMAL LOW (ref 3.87–5.11)
RDW: 14.6 % (ref 11.5–15.5)
WBC: 5 10*3/uL (ref 4.0–10.5)
nRBC: 0 % (ref 0.0–0.2)

## 2023-11-10 LAB — GLUCOSE, CAPILLARY
Glucose-Capillary: 116 mg/dL — ABNORMAL HIGH (ref 70–99)
Glucose-Capillary: 141 mg/dL — ABNORMAL HIGH (ref 70–99)
Glucose-Capillary: 116 mg/dL — ABNORMAL HIGH (ref 70–99)
Glucose-Capillary: 131 mg/dL — ABNORMAL HIGH (ref 70–99)

## 2023-11-10 LAB — PROCALCITONIN: Procalcitonin: 0.49 ng/mL

## 2023-11-10 LAB — BRAIN NATRIURETIC PEPTIDE: B Natriuretic Peptide: 508 pg/mL — ABNORMAL HIGH (ref 0.0–100.0)

## 2023-11-10 MED ORDER — IPRATROPIUM BROMIDE 0.02 % IN SOLN
0.5000 mg | Freq: Four times a day (QID) | RESPIRATORY_TRACT | Status: DC
  Administered 2023-11-11 (×2): 0.5 mg via RESPIRATORY_TRACT
  Filled 2023-11-10 (×2): qty 2.5

## 2023-11-10 MED ORDER — GUAIFENESIN ER 600 MG PO TB12
1200.0000 mg | ORAL_TABLET | Freq: Two times a day (BID) | ORAL | Status: DC
  Administered 2023-11-10 (×2): 1200 mg via ORAL
  Filled 2023-11-10 (×3): qty 2

## 2023-11-10 MED ORDER — BUDESONIDE 0.25 MG/2ML IN SUSP
0.2500 mg | Freq: Two times a day (BID) | RESPIRATORY_TRACT | Status: DC
  Administered 2023-11-10 – 2023-11-12 (×4): 0.25 mg via RESPIRATORY_TRACT
  Filled 2023-11-10 (×4): qty 2

## 2023-11-10 MED ORDER — LEVALBUTEROL HCL 0.63 MG/3ML IN NEBU
0.6300 mg | INHALATION_SOLUTION | Freq: Four times a day (QID) | RESPIRATORY_TRACT | Status: DC
  Administered 2023-11-10 (×2): 0.63 mg via RESPIRATORY_TRACT
  Filled 2023-11-10 (×2): qty 3

## 2023-11-10 MED ORDER — SODIUM CHLORIDE 0.9 % IV SOLN: INTRAVENOUS | Status: DC

## 2023-11-10 MED ORDER — LEVALBUTEROL HCL 0.63 MG/3ML IN NEBU
0.6300 mg | INHALATION_SOLUTION | Freq: Four times a day (QID) | RESPIRATORY_TRACT | Status: DC
  Administered 2023-11-11 (×2): 0.63 mg via RESPIRATORY_TRACT
  Filled 2023-11-10 (×2): qty 3

## 2023-11-10 MED ORDER — ARFORMOTEROL TARTRATE 15 MCG/2ML IN NEBU
15.0000 ug | INHALATION_SOLUTION | Freq: Two times a day (BID) | RESPIRATORY_TRACT | Status: DC
  Administered 2023-11-10 – 2023-11-12 (×4): 15 ug via RESPIRATORY_TRACT
  Filled 2023-11-10 (×4): qty 2

## 2023-11-10 MED ORDER — METHYLPREDNISOLONE SODIUM SUCC 40 MG IJ SOLR
40.0000 mg | Freq: Two times a day (BID) | INTRAMUSCULAR | Status: DC
  Administered 2023-11-10 – 2023-11-11 (×3): 40 mg via INTRAVENOUS
  Filled 2023-11-10 (×3): qty 1

## 2023-11-10 MED ORDER — APIXABAN 2.5 MG PO TABS
2.5000 mg | ORAL_TABLET | Freq: Two times a day (BID) | ORAL | Status: DC
  Administered 2023-11-10 – 2023-11-11 (×3): 2.5 mg via ORAL
  Filled 2023-11-10 (×3): qty 1

## 2023-11-10 MED ORDER — SODIUM CHLORIDE 0.9 % IV SOLN
2.0000 g | INTRAVENOUS | Status: DC
  Administered 2023-11-10: 2 g via INTRAVENOUS
  Filled 2023-11-10 (×2): qty 20

## 2023-11-10 MED ORDER — POTASSIUM CHLORIDE CRYS ER 20 MEQ PO TBCR
40.0000 meq | EXTENDED_RELEASE_TABLET | Freq: Three times a day (TID) | ORAL | Status: AC
  Administered 2023-11-10 (×2): 40 meq via ORAL
  Filled 2023-11-10 (×2): qty 2

## 2023-11-10 MED ORDER — PANTOPRAZOLE SODIUM 40 MG PO TBEC
40.0000 mg | DELAYED_RELEASE_TABLET | Freq: Every day | ORAL | Status: DC
  Administered 2023-11-10: 40 mg via ORAL
  Filled 2023-11-10 (×2): qty 1

## 2023-11-10 MED ORDER — IPRATROPIUM BROMIDE 0.02 % IN SOLN
0.5000 mg | Freq: Four times a day (QID) | RESPIRATORY_TRACT | Status: DC
  Administered 2023-11-10 (×2): 0.5 mg via RESPIRATORY_TRACT
  Filled 2023-11-10 (×2): qty 2.5

## 2023-11-10 NOTE — Hospital Course (Signed)
 The patient is an elderly 89 year old Caucasian female with past medical history significant for atrial fibrillation on anticoagulation, bronchiectasis, COPD, GERD, dementia and hearing loss, history of PVCs presented the ED with worsening cough and progressive shortness of breath for the last few days.  Patient is pleasantly demented and was unable to give a subjective history but it was noted that she had no pain and had some nausea and but no diarrhea.  She is also noted of poor p.o. intake and lives in a facility at [facility].  Upon arrival she had a temperature of 100.8 and O2 saturation of 85% on room air.  Further workup was done and CT chest PE done and showed no evidence of pulm embolus but did show diffuse bilateral tree-in-bud opacities consistent with laboratory changes with MAC being considered and chronic scarring at the apices.  Respiratory virus panel was positive for parainfluenza virus.  She was placed on nebs, oxygen, antibiotics but then clinically decompensated and was moved to the stepdown unit.  Patient went into A-fib with RVR and heart rates were difficult to control.  She was placed on Cardizem  short-term however became hypotensive.  The case was discussed with cardiology who recommended keeping her running tach eager and they would see the patient tomorrow but recommended p.o. control given her multiple failures of flecainide  and amiodarone .  Echocardiogram was done and showed an EF of 25 to 30%.  Given her continued worsening and decompensation the case was discussed with the patient's daughter and the daughter elected for comfort measures.  All medications on patient's comfort were discontinued.  Patient is at high risk for further decompensation and and possible in-hospital death and referral been made to residential hospice.  Assessment and Plan:  Acute Respiratory Failure with Hypoxia in the setting of Bronchiectasis and CAP with Parainfluenza virus ruling out bacterial  secondary infection /acute exacerbation of COPD: CTA done and as above showed no PE but did show diffuse bilateral tree-in-bud opacities consistent with possible MAI and chronic scarring.  Continue with pulmonary toileting and add guaifenesin 1200 mL p.o. twice daily, flutter valve, suggest pulmonary.  Continuing antibiotics and she has been changed to ceftriaxone  and doxycycline for now.  Continue with Xopenex /Atrovent .  Repeat chest x-ray in the AM.  She will need an amatory home O2 prior to discharge.  Continue supplemental oxygen via nasal cannula.  Getting Solu-Medrol and received Solu-Medrol 125 mg x 1 and now getting 40 every 12 however all medications now been discontinued that are not in the patient's comfort given that she is being transitioned to comfort care.  Paroxysmal Atrial Fibrillation now with RVR: Resume anticoagulation with Apixaban .  TSH was 1.071.  Resumed her home digoxin  and Cardizem  but became extremely tachycardic with sustained rates in the 150s and 60s.  Cardizem  drip was initiated however had to be held given her hypotension.  Echocardiogram was done and showed an EF of 25 to 30% she was transferred to the stepdown unit and cardiologist was, however after further goals of care discussion decision was made to transition to comfort care.  All medications not the patient's comfort have been discontinued.  GERD/GI prophylaxis: Continue PPI w/ Pantoprazole   Hypokalemia: Mild and K+ is now 3.5.  Will not continue to monitor and replete given that we will transition her to comfort care.  Dementia: Will place on delirium precaution; was having some agitation  Normocytic Anemia: Hgb/Hct went from 12.4/37.1 -> 10.9/33.9 -> 10.6/34.9 is now 10.3/34.0. Checked Anemia Panel and  showed an iron level of 33, UIBC was 87, TIBC 120, saturation was low at 27%, ferritin level 313, folate levels 4.9 and vitamin B12 was 230.  CTM for S/Sx of bleeding; No overt bleeding noted.  Will not repeat CBC  given transition to comfort care  Thrombocytopenia: Plt Count went from 211 -> 147 -> 146 and is now 163. CTM for S/Sx of Bleeding; No overt bleeding noted.  Will repeat CBC given transition to comfort care  Hypoalbuminemia: Patient's Albumin Level went from 3.1 -> 3.7 -> 2.9 -> 2.6 and is now 2.5. CTM and Trend and repeat CMP in the AM

## 2023-11-10 NOTE — H&P (Addendum)
 History and Physical    [name] ZOX:[id number] DOB: Oct 16, 1930 DOA: 11/09/2023  PCP: Tena Feeling, MD  Patient coming from: [facility]  I have personally briefly reviewed patient's old medical records in [facility]  Chief Complaint: cough  and raspy voice x few days  HPI: [name] is a 89 y.o. female with medical history significant of   A-fib on chronic anticoagulation, bronchiectasis, COPD, GERD, dementia, hearing loss, PVCs who presents to ED with cough and progressive sob over the last few days.  Of note patient is unable to give full history, ros patient notes no pain, does say yes to nausea but no to diarrhea,. Patient of note at baseline is alert to self.  Per chart  patient was noted to have poor po intake  associated with acute illness. Per chart no sick contacts.   ED Course:   Temp 100.8, bp , hr 73, rr 22 , sat 85 % on ra  CTX: NAD EKG: Atrial fib with ventricular bigeminy  Wbc: 11, hgb 12.4, plt 211 Na 138, K 3.6, CL 95, glu 143  cr 0.62 RVP:neg  Lactic 2.7  Tx LR1 , douneb , CTX 2gram, solumedrol , zithromax   CTPE IMPRESSION: No evidence of pulmonary emboli.   Diffuse bilateral tree in bud opacities consistent with inflammatory change. MAI deserves consideration.   Chronic scarring in the apices bilaterally.   Aortic Atherosclerosis (ICD10-I70.0).  Review of Systems: As per HPI otherwise 10 point review of systems negative.   Past Medical History:  Diagnosis Date   A-fib (HCC)    Bronchiectasis (HCC)    with multiple pneumonia back in 1990's   Bronchitis    episodic   DJD (degenerative joint disease) of knee    GERD (gastroesophageal reflux disease)    episodic symptoms   Hearing loss    chronic moderate hearing loss, worked up by ENT and hearing aids have been recommended   History of nuclear stress test 03/29/2006   NORMAL   Insomnia    PVC's (premature ventricular contractions)    on EKG, controlled on atenolol and  caffeine reduction   Tinnitus    chronic    Past Surgical History:  Procedure Laterality Date   ANTERIOR APPROACH HEMI HIP ARTHROPLASTY Left 08/01/2023   Procedure: HEMIARTHROPLASTY, HIP, DIRECT ANTERIOR APPROACH, FOR FRACTURE;  Surgeon: Adonica Hoose, MD;  Location: [facility] ORS;  Service: Orthopedics;  Laterality: Left;   BREAST EXCISIONAL BIOPSY Left    CARDIOVERSION N/A 08/31/2015   Procedure: CARDIOVERSION;  Surgeon: Hugh Madura, MD;  Location: [facility] ENDOSCOPY;  Service: Cardiovascular;  Laterality: N/A;   CARDIOVERSION N/A 02/28/2023   Procedure: CARDIOVERSION;  Surgeon: Euell Herrlich, MD;  Location: [facility] INVASIVE CV LAB;  Service: Cardiovascular;  Laterality: N/A;   cyst removed     left breast     reports that she quit smoking about 55 years ago. Her smoking use included cigarettes. She started smoking about 75 years ago. She has a 10 pack-year smoking history. She has never used smokeless tobacco. She reports current alcohol use. She reports that she does not use drugs.  Allergies  Allergen Reactions   Augmentin [Amoxicillin-Pot Clavulanate] Nausea And Vomiting   Biaxin [Clarithromycin] Nausea And Vomiting   Metoprolol  Other (See Comments)    Patient notes she feels funny after taking it   Other Other (See Comments)    Pollen  Reaction unknown    Tequin [Gatifloxacin] Nausea Only    Family  History  Problem Relation Age of Onset   Stroke Father    Hypertension Father    Stroke Mother    Hypertension Mother    Asthma Sister    Clotting disorder Sister    Heart attack Neg Hx     Prior to Admission medications   Medication Sig Start Date End Date Taking? Authorizing Provider  acetaminophen  (TYLENOL ) 500 MG tablet Take 1,000 mg by mouth every 8 (eight) hours as needed for moderate pain (pain score 4-6) or mild pain (pain score 1-3).    [provider]  apixaban  (ELIQUIS ) 2.5 MG TABS tablet TAKE 1 TABLET(2.5 MG) BY MOUTH TWICE DAILY 06/01/22   Debbie Fails,  PA-C  Cholecalciferol  (VITAMIN D3) 2000 units TABS Take 2,000 Units by mouth daily.    [provider]  digoxin  (LANOXIN ) 0.125 MG tablet Take 1 tablet (0.125 mg total) by mouth daily. 08/08/23   Danford, Willis Harter, MD  diltiazem  (CARDIZEM  CD) 240 MG 24 hr capsule Take 1 capsule (240 mg total) by mouth daily. 08/08/23   Danford, Willis Harter, MD  ipratropium-albuterol  (DUONEB) 0.5-2.5 (3) MG/3ML SOLN Take 3 mLs by nebulization every 6 (six) hours as needed. Patient taking differently: Take 3 mLs by nebulization every 6 (six) hours as needed (wheezing/SOB). 05/18/23   Cobb, Mariah Shines, NP  sennosides-docusate sodium  (SENOKOT-S) 8.6-50 MG tablet Take 1 tablet by mouth in the morning and at bedtime.    [provider]  sertraline  (ZOLOFT ) 50 MG tablet Take 50 mg by mouth daily.    [provider]    Physical Exam: Vitals:   11/09/23 2030 11/09/23 2120 11/10/23 0121 11/10/23 0500  BP: 107/73 (!) 110/56 (!) 120/57 116/66  Pulse: 80 84 75 84  Resp: (!) 32  15 16  Temp:  (!) 97.3 F (36.3 C) 98.2 F (36.8 C) 98.9 F (37.2 C)  TempSrc:  Oral Oral   SpO2: 100% 97% 95% 98%  Weight:      Height:        Constitutional: NAD, calm, comfortable Vitals:   11/09/23 2030 11/09/23 2120 11/10/23 0121 11/10/23 0500  BP: 107/73 (!) 110/56 (!) 120/57 116/66  Pulse: 80 84 75 84  Resp: (!) 32  15 16  Temp:  (!) 97.3 F (36.3 C) 98.2 F (36.8 C) 98.9 F (37.2 C)  TempSrc:  Oral Oral   SpO2: 100% 97% 95% 98%  Weight:      Height:       ENMT: Mucous membranes are dry.  Neck: normal, supple, no masses, no thyromegaly Respiratory:  diffuse rhonchi,normal respiratory effort Cardiovascular: Regular rate and rhythm, no murmurs / rubs / gallops. No extremity edema. 2+ pedal pulses. Abdomen: no tenderness, no masses palpated. No hepatosplenomegaly. Bowel sounds positive.  Musculoskeletal: no clubbing / cyanosis. No joint deformity upper and lower extremities. Good ROM, no  contractures. Normal muscle tone.  Skin: no rashes, lesions, ulcers. No induration Neurologic: CN 2-12 grossly intact. Sensation intact,  MAE  Psychiatric:alert, lethargic confused   Labs on Admission: I have personally reviewed following labs and imaging studies  CBC: Recent Labs  Lab 11/09/23 1738 11/10/23 0041  WBC 11.0* 5.6  NEUTROABS 9.8*  --   HGB 12.4 10.9*  HCT 37.1 33.9*  MCV 92.3 92.6  PLT 211 147*   Basic Metabolic Panel: Recent Labs  Lab 11/09/23 1738 11/10/23 0041  NA 138 139  K 3.6 3.2*  CL 95* 99  CO2 27 29  GLUCOSE 143* 138*  BUN 14 13  CREATININE 0.62 0.60  CALCIUM 9.5 8.5*   GFR: Estimated Creatinine Clearance: 29.1 mL/min (by C-G formula based on SCr of 0.6 mg/dL). Liver Function Tests: Recent Labs  Lab 11/09/23 1738 11/10/23 0041  AST 30 20  ALT 9 11  ALKPHOS 98 65  BILITOT 0.5 0.9  PROT 7.0 5.8*  ALBUMIN 3.7 2.9*   No results for input(s): LIPASE, AMYLASE in the last 168 hours. No results for input(s): AMMONIA in the last 168 hours. Coagulation Profile: Recent Labs  Lab 11/09/23 1738  INR 1.2   Cardiac Enzymes: No results for input(s): CKTOTAL, CKMB, CKMBINDEX, TROPONINI in the last 168 hours. BNP (last 3 results) No results for input(s): PROBNP in the last 8760 hours. HbA1C: No results for input(s): HGBA1C in the last 72 hours. CBG: No results for input(s): GLUCAP in the last 168 hours. Lipid Profile: No results for input(s): CHOL, HDL, LDLCALC, TRIG, CHOLHDL, LDLDIRECT in the last 72 hours. Thyroid  Function Tests: No results for input(s): TSH, T4TOTAL, FREET4, T3FREE, THYROIDAB in the last 72 hours. Anemia Panel: No results for input(s): VITAMINB12, FOLATE, FERRITIN, TIBC, IRON, RETICCTPCT in the last 72 hours. Urine analysis:    Component Value Date/Time   COLORURINE YELLOW 10/23/2023 1344   APPEARANCEUR CLEAR 10/23/2023 1344   APPEARANCEUR Clear 03/28/2017 1232    LABSPEC 1.015 10/23/2023 1344   PHURINE 6.0 10/23/2023 1344   GLUCOSEU NEGATIVE 10/23/2023 1344   HGBUR NEGATIVE 10/23/2023 1344   BILIRUBINUR NEGATIVE 10/23/2023 1344   BILIRUBINUR Negative 03/28/2017 1232   KETONESUR NEGATIVE 10/23/2023 1344   PROTEINUR NEGATIVE 10/23/2023 1344   NITRITE NEGATIVE 10/23/2023 1344   LEUKOCYTESUR TRACE (A) 10/23/2023 1344    Radiological Exams on Admission: CT Angio Chest PE W and/or Wo Contrast Result Date: 11/09/2023 CLINICAL DATA:  Difficulty breathing EXAM: CT ANGIOGRAPHY CHEST WITH CONTRAST TECHNIQUE: Multidetector CT imaging of the chest was performed using the standard protocol during bolus administration of intravenous contrast. Multiplanar CT image reconstructions and MIPs were obtained to evaluate the vascular anatomy. RADIATION DOSE REDUCTION: This exam was performed according to the departmental dose-optimization program which includes automated exposure control, adjustment of the mA and/or kV according to patient size and/or use of iterative reconstruction technique. CONTRAST:  65mL OMNIPAQUE IOHEXOL 350 MG/ML SOLN COMPARISON:  Chest x-ray from earlier in the same day. FINDINGS: Cardiovascular: Atherosclerotic calcifications of the thoracic aorta are noted. No aneurysmal dilatation or dissection is seen. Heart is not significantly enlarged in size. The pulmonary artery shows a normal branching pattern bilaterally. No definitive filling defect is noted. Mediastinum/Nodes: Thoracic inlet is within normal limits. No hilar or mediastinal adenopathy is noted. The esophagus as visualized is within normal limits. Lungs/Pleura: Lungs are well aerated bilaterally. No sizable effusion is seen. Diffuse tree-in-bud opacities are identified bilaterally consistent with diffuse inflammatory change. Chronic pleural and parenchymal scarring is noted in the apices stable from recent CT from 07/30/2023. No focal confluent infiltrate is seen. Upper Abdomen: Hypodensity is  noted within the medial aspect of the spleen likely representing a small cyst or hemangioma. Musculoskeletal: Degenerative changes of the thoracic spine are noted. No acute rib abnormality is seen. Review of the MIP images confirms the above findings. IMPRESSION: No evidence of pulmonary emboli. Diffuse bilateral tree in bud opacities consistent with inflammatory change. MAI deserves consideration. Chronic scarring in the apices bilaterally. Aortic Atherosclerosis (ICD10-I70.0). Electronically Signed   By: Violeta Grey M.D.   On: 11/09/2023 20:02   DG Chest Portable 1 View  Result Date: 11/09/2023 CLINICAL DATA:  Shortness of breath EXAM: PORTABLE CHEST 1 VIEW COMPARISON:  10/23/2023, 07/30/2023, 04/03/2019 FINDINGS: Chronic pleuroparenchymal scarring at the apices. Evidence for chronic lung disease with diffuse coarse interstitial opacity. No acute confluent airspace disease or effusion. Stable cardiomediastinal silhouette with aortic atherosclerosis. Multiple skin fold artifact over the left chest IMPRESSION: No active disease. Evidence for chronic lung disease. Electronically Signed   By: Esmeralda Hedge M.D.   On: 11/09/2023 18:26    EKG: Independently reviewed  Assessment/Plan    CAP with acute hypoxic respiratory failure  -in setting of bronchiectasis   -broad spectrum, de-escalate as able  -pulmonary toilet  -afb x3 due to concern for MAI -urine ag, sputum, f/u on culture data    Acute COPD exacerbation with acute hypoxic respiratory failure  -in background of bronchiectasis  -admit med tele/progressive - solumedrol iv ,taper to po prednisone per protocol - ctx/doxycycline due to hx of bronchiectasis /CAP atypical  -f/u on sputum cultures  - nebs standing and prn  -resume chronic inhalers  -pulmonary toilet  -wean O2 as able     A-fib  -on Eliquis   GERD -ppi  Dementia -no active issue   PVCs -monitor electrolytes  -ensure stable   DVT prophylaxis:  Eliquis  Code Status:  full/ as discussed per patient wishes in event of cardiac arrest  Family Communication: none at bedside Disposition Plan: patient  expected to be admitted greater than 2 midnights  Consults called: n/a, consider pulmonary Admission status: progressive   Sabas Cradle MD [facility]   If 7PM-7AM, please contact night-coverage [email or web address] Password [email or web address]  11/10/2023, 6:01 AM

## 2023-11-10 NOTE — Progress Notes (Signed)
 PROGRESS NOTE    [name]  EAV:[id number] DOB: 1931-02-16 DOA: 11/09/2023 PCP: Tena Feeling, MD   Brief Narrative:  The patient is an elderly 89 year old Caucasian female with past medical history significant for atrial fibrillation on anticoagulation, bronchiectasis, COPD, GERD, dementia and hearing loss, history of PVCs presented the ED with worsening cough and progressive shortness of breath for the last few days.  Patient is pleasantly demented and was unable to give a subjective history but it was noted that she had no pain and had some nausea and but no diarrhea.  She is also noted of poor p.o. intake and lives in a facility at [facility].  Upon arrival she had a temperature of 100.8 and O2 saturation of 85% on room air.  Further workup was done and CT chest PE done and showed no evidence of pulm embolus but did show diffuse bilateral tree-in-bud opacities consistent with laboratory changes with MAC being considered and chronic scarring at the apices.  Respiratory virus panel was positive for parainfluenza virus.  She was placed on nebs, oxygen, antibiotics and will continue monitor clinical response to Intervention.  Assessment and Plan:  Acute Respiratory Failure with Hypoxia in the setting of Bronchiectasis and CAP with Parainfluenza virus ruling out bacterial secondary infection /acute exacerbation of COPD: CTA done and as above showed no PE but did show diffuse bilateral tree-in-bud opacities consistent with possible MAI and chronic scarring.  Continue with pulmonary toileting and add guaifenesin 1200 mL p.o. twice daily, flutter valve, suggest pulmonary.  Continuing antibiotics and she has been changed to ceftriaxone  and doxycycline for now.  Continue with Xopenex /Atrovent .  Repeat chest x-ray in the AM.  She will need an amatory home O2 prior to discharge.  Continue supplemental oxygen via nasal cannula.  Getting Solu-Medrol and received Solu-Medrol 125 mg x 1 and now getting 40  every 12.  Continue monitor respiratory status carefully.  Will also add Brovana and budesonide .  PCT was 0.49 and BNP was 508.0. Blood Cx x2 pending.   Paroxysmal Atrial Fibrillation: Resume anticoagulation with Apixaban .  TSH was 1.071  GERD/GI prophylaxis: Continue PPI w/ Pantoprazole   Hypokalemia: Mild and K+ is now 3.1. Replete with po KCL 40 mEQ q8h x2. CTM and Repelte as Necessary. Repeat CMP in the AM  Dementia: Will place on delirium precaution currently has no active issue  Normocytic Anemia: Hgb/Hct went from 12.4/37.1 -> 10.9/33.9 -> 10.6/34.9. Check Anemia Panel in the AM. CTM for S/Sx of bleeding; No overt bleeding noted. Repeat CBC in the AM  Thrombocytopenia: Plt Count went from 211 -> 147 -> 146. CTM for S/Sx of Bleeding; No overt bleeding noted. Repeat CBC in the AM  Hypoalbuminemia: Patient's Albumin Level went from 3.1 -> 3.7 -> 2.9 -> 2.6. CTM and Trend and repeat CMP in the AM   DVT prophylaxis: apixaban  (ELIQUIS ) tablet 2.5 mg Start: 11/10/23 1545 SCDs Start: 11/09/23 2353 apixaban  (ELIQUIS ) tablet 2.5 mg    Code Status: Full Code Family Communication: D/w Family present @ bedside  Disposition Plan:  Level of care: Progressive Status is: Inpatient Remains inpatient appropriate because: Needs further clinical improvement in her Respiratory Status   Consultants:  None  Procedures:  As delineated as above  Antimicrobials:  Anti-infectives (From admission, onward)    Start     Dose/Rate Route Frequency Ordered Stop   11/10/23 1600  cefTRIAXone  (ROCEPHIN ) 2 g in sodium chloride  0.9 % 100 mL IVPB  2 g 200 mL/hr over 30 Minutes Intravenous Every 24 hours 11/10/23 0618     11/10/23 0045  doxycycline (VIBRAMYCIN) 100 mg in sodium chloride  0.9 % 250 mL IVPB        100 mg 125 mL/hr over 120 Minutes Intravenous Every 12 hours 11/09/23 2355     11/09/23 1745  cefTRIAXone  (ROCEPHIN ) 2 g in sodium chloride  0.9 % 100 mL IVPB        2 g 200 mL/hr over 30  Minutes Intravenous Once 11/09/23 1734 11/09/23 1910   11/09/23 1745  azithromycin (ZITHROMAX) 500 mg in sodium chloride  0.9 % 250 mL IVPB        500 mg 250 mL/hr over 60 Minutes Intravenous  Once 11/09/23 1734 11/09/23 1910       Objective: Vitals:   11/10/23 0826 11/10/23 0907 11/10/23 1236 11/10/23 1434  BP:  (!) 114/50 120/71   Pulse:  88 81   Resp:  20 18   Temp:  97.7 F (36.5 C) 98.4 F (36.9 C)   TempSrc:   Oral   SpO2: 99% 97% 96% 97%  Weight:      Height:        Intake/Output Summary (Last 24 hours) at 11/10/2023 1546 Last data filed at 11/10/2023 1446 Gross per 24 hour  Intake 2701.03 ml  Output 200 ml  Net 2501.03 ml   Filed Weights   11/09/23 1726  Weight: 39.5 kg   Data Reviewed: I have personally reviewed following labs and imaging studies  CBC: Recent Labs  Lab 11/09/23 1738 11/10/23 0041 11/10/23 0834  WBC 11.0* 5.6 5.0  NEUTROABS 9.8*  --  4.6  HGB 12.4 10.9* 10.6*  HCT 37.1 33.9* 34.9*  MCV 92.3 92.6 94.8  PLT 211 147* 146*   Basic Metabolic Panel: Recent Labs  Lab 11/09/23 1738 11/10/23 0041 11/10/23 0834  NA 138 139 138  K 3.6 3.2* 3.1*  CL 95* 99 99  CO2 27 29 29   GLUCOSE 143* 138* 107*  BUN 14 13 13   CREATININE 0.62 0.60 0.40*  CALCIUM 9.5 8.5* 7.9*   GFR: Estimated Creatinine Clearance: 29.1 mL/min (A) (by C-G formula based on SCr of 0.4 mg/dL (L)). Liver Function Tests: Recent Labs  Lab 11/09/23 1738 11/10/23 0041 11/10/23 0834  AST 30 20 19   ALT 9 11 11   ALKPHOS 98 65 64  BILITOT 0.5 0.9 0.8  PROT 7.0 5.8* 5.6*  ALBUMIN 3.7 2.9* 2.6*   No results for input(s): LIPASE, AMYLASE in the last 168 hours. No results for input(s): AMMONIA in the last 168 hours. Coagulation Profile: Recent Labs  Lab 11/09/23 1738  INR 1.2   Cardiac Enzymes: No results for input(s): CKTOTAL, CKMB, CKMBINDEX, TROPONINI in the last 168 hours. BNP (last 3 results) No results for input(s): PROBNP in the last 8760  hours. HbA1C: Recent Labs    11/10/23 0834  HGBA1C 5.2   CBG: Recent Labs  Lab 11/10/23 0635 11/10/23 1104  GLUCAP 116* 141*   Lipid Profile: No results for input(s): CHOL, HDL, LDLCALC, TRIG, CHOLHDL, LDLDIRECT in the last 72 hours. Thyroid  Function Tests: Recent Labs    11/10/23 0834  TSH 1.071   Anemia Panel: No results for input(s): VITAMINB12, FOLATE, FERRITIN, TIBC, IRON, RETICCTPCT in the last 72 hours. Sepsis Labs: Recent Labs  Lab 11/09/23 1738 11/09/23 2146 11/10/23 0041  PROCALCITON  --   --  0.49  LATICACIDVEN 2.7* 1.8  --    Recent Results (from the past  240 hours)  Resp panel by RT-PCR (RSV, Flu A&B, Covid) Anterior Nasal Swab     Status: None   Collection Time: 11/09/23  5:38 PM   Specimen: Anterior Nasal Swab  Result Value Ref Range Status   SARS Coronavirus 2 by RT PCR NEGATIVE NEGATIVE Final    Comment: (NOTE) SARS-CoV-2 target nucleic acids are NOT DETECTED.  The SARS-CoV-2 RNA is generally detectable in upper respiratory specimens during the acute phase of infection. The lowest concentration of SARS-CoV-2 viral copies this assay can detect is 138 copies/mL. A negative result does not preclude SARS-Cov-2 infection and should not be used as the sole basis for treatment or other patient management decisions. A negative result may occur with  improper specimen collection/handling, submission of specimen other than nasopharyngeal swab, presence of viral mutation(s) within the areas targeted by this assay, and inadequate number of viral copies(<138 copies/mL). A negative result must be combined with clinical observations, patient history, and epidemiological information. The expected result is Negative.  Fact Sheet for Patients:  BloggerCourse.com  Fact Sheet for Healthcare Providers:  SeriousBroker.it  This test is no t yet approved or cleared by the [place]  FDA  and  has been authorized for detection and/or diagnosis of SARS-CoV-2 by FDA under an Emergency Use Authorization (EUA). This EUA will remain  in effect (meaning this test can be used) for the duration of the COVID-19 declaration under Section 564(b)(1) of the Act, 21 U.S.C.section 360bbb-3(b)(1), unless the authorization is terminated  or revoked sooner.       Influenza A by PCR NEGATIVE NEGATIVE Final   Influenza B by PCR NEGATIVE NEGATIVE Final    Comment: (NOTE) The Xpert Xpress SARS-CoV-2/FLU/RSV plus assay is intended as an aid in the diagnosis of influenza from Nasopharyngeal swab specimens and should not be used as a sole basis for treatment. Nasal washings and aspirates are unacceptable for Xpert Xpress SARS-CoV-2/FLU/RSV testing.  Fact Sheet for Patients: BloggerCourse.com  Fact Sheet for Healthcare Providers: SeriousBroker.it  This test is not yet approved or cleared by the [place]  FDA and has been authorized for detection and/or diagnosis of SARS-CoV-2 by FDA under an Emergency Use Authorization (EUA). This EUA will remain in effect (meaning this test can be used) for the duration of the COVID-19 declaration under Section 564(b)(1) of the Act, 21 U.S.C. section 360bbb-3(b)(1), unless the authorization is terminated or revoked.     Resp Syncytial Virus by PCR NEGATIVE NEGATIVE Final    Comment: (NOTE) Fact Sheet for Patients: BloggerCourse.com  Fact Sheet for Healthcare Providers: SeriousBroker.it  This test is not yet approved or cleared by the [place]  FDA and has been authorized for detection and/or diagnosis of SARS-CoV-2 by FDA under an Emergency Use Authorization (EUA). This EUA will remain in effect (meaning this test can be used) for the duration of the COVID-19 declaration under Section 564(b)(1) of the Act, 21 U.S.C. section 360bbb-3(b)(1),  unless the authorization is terminated or revoked.  Performed at [facility], [address], [place], [place] [address]   Respiratory (~20 pathogens) panel by PCR     Status: Abnormal   Collection Time: 11/10/23 12:23 PM   Specimen: Nasopharyngeal Swab; Respiratory  Result Value Ref Range Status   Adenovirus NOT DETECTED NOT DETECTED Final   Coronavirus 229E NOT DETECTED NOT DETECTED Final    Comment: (NOTE) The Coronavirus on the Respiratory Panel, DOES NOT test for the novel  Coronavirus (2019 nCoV)    Coronavirus HKU1 NOT DETECTED NOT  DETECTED Final   Coronavirus NL63 NOT DETECTED NOT DETECTED Final   Coronavirus OC43 NOT DETECTED NOT DETECTED Final   Metapneumovirus NOT DETECTED NOT DETECTED Final   Rhinovirus / Enterovirus NOT DETECTED NOT DETECTED Final   Influenza A NOT DETECTED NOT DETECTED Final   Influenza B NOT DETECTED NOT DETECTED Final   Parainfluenza Virus 1 NOT DETECTED NOT DETECTED Final   Parainfluenza Virus 2 NOT DETECTED NOT DETECTED Final   Parainfluenza Virus 3 DETECTED (A) NOT DETECTED Final   Parainfluenza Virus 4 NOT DETECTED NOT DETECTED Final   Respiratory Syncytial Virus NOT DETECTED NOT DETECTED Final   Bordetella pertussis NOT DETECTED NOT DETECTED Final   Bordetella Parapertussis NOT DETECTED NOT DETECTED Final   Chlamydophila pneumoniae NOT DETECTED NOT DETECTED Final   Mycoplasma pneumoniae NOT DETECTED NOT DETECTED Final    Comment: Performed at [facility], [address] [place], [place] [address]    Radiology Studies: CT Angio Chest PE W and/or Wo Contrast Result Date: 11/09/2023 CLINICAL DATA:  Difficulty breathing EXAM: CT ANGIOGRAPHY CHEST WITH CONTRAST TECHNIQUE: Multidetector CT imaging of the chest was performed using the standard protocol during bolus administration of intravenous contrast. Multiplanar CT image reconstructions and MIPs were obtained to evaluate the vascular anatomy. RADIATION DOSE REDUCTION:  This exam was performed according to the departmental dose-optimization program which includes automated exposure control, adjustment of the mA and/or kV according to patient size and/or use of iterative reconstruction technique. CONTRAST:  65mL OMNIPAQUE IOHEXOL 350 MG/ML SOLN COMPARISON:  Chest x-ray from earlier in the same day. FINDINGS: Cardiovascular: Atherosclerotic calcifications of the thoracic aorta are noted. No aneurysmal dilatation or dissection is seen. Heart is not significantly enlarged in size. The pulmonary artery shows a normal branching pattern bilaterally. No definitive filling defect is noted. Mediastinum/Nodes: Thoracic inlet is within normal limits. No hilar or mediastinal adenopathy is noted. The esophagus as visualized is within normal limits. Lungs/Pleura: Lungs are well aerated bilaterally. No sizable effusion is seen. Diffuse tree-in-bud opacities are identified bilaterally consistent with diffuse inflammatory change. Chronic pleural and parenchymal scarring is noted in the apices stable from recent CT from 07/30/2023. No focal confluent infiltrate is seen. Upper Abdomen: Hypodensity is noted within the medial aspect of the spleen likely representing a small cyst or hemangioma. Musculoskeletal: Degenerative changes of the thoracic spine are noted. No acute rib abnormality is seen. Review of the MIP images confirms the above findings. IMPRESSION: No evidence of pulmonary emboli. Diffuse bilateral tree in bud opacities consistent with inflammatory change. MAI deserves consideration. Chronic scarring in the apices bilaterally. Aortic Atherosclerosis (ICD10-I70.0). Electronically Signed   By: Violeta Grey M.D.   On: 11/09/2023 20:02   DG Chest Portable 1 View Result Date: 11/09/2023 CLINICAL DATA:  Shortness of breath EXAM: PORTABLE CHEST 1 VIEW COMPARISON:  10/23/2023, 07/30/2023, 04/03/2019 FINDINGS: Chronic pleuroparenchymal scarring at the apices. Evidence for chronic lung disease  with diffuse coarse interstitial opacity. No acute confluent airspace disease or effusion. Stable cardiomediastinal silhouette with aortic atherosclerosis. Multiple skin fold artifact over the left chest IMPRESSION: No active disease. Evidence for chronic lung disease. Electronically Signed   By: Esmeralda Hedge M.D.   On: 11/09/2023 18:26   Scheduled Meds:  apixaban   2.5 mg Oral BID   arformoterol  15 mcg Nebulization BID   budesonide  (PULMICORT ) nebulizer solution  0.25 mg Nebulization BID   guaiFENesin  1,200 mg Oral BID   ipratropium  0.5 mg Nebulization Q6H   levalbuterol   0.63  mg Nebulization Q6H   methylPREDNISolone (SOLU-MEDROL) injection  40 mg Intravenous Q12H   pantoprazole  40 mg Oral Daily   potassium chloride   40 mEq Oral Q8H   Continuous Infusions:  sodium chloride  75 mL/hr at 11/10/23 1446   cefTRIAXone  (ROCEPHIN )  IV 2 g (11/10/23 1522)   doxycycline (VIBRAMYCIN) IV Stopped (11/10/23 1409)    LOS: 1 day   Aura Leeds, DO [facility] Available via Epic secure chat 7am-7pm After these hours, please refer to coverage provider listed on [email or web address] 11/10/2023, 3:46 PM

## 2023-11-10 NOTE — TOC Progression Note (Signed)
 Transition of Care [facility]) - Progression Note    Patient Details  Name: [name] MRN: [id number] Date of Birth: 06/17/1930  [facility]) CM/SW Contact  Levie Ream, RN Phone Number: 11/10/2023, 3:35 PM  Clinical Narrative:    LVM for pt's dtr Sheron Dixons [phone]); unable to complete assessment; chart review shows pt from [facility]; spoke w/ Gillian Lacrosse. MT at facility [phone]); she says pt is from [place], and can return at d/c; FL2, d/c summary, and DNR needed; awaiting return call from dtr.        Expected Discharge Plan and Services                                               Social Determinants of Health (SDOH) Interventions SDOH Screenings   Food Insecurity: No Food Insecurity (11/09/2023)  Housing: Low Risk  (11/09/2023)  Transportation Needs: No Transportation Needs (11/09/2023)  Utilities: Not At Risk (11/09/2023)  Social Connections: Socially Isolated (11/09/2023)  Tobacco Use: Medium Risk (11/09/2023)    Readmission Risk Interventions    08/02/2023    4:19 PM  Readmission Risk Prevention Plan  Post Dischage Appt Complete  Medication Screening Complete  Transportation Screening Complete

## 2023-11-10 NOTE — TOC Initial Note (Addendum)
 Transition of Care [facility]) - Initial/Assessment Note    Patient Details  Name: [name] MRN: [id number] Date of Birth: 1930/07/25  Transition of Care [facility]) CM/SW Contact:    Levie Ream, RN Phone Number: 11/10/2023, 4:30 PM  Clinical Narrative:                 Pt has dementia; return call from pt's dtr Sheron Dixons [phone]); she says pt is from [facility]; she is plans for pt to return at d/c; Ms Chick Cotton says she will provide transportation; she verified insurance/PCP; pt's dtr says she does not have SDOH risks; pt has wheel chair and HH services w/ facility's contract agency [facility]); pt has glasses and bil HA; her dtr says pt is able to feed herself; she is not sure if pt is continent of bowel/bladder; she also says pt has spot above her rectum that the facility is caring for; awaiting PT/OT evals; see previous TOC progress note; TOC is following.  Expected Discharge Plan: Skilled Nursing Facility Barriers to Discharge: Continued Medical Work up   Patient Goals and CMS Choice            Expected Discharge Plan and Services   Discharge Planning Services: CM Consult   Living arrangements for the past 2 months: Assisted Living Facility                                      Prior Living Arrangements/Services Living arrangements for the past 2 months: Assisted Living Facility Lives with:: Facility Resident Patient language and need for interpreter reviewed:: Yes Do you feel safe going back to the place where you live?: Yes      Need for Family Participation in Patient Care: Yes (Comment) Care giver support system in place?: Yes (comment) Current home services: DME Criminal Activity/Legal Involvement Pertinent to Current Situation/Hospitalization: No - Comment as needed  Activities of Daily Living   ADL Screening (condition at time of admission) Independently performs ADLs?: No Does the patient have a NEW difficulty with  bathing/dressing/toileting/self-feeding that is expected to last >3 days?: No Does the patient have a NEW difficulty with getting in/out of bed, walking, or climbing stairs that is expected to last >3 days?: No Does the patient have a NEW difficulty with communication that is expected to last >3 days?: No Is the patient deaf or have difficulty hearing?: Yes Does the patient have difficulty seeing, even when wearing glasses/contacts?: Yes Does the patient have difficulty concentrating, remembering, or making decisions?: Yes  Permission Sought/Granted Permission sought to share information with : Case Manager Permission granted to share information with : Yes, Verbal Permission Granted  Share Information with NAME: Case Manager     Permission granted to share info w Relationship: Sheron Dixons (dtr) [phone]     Emotional Assessment Appearance:: Other (Comment Required (unable to assess) Attitude/Demeanor/Rapport: Unable to Assess Affect (typically observed): Unable to Assess Orientation: :  (unable to assess) Alcohol / Substance Use: Not Applicable Psych Involvement: No (comment)  Admission diagnosis:  Acute respiratory failure with hypoxia (HCC) [J96.01] Sepsis, due to unspecified organism, unspecified whether acute organ dysfunction present [facility]) [A41.9] Patient Active Problem List   Diagnosis Date Noted   Acute respiratory failure with hypoxia (HCC) 11/09/2023   Hypercoagulable state due to permanent atrial fibrillation (HCC) 08/13/2023   Protein-calorie malnutrition, severe 08/01/2023   Closed fracture of hip, left, initial  encounter (HCC) 07/31/2023   QT prolongation 07/31/2023   Dementia 07/31/2023   Varicose vein of leg 11/02/2016   Anticoagulated 05/02/2016   Dyspnea 11/04/2015   Lower extremity edema 10/15/2015   Atrial fibrillation with RVR (HCC)    Atrial fibrillation with rapid ventricular response (HCC) 07/02/2015   Paroxysmal atrial fibrillation (HCC)  07/02/2015   Bronchiectasis (HCC) 02/22/2015   Pseudomonas aeruginosa colonization 02/22/2015   COPD mixed type (HCC) 12/28/2014   DOE (dyspnea on exertion) 12/28/2014   Palpitations 09/11/2013   Supraventricular premature beats 07/18/2013   Occlusion and stenosis of carotid artery without mention of cerebral infarction 07/18/2013   Nuclear cataract 07/07/2011   PCP:  Tena Feeling, MD Pharmacy:   [id number] Jonette Nestle, [place] - [address] AT [facility] [address] [place] [address] Phone: [phone] Fax: [phone]  [facility] - [place], [place] - [address] [place] [phone] Phone: [phone] Fax: [phone]  [facility] - [place], [place] - [address] [place] [address] [place] [address] Phone: [phone] Fax: [phone]  [facility] - [facility] [address] [place] [address] Phone: [phone] Fax: [phone]     Social Drivers of Health (SDOH) Social History: SDOH Screenings   Food Insecurity: No Food Insecurity (11/09/2023)  Housing: Low Risk  (11/09/2023)  Transportation Needs: No Transportation Needs (11/09/2023)  Utilities: Not At Risk (11/09/2023)  Social Connections: Socially Isolated (11/09/2023)  Tobacco Use: Medium Risk (11/09/2023)   SDOH Interventions:     Readmission Risk Interventions    08/02/2023    4:19 PM  Readmission Risk Prevention Plan  Post Dischage Appt Complete  Medication Screening Complete  Transportation Screening Complete

## 2023-11-11 ENCOUNTER — Inpatient Hospital Stay (HOSPITAL_COMMUNITY)

## 2023-11-11 DIAGNOSIS — Z515 Encounter for palliative care: Secondary | ICD-10-CM

## 2023-11-11 DIAGNOSIS — I4891 Unspecified atrial fibrillation: Secondary | ICD-10-CM | POA: Diagnosis not present

## 2023-11-11 DIAGNOSIS — J9601 Acute respiratory failure with hypoxia: Secondary | ICD-10-CM | POA: Diagnosis not present

## 2023-11-11 DIAGNOSIS — J122 Parainfluenza virus pneumonia: Secondary | ICD-10-CM | POA: Diagnosis not present

## 2023-11-11 DIAGNOSIS — I480 Paroxysmal atrial fibrillation: Secondary | ICD-10-CM | POA: Diagnosis not present

## 2023-11-11 DIAGNOSIS — I5023 Acute on chronic systolic (congestive) heart failure: Secondary | ICD-10-CM

## 2023-11-11 LAB — COMPREHENSIVE METABOLIC PANEL WITH GFR
ALT: 11 U/L (ref 0–44)
AST: 18 U/L (ref 15–41)
Albumin: 2.5 g/dL — ABNORMAL LOW (ref 3.5–5.0)
Alkaline Phosphatase: 56 U/L (ref 38–126)
Anion gap: 7 (ref 5–15)
BUN: 14 mg/dL (ref 8–23)
CO2: 29 mmol/L (ref 22–32)
Calcium: 8.1 mg/dL — ABNORMAL LOW (ref 8.9–10.3)
Chloride: 106 mmol/L (ref 98–111)
Creatinine, Ser: 0.39 mg/dL — ABNORMAL LOW (ref 0.44–1.00)
GFR, Estimated: >60 mL/min (ref >60)
Glucose, Bld: 121 mg/dL — ABNORMAL HIGH (ref 70–99)
Potassium: 3.5 mmol/L (ref 3.5–5.1)
Sodium: 142 mmol/L (ref 135–145)
Total Bilirubin: 0.7 mg/dL (ref 0.0–1.2)
Total Protein: 5.3 g/dL — ABNORMAL LOW (ref 6.5–8.1)

## 2023-11-11 LAB — ECHOCARDIOGRAM COMPLETE
AR max vel: 1.99 cm2
AV Area VTI: 1.72 cm2
AV Area mean vel: 1.92 cm2
AV Mean grad: 2.3 mmHg
AV Peak grad: 4 mmHg
Ao pk vel: 1.01 m/s
Calc EF: 28.5 %
Height: 60 in
S' Lateral: 3.55 cm
Single Plane A2C EF: 32.3 %
Single Plane A4C EF: 30.8 %
Weight: 1392 [oz_av]

## 2023-11-11 LAB — FERRITIN: Ferritin: 313 ng/mL — ABNORMAL HIGH (ref 11–307)

## 2023-11-11 LAB — CBC WITH DIFFERENTIAL/PLATELET
Abs Immature Granulocytes: 0.04 10*3/uL (ref 0.00–0.07)
Basophils Absolute: 0 10*3/uL (ref 0.0–0.1)
Basophils Relative: 0 %
Eosinophils Absolute: 0 10*3/uL (ref 0.0–0.5)
Eosinophils Relative: 0 %
HCT: 34 % — ABNORMAL LOW (ref 36.0–46.0)
Hemoglobin: 10.3 g/dL — ABNORMAL LOW (ref 12.0–15.0)
Immature Granulocytes: 1 %
Lymphocytes Relative: 4 %
Lymphs Abs: 0.3 10*3/uL — ABNORMAL LOW (ref 0.7–4.0)
MCH: 29.1 pg (ref 26.0–34.0)
MCHC: 30.3 g/dL (ref 30.0–36.0)
MCV: 96 fL (ref 80.0–100.0)
Monocytes Absolute: 0.2 10*3/uL (ref 0.1–1.0)
Monocytes Relative: 4 %
Neutro Abs: 5.6 10*3/uL (ref 1.7–7.7)
Neutrophils Relative %: 91 %
Platelets: 163 10*3/uL (ref 150–400)
RBC: 3.54 MIL/uL — ABNORMAL LOW (ref 3.87–5.11)
RDW: 14.7 % (ref 11.5–15.5)
WBC: 6.1 10*3/uL (ref 4.0–10.5)
nRBC: 0 % (ref 0.0–0.2)

## 2023-11-11 LAB — RETICULOCYTES
Immature Retic Fract: 16 % — ABNORMAL HIGH (ref 2.3–15.9)
RBC.: 3.52 MIL/uL — ABNORMAL LOW (ref 3.87–5.11)
Retic Count, Absolute: 63.7 10*3/uL (ref 19.0–186.0)
Retic Ct Pct: 1.8 % (ref 0.4–3.1)

## 2023-11-11 LAB — GLUCOSE, CAPILLARY
Glucose-Capillary: 112 mg/dL — ABNORMAL HIGH (ref 70–99)
Glucose-Capillary: 137 mg/dL — ABNORMAL HIGH (ref 70–99)
Glucose-Capillary: 54 mg/dL — ABNORMAL LOW (ref 70–99)
Glucose-Capillary: 233 mg/dL — ABNORMAL HIGH (ref 70–99)
Glucose-Capillary: 131 mg/dL — ABNORMAL HIGH (ref 70–99)

## 2023-11-11 LAB — FOLATE: Folate: 4.9 ng/mL — ABNORMAL LOW (ref >5.9)

## 2023-11-11 LAB — PHOSPHORUS: Phosphorus: 2.3 mg/dL — ABNORMAL LOW (ref 2.5–4.6)

## 2023-11-11 LAB — IRON AND TIBC
Iron: 33 ug/dL (ref 28–170)
Saturation Ratios: 27 % (ref 10.4–31.8)
TIBC: 120 ug/dL — ABNORMAL LOW (ref 250–450)
UIBC: 87 ug/dL

## 2023-11-11 LAB — MAGNESIUM: Magnesium: 1.9 mg/dL (ref 1.7–2.4)

## 2023-11-11 LAB — VITAMIN B12: Vitamin B-12: 230 pg/mL (ref 180–914)

## 2023-11-11 MED ORDER — DEXTROSE 50 % IV SOLN: 25.0000 g | INTRAVENOUS | Status: AC

## 2023-11-11 MED ORDER — SERTRALINE HCL 100 MG PO TABS
50.0000 mg | ORAL_TABLET | Freq: Every day | ORAL | Status: DC
  Administered 2023-11-11: 50 mg via ORAL
  Filled 2023-11-11: qty 1

## 2023-11-11 MED ORDER — LORAZEPAM 2 MG/ML PO CONC
1.0000 mg | ORAL | Status: DC | PRN
  Administered 2023-11-12: 0.5 mg via ORAL
  Filled 2023-11-11: qty 1
  Filled 2023-11-11: qty 0.5

## 2023-11-11 MED ORDER — MORPHINE SULFATE (PF) 2 MG/ML IV SOLN
2.0000 mg | INTRAVENOUS | Status: DC | PRN
  Administered 2023-11-11 – 2023-11-12 (×2): 2 mg via INTRAVENOUS
  Administered 2023-11-12: 4 mg via INTRAVENOUS
  Filled 2023-11-11: qty 1
  Filled 2023-11-11: qty 2
  Filled 2023-11-11: qty 1

## 2023-11-11 MED ORDER — METOPROLOL TARTRATE 5 MG/5ML IV SOLN: 2.5000 mg | Freq: Four times a day (QID) | INTRAVENOUS | Status: DC | PRN

## 2023-11-11 MED ORDER — POLYVINYL ALCOHOL 1.4 % OP SOLN
1.0000 [drp] | Freq: Four times a day (QID) | OPHTHALMIC | Status: AC | PRN
Start: 2023-11-11 — End: ?

## 2023-11-11 MED ORDER — GLYCOPYRROLATE 0.2 MG/ML IJ SOLN: 0.2000 mg | INTRAMUSCULAR | Status: DC | PRN

## 2023-11-11 MED ORDER — ACETAMINOPHEN 650 MG RE SUPP: 650.0000 mg | Freq: Four times a day (QID) | RECTAL | Status: DC | PRN

## 2023-11-11 MED ORDER — DEXTROSE 5 % IV SOLN: INTRAVENOUS | Status: DC

## 2023-11-11 MED ORDER — DILTIAZEM HCL-DEXTROSE 125-5 MG/125ML-% IV SOLN (PREMIX)
5.0000 mg/h | INTRAVENOUS | Status: DC
  Administered 2023-11-11: 5 mg/h via INTRAVENOUS
  Filled 2023-11-11: qty 125

## 2023-11-11 MED ORDER — FUROSEMIDE 10 MG/ML IJ SOLN
40.0000 mg | Freq: Once | INTRAMUSCULAR | Status: AC
  Administered 2023-11-11: 40 mg via INTRAVENOUS
  Filled 2023-11-11: qty 4

## 2023-11-11 MED ORDER — DEXTROSE 50 % IV SOLN
INTRAVENOUS | Status: AC
  Administered 2023-11-11: 50 mL via INTRAVENOUS
  Filled 2023-11-11: qty 50

## 2023-11-11 MED ORDER — DIPHENHYDRAMINE HCL 50 MG/ML IJ SOLN: 25.0000 mg | INTRAMUSCULAR | Status: DC | PRN

## 2023-11-11 MED ORDER — DILTIAZEM HCL 60 MG PO TABS
60.0000 mg | ORAL_TABLET | Freq: Four times a day (QID) | ORAL | Status: DC
  Filled 2023-11-11: qty 1

## 2023-11-11 MED ORDER — POTASSIUM CHLORIDE CRYS ER 20 MEQ PO TBCR
40.0000 meq | EXTENDED_RELEASE_TABLET | Freq: Two times a day (BID) | ORAL | Status: DC
  Administered 2023-11-11: 40 meq via ORAL
  Filled 2023-11-11: qty 2

## 2023-11-11 MED ORDER — ONDANSETRON 4 MG PO TBDP: 4.0000 mg | ORAL_TABLET | Freq: Four times a day (QID) | ORAL | Status: DC | PRN

## 2023-11-11 MED ORDER — K PHOS MONO-SOD PHOS DI & MONO 155-852-130 MG PO TABS
500.0000 mg | ORAL_TABLET | Freq: Once | ORAL | Status: AC
  Administered 2023-11-11: 500 mg via ORAL
  Filled 2023-11-11: qty 2

## 2023-11-11 MED ORDER — ALBUMIN HUMAN 25 % IV SOLN
25.0000 g | Freq: Once | INTRAVENOUS | Status: AC
  Administered 2023-11-11: 25 g via INTRAVENOUS
  Filled 2023-11-11: qty 100

## 2023-11-11 MED ORDER — DILTIAZEM HCL ER COATED BEADS 240 MG PO CP24
240.0000 mg | ORAL_CAPSULE | Freq: Every day | ORAL | Status: DC
  Administered 2023-11-11: 240 mg via ORAL
  Filled 2023-11-11: qty 1

## 2023-11-11 MED ORDER — GLYCOPYRROLATE 1 MG PO TABS: 1.0000 mg | ORAL_TABLET | ORAL | Status: DC | PRN

## 2023-11-11 MED ORDER — DIGOXIN 125 MCG PO TABS
0.1250 mg | ORAL_TABLET | Freq: Every day | ORAL | Status: DC
  Administered 2023-11-11: 0.125 mg via ORAL
  Filled 2023-11-11: qty 1

## 2023-11-11 MED ORDER — DILTIAZEM LOAD VIA INFUSION
10.0000 mg | Freq: Once | INTRAVENOUS | Status: AC
  Administered 2023-11-11: 10 mg via INTRAVENOUS
  Filled 2023-11-11: qty 10

## 2023-11-11 MED ORDER — PERFLUTREN LIPID MICROSPHERE
1.0000 mL | INTRAVENOUS | Status: DC | PRN
  Administered 2023-11-11: 3 mL via INTRAVENOUS

## 2023-11-11 MED ORDER — DEXTROSE 5 % IV SOLN: 20.0000 mL/h | INTRAVENOUS | Status: DC

## 2023-11-11 MED ORDER — ONDANSETRON HCL 4 MG/2ML IJ SOLN: 4.0000 mg | Freq: Four times a day (QID) | INTRAMUSCULAR | Status: DC | PRN

## 2023-11-11 MED ORDER — ACETAMINOPHEN 325 MG PO TABS: 650.0000 mg | ORAL_TABLET | Freq: Four times a day (QID) | ORAL | Status: DC | PRN

## 2023-11-11 MED ORDER — CHLORHEXIDINE GLUCONATE CLOTH 2 % EX PADS
6.0000 | MEDICATED_PAD | Freq: Every day | CUTANEOUS | Status: DC
  Administered 2023-11-11: 6 via TOPICAL

## 2023-11-11 NOTE — Progress Notes (Signed)
   11/11/23 1120  Assess: MEWS Score  Temp 97.6 F (36.4 C)  BP (!) 135/108  MAP (mmHg) 117  Pulse Rate (!) 130  Resp 20  SpO2 93 %  O2 Device Nasal Cannula  O2 Flow Rate (L/min) 4 L/min  Assess: MEWS Score  MEWS Temp 0  MEWS Systolic 0  MEWS Pulse 3  MEWS RR 0  MEWS LOC 0  MEWS Score 3  MEWS Score Color Yellow  Notify: Charge Nurse/RN  Name of Charge Nurse/RN Notified Walterine Gunther RN  Provider Notification  Provider Name/Title Gillermo Lack DO  Date Provider Notified 11/11/23  Time Provider Notified 1120  Method of Notification Face-to-face  Notification Reason Change in status (elevated HR in 150s-180s)  Provider response At bedside;See new orders  Date of Provider Response 11/11/23  Time of Provider Response 1132  Assess: SIRS CRITERIA  SIRS Temperature  0  SIRS Respirations  0  SIRS Pulse 1  SIRS WBC 0  SIRS Score Sum  1

## 2023-11-11 NOTE — Progress Notes (Signed)
 PROGRESS NOTE    [name]  VHQ:[id number] DOB: 1930/10/20 DOA: 11/09/2023 PCP: Tena Feeling, MD   Brief Narrative:  The patient is an elderly 89 year old Caucasian female with past medical history significant for atrial fibrillation on anticoagulation, bronchiectasis, COPD, GERD, dementia and hearing loss, history of PVCs presented the ED with worsening cough and progressive shortness of breath for the last few days.  Patient is pleasantly demented and was unable to give a subjective history but it was noted that she had no pain and had some nausea and but no diarrhea.  She is also noted of poor p.o. intake and lives in a facility at [facility].  Upon arrival she had a temperature of 100.8 and O2 saturation of 85% on room air.  Further workup was done and CT chest PE done and showed no evidence of pulm embolus but did show diffuse bilateral tree-in-bud opacities consistent with laboratory changes with MAC being considered and chronic scarring at the apices.  Respiratory virus panel was positive for parainfluenza virus.  She was placed on nebs, oxygen, antibiotics but then clinically decompensated and was moved to the stepdown unit.  Patient went into A-fib with RVR and heart rates were difficult to control.  She was placed on Cardizem  short-term however became hypotensive.  The case was discussed with cardiology who recommended keeping her running tach eager and they would see the patient tomorrow but recommended p.o. control given her multiple failures of flecainide  and amiodarone .  Echocardiogram was done and showed an EF of 25 to 30%.  Given her continued worsening and decompensation the case was discussed with the patient's daughter and the daughter elected for comfort measures.  All medications on patient's comfort were discontinued.  Patient is at high risk for further decompensation and and possible in-hospital death and referral been made to residential hospice.  Assessment and  Plan:  Acute Respiratory Failure with Hypoxia in the setting of Bronchiectasis and CAP with Parainfluenza virus ruling out bacterial secondary infection /acute exacerbation of COPD: CTA done and as above showed no PE but did show diffuse bilateral tree-in-bud opacities consistent with possible MAI and chronic scarring.  Continue with pulmonary toileting and add guaifenesin 1200 mL p.o. twice daily, flutter valve, suggest pulmonary.  Continuing antibiotics and she has been changed to ceftriaxone  and doxycycline for now.  Continue with Xopenex /Atrovent .  Repeat chest x-ray in the AM.  She will need an amatory home O2 prior to discharge.  Continue supplemental oxygen via nasal cannula.  Getting Solu-Medrol and received Solu-Medrol 125 mg x 1 and now getting 40 every 12 however all medications now been discontinued that are not in the patient's comfort given that she is being transitioned to comfort care.  Paroxysmal Atrial Fibrillation now with RVR: Resume anticoagulation with Apixaban .  TSH was 1.071.  Resumed her home digoxin  and Cardizem  but became extremely tachycardic with sustained rates in the 150s and 60s.  Cardizem  drip was initiated however had to be held given her hypotension.  Echocardiogram was done and showed an EF of 25 to 30% she was transferred to the stepdown unit and cardiologist was, however after further goals of care discussion decision was made to transition to comfort care.  All medications not the patient's comfort have been discontinued.  GERD/GI prophylaxis: Continue PPI w/ Pantoprazole   Hypokalemia: Mild and K+ is now 3.5.  Will not continue to monitor and replete given that we will transition her to comfort care.  Dementia: Will place on  delirium precaution; was having some agitation  Normocytic Anemia: Hgb/Hct went from 12.4/37.1 -> 10.9/33.9 -> 10.6/34.9 is now 10.3/34.0. Checked Anemia Panel and showed an iron level of 33, UIBC was 87, TIBC 120, saturation was low at 27%,  ferritin level 313, folate levels 4.9 and vitamin B12 was 230.  CTM for S/Sx of bleeding; No overt bleeding noted.  Will not repeat CBC given transition to comfort care  Thrombocytopenia: Plt Count went from 211 -> 147 -> 146 and is now 163. CTM for S/Sx of Bleeding; No overt bleeding noted.  Will repeat CBC given transition to comfort care  Hypoalbuminemia: Patient's Albumin Level went from 3.1 -> 3.7 -> 2.9 -> 2.6 and is now 2.5. CTM and Trend and repeat CMP in the AM   DVT prophylaxis: SCDs Start: 11/09/23 2353    Code Status: Do not attempt resuscitation (DNR) - Comfort care Family Communication: Discussed with the daughter over the telephone  Disposition Plan:  Level of care: Palliative Care Status is: Inpatient Remains inpatient appropriate because: Has a high risk for further decompensation given that she is being transitioned to comfort care now   Consultants:  Palliative care Discussing with cardiology  Procedures:  As delineated as above Echocardiogram  Antimicrobials:  Anti-infectives (From admission, onward)    Start     Dose/Rate Route Frequency Ordered Stop   11/10/23 1600  cefTRIAXone  (ROCEPHIN ) 2 g in sodium chloride  0.9 % 100 mL IVPB  Status:  Discontinued        2 g 200 mL/hr over 30 Minutes Intravenous Every 24 hours 11/10/23 0618 11/11/23 1708   11/10/23 0045  doxycycline (VIBRAMYCIN) 100 mg in sodium chloride  0.9 % 250 mL IVPB  Status:  Discontinued        100 mg 125 mL/hr over 120 Minutes Intravenous Every 12 hours 11/09/23 2355 11/11/23 1708   11/09/23 1745  cefTRIAXone  (ROCEPHIN ) 2 g in sodium chloride  0.9 % 100 mL IVPB        2 g 200 mL/hr over 30 Minutes Intravenous Once 11/09/23 1734 11/09/23 1910   11/09/23 1745  azithromycin (ZITHROMAX) 500 mg in sodium chloride  0.9 % 250 mL IVPB        500 mg 250 mL/hr over 60 Minutes Intravenous  Once 11/09/23 1734 11/09/23 1910       Subjective: Seen and examined at bedside and she is remains pleasantly  confused.  Heart rate was uncontrolled and she is feeling a little short of breath.  Transition to stepdown unit and had worsening shortness of breath.  Discussed with the daughter over the telephone and she decided to transition to comfort care.  All medications not patient's comfort have been discontinued.  Objective: Vitals:   11/11/23 2023 11/11/23 2031 11/11/23 2034 11/11/23 2123  BP: (!) 70/52     Pulse: (!) 118   (!) 111  Resp: (!) 38   (!) 38  Temp:      TempSrc:      SpO2: 96% 91% 91% 93%  Weight:      Height:        Intake/Output Summary (Last 24 hours) at 11/11/2023 2311 Last data filed at 11/11/2023 1704 Gross per 24 hour  Intake 592.43 ml  Output 400 ml  Net 192.43 ml   Filed Weights   11/09/23 1726  Weight: 39.5 kg   Examination: Physical Exam:  Constitutional: Thin elderly chronically ill-appearing Caucasian female who appears ill and tachypneic and short of breath Respiratory: Diminished to auscultation bilaterally  with coarse breath sounds and has some crackles and some rhonchi.  Has a increased, respiratory effort and is wearing supplemental oxygen nasal cannula Cardiovascular: RRR, no murmurs / rubs / gallops. S1 and S2 auscultated. No extremity edema  Abdomen: Soft, non-tender, non-distended. Bowel sounds positive.  GU: Deferred. Musculoskeletal: No clubbing / cyanosis of digits/nails. No joint deformity upper and lower extremities.  Skin: No rashes, lesions, ulcers limited skin evaluation. No induration; Warm and dry.  Neurologic: CN 2-12 grossly intact with no focal deficits. Romberg sign and cerebellar reflexes not assessed.  Psychiatric: Impaired judgment and insight and appears little uncomfortable  Data Reviewed: I have personally reviewed following labs and imaging studies  CBC: Recent Labs  Lab 11/09/23 1738 11/10/23 0041 11/10/23 0834 11/11/23 0524  WBC 11.0* 5.6 5.0 6.1  NEUTROABS 9.8*  --  4.6 5.6  HGB 12.4 10.9* 10.6* 10.3*  HCT 37.1  33.9* 34.9* 34.0*  MCV 92.3 92.6 94.8 96.0  PLT 211 147* 146* 163   Basic Metabolic Panel: Recent Labs  Lab 11/09/23 1738 11/10/23 0041 11/10/23 0834 11/11/23 0524  NA 138 139 138 142  K 3.6 3.2* 3.1* 3.5  CL 95* 99 99 106  CO2 27 29 29 29   GLUCOSE 143* 138* 107* 121*  BUN 14 13 13 14   CREATININE 0.62 0.60 0.40* 0.39*  CALCIUM 9.5 8.5* 7.9* 8.1*  MG  --   --   --  1.9  PHOS  --   --   --  2.3*   GFR: Estimated Creatinine Clearance: 29.1 mL/min (A) (by C-G formula based on SCr of 0.39 mg/dL (L)). Liver Function Tests: Recent Labs  Lab 11/09/23 1738 11/10/23 0041 11/10/23 0834 11/11/23 0524  AST 30 20 19 18   ALT 9 11 11 11   ALKPHOS 98 65 64 56  BILITOT 0.5 0.9 0.8 0.7  PROT 7.0 5.8* 5.6* 5.3*  ALBUMIN 3.7 2.9* 2.6* 2.5*   No results for input(s): LIPASE, AMYLASE in the last 168 hours. No results for input(s): AMMONIA in the last 168 hours. Coagulation Profile: Recent Labs  Lab 11/09/23 1738  INR 1.2   Cardiac Enzymes: No results for input(s): CKTOTAL, CKMB, CKMBINDEX, TROPONINI in the last 168 hours. BNP (last 3 results) No results for input(s): PROBNP in the last 8760 hours. HbA1C: Recent Labs    11/10/23 0834  HGBA1C 5.2   CBG: Recent Labs  Lab 11/11/23 0723 11/11/23 1117 11/11/23 1608 11/11/23 1640 11/11/23 2012  GLUCAP 112* 137* 54* 233* 131*   Lipid Profile: No results for input(s): CHOL, HDL, LDLCALC, TRIG, CHOLHDL, LDLDIRECT in the last 72 hours. Thyroid  Function Tests: Recent Labs    11/10/23 0834  TSH 1.071   Anemia Panel: Recent Labs    11/11/23 0524  VITAMINB12 230  FOLATE 4.9*  FERRITIN 313*  TIBC 120*  IRON 33  RETICCTPCT 1.8   Sepsis Labs: Recent Labs  Lab 11/09/23 1738 11/09/23 2146 11/10/23 0041  PROCALCITON  --   --  0.49  LATICACIDVEN 2.7* 1.8  --    Recent Results (from the past 240 hours)  Resp panel by RT-PCR (RSV, Flu A&B, Covid) Anterior Nasal Swab     Status: None    Collection Time: 11/09/23  5:38 PM   Specimen: Anterior Nasal Swab  Result Value Ref Range Status   SARS Coronavirus 2 by RT PCR NEGATIVE NEGATIVE Final    Comment: (NOTE) SARS-CoV-2 target nucleic acids are NOT DETECTED.  The SARS-CoV-2 RNA is generally detectable in upper  respiratory specimens during the acute phase of infection. The lowest concentration of SARS-CoV-2 viral copies this assay can detect is 138 copies/mL. A negative result does not preclude SARS-Cov-2 infection and should not be used as the sole basis for treatment or other patient management decisions. A negative result may occur with  improper specimen collection/handling, submission of specimen other than nasopharyngeal swab, presence of viral mutation(s) within the areas targeted by this assay, and inadequate number of viral copies(<138 copies/mL). A negative result must be combined with clinical observations, patient history, and epidemiological information. The expected result is Negative.  Fact Sheet for Patients:  BloggerCourse.com  Fact Sheet for Healthcare Providers:  SeriousBroker.it  This test is no t yet approved or cleared by the [place]  FDA and  has been authorized for detection and/or diagnosis of SARS-CoV-2 by FDA under an Emergency Use Authorization (EUA). This EUA will remain  in effect (meaning this test can be used) for the duration of the COVID-19 declaration under Section 564(b)(1) of the Act, 21 U.S.C.section 360bbb-3(b)(1), unless the authorization is terminated  or revoked sooner.       Influenza A by PCR NEGATIVE NEGATIVE Final   Influenza B by PCR NEGATIVE NEGATIVE Final    Comment: (NOTE) The Xpert Xpress SARS-CoV-2/FLU/RSV plus assay is intended as an aid in the diagnosis of influenza from Nasopharyngeal swab specimens and should not be used as a sole basis for treatment. Nasal washings and aspirates are unacceptable for  Xpert Xpress SARS-CoV-2/FLU/RSV testing.  Fact Sheet for Patients: BloggerCourse.com  Fact Sheet for Healthcare Providers: SeriousBroker.it  This test is not yet approved or cleared by the [place]  FDA and has been authorized for detection and/or diagnosis of SARS-CoV-2 by FDA under an Emergency Use Authorization (EUA). This EUA will remain in effect (meaning this test can be used) for the duration of the COVID-19 declaration under Section 564(b)(1) of the Act, 21 U.S.C. section 360bbb-3(b)(1), unless the authorization is terminated or revoked.     Resp Syncytial Virus by PCR NEGATIVE NEGATIVE Final    Comment: (NOTE) Fact Sheet for Patients: BloggerCourse.com  Fact Sheet for Healthcare Providers: SeriousBroker.it  This test is not yet approved or cleared by the [place]  FDA and has been authorized for detection and/or diagnosis of SARS-CoV-2 by FDA under an Emergency Use Authorization (EUA). This EUA will remain in effect (meaning this test can be used) for the duration of the COVID-19 declaration under Section 564(b)(1) of the Act, 21 U.S.C. section 360bbb-3(b)(1), unless the authorization is terminated or revoked.  Performed at [facility], [address] [place], [place] [address]   Blood Culture (routine x 2)     Status: None (Preliminary result)   Collection Time: 11/09/23  5:40 PM   Specimen: BLOOD LEFT FOREARM  Result Value Ref Range Status   Specimen Description   Final    BLOOD LEFT FOREARM Performed at [facility], [address], [place], [place] [address]    Special Requests   Final    BOTTLES DRAWN AEROBIC AND ANAEROBIC Blood Culture results may not be optimal due to an inadequate volume of blood received in culture bottles Performed at [facility], [address], [place], [place]  [address]    Culture   Final    NO GROWTH < 24 HOURS Performed at [facility], [address], [place], [place] [address]    Report Status PENDING  Incomplete  Blood Culture (routine x 2)  Status: None (Preliminary result)   Collection Time: 11/09/23  5:43 PM   Specimen: Right Antecubital; Blood  Result Value Ref Range Status   Specimen Description   Final    RIGHT ANTECUBITAL Performed at [facility], [address] [place], [place] [address]    Special Requests   Final    BOTTLES DRAWN AEROBIC AND ANAEROBIC Blood Culture results may not be optimal due to an inadequate volume of blood received in culture bottles Performed at [facility], [address], [place], [place] [address]    Culture   Final    NO GROWTH < 24 HOURS Performed at [facility], [address], [place], [place] [address]    Report Status PENDING  Incomplete  Culture, blood (routine x 2) Call MD if unable to obtain prior to antibiotics being given     Status: None (Preliminary result)   Collection Time: 11/10/23 12:41 AM   Specimen: BLOOD  Result Value Ref Range Status   Specimen Description   Final    BLOOD BLOOD LEFT ARM AEROBIC BOTTLE ONLY Performed at [facility], [address], [place], [place] [address]    Special Requests   Final    Blood Culture results may not be optimal due to an inadequate volume of blood received in culture bottles Performed at [facility], [address] [place], [place] [address]    Culture   Final    NO GROWTH 1 DAY Performed at [facility], [address] [place], [place] [address]    Report Status PENDING  Incomplete  Culture, blood (routine x 2) Call MD if unable to obtain prior to antibiotics being given     Status: None (Preliminary result)   Collection Time: 11/10/23 12:41 AM   Specimen: BLOOD  Result Value Ref Range Status   Specimen Description   Final     BLOOD BLOOD RIGHT HAND Performed at [facility], [address], [place], [place] [address]    Special Requests   Final    BOTTLES DRAWN AEROBIC AND ANAEROBIC Blood Culture adequate volume Performed at [facility], [address], [place], [place] [address]    Culture   Final    NO GROWTH 1 DAY Performed at [facility], [address], [place], [place] [address]    Report Status PENDING  Incomplete  Respiratory (~20 pathogens) panel by PCR     Status: Abnormal   Collection Time: 11/10/23 12:23 PM   Specimen: Nasopharyngeal Swab; Respiratory  Result Value Ref Range Status   Adenovirus NOT DETECTED NOT DETECTED Final   Coronavirus 229E NOT DETECTED NOT DETECTED Final    Comment: (NOTE) The Coronavirus on the Respiratory Panel, DOES NOT test for the novel  Coronavirus (2019 nCoV)    Coronavirus HKU1 NOT DETECTED NOT DETECTED Final   Coronavirus NL63 NOT DETECTED NOT DETECTED Final   Coronavirus OC43 NOT DETECTED NOT DETECTED Final   Metapneumovirus NOT DETECTED NOT DETECTED Final   Rhinovirus / Enterovirus NOT DETECTED NOT DETECTED Final   Influenza A NOT DETECTED NOT DETECTED Final   Influenza B NOT DETECTED NOT DETECTED Final   Parainfluenza Virus 1 NOT DETECTED NOT DETECTED Final   Parainfluenza Virus 2 NOT DETECTED NOT DETECTED Final   Parainfluenza Virus 3 DETECTED (A) NOT DETECTED Final   Parainfluenza Virus 4 NOT DETECTED NOT DETECTED Final   Respiratory Syncytial Virus NOT DETECTED NOT DETECTED  Final   Bordetella pertussis NOT DETECTED NOT DETECTED Final   Bordetella Parapertussis NOT DETECTED NOT DETECTED Final   Chlamydophila pneumoniae NOT DETECTED NOT DETECTED Final   Mycoplasma pneumoniae NOT DETECTED NOT DETECTED Final    Comment: Performed at [facility], [address] [place], [place] [address]    Radiology Studies: ECHOCARDIOGRAM COMPLETE Result Date: 11/11/2023    ECHOCARDIOGRAM REPORT   Patient Name:    SHANEISHA BURKEL Date of Exam: 11/11/2023 Medical Rec #:  [id number]      Height:       60.0 in Accession #:    [id number]     Weight:       87.0 lb Date of Birth:  03/27/1931      BSA:          1.310 m Patient Age:    89 years       BP:           169/127 mmHg Patient Gender: F              HR:           110 bpm. Exam Location:  Inpatient Procedure: 2D Echo, Color Doppler, Cardiac Doppler and Intracardiac            Opacification Agent (Both Spectral and Color Flow Doppler were            utilized during procedure). Indications:    Atrial Fibrillation  History:        Patient has no prior history of Echocardiogram examinations and                 Patient has prior history of Echocardiogram examinations, most                 recent 07/03/2015.  Sonographer:    Travis Friedman RDCS Referring Phys: [id number] Jonel Nephew LATIF [facility] IMPRESSIONS  1. Left ventricular ejection fraction, by estimation, is 25 to 30%. The left ventricle has severely decreased function. The left ventricle demonstrates regional wall motion abnormalities. Normal basal function with mid to apical akinesis. Could represent LAD territory infarct vs Takotsubo cardiomyopathy. RV shows similar wall motion abnormality with normal basal function and apical akinesis, suspicious for Takotsubo. The left ventricular internal cavity size was severely dilated. There is mild left ventricular hypertrophy. Left ventricular diastolic parameters are indeterminate.  2. Swirling artifact at LV apex on contrast images suggesting slow flow but no clear thrombus seen  3. Right ventricular systolic function is moderately reduced. Normal basal funtion with apical akinesis. The right ventricular size is normal. There is mildly elevated pulmonary artery systolic pressure. The estimated right ventricular systolic pressure  is 42.5 mmHg.  4. Left atrial size was severely dilated.  5. The mitral valve is normal in structure. Trivial mitral valve regurgitation.  6. Tricuspid valve regurgitation  is mild to moderate.  7. The aortic valve is tricuspid. Aortic valve regurgitation is trivial. Aortic valve sclerosis is present, with no evidence of aortic valve stenosis.  8. The inferior vena cava is dilated in size with <50% respiratory variability, suggesting right atrial pressure of 15 mmHg. FINDINGS  Left Ventricle: Left ventricular ejection fraction, by estimation, is 25 to 30%. The left ventricle has severely decreased function. The left ventricle demonstrates regional wall motion abnormalities. The left ventricular internal cavity size was severely dilated. There is mild left ventricular hypertrophy. Left ventricular diastolic parameters are indeterminate. Right Ventricle: The right ventricular size is normal. No increase in  right ventricular wall thickness. Right ventricular systolic function is moderately reduced. There is mildly elevated pulmonary artery systolic pressure. The tricuspid regurgitant velocity is 2.62 m/s, and with an assumed right atrial pressure of 15 mmHg, the estimated right ventricular systolic pressure is 42.5 mmHg. Left Atrium: Left atrial size was severely dilated. Right Atrium: Right atrial size was normal in size. Pericardium: Trivial pericardial effusion is present. Mitral Valve: The mitral valve is normal in structure. Trivial mitral valve regurgitation. Tricuspid Valve: The tricuspid valve is normal in structure. Tricuspid valve regurgitation is mild to moderate. Aortic Valve: The aortic valve is tricuspid. Aortic valve regurgitation is trivial. Aortic valve sclerosis is present, with no evidence of aortic valve stenosis. Aortic valve mean gradient measures 2.2 mmHg. Aortic valve peak gradient measures 4.0 mmHg. Aortic valve area, by VTI measures 1.72 cm. Pulmonic Valve: The pulmonic valve was not well visualized. Pulmonic valve regurgitation is trivial. Aorta: The aortic root is normal in size and structure. Venous: The inferior vena cava is dilated in size with less than  50% respiratory variability, suggesting right atrial pressure of 15 mmHg. IAS/Shunts: The interatrial septum was not well visualized.  LEFT VENTRICLE PLAX 2D LVIDd:         4.25 cm LVIDs:         3.55 cm LV PW:         0.70 cm LV IVS:        0.70 cm LVOT diam:     1.90 cm LV SV:         17 LV SV Index:   13 LVOT Area:     2.84 cm  LV Volumes (MOD) LV vol d, MOD A2C: 70.8 ml LV vol d, MOD A4C: 117.0 ml LV vol s, MOD A2C: 47.9 ml LV vol s, MOD A4C: 81.0 ml LV SV MOD A2C:     22.9 ml LV SV MOD A4C:     117.0 ml LV SV MOD BP:      28.2 ml RIGHT VENTRICLE             IVC RV Basal diam:  3.60 cm     IVC diam: 1.90 cm RV S prime:     10.00 cm/s TAPSE (M-mode): 1.3 cm LEFT ATRIUM              Index         RIGHT ATRIUM           Index LA diam:        3.70 cm  2.82 cm/m    RA Area:     15.00 cm LA Vol (A2C):   144.0 ml 109.89 ml/m  RA Volume:   36.50 ml  27.85 ml/m LA Vol (A4C):   101.0 ml 77.07 ml/m LA Biplane Vol: 129.0 ml 98.44 ml/m  AORTIC VALVE AV Area (Vmax):    1.99 cm AV Area (Vmean):   1.92 cm AV Area (VTI):     1.72 cm AV Vmax:           100.50 cm/s AV Vmean:          68.775 cm/s AV VTI:            0.100 m AV Peak Grad:      4.0 mmHg AV Mean Grad:      2.2 mmHg LVOT Vmax:         70.60 cm/s LVOT Vmean:        46.600 cm/s LVOT VTI:  0.061 m LVOT/AV VTI ratio: 0.61  AORTA Ao Root diam: 2.70 cm TRICUSPID VALVE TR Peak grad:   27.5 mmHg TR Vmax:        262.00 cm/s  SHUNTS Systemic VTI:  0.06 m Systemic Diam: 1.90 cm Carson Clara MD Electronically signed by Carson Clara MD Signature Date/Time: 11/11/2023/5:00:44 PM    Final    DG CHEST PORT 1 VIEW Result Date: 11/11/2023 CLINICAL DATA:  [id number] SOB (shortness of breath) [id number] EXAM: PORTABLE CHEST 1 VIEW COMPARISON:  November 09, 2023, Oct 23, 2023 FINDINGS: The cardiomediastinal silhouette is unchanged in contour.Atherosclerotic calcifications of the tortuous thoracic aorta. Trace bilateral pleural effusions. No pneumothorax. Similar  appearance of biapical heterogeneous opacities, likely scar. No new focal consolidation. Diffuse tree-in-bud opacities are better visualized on recent CT. IMPRESSION: 1. Trace bilateral pleural effusions. 2. Diffuse tree-in-bud opacities are better visualized on recent CT. Electronically Signed   By: Clancy Crimes M.D.   On: 11/11/2023 07:54   Scheduled Meds:  arformoterol  15 mcg Nebulization BID   budesonide  (PULMICORT ) nebulizer solution  0.25 mg Nebulization BID   Chlorhexidine  Gluconate Cloth  6 each Topical Daily   pantoprazole  40 mg Oral Daily   Continuous Infusions:  dextrose       LOS: 2 days   Aura Leeds, DO [facility] Available via Epic secure chat 7am-7pm After these hours, please refer to coverage provider listed on [email or web address] 11/11/2023, 11:11 PM

## 2023-11-11 NOTE — Progress Notes (Signed)
   11/11/23 1216  Assess: MEWS Score  Temp 97.8 F (36.6 C)  BP 100/85  MAP (mmHg) 92  Pulse Rate (!) 110  ECG Heart Rate (!) 130  Resp (!) 35  SpO2 92 %  O2 Device Nasal Cannula  O2 Flow Rate (L/min) 4 L/min  Assess: MEWS Score  MEWS Temp 0  MEWS Systolic 1  MEWS Pulse 3  MEWS RR 2  MEWS LOC 0  MEWS Score 6  MEWS Score Color Red  Assess: if the MEWS score is Yellow or Red  Were vital signs accurate and taken at a resting state? Yes  Does the patient meet 2 or more of the SIRS criteria? Yes  Does the patient have a confirmed or suspected source of infection? Yes  MEWS guidelines implemented  Yes, red  Treat  MEWS Interventions Considered administering scheduled or prn medications/treatments as ordered  Take Vital Signs  Increase Vital Sign Frequency  Red: Q1hr x2, continue Q4hrs until patient remains green for 12hrs  Escalate  MEWS: Escalate Red: Discuss with charge nurse and notify provider. Consider notifying RRT. If remains red for 2 hours consider need for higher level of care  Provider Notification  Provider Name/Title [facility] DO  Date Provider Notified 11/11/23  Time Provider Notified 1217  Method of Notification Page  Notification Reason Change in status (red mews)  Notify: Rapid Response  Name of Rapid Response RN Notified Catherin Closs  Date Rapid Response Notified 11/11/23  Time Rapid Response Notified 1217  Assess: SIRS CRITERIA  SIRS Temperature  0  SIRS Respirations  1  SIRS Pulse 1  SIRS WBC 0  SIRS Score Sum  2

## 2023-11-11 NOTE — TOC Progression Note (Signed)
 Transition of Care [facility]) - Progression Note    Patient Details  Name: [name] MRN: [id number] Date of Birth: January 04, 1931  [facility] CM/SW Contact  Levie Ream, RN Phone Number: 11/11/2023, 5:13 PM  Clinical Narrative:    Notified by Dr Rosaria Common via secure chat pt has been transitioned to comfort care and family requests residential hospice at [facility]; Jacqlyn Matas, [facility] Liaison also notified; she says pt will be assessed tomorrow; will pass on to oncoming TOC for follow up.   Expected Discharge Plan: Skilled Nursing Facility Barriers to Discharge: Continued Medical Work up  Expected Discharge Plan and Services   Discharge Planning Services: CM Consult   Living arrangements for the past 2 months: Assisted Living Facility                                       Social Determinants of Health (SDOH) Interventions SDOH Screenings   Food Insecurity: No Food Insecurity (11/09/2023)  Housing: Low Risk  (11/09/2023)  Transportation Needs: No Transportation Needs (11/09/2023)  Utilities: Not At Risk (11/09/2023)  Social Connections: Socially Isolated (11/09/2023)  Tobacco Use: Medium Risk (11/09/2023)    Readmission Risk Interventions    11/10/2023    4:39 PM 08/02/2023    4:19 PM  Readmission Risk Prevention Plan  Post Dischage Appt  Complete  Medication Screening  Complete  Transportation Screening Complete Complete  PCP or Specialist Appt within 5-7 Days Complete   Home Care Screening Complete   Medication Review (RN CM) Complete

## 2023-11-11 NOTE — Evaluation (Signed)
 Occupational Therapy Evaluation Patient Details Name: [name] MRN: [id number] DOB: Jul 09, 1930 Today's Date: 11/11/2023   History of Present Illness   89 y.o. female adm 6/13 with medical history significant of  A-fib on chronic anticoagulation, bronchiectasis, COPD, GERD, dementia, hearing loss, PVCs who presents to ED with cough and progressive sob.  Recent hospitalization 3/25 post fall and hip fx.     Clinical Impressions Presents for the diagnosis above.  Patient HOH and with dementia, unable to express herself consistently and only following very simple commands.  OT can try to encourage OOB and self feeding/grooming, simple goals set, recommend return to SNF and OT can screen to determine needs.       If plan is discharge home, recommend the following:   Assistance with feeding     Functional Status Assessment   Patient has had a recent decline in their functional status and demonstrates the ability to make significant improvements in function in a reasonable and predictable amount of time.     Equipment Recommendations   None recommended by OT     Recommendations for Other Services         Precautions/Restrictions   Precautions Precautions: Fall Restrictions Weight Bearing Restrictions Per Provider Order: No     Mobility Bed Mobility Overal bed mobility: Needs Assistance Bed Mobility: Supine to Sit     Supine to sit: Max assist       Patient Response: Restless  Transfers Overall transfer level: Needs assistance   Transfers: Bed to chair/wheelchair/BSC     Squat pivot transfers: Total assist              Balance Overall balance assessment: Needs assistance Sitting-balance support: Feet supported Sitting balance-Leahy Scale: Poor   Postural control: Posterior lean Standing balance support: Bilateral upper extremity supported Standing balance-Leahy Scale: Zero                             ADL either performed or  assessed with clinical judgement   ADL   Eating/Feeding: Minimal assistance;Sitting;Moderate assistance   Grooming: Minimal assistance;Sitting                   Toilet Transfer: Total assistance;Stand-pivot             General ADL Comments: NO real attempt to stand or support her weight.     Vision Baseline Vision/History: 1 Wears glasses Vision Assessment?: No apparent visual deficits     Perception Perception: Not tested       Praxis Praxis: Not tested       Pertinent Vitals/Pain Pain Assessment Pain Assessment: Faces Faces Pain Scale: No hurt Pain Intervention(s): Monitored during session     Extremity/Trunk Assessment Upper Extremity Assessment Upper Extremity Assessment: Generalized weakness   Lower Extremity Assessment Lower Extremity Assessment: Defer to PT evaluation   Cervical / Trunk Assessment Cervical / Trunk Assessment: Kyphotic   Communication Communication Communication: Impaired Factors Affecting Communication: Hearing impaired   Cognition Arousal: Alert Behavior During Therapy: Flat affect Cognition: History of cognitive impairments             OT - Cognition Comments: Dose follow simple commands, baseline dementia                 Following commands: Impaired Following commands impaired: Follows one step commands with increased time     Cueing  General Comments   Cueing Techniques: Verbal cues;Gestural cues  Exercises     Shoulder Instructions      Home Living Family/patient expects to be discharged to:: Skilled nursing facility                                        Prior Functioning/Environment Prior Level of Function : Needs assist;History of Falls (last six months)               ADLs Comments: Assume assist as needed from facility staff.  Chart stated she could feed herself.    OT Problem List: Decreased safety awareness;Impaired balance (sitting and/or standing)   OT  Treatment/Interventions: Self-care/ADL training;Therapeutic activities      OT Goals(Current goals can be found in the care plan section)   Acute Rehab OT Goals OT Goal Formulation: Patient unable to participate in goal setting Time For Goal Achievement: 11/26/23 Potential to Achieve Goals: Fair ADL Goals Pt Will Perform Eating: with set-up;sitting Pt Will Perform Grooming: with set-up;sitting Pt Will Transfer to Toilet: with mod assist;stand pivot transfer;bedside commode   OT Frequency:  Min 2X/week    Co-evaluation              AM-PAC OT 6 Clicks Daily Activity     Outcome Measure Help from another person eating meals?: A Lot Help from another person taking care of personal grooming?: A Little Help from another person toileting, which includes using toliet, bedpan, or urinal?: Total Help from another person bathing (including washing, rinsing, drying)?: Total Help from another person to put on and taking off regular upper body clothing?: A Lot Help from another person to put on and taking off regular lower body clothing?: Total 6 Click Score: 10   End of Session Nurse Communication: Mobility status  Activity Tolerance: Patient tolerated treatment well Patient left: in chair;with call bell/phone within reach;with bed alarm set  OT Visit Diagnosis: Feeding difficulties (R63.3)                Time: 4034-7425 OT Time Calculation (min): 22 min Charges:  OT General Charges $OT Visit: 1 Visit OT Evaluation $OT Eval Moderate Complexity: 1 Mod  11/11/2023  RP, OTR/L  Acute Rehabilitation Services  Office:  [phone]   Benjamen Brand 11/11/2023, 8:39 AM

## 2023-11-11 NOTE — Progress Notes (Signed)
 PT Cancellation Note  Patient Details Name: [name] MRN: [id number] DOB: 07-Sep-1930   Cancelled Treatment:    Reason Eval/Treat Not Completed: Medical issues which prohibited therapy    Jsean Taussig P, PT Acute Rehabilitation  Office: [phone]

## 2023-11-11 NOTE — Progress Notes (Signed)
 Echocardiogram 2D Echocardiogram has been performed.  Tynesha Free N [name],RDCS 11/11/2023, 3:53 PM

## 2023-11-11 NOTE — Progress Notes (Signed)
   11/11/23 1120  Assess: MEWS Score  Temp 97.6 F (36.4 C)  BP (!) 135/108  MAP (mmHg) 117  Pulse Rate (!) 130  Resp 20  SpO2 93 %  O2 Device Nasal Cannula  O2 Flow Rate (L/min) 4 L/min  Assess: MEWS Score  MEWS Temp 0  MEWS Systolic 0  MEWS Pulse 3  MEWS RR 0  MEWS LOC 0  MEWS Score 3  MEWS Score Color Yellow  Assess: if the MEWS score is Yellow or Red  Were vital signs accurate and taken at a resting state? Yes  Does the patient meet 2 or more of the SIRS criteria? No  MEWS guidelines implemented  Yes, yellow  Treat  MEWS Interventions Considered administering scheduled or prn medications/treatments as ordered  Take Vital Signs  Increase Vital Sign Frequency  Yellow: Q2hr x1, continue Q4hrs until patient remains green for 12hrs  Escalate  MEWS: Escalate Yellow: Discuss with charge nurse and consider notifying provider and/or RRT  Notify: Charge Nurse/RN  Name of Charge Nurse/RN Notified Walterine Gunther RN  Provider Notification  Provider Name/Title Gillermo Lack DO  Date Provider Notified 11/11/23  Time Provider Notified 1120  Method of Notification Face-to-face  Notification Reason Change in status (elevated HR in 150s-180s)  Provider response At bedside;See new orders  Date of Provider Response 11/11/23  Time of Provider Response 1132  Assess: SIRS CRITERIA  SIRS Temperature  0  SIRS Respirations  0  SIRS Pulse 1  SIRS WBC 0  SIRS Score Sum  1

## 2023-11-11 NOTE — Progress Notes (Signed)
 Hypoglycemic Event  CBG: 54   Treatment: D50 50 mL (25 gm)  Symptoms: Shaky  Follow-up CBG: Time:1640 CBG Result:233  Possible Reasons for Event: Inadequate meal intake  Comments/MD notified: NT notified RN blood sugar 54. Attempted to get patient to drink [employer]. Unable to achieve adequate intake. D50 ordered and given. Notified Sheikh, DO.     Abbe Abate

## 2023-11-12 DIAGNOSIS — B348 Other viral infections of unspecified site: Secondary | ICD-10-CM | POA: Diagnosis not present

## 2023-11-12 DIAGNOSIS — J9601 Acute respiratory failure with hypoxia: Secondary | ICD-10-CM | POA: Diagnosis not present

## 2023-11-12 DIAGNOSIS — Z515 Encounter for palliative care: Secondary | ICD-10-CM | POA: Diagnosis not present

## 2023-11-12 DIAGNOSIS — I480 Paroxysmal atrial fibrillation: Secondary | ICD-10-CM | POA: Diagnosis not present

## 2023-11-12 LAB — GLUCOSE, CAPILLARY: Glucose-Capillary: 65 mg/dL — ABNORMAL LOW (ref 70–99)

## 2023-11-12 MED ORDER — LORAZEPAM 2 MG/ML IJ SOLN
1.0000 mg | INTRAMUSCULAR | Status: DC | PRN
  Administered 2023-11-12: 1 mg via INTRAVENOUS
  Filled 2023-11-12: qty 1

## 2023-11-12 NOTE — Progress Notes (Signed)
 PT Note  Patient Details Name: [name] MRN: [id number] DOB: 1931-05-05   Cancelled Treatment:    Reason Eval/Treat Not Completed: Medical issues which prohibited therapy. Pt transitioned to comfort care and family is requesting residential hospice services. Pt is not currently appropriate for therapy. PT to sign off at this time due to no PT needs. Thank you for this referral.   Cary Clarks, PT Acute Rehab   Annalee Kiang 11/12/2023, 9:57 AM

## 2023-11-12 NOTE — Progress Notes (Signed)
 Called facility and report given to [facility], RN.

## 2023-11-12 NOTE — Plan of Care (Signed)
  Problem: Education: Goal: Knowledge of General Education information will improve Description: Including pain rating scale, medication(s)/side effects and non-pharmacologic comfort measures Outcome: Completed/Met   Problem: Health Behavior/Discharge Planning: Goal: Ability to manage health-related needs will improve Outcome: Completed/Met   Problem: Clinical Measurements: Goal: Ability to maintain clinical measurements within normal limits will improve Outcome: Completed/Met Goal: Will remain free from infection Outcome: Completed/Met Goal: Diagnostic test results will improve Outcome: Completed/Met Goal: Respiratory complications will improve Outcome: Completed/Met Goal: Cardiovascular complication will be avoided Outcome: Completed/Met   Problem: Activity: Goal: Risk for activity intolerance will decrease Outcome: Completed/Met   Problem: Nutrition: Goal: Adequate nutrition will be maintained Outcome: Completed/Met   Problem: Coping: Goal: Level of anxiety will decrease Outcome: Completed/Met   Problem: Elimination: Goal: Will not experience complications related to bowel motility Outcome: Completed/Met Goal: Will not experience complications related to urinary retention Outcome: Completed/Met   Problem: Pain Managment: Goal: General experience of comfort will improve and/or be controlled Outcome: Completed/Met   Problem: Safety: Goal: Ability to remain free from injury will improve Outcome: Completed/Met   Problem: Skin Integrity: Goal: Risk for impaired skin integrity will decrease Outcome: Completed/Met   Problem: Activity: Goal: Ability to tolerate increased activity will improve Outcome: Completed/Met   Problem: Clinical Measurements: Goal: Ability to maintain a body temperature in the normal range will improve Outcome: Completed/Met   Problem: Respiratory: Goal: Ability to maintain adequate ventilation will improve Outcome: Completed/Met Goal:  Ability to maintain a clear airway will improve Outcome: Completed/Met   Problem: Activity: Goal: Ability to tolerate increased activity will improve Outcome: Completed/Met   Problem: Clinical Measurements: Goal: Ability to maintain a body temperature in the normal range will improve Outcome: Completed/Met   Problem: Respiratory: Goal: Ability to maintain adequate ventilation will improve Outcome: Completed/Met Goal: Ability to maintain a clear airway will improve Outcome: Completed/Met

## 2023-11-12 NOTE — Care Management Important Message (Signed)
 Important Message  Patient Details IM Letter not given due to Comfort Care. Name: [name] MRN: [id number] Date of Birth: [birth year]   Important Message Given:  No     Curtiss Dowdy 11/12/2023, 1:42 PM

## 2023-11-12 NOTE — Consult Note (Signed)
 Consultation Note Date: 11/12/2023   Patient Name: [name]  DOB: 03/23/1931  MRN: [id number]  Age / Sex: 89 y.o., female  PCP: Tena Feeling, MD Referring Physician: Eveline Hipps, DO  Reason for Consultation: Terminal Care  HPI/Patient Profile: 89 y.o. female  admitted on 11/09/2023.  The patient is an elderly 89 year old Caucasian female with past medical history significant for atrial fibrillation on anticoagulation, bronchiectasis, COPD, GERD, dementia and hearing loss, history of PVCs presented the ED with worsening cough and progressive shortness of breath for the last few days.  Patient is pleasantly demented and was unable to give a subjective history but it was noted that she had no pain and had some nausea and but no diarrhea.  She is also noted of poor p.o. intake and lives in a facility at [facility].  Upon arrival she had a temperature of 100.8 and O2 saturation of 85% on room air.  Further workup was done and CT chest PE done and showed no evidence of pulm embolus but did show diffuse bilateral tree-in-bud opacities consistent with laboratory changes with MAC being considered and chronic scarring at the apices.  Respiratory virus panel was positive for parainfluenza virus.  She was placed on nebs, oxygen, antibiotics but then clinically decompensated and was moved to the stepdown unit.  Patient went into A-fib with RVR and heart rates were difficult to control.  She was placed on Cardizem  short-term however became hypotensive.  The case was discussed with cardiology who recommended keeping her running tach eager and they would see the patient tomorrow but recommended p.o. control given her multiple failures of flecainide  and amiodarone .   Echocardiogram was done and showed an EF of 25 to 30%.  Given her continued worsening and decompensation the case was discussed with the patient's daughter  and the daughter elected for comfort measures.  All medications on patient's comfort were discontinued.  Patient is at high risk for further decompensation and and possible in-hospital death and referral been made to residential hospice. Clinical Assessment and Goals of Care:  Acute Respiratory Failure with Hypoxia in the setting of Bronchiectasis and CAP with Parainfluenza virus ruling out bacterial secondary infection /acute exacerbation of COPD:  Paroxysmal Atrial Fibrillation now with RVR  Dyspnea Tachypnea Comfort measures.   Palliative medicine is specialized medical care for people living with serious illness. It focuses on providing relief from the symptoms and stress of a serious illness. The goal is to improve quality of life for both the patient and the family. Goals of care: Broad aims of medical therapy in relation to the patient's values and preferences. Our aim is to provide medical care aimed at enabling patients to achieve the goals that matter most to them, given the circumstances of their particular medical situation and their constraints.   GUIDE dementia program patient, [facility] was also involved. Goals of care and disposition options discussed with daughter at bedside.   HCPOA  Daughter.   SUMMARY OF RECOMMENDATIONS    Agree with  DNR DNI comfort measures Agree with residential hospice.  Will reach out to [facility] liaison to coordinate evaluation for [facility] D/C PO Protonix Add IV PRN Ativan  and monitor Continue IV Morphine  PRN for pain/dyspnea PPS 30%  Code Status/Advance Care Planning: DNR   Symptom Management:     Palliative Prophylaxis:  Delirium Protocol  Additional Recommendations (Limitations, Scope, Preferences): Full Comfort Care  Psycho-social/Spiritual:  Desire for further Chaplaincy support:yes Additional Recommendations: Education on Hospice  Prognosis:  < 2 weeks  Discharge Planning: Hospice facility      Primary  Diagnoses: Present on Admission:  Acute respiratory failure with hypoxia (HCC)   I have reviewed the medical record, interviewed the patient and family, and examined the patient. The following aspects are pertinent.  Past Medical History:  Diagnosis Date   A-fib (HCC)    Bronchiectasis (HCC)    with multiple pneumonia back in 1990's   Bronchitis    episodic   DJD (degenerative joint disease) of knee    GERD (gastroesophageal reflux disease)    episodic symptoms   Hearing loss    chronic moderate hearing loss, worked up by ENT and hearing aids have been recommended   History of nuclear stress test 03/29/2006   NORMAL   Insomnia    PVC's (premature ventricular contractions)    on EKG, controlled on atenolol and caffeine reduction   Tinnitus    chronic   Social History   Socioeconomic History   Marital status: Divorced    Spouse name: Not on file   Number of children: Not on file   Years of education: Not on file   Highest education level: Not on file  Occupational History   Occupation: retired  Tobacco Use   Smoking status: Former    Current packs/day: 0.00    Average packs/day: 0.5 packs/day for 20.0 years (10.0 ttl pk-yrs)    Types: Cigarettes    Start date: 05/29/1948    Quit date: 05/29/1968    Years since quitting: 55.4   Smokeless tobacco: Never   Tobacco comments:    Former smoker 08/13/23  Vaping Use   Vaping status: Never Used  Substance and Sexual Activity   Alcohol use: Yes    Alcohol/week: 0.0 standard drinks of alcohol    Comment: 1 can of beer at night   Drug use: No   Sexual activity: Not on file  Other Topics Concern   Not on file  Social History Narrative   Not on file   Social Drivers of Health   Financial Resource Strain: Not on file  Food Insecurity: No Food Insecurity (11/09/2023)   Hunger Vital Sign    Worried About Running Out of Food in the Last Year: Never true    Ran Out of Food in the Last Year: Never true  Transportation Needs:  No Transportation Needs (11/09/2023)   PRAPARE - Administrator, Civil Service (Medical): No    Lack of Transportation (Non-Medical): No  Physical Activity: Not on file  Stress: Not on file  Social Connections: Socially Isolated (11/09/2023)   Social Connection and Isolation Panel    Frequency of Communication with Friends and Family: Three times a week    Frequency of Social Gatherings with Friends and Family: Once a week    Attends Religious Services: Never    Database administrator or Organizations: No    Attends Banker Meetings: Never    Marital Status: Divorced   Family History  Problem Relation Age of Onset   Stroke Father    Hypertension Father    Stroke Mother    Hypertension Mother    Asthma Sister    Clotting disorder Sister    Heart attack Neg Hx    Scheduled Meds:  arformoterol  15 mcg Nebulization BID   budesonide  (PULMICORT ) nebulizer solution  0.25 mg Nebulization BID   Chlorhexidine  Gluconate Cloth  6 each Topical Daily   pantoprazole  40 mg Oral Daily   Continuous Infusions:  dextrose      PRN Meds:.acetaminophen  **OR** acetaminophen , artificial tears, diphenhydrAMINE, glycopyrrolate **OR** glycopyrrolate **OR** glycopyrrolate, levalbuterol , LORazepam , morphine  injection, ondansetron  **OR** ondansetron  (ZOFRAN ) IV Medications Prior to Admission:  Prior to Admission medications   Medication Sig Start Date End Date Taking? Authorizing Provider  acetaminophen  (TYLENOL ) 500 MG tablet Take 1,000 mg by mouth every 8 (eight) hours as needed for moderate pain (pain score 4-6) or mild pain (pain score 1-3).   Yes [provider]  apixaban  (ELIQUIS ) 2.5 MG TABS tablet TAKE 1 TABLET(2.5 MG) BY MOUTH TWICE DAILY 06/01/22  Yes Debbie Fails, PA-C  Cholecalciferol  (VITAMIN D3) 2000 units TABS Take 2,000 Units by mouth daily.   Yes [provider]  digoxin  (LANOXIN ) 0.125 MG tablet Take 1 tablet (0.125 mg total) by mouth daily.  08/08/23  Yes Danford, Willis Harter, MD  diltiazem  (CARDIZEM  CD) 240 MG 24 hr capsule Take 1 capsule (240 mg total) by mouth daily. 08/08/23  Yes Danford, Willis Harter, MD  ipratropium-albuterol  (DUONEB) 0.5-2.5 (3) MG/3ML SOLN Take 3 mLs by nebulization every 6 (six) hours as needed. Patient taking differently: Take 3 mLs by nebulization every 6 (six) hours as needed (wheezing/SOB). 05/18/23  Yes Cobb, Mariah Shines, NP  sennosides-docusate sodium  (SENOKOT-S) 8.6-50 MG tablet Take 1 tablet by mouth in the morning and at bedtime.   Yes [provider]  sertraline  (ZOLOFT ) 50 MG tablet Take 50 mg by mouth daily.   Yes [provider]   Allergies  Allergen Reactions   Augmentin [Amoxicillin-Pot Clavulanate] Nausea And Vomiting   Biaxin [Clarithromycin] Nausea And Vomiting   Metoprolol  Other (See Comments)    Patient notes she feels funny after taking it   Tequin [Gatifloxacin] Nausea Only   Review of Systems Shortness of breath Physical Exam Awake alert Mild look of fear/air hunger evident on face Thinly built  Vital Signs: BP (!) 67/58 (BP Location: Right Arm)   Pulse 86   Temp 97.6 F (36.4 C) (Oral)   Resp 18   Ht 5' (1.524 m)   Wt 39.5 kg   SpO2 92%   BMI 16.99 kg/m  Pain Scale: 0-10   Pain Score: 0-No pain   SpO2: SpO2: 92 % O2 Device:SpO2: 92 % O2 Flow Rate: .O2 Flow Rate (L/min): 4 L/min  IO: Intake/output summary:  Intake/Output Summary (Last 24 hours) at 11/12/2023 1005 Last data filed at 11/12/2023 0244 Gross per 24 hour  Intake 592.43 ml  Output --  Net 592.43 ml    LBM: Last BM Date : 11/09/23 Baseline Weight: Weight: 39.5 kg Most recent weight: Weight: 39.5 kg     Palliative Assessment/Data:   PPS 30%  Time In:  9 Time Out:  10 Time Total:  60 Greater than 50%  of this time was spent counseling and coordinating care related to the above assessment and plan.  Signed by: Lujean Sake, MD   Please contact Palliative Medicine Team  phone at [phone] for questions and concerns.  For individual provider: See Tilford Foley

## 2023-11-12 NOTE — Discharge Summary (Signed)
 Physician Discharge Summary   Patient: [name] MRN: [id number] DOB: 1930-06-07  Admit date:     11/09/2023  Discharge date: 11/12/23  Discharge Physician: Aura Leeds, DO   PCP: Tena Feeling, MD   Recommendations at discharge:    Further care and Follow up care per Hospice Protocol.   Discharge Diagnoses: Principal Problem:   Acute respiratory failure with hypoxia (HCC)  Resolved Problems:   * No resolved hospital problems. [facility] Course: The patient is an elderly 89 year old Caucasian female with past medical history significant for atrial fibrillation on anticoagulation, bronchiectasis, COPD, GERD, dementia and hearing loss, history of PVCs presented the ED with worsening cough and progressive shortness of breath for the last few days.  Patient is pleasantly demented and was unable to give a subjective history but it was noted that she had no pain and had some nausea and but no diarrhea.  She is also noted of poor p.o. intake and lives in a facility at [facility].  Upon arrival she had a temperature of 100.8 and O2 saturation of 85% on room air.  Further workup was done and CT chest PE done and showed no evidence of pulm embolus but did show diffuse bilateral tree-in-bud opacities consistent with laboratory changes with MAC being considered and chronic scarring at the apices.  Respiratory virus panel was positive for parainfluenza virus.  She was placed on nebs, oxygen, antibiotics but then clinically decompensated and was moved to the stepdown unit.  Patient went into A-fib with RVR and heart rates were difficult to control.  She was placed on Cardizem  short-term however became hypotensive.  The case was discussed with cardiology who recommended keeping her running tach eager and they would see the patient tomorrow but recommended p.o. control given her multiple failures of flecainide  and amiodarone .  Echocardiogram was done and showed an EF of 25 to 30%.  Given her  continued worsening and decompensation the case was discussed with the patient's daughter and the daughter elected for comfort measures.  All medications on patient's comfort were discontinued.  Patient is at high risk for further decompensation and and possible in-hospital death and referral been made to residential hospice.  She has been accepted to residential hospice and will be transferred to [facility] for end-of-life care later today  Assessment and Plan:  Acute Respiratory Failure with Hypoxia in the setting of Bronchiectasis and CAP with Parainfluenza virus ruling out bacterial secondary infection /acute exacerbation of COPD: CTA done and as above showed no PE but did show diffuse bilateral tree-in-bud opacities consistent with possible MAI and chronic scarring.  Continue with pulmonary toileting and add guaifenesin 1200 mL p.o. twice daily, flutter valve, suggest pulmonary.  Continuing antibiotics and she has been changed to ceftriaxone  and doxycycline for now.  Continue with Xopenex /Atrovent .  Repeat chest x-ray in the AM.  She will need an amatory home O2 prior to discharge.  Continue supplemental oxygen via nasal cannula.  Getting Solu-Medrol and received Solu-Medrol 125 mg x 1 and now getting 40 every 12 however all medications now been discontinued that are not in the patient's comfort given that she is being transitioned to comfort care.  Paroxysmal Atrial Fibrillation now with RVR: Resume anticoagulation with Apixaban .  TSH was 1.071.  Resumed her home digoxin  and Cardizem  but became extremely tachycardic with sustained rates in the 150s and 60s.  Cardizem  drip was initiated however had to be held given her hypotension.  Echocardiogram was done and showed  an EF of 25 to 30% she was transferred to the stepdown unit and cardiologist was, however after further goals of care discussion decision was made to transition to comfort care.  All medications not the patient's comfort have been  discontinued.  GERD/GI prophylaxis: D/C'd PPI w/ Pantoprazole given her transition to Comfort Care  Hypokalemia: Mild and K+ is now 3.5 on last check.  Will not continue to monitor and replete given that we will transition her to comfort care.  Hypoglycemia: Will not check any more point-of-care blood sugars.  In the setting of not eating very much and transitioning to comfort.  CBGs ranged from 65-137. C/w Comfort feeds if aligns with GOC.   Dementia: Placed on delirium precaution; was having some agitation; Now meds as above as she is being transitioned to Comfort Care  Normocytic Anemia: Hgb/Hct went from 12.4/37.1 -> 10.9/33.9 -> 10.6/34.9 is now 10.3/34.0. Checked Anemia Panel and showed an iron level of 33, UIBC was 87, TIBC 120, saturation was low at 27%, ferritin level 313, folate levels 4.9 and vitamin B12 was 230.  CTM for S/Sx of bleeding; No overt bleeding noted.  Will not repeat CBC given transition to comfort care  Thrombocytopenia: Plt Count went from 211 -> 147 -> 146 and is now 163 on last check. CTM for S/Sx of Bleeding; No overt bleeding noted.  Will NOT repeat CBC given transition to comfort care  Hypoalbuminemia: Patient's Albumin Level went from 3.1 -> 3.7 -> 2.9 -> 2.6 and is now 2.5. Will NOT CTM and Trend and repeat CMP in the AM given transition to Comfort Care  Active Pressure Injury/Wound(s)     None          Consultants: Palliative Care Medicine; Cancelled Cardiology consult as she was transitioned to Comfort Care  Procedures performed: ECHO   Disposition: Hospice care  Diet recommendation:  Regular diet DISCHARGE MEDICATION: Allergies as of 11/12/2023       Reactions   Augmentin [amoxicillin-pot Clavulanate] Nausea And Vomiting   Biaxin [clarithromycin] Nausea And Vomiting   Metoprolol  Other (See Comments)   Patient notes she feels funny after taking it   Tequin [gatifloxacin] Nausea Only        Medication List     STOP taking these  medications    acetaminophen  500 MG tablet Commonly known as: TYLENOL    apixaban  2.5 MG Tabs tablet Commonly known as: Eliquis    digoxin  0.125 MG tablet Commonly known as: LANOXIN    diltiazem  240 MG 24 hr capsule Commonly known as: CARDIZEM  CD   ipratropium-albuterol  0.5-2.5 (3) MG/3ML Soln Commonly known as: DUONEB   sennosides-docusate sodium  8.6-50 MG tablet Commonly known as: SENOKOT-S   sertraline  50 MG tablet Commonly known as: ZOLOFT    Vitamin D3 50 MCG (2000 UT) Tabs       Discharge Exam: Filed Weights   11/09/23 1726  Weight: 39.5 kg   Vitals:   11/12/23 0934 11/12/23 0939  BP:    Pulse:    Resp:    Temp:    SpO2: 92% 92%   Examination: Physical Exam:  Constitutional: Thin chronically ill-appearing elderly Caucasian female who is not awake or alert and not really responsive but appears calm Respiratory: Diminished to auscultation bilaterally with some coarse breath sounds and does have some slight rhonchi and crackles.  Has a slightly increased respiratory effort and has short shallow breathing noted Cardiovascular: RRR, no murmurs / rubs / gallops. S1 and S2 auscultated. No extremity edema.  Abdomen: Soft,  non-tender, non-distended. Bowel sounds positive.  GU: Deferred. Musculoskeletal: No clubbing / cyanosis of digits/nails. No joint deformity upper and lower extremities. Neurologic: Does not follow commands given her current condition and not really responsive to physical or verbal stimuli Psychiatric: Impaired judgment and insight  Condition at discharge: Terminal   The results of significant diagnostics from this hospitalization (including imaging, microbiology, ancillary and laboratory) are listed below for reference.   Imaging Studies: ECHOCARDIOGRAM COMPLETE Result Date: 11/11/2023    ECHOCARDIOGRAM REPORT   Patient Name:   BREONIA KIRSTEIN Date of Exam: 11/11/2023 Medical Rec #:  [id number]      Height:       60.0 in Accession #:    [id number]      Weight:       87.0 lb Date of Birth:  Jan 13, 1931      BSA:          1.310 m Patient Age:    89 years       BP:           169/127 mmHg Patient Gender: F              HR:           110 bpm. Exam Location:  Inpatient Procedure: 2D Echo, Color Doppler, Cardiac Doppler and Intracardiac            Opacification Agent (Both Spectral and Color Flow Doppler were            utilized during procedure). Indications:    Atrial Fibrillation  History:        Patient has no prior history of Echocardiogram examinations and                 Patient has prior history of Echocardiogram examinations, most                 recent 07/03/2015.  Sonographer:    Travis Friedman RDCS Referring Phys: [id number] Jonel Nephew LATIF [facility] IMPRESSIONS  1. Left ventricular ejection fraction, by estimation, is 25 to 30%. The left ventricle has severely decreased function. The left ventricle demonstrates regional wall motion abnormalities. Normal basal function with mid to apical akinesis. Could represent LAD territory infarct vs Takotsubo cardiomyopathy. RV shows similar wall motion abnormality with normal basal function and apical akinesis, suspicious for Takotsubo. The left ventricular internal cavity size was severely dilated. There is mild left ventricular hypertrophy. Left ventricular diastolic parameters are indeterminate.  2. Swirling artifact at LV apex on contrast images suggesting slow flow but no clear thrombus seen  3. Right ventricular systolic function is moderately reduced. Normal basal funtion with apical akinesis. The right ventricular size is normal. There is mildly elevated pulmonary artery systolic pressure. The estimated right ventricular systolic pressure  is 42.5 mmHg.  4. Left atrial size was severely dilated.  5. The mitral valve is normal in structure. Trivial mitral valve regurgitation.  6. Tricuspid valve regurgitation is mild to moderate.  7. The aortic valve is tricuspid. Aortic valve regurgitation is trivial. Aortic valve sclerosis is  present, with no evidence of aortic valve stenosis.  8. The inferior vena cava is dilated in size with <50% respiratory variability, suggesting right atrial pressure of 15 mmHg. FINDINGS  Left Ventricle: Left ventricular ejection fraction, by estimation, is 25 to 30%. The left ventricle has severely decreased function. The left ventricle demonstrates regional wall motion abnormalities. The left ventricular internal cavity size was severely dilated. There is mild left ventricular hypertrophy. Left  ventricular diastolic parameters are indeterminate. Right Ventricle: The right ventricular size is normal. No increase in right ventricular wall thickness. Right ventricular systolic function is moderately reduced. There is mildly elevated pulmonary artery systolic pressure. The tricuspid regurgitant velocity is 2.62 m/s, and with an assumed right atrial pressure of 15 mmHg, the estimated right ventricular systolic pressure is 42.5 mmHg. Left Atrium: Left atrial size was severely dilated. Right Atrium: Right atrial size was normal in size. Pericardium: Trivial pericardial effusion is present. Mitral Valve: The mitral valve is normal in structure. Trivial mitral valve regurgitation. Tricuspid Valve: The tricuspid valve is normal in structure. Tricuspid valve regurgitation is mild to moderate. Aortic Valve: The aortic valve is tricuspid. Aortic valve regurgitation is trivial. Aortic valve sclerosis is present, with no evidence of aortic valve stenosis. Aortic valve mean gradient measures 2.2 mmHg. Aortic valve peak gradient measures 4.0 mmHg. Aortic valve area, by VTI measures 1.72 cm. Pulmonic Valve: The pulmonic valve was not well visualized. Pulmonic valve regurgitation is trivial. Aorta: The aortic root is normal in size and structure. Venous: The inferior vena cava is dilated in size with less than 50% respiratory variability, suggesting right atrial pressure of 15 mmHg. IAS/Shunts: The interatrial septum was not well  visualized.  LEFT VENTRICLE PLAX 2D LVIDd:         4.25 cm LVIDs:         3.55 cm LV PW:         0.70 cm LV IVS:        0.70 cm LVOT diam:     1.90 cm LV SV:         17 LV SV Index:   13 LVOT Area:     2.84 cm  LV Volumes (MOD) LV vol d, MOD A2C: 70.8 ml LV vol d, MOD A4C: 117.0 ml LV vol s, MOD A2C: 47.9 ml LV vol s, MOD A4C: 81.0 ml LV SV MOD A2C:     22.9 ml LV SV MOD A4C:     117.0 ml LV SV MOD BP:      28.2 ml RIGHT VENTRICLE             IVC RV Basal diam:  3.60 cm     IVC diam: 1.90 cm RV S prime:     10.00 cm/s TAPSE (M-mode): 1.3 cm LEFT ATRIUM              Index         RIGHT ATRIUM           Index LA diam:        3.70 cm  2.82 cm/m    RA Area:     15.00 cm LA Vol (A2C):   144.0 ml 109.89 ml/m  RA Volume:   36.50 ml  27.85 ml/m LA Vol (A4C):   101.0 ml 77.07 ml/m LA Biplane Vol: 129.0 ml 98.44 ml/m  AORTIC VALVE AV Area (Vmax):    1.99 cm AV Area (Vmean):   1.92 cm AV Area (VTI):     1.72 cm AV Vmax:           100.50 cm/s AV Vmean:          68.775 cm/s AV VTI:            0.100 m AV Peak Grad:      4.0 mmHg AV Mean Grad:      2.2 mmHg LVOT Vmax:         70.60 cm/s  LVOT Vmean:        46.600 cm/s LVOT VTI:          0.061 m LVOT/AV VTI ratio: 0.61  AORTA Ao Root diam: 2.70 cm TRICUSPID VALVE TR Peak grad:   27.5 mmHg TR Vmax:        262.00 cm/s  SHUNTS Systemic VTI:  0.06 m Systemic Diam: 1.90 cm Carson Clara MD Electronically signed by Carson Clara MD Signature Date/Time: 11/11/2023/5:00:44 PM    Final    DG CHEST PORT 1 VIEW Result Date: 11/11/2023 CLINICAL DATA:  [id number] SOB (shortness of breath) [id number] EXAM: PORTABLE CHEST 1 VIEW COMPARISON:  November 09, 2023, Oct 23, 2023 FINDINGS: The cardiomediastinal silhouette is unchanged in contour.Atherosclerotic calcifications of the tortuous thoracic aorta. Trace bilateral pleural effusions. No pneumothorax. Similar appearance of biapical heterogeneous opacities, likely scar. No new focal consolidation. Diffuse tree-in-bud opacities are  better visualized on recent CT. IMPRESSION: 1. Trace bilateral pleural effusions. 2. Diffuse tree-in-bud opacities are better visualized on recent CT. Electronically Signed   By: Clancy Crimes M.D.   On: 11/11/2023 07:54   CT Angio Chest PE W and/or Wo Contrast Result Date: 11/09/2023 CLINICAL DATA:  Difficulty breathing EXAM: CT ANGIOGRAPHY CHEST WITH CONTRAST TECHNIQUE: Multidetector CT imaging of the chest was performed using the standard protocol during bolus administration of intravenous contrast. Multiplanar CT image reconstructions and MIPs were obtained to evaluate the vascular anatomy. RADIATION DOSE REDUCTION: This exam was performed according to the departmental dose-optimization program which includes automated exposure control, adjustment of the mA and/or kV according to patient size and/or use of iterative reconstruction technique. CONTRAST:  65mL OMNIPAQUE IOHEXOL 350 MG/ML SOLN COMPARISON:  Chest x-ray from earlier in the same day. FINDINGS: Cardiovascular: Atherosclerotic calcifications of the thoracic aorta are noted. No aneurysmal dilatation or dissection is seen. Heart is not significantly enlarged in size. The pulmonary artery shows a normal branching pattern bilaterally. No definitive filling defect is noted. Mediastinum/Nodes: Thoracic inlet is within normal limits. No hilar or mediastinal adenopathy is noted. The esophagus as visualized is within normal limits. Lungs/Pleura: Lungs are well aerated bilaterally. No sizable effusion is seen. Diffuse tree-in-bud opacities are identified bilaterally consistent with diffuse inflammatory change. Chronic pleural and parenchymal scarring is noted in the apices stable from recent CT from 07/30/2023. No focal confluent infiltrate is seen. Upper Abdomen: Hypodensity is noted within the medial aspect of the spleen likely representing a small cyst or hemangioma. Musculoskeletal: Degenerative changes of the thoracic spine are noted. No acute rib  abnormality is seen. Review of the MIP images confirms the above findings. IMPRESSION: No evidence of pulmonary emboli. Diffuse bilateral tree in bud opacities consistent with inflammatory change. MAI deserves consideration. Chronic scarring in the apices bilaterally. Aortic Atherosclerosis (ICD10-I70.0). Electronically Signed   By: Violeta Grey M.D.   On: 11/09/2023 20:02   DG Chest Portable 1 View Result Date: 11/09/2023 CLINICAL DATA:  Shortness of breath EXAM: PORTABLE CHEST 1 VIEW COMPARISON:  10/23/2023, 07/30/2023, 04/03/2019 FINDINGS: Chronic pleuroparenchymal scarring at the apices. Evidence for chronic lung disease with diffuse coarse interstitial opacity. No acute confluent airspace disease or effusion. Stable cardiomediastinal silhouette with aortic atherosclerosis. Multiple skin fold artifact over the left chest IMPRESSION: No active disease. Evidence for chronic lung disease. Electronically Signed   By: Esmeralda Hedge M.D.   On: 11/09/2023 18:26   DG Chest Port 1 View Result Date: 10/23/2023 CLINICAL DATA:  AMS EXAM: PORTABLE CHEST - 1 VIEW COMPARISON:  07/30/2023 FINDINGS: Chronic coarse  apical interstitial opacities. No new infiltrate or overt edema. Biapical pleuroparenchymal scarring. Heart size and mediastinal contours are within normal limits. Aortic Atherosclerosis (ICD10-170.0). No effusion. Visualized bones unremarkable. IMPRESSION: Chronic apical interstitial opacities. No acute findings. Electronically Signed   By: Nicoletta Barrier M.D.   On: 10/23/2023 14:56   CT HEAD WO CONTRAST Result Date: 10/23/2023 CLINICAL DATA:  89 year old female with altered mental status, decreased responsiveness. EXAM: CT HEAD WITHOUT CONTRAST TECHNIQUE: Contiguous axial images were obtained from the base of the skull through the vertex without intravenous contrast. RADIATION DOSE REDUCTION: This exam was performed according to the departmental dose-optimization program which includes automated exposure  control, adjustment of the mA and/or kV according to patient size and/or use of iterative reconstruction technique. COMPARISON:  Head CT 07/30/2023. FINDINGS: Brain: No midline shift, ventriculomegaly, mass effect, evidence of mass lesion, intracranial hemorrhage or evidence of cortically based acute infarction. Patchy mostly periventricular bilateral cerebral white matter hypodensity. Stable gray-white matter differentiation throughout the brain. Basal ganglia vascular calcifications. Vascular: Calcified atherosclerosis at the skull base. No suspicious intracranial vascular hyperdensity. Skull: Hyperostosis of the calvarium. No acute osseous abnormality identified. Sinuses/Orbits: Visualized paranasal sinuses and mastoids are stable and well aerated. Other: Stable orbit and scalp soft tissues. IMPRESSION: 1. No acute intracranial abnormality. 2. Stable non contrast CT appearance of chronic white matter disease. Electronically Signed   By: Marlise Simpers M.D.   On: 10/23/2023 11:59   Microbiology: Results for orders placed or performed during the hospital encounter of 11/09/23  Resp panel by RT-PCR (RSV, Flu A&B, Covid) Anterior Nasal Swab     Status: None   Collection Time: 11/09/23  5:38 PM   Specimen: Anterior Nasal Swab  Result Value Ref Range Status   SARS Coronavirus 2 by RT PCR NEGATIVE NEGATIVE Final    Comment: (NOTE) SARS-CoV-2 target nucleic acids are NOT DETECTED.  The SARS-CoV-2 RNA is generally detectable in upper respiratory specimens during the acute phase of infection. The lowest concentration of SARS-CoV-2 viral copies this assay can detect is 138 copies/mL. A negative result does not preclude SARS-Cov-2 infection and should not be used as the sole basis for treatment or other patient management decisions. A negative result may occur with  improper specimen collection/handling, submission of specimen other than nasopharyngeal swab, presence of viral mutation(s) within the areas  targeted by this assay, and inadequate number of viral copies(<138 copies/mL). A negative result must be combined with clinical observations, patient history, and epidemiological information. The expected result is Negative.  Fact Sheet for Patients:  BloggerCourse.com  Fact Sheet for Healthcare Providers:  SeriousBroker.it  This test is no t yet approved or cleared by the [place]  FDA and  has been authorized for detection and/or diagnosis of SARS-CoV-2 by FDA under an Emergency Use Authorization (EUA). This EUA will remain  in effect (meaning this test can be used) for the duration of the COVID-19 declaration under Section 564(b)(1) of the Act, 21 U.S.C.section 360bbb-3(b)(1), unless the authorization is terminated  or revoked sooner.       Influenza A by PCR NEGATIVE NEGATIVE Final   Influenza B by PCR NEGATIVE NEGATIVE Final    Comment: (NOTE) The Xpert Xpress SARS-CoV-2/FLU/RSV plus assay is intended as an aid in the diagnosis of influenza from Nasopharyngeal swab specimens and should not be used as a sole basis for treatment. Nasal washings and aspirates are unacceptable for Xpert Xpress SARS-CoV-2/FLU/RSV testing.  Fact Sheet for Patients: BloggerCourse.com  Fact Sheet for Healthcare Providers: SeriousBroker.it  This test is not yet approved or cleared by the [place]  FDA and has been authorized for detection and/or diagnosis of SARS-CoV-2 by FDA under an Emergency Use Authorization (EUA). This EUA will remain in effect (meaning this test can be used) for the duration of the COVID-19 declaration under Section 564(b)(1) of the Act, 21 U.S.C. section 360bbb-3(b)(1), unless the authorization is terminated or revoked.     Resp Syncytial Virus by PCR NEGATIVE NEGATIVE Final    Comment: (NOTE) Fact Sheet for  Patients: BloggerCourse.com  Fact Sheet for Healthcare Providers: SeriousBroker.it  This test is not yet approved or cleared by the [place]  FDA and has been authorized for detection and/or diagnosis of SARS-CoV-2 by FDA under an Emergency Use Authorization (EUA). This EUA will remain in effect (meaning this test can be used) for the duration of the COVID-19 declaration under Section 564(b)(1) of the Act, 21 U.S.C. section 360bbb-3(b)(1), unless the authorization is terminated or revoked.  Performed at [facility], [address] [place], [place] [address]   Blood Culture (routine x 2)     Status: None (Preliminary result)   Collection Time: 11/09/23  5:40 PM   Specimen: BLOOD LEFT FOREARM  Result Value Ref Range Status   Specimen Description   Final    BLOOD LEFT FOREARM Performed at [facility], [address], [place], [place] [address]    Special Requests   Final    BOTTLES DRAWN AEROBIC AND ANAEROBIC Blood Culture results may not be optimal due to an inadequate volume of blood received in culture bottles Performed at [facility], [address], [place], [place] [address]    Culture   Final    NO GROWTH 2 DAYS Performed at [facility], [address] [place], [place] [address]    Report Status PENDING  Incomplete  Blood Culture (routine x 2)     Status: None (Preliminary result)   Collection Time: 11/09/23  5:43 PM   Specimen: Right Antecubital; Blood  Result Value Ref Range Status   Specimen Description   Final    RIGHT ANTECUBITAL Performed at [facility], [address] [place], [place] [address]    Special Requests   Final    BOTTLES DRAWN AEROBIC AND ANAEROBIC Blood Culture results may not be optimal due to an inadequate volume of blood received in culture bottles Performed at [facility], [address], [place], [place] [address]    Culture   Final    NO GROWTH 2 DAYS Performed at [facility], [address], [place], [place] [address]    Report Status PENDING  Incomplete  Culture, blood (routine x 2) Call MD if unable to obtain prior to antibiotics being given     Status: None (Preliminary result)   Collection Time: 11/10/23 12:41 AM   Specimen: BLOOD  Result Value Ref Range Status   Specimen Description   Final    BLOOD BLOOD LEFT ARM AEROBIC BOTTLE ONLY Performed at [facility], [address], [place], [place] [address]    Special Requests   Final    Blood Culture results may not be optimal due to an inadequate volume of blood received in culture bottles Performed at [facility], [address] [place], [place] [address]    Culture   Final    NO GROWTH 2 DAYS Performed at [facility], [address], [place], [place] [address]  Report Status PENDING  Incomplete  Culture, blood (routine x 2) Call MD if unable to obtain prior to antibiotics being given     Status: None (Preliminary result)   Collection Time: 11/10/23 12:41 AM   Specimen: BLOOD  Result Value Ref Range Status   Specimen Description   Final    BLOOD BLOOD RIGHT HAND Performed at [facility], [address] [place], [place] [address]    Special Requests   Final    BOTTLES DRAWN AEROBIC AND ANAEROBIC Blood Culture adequate volume Performed at [facility], [address] [place], [place] [address]    Culture   Final    NO GROWTH 2 DAYS Performed at [facility], [address], [place], [place] [address]    Report Status PENDING  Incomplete  Respiratory (~20 pathogens) panel by PCR     Status: Abnormal   Collection Time: 11/10/23 12:23 PM   Specimen: Nasopharyngeal Swab; Respiratory  Result Value Ref Range Status   Adenovirus NOT DETECTED NOT DETECTED Final   Coronavirus 229E NOT DETECTED  NOT DETECTED Final    Comment: (NOTE) The Coronavirus on the Respiratory Panel, DOES NOT test for the novel  Coronavirus (2019 nCoV)    Coronavirus HKU1 NOT DETECTED NOT DETECTED Final   Coronavirus NL63 NOT DETECTED NOT DETECTED Final   Coronavirus OC43 NOT DETECTED NOT DETECTED Final   Metapneumovirus NOT DETECTED NOT DETECTED Final   Rhinovirus / Enterovirus NOT DETECTED NOT DETECTED Final   Influenza A NOT DETECTED NOT DETECTED Final   Influenza B NOT DETECTED NOT DETECTED Final   Parainfluenza Virus 1 NOT DETECTED NOT DETECTED Final   Parainfluenza Virus 2 NOT DETECTED NOT DETECTED Final   Parainfluenza Virus 3 DETECTED (A) NOT DETECTED Final   Parainfluenza Virus 4 NOT DETECTED NOT DETECTED Final   Respiratory Syncytial Virus NOT DETECTED NOT DETECTED Final   Bordetella pertussis NOT DETECTED NOT DETECTED Final   Bordetella Parapertussis NOT DETECTED NOT DETECTED Final   Chlamydophila pneumoniae NOT DETECTED NOT DETECTED Final   Mycoplasma pneumoniae NOT DETECTED NOT DETECTED Final    Comment: Performed at [facility], [address] [place], [place] [address]   Labs: CBC: Recent Labs  Lab 11/09/23 1738 11/10/23 0041 11/10/23 0834 11/11/23 0524  WBC 11.0* 5.6 5.0 6.1  NEUTROABS 9.8*  --  4.6 5.6  HGB 12.4 10.9* 10.6* 10.3*  HCT 37.1 33.9* 34.9* 34.0*  MCV 92.3 92.6 94.8 96.0  PLT 211 147* 146* 163   Basic Metabolic Panel: Recent Labs  Lab 11/09/23 1738 11/10/23 0041 11/10/23 0834 11/11/23 0524  NA 138 139 138 142  K 3.6 3.2* 3.1* 3.5  CL 95* 99 99 106  CO2 27 29 29 29   GLUCOSE 143* 138* 107* 121*  BUN 14 13 13 14   CREATININE 0.62 0.60 0.40* 0.39*  CALCIUM 9.5 8.5* 7.9* 8.1*  MG  --   --   --  1.9  PHOS  --   --   --  2.3*   Liver Function Tests: Recent Labs  Lab 11/09/23 1738 11/10/23 0041 11/10/23 0834 11/11/23 0524  AST 30 20 19 18   ALT 9 11 11 11   ALKPHOS 98 65 64 56  BILITOT 0.5 0.9 0.8 0.7  PROT 7.0 5.8* 5.6* 5.3*  ALBUMIN 3.7  2.9* 2.6* 2.5*   CBG: Recent Labs  Lab 11/11/23 1117 11/11/23 1608 11/11/23 1640 11/11/23 2012 11/12/23 0750  GLUCAP 137* 54* 233* 131*  65*   Discharge time spent: greater than 30 minutes.  Signed: Aura Leeds, DO [facility] 11/12/2023

## 2023-11-15 LAB — CULTURE, BLOOD (ROUTINE X 2)
Culture: NO GROWTH
Culture: NO GROWTH
Culture: NO GROWTH
Culture: NO GROWTH
Special Requests: ADEQUATE

## 2023-11-27 DEATH — deceased

## 2024-02-18 NOTE — Progress Notes (Signed)
 [name]
